# Patient Record
Sex: Male | Born: 1955 | Race: White | Hispanic: No | Marital: Married | Smoking: Former smoker
Health system: Southern US, Community
[De-identification: ages and names within clinical notes are randomized; demographics above are authoritative.]

## PROBLEM LIST (undated history)

## (undated) DIAGNOSIS — G4733 Obstructive sleep apnea (adult) (pediatric): Secondary | ICD-10-CM

## (undated) DIAGNOSIS — Z8719 Personal history of other diseases of the digestive system: Secondary | ICD-10-CM

## (undated) DIAGNOSIS — K219 Gastro-esophageal reflux disease without esophagitis: Secondary | ICD-10-CM

## (undated) DIAGNOSIS — H269 Unspecified cataract: Secondary | ICD-10-CM

## (undated) DIAGNOSIS — F32A Depression, unspecified: Secondary | ICD-10-CM

## (undated) DIAGNOSIS — M51369 Other intervertebral disc degeneration, lumbar region without mention of lumbar back pain or lower extremity pain: Secondary | ICD-10-CM

## (undated) DIAGNOSIS — G43909 Migraine, unspecified, not intractable, without status migrainosus: Secondary | ICD-10-CM

## (undated) DIAGNOSIS — E785 Hyperlipidemia, unspecified: Secondary | ICD-10-CM

## (undated) DIAGNOSIS — I719 Aortic aneurysm of unspecified site, without rupture: Secondary | ICD-10-CM

## (undated) DIAGNOSIS — M5136 Other intervertebral disc degeneration, lumbar region: Secondary | ICD-10-CM

## (undated) DIAGNOSIS — M1990 Unspecified osteoarthritis, unspecified site: Secondary | ICD-10-CM

## (undated) DIAGNOSIS — N329 Bladder disorder, unspecified: Secondary | ICD-10-CM

## (undated) DIAGNOSIS — G4730 Sleep apnea, unspecified: Secondary | ICD-10-CM

## (undated) DIAGNOSIS — M25569 Pain in unspecified knee: Secondary | ICD-10-CM

## (undated) DIAGNOSIS — I10 Essential (primary) hypertension: Secondary | ICD-10-CM

## (undated) DIAGNOSIS — M5126 Other intervertebral disc displacement, lumbar region: Secondary | ICD-10-CM

## (undated) DIAGNOSIS — D649 Anemia, unspecified: Secondary | ICD-10-CM

## (undated) DIAGNOSIS — F419 Anxiety disorder, unspecified: Secondary | ICD-10-CM

## (undated) DIAGNOSIS — R739 Hyperglycemia, unspecified: Secondary | ICD-10-CM

## (undated) HISTORY — DX: Depression, unspecified: F32.A

## (undated) HISTORY — DX: Sleep apnea, unspecified: G47.30

## (undated) HISTORY — DX: Unspecified osteoarthritis, unspecified site: M19.90

## (undated) HISTORY — DX: Hyperglycemia, unspecified: R73.9

## (undated) HISTORY — DX: Obstructive sleep apnea (adult) (pediatric): G47.33

## (undated) HISTORY — PX: ESOPHAGOGASTRODUODENOSCOPY: SHX1529

## (undated) HISTORY — DX: Gastro-esophageal reflux disease without esophagitis: K21.9

## (undated) HISTORY — DX: Aortic aneurysm of unspecified site, without rupture: I71.9

## (undated) HISTORY — DX: Unspecified cataract: H26.9

## (undated) HISTORY — DX: Anxiety disorder, unspecified: F41.9

## (undated) HISTORY — PX: SPINE SURGERY: SHX786

## (undated) HISTORY — DX: Anemia, unspecified: D64.9

## (undated) HISTORY — PX: ESOPHAGOGASTRODUODENOSCOPY (EGD) WITH ESOPHAGEAL DILATION: SHX5812

## (undated) HISTORY — PX: NASAL SEPTUM SURGERY: SHX37

## (undated) HISTORY — PX: 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): SHX6458

## (undated) HISTORY — PX: JOINT REPLACEMENT: SHX530

---

## 1993-08-23 HISTORY — PX: KNEE ARTHROSCOPY: SHX127

## 2001-09-16 LAB — PULMONARY FUNCTION TEST

## 2011-09-30 ENCOUNTER — Other Ambulatory Visit: Payer: Self-pay | Admitting: Orthopedic Surgery

## 2011-10-01 ENCOUNTER — Encounter (HOSPITAL_COMMUNITY): Payer: Self-pay

## 2011-10-01 ENCOUNTER — Encounter (HOSPITAL_COMMUNITY): Payer: Self-pay | Admitting: Pharmacy Technician

## 2011-10-01 ENCOUNTER — Encounter (HOSPITAL_COMMUNITY)
Admission: RE | Admit: 2011-10-01 | Discharge: 2011-10-01 | Disposition: A | Discharge status: Home / Self Care | Payer: BC Managed Care – PPO | Source: Ambulatory Visit | Attending: Orthopedic Surgery | Admitting: Orthopedic Surgery

## 2011-10-01 HISTORY — DX: Hyperlipidemia, unspecified: E78.5

## 2011-10-01 HISTORY — DX: Pain in unspecified knee: M25.569

## 2011-10-01 HISTORY — DX: Essential (primary) hypertension: I10

## 2011-10-01 LAB — URINALYSIS, ROUTINE W REFLEX MICROSCOPIC
Bilirubin Urine: NEGATIVE
Glucose, UA: NEGATIVE mg/dL
Hgb urine dipstick: NEGATIVE
Ketones, ur: NEGATIVE mg/dL
Specific Gravity, Urine: 1.02 (ref 1.005–1.030)
pH: 5 (ref 5.0–8.0)

## 2011-10-01 LAB — CBC
HCT: 39.9 % (ref 39.0–52.0)
Hemoglobin: 13.5 g/dL (ref 13.0–17.0)
MCHC: 33.8 g/dL (ref 30.0–36.0)
RDW: 13.2 % (ref 11.5–15.5)
WBC: 8.8 10*3/uL (ref 4.0–10.5)

## 2011-10-01 LAB — DIFFERENTIAL
Basophils Relative: 1 % (ref 0–1)
Eosinophils Absolute: 0.3 10*3/uL (ref 0.0–0.7)
Eosinophils Relative: 3 % (ref 0–5)
Lymphs Abs: 2.8 10*3/uL (ref 0.7–4.0)
Monocytes Relative: 8 % (ref 3–12)
Neutrophils Relative %: 57 % (ref 43–77)

## 2011-10-01 LAB — COMPREHENSIVE METABOLIC PANEL
ALT: 21 U/L (ref 0–53)
Albumin: 3.6 g/dL (ref 3.5–5.2)
Alkaline Phosphatase: 60 U/L (ref 39–117)
BUN: 19 mg/dL (ref 6–23)
Chloride: 107 mEq/L (ref 96–112)
Glucose, Bld: 93 mg/dL (ref 70–99)
Potassium: 4 mEq/L (ref 3.5–5.1)
Sodium: 142 mEq/L (ref 135–145)
Total Bilirubin: 0.2 mg/dL — ABNORMAL LOW (ref 0.3–1.2)
Total Protein: 6.6 g/dL (ref 6.0–8.3)

## 2011-10-01 LAB — SURGICAL PCR SCREEN: MRSA, PCR: NEGATIVE

## 2011-10-01 LAB — APTT: aPTT: 30 seconds (ref 24–37)

## 2011-10-01 MED ORDER — CHLORHEXIDINE GLUCONATE 4 % EX LIQD: 60.0000 mL | Freq: Once | CUTANEOUS | Status: DC

## 2011-10-01 NOTE — Pre-Procedure Instructions (Signed)
20 Jeremy [name]  10/01/2011   Your procedure is scheduled on:  Oct 11, 2011 (Monday)  Report to [facility] at 0645 AM.  Call this number if you have problems the morning of surgery: [phone]   Remember:   Do not eat food:After Midnight.  May have clear liquids: up to 4 Hours before arrival.  Clear liquids include soda, tea, black coffee, apple or grape juice, broth.  Take these medicines the morning of surgery with A SIP OF WATER: allegra   Do not wear jewelry, make-up or nail polish.  Do not wear lotions, powders, or perfumes. You may wear deodorant.  Do not shave 48 hours prior to surgery.  Do not bring valuables to the hospital.  Contacts, dentures or bridgework may not be worn into surgery.  Leave suitcase in the car. After surgery it may be brought to your room.  For patients admitted to the hospital, checkout time is 11:00 AM the day of discharge.   Patients discharged the day of surgery will not be allowed to drive home.  Name and phone number of your driver: NA  Special Instructions: Incentive Spirometry - Practice and bring it with you on the day of surgery. and CHG Shower Use Special Wash: 1/2 bottle night before surgery and 1/2 bottle morning of surgery.   Please read over the following fact sheets that you were given: Pain Booklet, Blood Transfusion Information, Total Joint Packet, MRSA Information and Surgical Site Infection Prevention

## 2011-10-02 LAB — URINE CULTURE
Colony Count: NO GROWTH
Culture  Setup Time: 201302082127
Culture: NO GROWTH

## 2011-10-04 NOTE — Progress Notes (Signed)
10/04/11    0740  Inside chart there are : 1)  1 view chest xray  Dated 530/2012   2) Gated Spect rest/stress test dated 01/21/2011     3) EKG 01/20/2011     4) Nuclear Cardiac Imaging  01/21/2011            Will need  2  V  CXR    DOS               DA

## 2011-10-06 ENCOUNTER — Other Ambulatory Visit: Payer: Self-pay | Admitting: Orthopedic Surgery

## 2011-10-06 NOTE — H&P (Signed)
  Jeremy [name] MRN:  [id number] DOB/SEX:  07/20/1956/male  CHIEF COMPLAINT:  Painful right Knee  HISTORY: Patient is a 56 y.o. male presented with a history of pain in the right knee. Onset of symptoms was gradual starting several years ago with gradually worsening course since that time. The patient noted no past surgery on the right knee. Prior procedures on the knee include meniscectomy. Patient has been treated conservatively with over-the-counter NSAIDs and activity modification. Patient currently rates pain in the knee at 8 out of 10 with activity. There is pain at night.  PAST MEDICAL HISTORY: There are no active problems to display for this patient.  Past Medical History  Diagnosis Date  . Hypertension   . Hyperlipemia   . Joint pain, knee   . Nocturia    Past Surgical History  Procedure Date  . Nose surgery   . Knee arthroscopy 1995  . Cardiac catheterization      MEDICATIONS:   (Not in a hospital admission)  ALLERGIES:  No Known Allergies  REVIEW OF SYSTEMS:  Pertinent items are noted in HPI.   FAMILY HISTORY:  No family history on file.  SOCIAL HISTORY:   History  Substance Use Topics  . Smoking status: Former Smoker  . Smokeless tobacco: Not on file  . Alcohol Use: No     EXAMINATION:  Vital signs in last 24 hours: @VSRANGES@  General appearance: alert, cooperative and no distress Lungs: clear to auscultation bilaterally Heart: regular rate and rhythm, S1, S2 normal, no murmur, click, rub or gallop Abdomen: soft, non-tender; bowel sounds normal; no masses,  no organomegaly Extremities: extremities normal, atraumatic, no cyanosis or edema and Homans sign is negative, no sign of DVT Pulses: 2+ and symmetric Skin: Skin color, texture, turgor normal. No rashes or lesions Neurologic: Alert and oriented X 3, normal strength and tone. Normal symmetric reflexes. Normal coordination and gait  Musculoskeletal:  ROM 0-115, Ligaments INTACT,  Imaging  Review Plain radiographs demonstrate severe degenerative joint disease of the right knee. The overall alignment is mild varus. The bone quality appears to be good for age and reported activity level.  Assessment/Plan: End stage arthritis, right knee   The patient history, physical examination and imaging studies are consistent with advanced degenerative joint disease of the right knee. The patient has failed conservative treatment.  The clearance notes were reviewed.  After discussion with the patient it was felt that Total Knee Replacement was indicated. The procedure,  risks, and benefits of total knee arthroplasty were presented and reviewed. The risks including but not limited to aseptic loosening, infection, blood clots, vascular injury, stiffness, patella tracking problems complications among others were discussed. The patient acknowledged the explanation, agreed to proceed with the plan.  Jeremy [name] 10/06/2011, 9:32 AM   

## 2011-10-10 MED ORDER — CEFAZOLIN SODIUM-DEXTROSE 2-3 GM-% IV SOLR
2.0000 g | INTRAVENOUS | Status: AC
  Administered 2011-10-11: 2 g via INTRAVENOUS
  Filled 2011-10-10: qty 50

## 2011-10-10 MED ORDER — ACETAMINOPHEN 10 MG/ML IV SOLN
1000.0000 mg | Freq: Four times a day (QID) | INTRAVENOUS | Status: DC
  Administered 2011-10-11: 1000 mg via INTRAVENOUS
  Filled 2011-10-10 (×4): qty 100

## 2011-10-11 ENCOUNTER — Encounter (HOSPITAL_COMMUNITY): Payer: Self-pay | Admitting: Anesthesiology

## 2011-10-11 ENCOUNTER — Inpatient Hospital Stay (HOSPITAL_COMMUNITY)
Admission: RE | Admit: 2011-10-11 | Discharge: 2011-10-13 | DRG: 209 | Disposition: A | Discharge status: Home / Self Care | Payer: BC Managed Care – PPO | Source: Ambulatory Visit | Attending: Orthopedic Surgery | Admitting: Orthopedic Surgery

## 2011-10-11 ENCOUNTER — Ambulatory Visit (HOSPITAL_COMMUNITY): Payer: BC Managed Care – PPO

## 2011-10-11 ENCOUNTER — Encounter (HOSPITAL_COMMUNITY)
Admission: RE | Disposition: A | Discharge status: Home / Self Care | Payer: Self-pay | Source: Ambulatory Visit | Attending: Orthopedic Surgery

## 2011-10-11 ENCOUNTER — Encounter (HOSPITAL_COMMUNITY): Payer: Self-pay

## 2011-10-11 ENCOUNTER — Ambulatory Visit (HOSPITAL_COMMUNITY): Payer: BC Managed Care – PPO | Admitting: Anesthesiology

## 2011-10-11 DIAGNOSIS — D62 Acute posthemorrhagic anemia: Secondary | ICD-10-CM | POA: Diagnosis not present

## 2011-10-11 DIAGNOSIS — Z87891 Personal history of nicotine dependence: Secondary | ICD-10-CM

## 2011-10-11 DIAGNOSIS — M1710 Unilateral primary osteoarthritis, unspecified knee: Principal | ICD-10-CM | POA: Diagnosis present

## 2011-10-11 DIAGNOSIS — M1711 Unilateral primary osteoarthritis, right knee: Secondary | ICD-10-CM

## 2011-10-11 DIAGNOSIS — I10 Essential (primary) hypertension: Secondary | ICD-10-CM | POA: Diagnosis present

## 2011-10-11 HISTORY — PX: TOTAL KNEE ARTHROPLASTY: SHX125

## 2011-10-11 LAB — ABO/RH: ABO/RH(D): O POS

## 2011-10-11 LAB — TYPE AND SCREEN: Antibody Screen: NEGATIVE

## 2011-10-11 SURGERY — ARTHROPLASTY, KNEE, TOTAL
Anesthesia: Regional | Site: Knee | Laterality: Right | Wound class: Clean

## 2011-10-11 MED ORDER — SENNOSIDES-DOCUSATE SODIUM 8.6-50 MG PO TABS: 1.0000 | ORAL_TABLET | Freq: Every evening | ORAL | Status: DC | PRN

## 2011-10-11 MED ORDER — AMLODIPINE BESYLATE 5 MG PO TABS
5.0000 mg | ORAL_TABLET | Freq: Every day | ORAL | Status: DC
  Administered 2011-10-11 – 2011-10-13 (×3): 5 mg via ORAL
  Filled 2011-10-11 (×3): qty 1

## 2011-10-11 MED ORDER — FENTANYL CITRATE 0.05 MG/ML IJ SOLN
INTRAMUSCULAR | Status: DC | PRN
  Administered 2011-10-11 (×2): 25 ug via INTRAVENOUS
  Administered 2011-10-11 (×2): 100 ug via INTRAVENOUS
  Administered 2011-10-11: 25 ug via INTRAVENOUS
  Administered 2011-10-11: 75 ug via INTRAVENOUS
  Administered 2011-10-11 (×2): 25 ug via INTRAVENOUS

## 2011-10-11 MED ORDER — METHOCARBAMOL 100 MG/ML IJ SOLN
500.0000 mg | Freq: Four times a day (QID) | INTRAVENOUS | Status: DC | PRN
  Filled 2011-10-11: qty 5

## 2011-10-11 MED ORDER — MENTHOL 3 MG MT LOZG: 1.0000 | LOZENGE | OROMUCOSAL | Status: DC | PRN

## 2011-10-11 MED ORDER — SODIUM CHLORIDE 0.9 % IR SOLN
Status: DC | PRN
  Administered 2011-10-11: 1000 mL

## 2011-10-11 MED ORDER — FLEET ENEMA 7-19 GM/118ML RE ENEM: 1.0000 | ENEMA | Freq: Once | RECTAL | Status: AC | PRN

## 2011-10-11 MED ORDER — METOCLOPRAMIDE HCL 10 MG PO TABS: 5.0000 mg | ORAL_TABLET | Freq: Three times a day (TID) | ORAL | Status: DC | PRN

## 2011-10-11 MED ORDER — PHENOL 1.4 % MT LIQD
1.0000 | OROMUCOSAL | Status: DC | PRN
  Filled 2011-10-11: qty 177

## 2011-10-11 MED ORDER — BISACODYL 5 MG PO TBEC: 5.0000 mg | DELAYED_RELEASE_TABLET | Freq: Every day | ORAL | Status: DC | PRN

## 2011-10-11 MED ORDER — HYDROMORPHONE HCL PF 1 MG/ML IJ SOLN
0.5000 mg | INTRAMUSCULAR | Status: DC | PRN
  Administered 2011-10-11 – 2011-10-13 (×10): 1 mg via INTRAVENOUS
  Filled 2011-10-11 (×11): qty 1

## 2011-10-11 MED ORDER — ALUM & MAG HYDROXIDE-SIMETH 200-200-20 MG/5ML PO SUSP: 30.0000 mL | ORAL | Status: DC | PRN

## 2011-10-11 MED ORDER — METOCLOPRAMIDE HCL 5 MG/ML IJ SOLN
5.0000 mg | Freq: Three times a day (TID) | INTRAMUSCULAR | Status: DC | PRN
  Filled 2011-10-11: qty 2

## 2011-10-11 MED ORDER — ONDANSETRON HCL 4 MG/2ML IJ SOLN
4.0000 mg | Freq: Four times a day (QID) | INTRAMUSCULAR | Status: DC | PRN
  Administered 2011-10-11 – 2011-10-12 (×2): 4 mg via INTRAVENOUS
  Filled 2011-10-11 (×2): qty 2

## 2011-10-11 MED ORDER — ZOLPIDEM TARTRATE 5 MG PO TABS: 5.0000 mg | ORAL_TABLET | Freq: Every evening | ORAL | Status: DC | PRN

## 2011-10-11 MED ORDER — ACETAMINOPHEN 10 MG/ML IV SOLN
INTRAVENOUS | Status: AC
  Filled 2011-10-11: qty 100

## 2011-10-11 MED ORDER — HYDROMORPHONE HCL PF 1 MG/ML IJ SOLN
INTRAMUSCULAR | Status: AC
  Administered 2011-10-11: 1 mg via INTRAVENOUS
  Filled 2011-10-11: qty 1

## 2011-10-11 MED ORDER — BUPIVACAINE ON-Q PAIN PUMP (FOR ORDER SET NO CHG)
INJECTION | Status: DC
  Filled 2011-10-11: qty 1

## 2011-10-11 MED ORDER — BUPIVACAINE-EPINEPHRINE 0.25% -1:200000 IJ SOLN
INTRAMUSCULAR | Status: DC | PRN
  Administered 2011-10-11: 30 mL

## 2011-10-11 MED ORDER — ONDANSETRON HCL 4 MG PO TABS: 4.0000 mg | ORAL_TABLET | Freq: Four times a day (QID) | ORAL | Status: DC | PRN

## 2011-10-11 MED ORDER — LACTATED RINGERS IV SOLN
INTRAVENOUS | Status: DC | PRN
  Administered 2011-10-11 (×2): via INTRAVENOUS

## 2011-10-11 MED ORDER — ACETAMINOPHEN 650 MG RE SUPP: 650.0000 mg | Freq: Four times a day (QID) | RECTAL | Status: DC | PRN

## 2011-10-11 MED ORDER — SODIUM CHLORIDE 0.9 % IV SOLN: INTRAVENOUS | Status: DC

## 2011-10-11 MED ORDER — BUPIVACAINE 0.25 % ON-Q PUMP SINGLE CATH 300ML
300.0000 mL | INJECTION | Status: AC
  Administered 2011-10-11: 300 mL
  Filled 2011-10-11: qty 300

## 2011-10-11 MED ORDER — AMLODIPINE BESY-BENAZEPRIL HCL 5-20 MG PO CAPS: 1.0000 | ORAL_CAPSULE | Freq: Every day | ORAL | Status: DC

## 2011-10-11 MED ORDER — ONDANSETRON HCL 4 MG/2ML IJ SOLN
INTRAMUSCULAR | Status: DC | PRN
  Administered 2011-10-11: 4 mg via INTRAVENOUS

## 2011-10-11 MED ORDER — CELECOXIB 200 MG PO CAPS
200.0000 mg | ORAL_CAPSULE | Freq: Two times a day (BID) | ORAL | Status: DC
  Administered 2011-10-11 – 2011-10-13 (×5): 200 mg via ORAL
  Filled 2011-10-11 (×6): qty 1

## 2011-10-11 MED ORDER — DROPERIDOL 2.5 MG/ML IJ SOLN: 0.6250 mg | INTRAMUSCULAR | Status: DC | PRN

## 2011-10-11 MED ORDER — ACETAMINOPHEN 325 MG PO TABS: 650.0000 mg | ORAL_TABLET | Freq: Four times a day (QID) | ORAL | Status: DC | PRN

## 2011-10-11 MED ORDER — LORATADINE 10 MG PO TABS
10.0000 mg | ORAL_TABLET | Freq: Every day | ORAL | Status: DC
  Administered 2011-10-11 – 2011-10-13 (×3): 10 mg via ORAL
  Filled 2011-10-11 (×3): qty 1

## 2011-10-11 MED ORDER — METHOCARBAMOL 500 MG PO TABS
500.0000 mg | ORAL_TABLET | Freq: Four times a day (QID) | ORAL | Status: DC | PRN
  Administered 2011-10-11 – 2011-10-13 (×6): 500 mg via ORAL
  Filled 2011-10-11 (×6): qty 1

## 2011-10-11 MED ORDER — OXYCODONE HCL 5 MG PO TABS
5.0000 mg | ORAL_TABLET | ORAL | Status: DC | PRN
  Administered 2011-10-12 – 2011-10-13 (×5): 10 mg via ORAL
  Filled 2011-10-11 (×5): qty 2

## 2011-10-11 MED ORDER — DOCUSATE SODIUM 100 MG PO CAPS
100.0000 mg | ORAL_CAPSULE | Freq: Two times a day (BID) | ORAL | Status: DC
  Administered 2011-10-11 – 2011-10-13 (×5): 100 mg via ORAL
  Filled 2011-10-11 (×6): qty 1

## 2011-10-11 MED ORDER — OXYCODONE HCL 10 MG PO TB12
10.0000 mg | ORAL_TABLET | Freq: Two times a day (BID) | ORAL | Status: DC
  Administered 2011-10-11 (×2): 10 mg via ORAL
  Filled 2011-10-11 (×2): qty 1

## 2011-10-11 MED ORDER — ENOXAPARIN SODIUM 40 MG/0.4ML ~~LOC~~ SOLN
40.0000 mg | SUBCUTANEOUS | Status: DC
  Administered 2011-10-12 (×2): 40 mg via SUBCUTANEOUS
  Filled 2011-10-11 (×3): qty 0.4

## 2011-10-11 MED ORDER — MIDAZOLAM HCL 5 MG/5ML IJ SOLN
INTRAMUSCULAR | Status: DC | PRN
  Administered 2011-10-11: 2 mg via INTRAVENOUS

## 2011-10-11 MED ORDER — HYDROCHLOROTHIAZIDE 12.5 MG PO CAPS
12.5000 mg | ORAL_CAPSULE | Freq: Every day | ORAL | Status: DC
Start: 2011-10-11 — End: 2011-10-13
  Administered 2011-10-11 – 2011-10-13 (×3): 12.5 mg via ORAL
  Filled 2011-10-11 (×3): qty 1

## 2011-10-11 MED ORDER — HYDROMORPHONE HCL PF 1 MG/ML IJ SOLN
0.2500 mg | INTRAMUSCULAR | Status: DC | PRN
  Administered 2011-10-11 (×4): 0.5 mg via INTRAVENOUS

## 2011-10-11 MED ORDER — BENAZEPRIL HCL 20 MG PO TABS
20.0000 mg | ORAL_TABLET | Freq: Every day | ORAL | Status: DC
  Administered 2011-10-11 – 2011-10-13 (×3): 20 mg via ORAL
  Filled 2011-10-11 (×3): qty 1

## 2011-10-11 MED ORDER — DIPHENHYDRAMINE HCL 12.5 MG/5ML PO ELIX
12.5000 mg | ORAL_SOLUTION | ORAL | Status: DC | PRN
  Filled 2011-10-11: qty 10

## 2011-10-11 MED ORDER — CEFAZOLIN SODIUM-DEXTROSE 2-3 GM-% IV SOLR
2.0000 g | Freq: Four times a day (QID) | INTRAVENOUS | Status: AC
  Administered 2011-10-11 – 2011-10-12 (×3): 2 g via INTRAVENOUS
  Filled 2011-10-11 (×3): qty 50

## 2011-10-11 MED ORDER — PROPOFOL 10 MG/ML IV EMUL
INTRAVENOUS | Status: DC | PRN
  Administered 2011-10-11: 200 mg via INTRAVENOUS

## 2011-10-11 SURGICAL SUPPLY — 59 items
BANDAGE ESMARK 6X9 LF (GAUZE/BANDAGES/DRESSINGS) ×1 IMPLANT
BLADE SAGITTAL 13X1.27X60 (BLADE) ×2 IMPLANT
BLADE SAW SGTL 83.5X18.5 (BLADE) ×2 IMPLANT
BNDG ESMARK 6X9 LF (GAUZE/BANDAGES/DRESSINGS) ×2
BOWL SMART MIX CTS (DISPOSABLE) ×2 IMPLANT
CATH KIT ON Q 10IN SLV (PAIN MANAGEMENT) IMPLANT
CEMENT BONE SIMPLEX SPEEDSET (Cement) ×2 IMPLANT
CLOSURE STERI STRIP 1/2 X4 (GAUZE/BANDAGES/DRESSINGS) ×2 IMPLANT
CLOTH BEACON ORANGE TIMEOUT ST (SAFETY) ×2 IMPLANT
COVER BACK TABLE 24X17X13 BIG (DRAPES) ×2 IMPLANT
COVER SURGICAL LIGHT HANDLE (MISCELLANEOUS) ×2 IMPLANT
CUFF TOURNIQUET SINGLE 34IN LL (TOURNIQUET CUFF) ×2 IMPLANT
DRAPE EXTREMITY T 121X128X90 (DRAPE) ×2 IMPLANT
DRAPE INCISE IOBAN 66X45 STRL (DRAPES) ×4 IMPLANT
DRAPE PROXIMA HALF (DRAPES) ×4 IMPLANT
DRAPE U-SHAPE 47X51 STRL (DRAPES) ×2 IMPLANT
DRSG ADAPTIC 3X8 NADH LF (GAUZE/BANDAGES/DRESSINGS) ×2 IMPLANT
DRSG PAD ABDOMINAL 8X10 ST (GAUZE/BANDAGES/DRESSINGS) ×2 IMPLANT
DURAPREP 26ML APPLICATOR (WOUND CARE) ×4 IMPLANT
ELECT REM PT RETURN 9FT ADLT (ELECTROSURGICAL) ×2
ELECTRODE REM PT RTRN 9FT ADLT (ELECTROSURGICAL) ×1 IMPLANT
EVACUATOR 1/8 PVC DRAIN (DRAIN) ×2 IMPLANT
GLOVE BIOGEL M 7.0 STRL (GLOVE) ×2 IMPLANT
GLOVE BIOGEL PI IND STRL 7.5 (GLOVE) ×1 IMPLANT
GLOVE BIOGEL PI IND STRL 8.5 (GLOVE) ×2 IMPLANT
GLOVE BIOGEL PI INDICATOR 7.5 (GLOVE) ×1
GLOVE BIOGEL PI INDICATOR 8.5 (GLOVE) ×2
GLOVE SURG ORTHO 8.0 STRL STRW (GLOVE) ×4 IMPLANT
GOWN PREVENTION PLUS XLARGE (GOWN DISPOSABLE) ×4 IMPLANT
GOWN STRL NON-REIN LRG LVL3 (GOWN DISPOSABLE) ×4 IMPLANT
HANDPIECE INTERPULSE COAX TIP (DISPOSABLE) ×1
HOOD PEEL AWAY FACE SHEILD DIS (HOOD) ×8 IMPLANT
KIT BASIN OR (CUSTOM PROCEDURE TRAY) ×2 IMPLANT
KIT ROOM TURNOVER OR (KITS) ×2 IMPLANT
MANIFOLD NEPTUNE II (INSTRUMENTS) ×2 IMPLANT
NEEDLE 22X1 1/2 (OR ONLY) (NEEDLE) ×2 IMPLANT
NS IRRIG 1000ML POUR BTL (IV SOLUTION) ×2 IMPLANT
ON Q SILVER SOAKER 5IN ×2 IMPLANT
PACK TOTAL JOINT (CUSTOM PROCEDURE TRAY) ×2 IMPLANT
PAD ARMBOARD 7.5X6 YLW CONV (MISCELLANEOUS) ×4 IMPLANT
PADDING CAST COTTON 6X4 STRL (CAST SUPPLIES) ×2 IMPLANT
PADDING WEBRIL 6 STERILE (GAUZE/BANDAGES/DRESSINGS) ×2 IMPLANT
POSITIONER HEAD PRONE TRACH (MISCELLANEOUS) ×2 IMPLANT
SET HNDPC FAN SPRY TIP SCT (DISPOSABLE) ×1 IMPLANT
SPONGE GAUZE 4X4 12PLY (GAUZE/BANDAGES/DRESSINGS) ×2 IMPLANT
SPONGE GAUZE 4X4 STERILE 39 (GAUZE/BANDAGES/DRESSINGS) ×2 IMPLANT
STAPLER VISISTAT 35W (STAPLE) ×2 IMPLANT
SUCTION FRAZIER TIP 10 FR DISP (SUCTIONS) ×2 IMPLANT
SUT BONE WAX W31G (SUTURE) ×2 IMPLANT
SUT VIC AB 0 CTB1 27 (SUTURE) ×4 IMPLANT
SUT VIC AB 1 CT1 27 (SUTURE) ×4
SUT VIC AB 1 CT1 27XBRD ANBCTR (SUTURE) ×4 IMPLANT
SUT VIC AB 2-0 CT1 27 (SUTURE) ×2
SUT VIC AB 2-0 CT1 TAPERPNT 27 (SUTURE) ×2 IMPLANT
SYR CONTROL 10ML LL (SYRINGE) ×2 IMPLANT
TOWEL OR 17X24 6PK STRL BLUE (TOWEL DISPOSABLE) ×2 IMPLANT
TOWEL OR 17X26 10 PK STRL BLUE (TOWEL DISPOSABLE) ×2 IMPLANT
TRAY FOLEY CATH 14FR (SET/KITS/TRAYS/PACK) IMPLANT
WATER STERILE IRR 1000ML POUR (IV SOLUTION) ×2 IMPLANT

## 2011-10-11 NOTE — H&P (View-Only) (Signed)
  Jeremy [name] MRN:  [id number] DOB/SEX:  [birth year]/male  CHIEF COMPLAINT:  Painful right Knee  HISTORY: Patient is a 56 y.o. male presented with a history of pain in the right knee. Onset of symptoms was gradual starting several years ago with gradually worsening course since that time. The patient noted no past surgery on the right knee. Prior procedures on the knee include meniscectomy. Patient has been treated conservatively with over-the-counter NSAIDs and activity modification. Patient currently rates pain in the knee at 8 out of 10 with activity. There is pain at night.  PAST MEDICAL HISTORY: There are no active problems to display for this patient.  Past Medical History  Diagnosis Date  . Hypertension   . Hyperlipemia   . Joint pain, knee   . Nocturia    Past Surgical History  Procedure Date  . Nose surgery   . Knee arthroscopy 1995  . Cardiac catheterization      MEDICATIONS:   (Not in a hospital admission)  ALLERGIES:  No Known Allergies  REVIEW OF SYSTEMS:  Pertinent items are noted in HPI.   FAMILY HISTORY:  No family history on file.  SOCIAL HISTORY:   History  Substance Use Topics  . Smoking status: Former Games developer  . Smokeless tobacco: Not on file  . Alcohol Use: No     EXAMINATION:  Vital signs in last 24 hours: @VSRANGES @  General appearance: alert, cooperative and no distress Lungs: clear to auscultation bilaterally Heart: regular rate and rhythm, S1, S2 normal, no murmur, click, rub or gallop Abdomen: soft, non-tender; bowel sounds normal; no masses,  no organomegaly Extremities: extremities normal, atraumatic, no cyanosis or edema and Homans sign is negative, no sign of DVT Pulses: 2+ and symmetric Skin: Skin color, texture, turgor normal. No rashes or lesions Neurologic: Alert and oriented X 3, normal strength and tone. Normal symmetric reflexes. Normal coordination and gait  Musculoskeletal:  ROM 0-115, Ligaments INTACT,  Imaging  Review Plain radiographs demonstrate severe degenerative joint disease of the right knee. The overall alignment is mild varus. The bone quality appears to be good for age and reported activity level.  Assessment/Plan: End stage arthritis, right knee   The patient history, physical examination and imaging studies are consistent with advanced degenerative joint disease of the right knee. The patient has failed conservative treatment.  The clearance notes were reviewed.  After discussion with the patient it was felt that Total Knee Replacement was indicated. The procedure,  risks, and benefits of total knee arthroplasty were presented and reviewed. The risks including but not limited to aseptic loosening, infection, blood clots, vascular injury, stiffness, patella tracking problems complications among others were discussed. The patient acknowledged the explanation, agreed to proceed with the plan.  Robt Okuda 10/06/2011, 9:32 AM

## 2011-10-11 NOTE — Progress Notes (Signed)
Orthopedic Tech Progress Note Patient Details:  Jeremy [name] 04/13/1956 [id number]  CPM Right Knee CPM Right Knee: On Right Knee Flexion (Degrees): 90  Right Knee Extension (Degrees): 0    [name] 10/11/2011, 12:17 PM

## 2011-10-11 NOTE — Anesthesia Postprocedure Evaluation (Signed)
Anesthesia Post Note  Patient: Jeremy [name]  Procedure(s) Performed: Procedure(s) (LRB): TOTAL KNEE ARTHROPLASTY (Right)  Anesthesia type: general  Patient location: PACU  Post pain: Pain level controlled  Post assessment: Patient's Cardiovascular Status Stable  Last Vitals:  Filed Vitals:   10/11/11 1145  BP: 155/96  Pulse: 77  Temp:   Resp: 7    Post vital signs: Reviewed and stable  Level of consciousness: sedated  Complications: No apparent anesthesia complications

## 2011-10-11 NOTE — Interval H&P Note (Signed)
History and Physical Interval Note:  10/11/2011 7:15 AM  Jeremy [name]  has presented today for surgery, with the diagnosis of osteoarthritis right knee  The various methods of treatment have been discussed with the patient and family. After consideration of risks, benefits and other options for treatment, the patient has consented to  Procedure(s) (LRB): TOTAL KNEE ARTHROPLASTY (Right) as a surgical intervention .  The patients' history has been reviewed, patient examined, no change in status, stable for surgery.  I have reviewed the patients' chart and labs.  Questions were answered to the patient's satisfaction.     Sydney Hasten,STEPHEN D

## 2011-10-11 NOTE — Anesthesia Procedure Notes (Addendum)
Anesthesia Regional Block:  Femoral nerve block  Pre-Anesthetic Checklist: ,, timeout performed, Correct Patient, Correct Site, Correct Laterality, Correct Procedure,, site marked, risks and benefits discussed, Surgical consent,  Pre-op evaluation,  At surgeon's request and post-op pain management  Laterality: Right  Prep: chloraprep       Needles:  Injection technique: Single-shot  Needle Type: Echogenic Stimulator Needle     Needle Length: 5cm 5 cm Needle Gauge: 22 and 22 G    Additional Needles:  Procedures: ultrasound guided and nerve stimulator Femoral nerve block  Nerve Stimulator or Paresthesia:  Response: quadraceps contraction, 0.45 mA,   Additional Responses:   Narrative:  Start time: 10/11/2011 8:11 AM End time: 10/11/2011 8:21 AM Injection made incrementally with aspirations every 5 mL.  Performed by: Personally  Anesthesiologist: Halford Decamp, MD  Additional Notes: Functioning IV was confirmed and monitors were applied.  A 50mm 22ga Arrow echogenic stimulator needle was used. Sterile prep and drape,hand hygiene and sterile gloves were used. Ultrasound guidance: relevent anatomy identified, needle position confirmed, local anesthetic spread visualized around nerve(s)., vascular puncture avoided.  Image printed for medical record. Negative aspiration and negative test dose prior to incremental administration of local anesthetic. The patient tolerated the procedure well.    Femoral nerve block Procedure Name: LMA Insertion Date/Time: 10/11/2011 8:55 AM Performed by: Margaree Mackintosh Pre-anesthesia Checklist: Patient identified, Timeout performed, Emergency Drugs available, Suction available and Patient being monitored Patient Re-evaluated:Patient Re-evaluated prior to inductionOxygen Delivery Method: Circle System Utilized Preoxygenation: Pre-oxygenation with 100% oxygen Intubation Type: IV induction LMA: LMA inserted LMA Size: 4.0 Number of attempts:  1 Placement Confirmation: positive ETCO2 and breath sounds checked- equal and bilateral Tube secured with: Tape Dental Injury: Teeth and Oropharynx as per pre-operative assessment

## 2011-10-11 NOTE — Transfer of Care (Signed)
Immediate Anesthesia Transfer of Care Note  Patient: Jeremy [name]  Procedure(s) Performed: Procedure(s) (LRB): TOTAL KNEE ARTHROPLASTY (Right)  Patient Location: PACU  Anesthesia Type: GA combined with regional for post-op pain  Level of Consciousness: awake  Airway & Oxygen Therapy: Patient Spontanous Breathing and Patient connected to nasal cannula oxygen  Post-op Assessment: Report given to PACU RN and Post -op Vital signs reviewed and stable  Post vital signs: Reviewed  Complications: No apparent anesthesia complications

## 2011-10-11 NOTE — Preoperative (Signed)
Beta Blockers   Reason not to administer Beta Blockers:Not Applicable. No home beta blockers 

## 2011-10-11 NOTE — Anesthesia Preprocedure Evaluation (Addendum)
Anesthesia Evaluation  Patient identified by MRN, date of birth, ID band Patient awake    Reviewed: Allergy & Precautions, H&P , NPO status , Patient's Chart, lab work & pertinent test results, reviewed documented beta blocker date and time   History of Anesthesia Complications Negative for: history of anesthetic complications  Airway Mallampati: I      Dental  (+) Teeth Intact and Dental Advisory Given   Pulmonary neg pulmonary ROS,  clear to auscultation  Pulmonary exam normal       Cardiovascular hypertension, Pt. on medications Regular Normal- Systolic murmurs    Neuro/Psych Negative Neurological ROS  Negative Psych ROS   GI/Hepatic negative GI ROS, Neg liver ROS,   Endo/Other  Negative Endocrine ROS  Renal/GU negative Renal ROS  Genitourinary negative   Musculoskeletal negative musculoskeletal ROS (+)   Abdominal Normal abdominal exam  (+)   Peds  Hematology negative hematology ROS (+)   Anesthesia Other Findings   Reproductive/Obstetrics negative OB ROS                          Anesthesia Physical Anesthesia Plan  ASA: II  Anesthesia Plan: General and Regional   Post-op Pain Management: MAC Combined w/ Regional for Post-op pain   Induction: Intravenous  Airway Management Planned: LMA  Additional Equipment:   Intra-op Plan:   Post-operative Plan: Extubation in OR  Informed Consent: I have reviewed the patients History and Physical, chart, labs and discussed the procedure including the risks, benefits and alternatives for the proposed anesthesia with the patient or authorized representative who has indicated his/her understanding and acceptance.   Dental advisory given  Plan Discussed with: CRNA and Anesthesiologist  Anesthesia Plan Comments:         Anesthesia Quick Evaluation

## 2011-10-12 LAB — BASIC METABOLIC PANEL
Calcium: 8.3 mg/dL — ABNORMAL LOW (ref 8.4–10.5)
Chloride: 101 mEq/L (ref 96–112)
Creatinine, Ser: 0.94 mg/dL (ref 0.50–1.35)
GFR calc Af Amer: >90 mL/min (ref >90)

## 2011-10-12 LAB — CBC
MCV: 86.6 fL (ref 78.0–100.0)
Platelets: 203 10*3/uL (ref 150–400)
RDW: 13 % (ref 11.5–15.5)
WBC: 9.1 10*3/uL (ref 4.0–10.5)

## 2011-10-12 MED ORDER — OXYCODONE HCL 20 MG PO TB12
20.0000 mg | ORAL_TABLET | Freq: Two times a day (BID) | ORAL | Status: DC
  Administered 2011-10-12 – 2011-10-13 (×3): 20 mg via ORAL
  Filled 2011-10-12 (×3): qty 1

## 2011-10-12 NOTE — Progress Notes (Signed)
Physical Therapy Treatment Note   10/12/11 1400  PT Visit Information  Last PT Received On 10/12/11  Precautions  Precautions Knee  Restrictions  RLE Weight Bearing WBAT  Bed Mobility  Sit to Supine 4: Min assist  Sit to Supine - Details (indicate cue type and reason) assist for R LE  Transfers  Sit to Stand 4: Min assist  Sit to Stand Details (indicate cue type and reason) v/c for hand placement  Stand to Sit 4: Min assist  Stand to Sit Details verbal cues for hand placement and R LE managment  Ambulation/Gait  Ambulation/Gait Assistance 4: Min assist  Ambulation/Gait Assistance Details (indicate cue type and reason) contact guard, v/cs to decreased speed and increase R LE weight-bearing to decrease UE weight-bearing  Ambulation Distance (Feet) 130 Feet  Assistive device Rolling walker  Gait Pattern Step-to pattern;Decreased stance time - right;Decreased step length - right  Gait velocity initially fast due to increased WBing through UE  Stairs No  Wheelchair Mobility  Wheelchair Mobility No  Posture/Postural Control  Posture/Postural Control No significant limitations  Balance  Balance Assessed No  Total Joint Exercises  Ankle Circles/Pumps AROM;Supine;10 reps  Quad Sets AROM;Right;10 reps;Supine  Heel Slides AROM;Right;10 reps;Seated  PT - End of Session  Equipment Utilized During Treatment Gait belt  Activity Tolerance Patient tolerated treatment well  Patient left in bed;in CPM;with call bell in reach;with family/visitor present  General  Behavior During Session [facility] for tasks performed  Cognition [facility] for tasks performed  PT - Assessment/Plan  Comments on Treatment Session Patient with significant ambulation tolerance improvement despite increased pain in R knee. Patient able to actively achieve 80 degrees of R knee active flexion in sitting.  PT Plan Discharge plan remains appropriate;Frequency remains appropriate  PT Frequency 7X/week  Follow Up Recommendations  Home health PT  Equipment Recommended None recommended by PT  Acute Rehab PT Goals  PT Goal Formulation With patient  PT Goal: Supine/Side to Sit - Progress Progressing toward goal  PT Goal: Sit to Supine/Side - Progress Progressing toward goal  PT Goal: Sit to Stand - Progress Progressing toward goal  PT Goal: Stand to Sit - Progress Progressing toward goal  PT Goal: Ambulate - Progress Progressing toward goal  PT Goal: Up/Down Stairs - Progress Progressing toward goal  PT Goal: Perform Home Exercise Program - Progress Progressing toward goal    Lewis Shock, PT, DPT Pager #: [phone] Office #: [phone]

## 2011-10-12 NOTE — Progress Notes (Signed)
[name]        MRN:  [id number]          DOB/AGE: [birth year] / 57 y.o.  Norlene Campbell, MD   Jacqualine Code, PA-C [address] [place], [place], [place]  [address]                             [phone]   PROGRESS NOTE  Subjective:  negative for Chest Pain  negative for Shortness of Breath  negative for Nausea/Vomiting   negative for Calf Pain  negative for Bowel Movement   Tolerating Diet: yes         Patient reports pain as 6 on 0-10 scale.    Objective: Vital signs in last 24 hours:   Patient Vitals for the past 24 hrs:  BP Temp Temp src Pulse Resp SpO2  10/12/11 0636 113/64 mmHg 100.4 F (38 C) - 91  19  96 %  10/12/11 0238 120/79 mmHg 98.8 F (37.1 C) - 89  20  95 %  10/11/11 2200 122/76 mmHg 97.6 F (36.4 C) Oral 90  18  95 %  10/11/11 1327 149/83 mmHg 98.8 F (37.1 C) Oral 79  16  96 %  10/11/11 1245 164/98 mmHg 97.1 F (36.2 C) - 73  9  96 %  10/11/11 1230 158/83 mmHg - - 73  11  95 %  10/11/11 1215 155/102 mmHg - - 74  11  94 %  10/11/11 1200 155/91 mmHg - - 73  8  98 %  10/11/11 1145 155/96 mmHg - - 77  7  98 %  10/11/11 1130 152/91 mmHg 98.9 F (37.2 C) - 84  12  97 %    @flow {1959:LAST@   Intake/Output from previous day:   02/18 0701 - 02/19 0700 In: 3295 [P.O.:920; I.V.:2100] Out: 2535 [Urine:2050; Drains:175]   Intake/Output this shift:   02/19 0701 - 02/19 1900 In: -  Out: 50 [Drains:50]   Intake/Output      02/18 0701 - 02/19 0700 02/19 0701 - 02/20 0700   P.O. 920    I.V. 2100    Other 275    Total Intake 3295    Urine 2050    Drains 175 50   Blood 310    Total Output 2535 50   Net +760 -50        Emesis Occurrence 1 x       LABORATORY DATA:  Basename 10/12/11 0520  WBC 9.1  HGB 10.1*  HCT 29.7*  PLT 203    Basename 10/12/11 0520  NA 135  K 3.1*  CL 101  CO2 29  BUN 12  CREATININE 0.94  GLUCOSE 134*  CALCIUM 8.3*   Lab Results  Component Value Date   INR 0.90 10/01/2011     Examination:  General appearance: alert, cooperative and no distress Extremities: Homans sign is negative, no sign of DVT  Wound Exam: clean, dry, intact   Drainage:  None: wound tissue dry  Motor Exam: EHL and FHL Intact  Sensory Exam: Deep Peroneal normal  Vascular Exam:    Assessment:    1 Day Post-Op  Procedure(s) (LRB): TOTAL KNEE ARTHROPLASTY (Right)  ADDITIONAL DIAGNOSIS:  Active Problems:  * No active hospital problems. *   Acute Blood Loss Anemia   Plan: Physical Therapy as ordered Weight Bearing as Tolerated (WBAT)  DVT Prophylaxis:  Lovenox  DISCHARGE PLAN: Home  DISCHARGE NEEDS: HHPT, CPM, Walker and 3-in-1 comode seat         Mae Denunzio 10/12/2011, 8:26 AM

## 2011-10-12 NOTE — Progress Notes (Signed)
Physical Therapy Evaluation Patient Details Name: Jeremy [name] MRN: [id number] DOB: [birth year] Today's Date: 10/12/2011  Problem List: There is no problem list on file for this patient.   Past Medical History:  Past Medical History  Diagnosis Date  . Hypertension   . Hyperlipemia   . Joint pain, knee   . Nocturia    Past Surgical History:  Past Surgical History  Procedure Date  . Nose surgery   . Knee arthroscopy 1995  . Cardiac catheterization     PT Assessment/Plan/Recommendation PT Assessment Clinical Impression Statement: 56 yo male s/p RTKA present with decreased functional mobility; will benefit from PT to maximize independence and safety with mobility, amb, steps, to enable safe dc home PT Recommendation/Assessment: Patient will need skilled PT in the acute care venue PT Problem List: Decreased strength;Decreased range of motion;Decreased activity tolerance;Decreased mobility;Decreased knowledge of use of DME;Decreased knowledge of precautions;Pain PT Therapy Diagnosis : Difficulty walking;Acute pain PT Plan PT Frequency: 7X/week PT Treatment/Interventions: DME instruction;Gait training;Stair training;Functional mobility training;Therapeutic activities;Therapeutic exercise;Patient/family education PT Recommendation Recommendations for Other Services: OT consult Follow Up Recommendations: Home health PT Equipment Recommended: None recommended by PT PT Goals  Acute Rehab PT Goals PT Goal Formulation: With patient Time For Goal Achievement: 7 days Pt will go Supine/Side to Sit: with modified independence PT Goal: Supine/Side to Sit - Progress: Goal set today Pt will go Sit to Supine/Side: with modified independence PT Goal: Sit to Supine/Side - Progress: Goal set today Pt will go Sit to Stand: with modified independence PT Goal: Sit to Stand - Progress: Goal set today Pt will go Stand to Sit: with modified independence PT Goal: Stand to Sit - Progress: Goal set  today Pt will Ambulate: >150 feet;with supervision;with rolling walker PT Goal: Ambulate - Progress: Goal set today Pt will Go Up / Down Stairs: 1-2 stairs;with min assist;with rolling walker PT Goal: Up/Down Stairs - Progress: Goal set today Pt will Perform Home Exercise Program: Independently PT Goal: Perform Home Exercise Program - Progress: Goal set today  PT Evaluation Precautions/Restrictions  Precautions Precautions: Knee Restrictions Weight Bearing Restrictions: Yes RLE Weight Bearing: Weight bearing as tolerated Prior Functioning  Home Living Lives With: Spouse Receives Help From: Family Type of Home: House Home Layout: One level Home Access: Stairs to enter Entrance Stairs-Rails: None Entrance Stairs-Number of Steps: 1 (1 step, then another step,then threshold) Bathroom Shower/Tub: Tub/shower unit;Curtain Bathroom Accessibility: Yes How Accessible: Accessible via walker Home Adaptive Equipment: Walker - rolling;Bedside commode/3-in-1 (shower chair ordered) Prior Function Level of Independence: Independent with homemaking with ambulation Able to Take Stairs?: Yes Driving: Yes Vocation: Full time employment Vocation Requirements: lots of standing Cognition Cognition Arousal/Alertness: Awake/alert Overall Cognitive Status: Appears within functional limits for tasks assessed Orientation Level: Oriented X4 Sensation/Coordination Sensation Light Touch: Appears Intact Coordination Gross Motor Movements are Fluid and Coordinated: Yes Fine Motor Movements are Fluid and Coordinated: Yes Extremity Assessment RUE Assessment RUE Assessment: Within Functional Limits LUE Assessment LUE Assessment: Within Functional Limits RLE Assessment RLE Assessment: Exceptions to [facility] RLE AROM (degrees) RLE Overall AROM Comments: Grossly decreased AROM, limited by pain postop; Able to actively flex to approx 50 deg by visual estimate RLE Strength RLE Overall Strength Comments:  Grossly decreased strength and knee control; Good quad activation LLE Assessment LLE Assessment: Within Functional Limits Mobility (including Balance) Bed Mobility Bed Mobility: Yes Supine to Sit: 3: Mod assist;With rails;HOB elevated (Comment degrees) Supine to Sit Details (indicate cue type and reason): Cues for technique, and physical  assist for RLE secondary to pain with flexion; pt also with back problems, so he semi-rolled Left to get up Sitting - Scoot to Edge of Bed: 5: Supervision Sitting - Scoot to Edge of Bed Details (indicate cue type and reason): cues for safety and to fully square off hips at EOB Transfers Transfers: Yes Sit to Stand: 4: Min assist;From bed Sit to Stand Details (indicate cue type and reason): cues for safety and safe hand placement; also cues to self monitor for activity tol (pt had been nauseated earlier Stand to Sit: 3: Mod assist;To chair/3-in-1;With armrests Stand to Sit Details: cues to pre-position RLE for comfort; cues for safety, hand placement, and control Ambulation/Gait Ambulation/Gait: Yes Ambulation/Gait Assistance: 4: Min assist Ambulation/Gait Assistance Details (indicate cue type and reason): cues to activate R quad in stance for stability; near constant cues to keep from breath-holding Ambulation Distance (Feet): 60 Feet Assistive device: Rolling walker Gait Pattern: Step-to pattern Gait velocity: slow Stairs: No Wheelchair Mobility Wheelchair Mobility: No  Posture/Postural Control Posture/Postural Control: No significant limitations Exercise  Total Joint Exercises Ankle Circles/Pumps: AROM;Both;20 reps;Supine Quad Sets: AROM;Right;15 reps;Supine Heel Slides: AAROM;Right;5 reps;Supine (limited by pain) End of Session PT - End of Session Equipment Utilized During Treatment: Gait belt Activity Tolerance: Patient limited by pain Patient left: in chair;with call bell in reach;with family/visitor present General Behavior During  Session: [facility] for tasks performed Cognition: [facility] for tasks performed  Van Clines [facility], [place] [phone]  10/12/2011, 11:08 AM

## 2011-10-12 NOTE — Progress Notes (Signed)
Occupational Therapy Evaluation Patient Details Name: Jeremy [name] MRN: [id number] DOB: 1956/08/19 Today's Date: 10/12/2011  Problem List: There is no problem list on file for this patient.   Past Medical History:  Past Medical History  Diagnosis Date  . Hypertension   . Hyperlipemia   . Joint pain, knee   . Nocturia    Past Surgical History:  Past Surgical History  Procedure Date  . Nose surgery   . Knee arthroscopy 1995  . Cardiac catheterization     OT Assessment/Plan/Recommendation OT Assessment Clinical Impression Statement: 56 yo s/p R TKA. WBAT. Educated pt on ADL retraining and available AE to increase indep with ADL as needed. Pt has all nec DME. No further OT needed at this time. Thanks for referral.+ OT Recommendation/Assessment: Patient does not need any further OT services OT Recommendation Follow Up Recommendations: No OT follow up Equipment Recommended: None recommended by PT;None recommended by OT with exception of AE OT Goals Acute Rehab OT Goals OT Goal Formulation:  (eval only) Time For Goal Achievement:  (eval only)  OT Evaluation Precautions/Restrictions  Precautions Precautions: Knee Restrictions Weight Bearing Restrictions: Yes RLE Weight Bearing: Weight bearing as tolerated Prior Functioning Home Living Lives With: Spouse Receives Help From: Family Type of Home: House Home Layout: One level Home Access: Stairs to enter Entrance Stairs-Rails: None Entrance Stairs-Number of Steps: 1 (1 step, then another step,then threshold) Bathroom Shower/Tub: Tub/shower unit;Curtain Bathroom Toilet: Handicapped height Bathroom Accessibility: Yes How Accessible: Accessible via walker Home Adaptive Equipment: Walker - rolling;Bedside commode/3-in-1 (shower chair ordered) Prior Function Level of Independence: Independent with homemaking with ambulation Able to Take Stairs?: Yes Driving: Yes Vocation: Full time employment Vocation Requirements: lots  of standing ADL ADL Eating/Feeding: Independent Where Assessed - Eating/Feeding: Chair Grooming: Performed;Wash/dry face;Wash/dry hands Where Assessed - Grooming: Standing at sink Upper Body Bathing: Simulated;Independent Where Assessed - Upper Body Bathing: Standing at sink Lower Body Bathing: Simulated;Minimal assistance (for feet) Where Assessed - Lower Body Bathing: Sit to stand from chair Upper Body Dressing: Performed;Set up Where Assessed - Upper Body Dressing: Sitting, bed Lower Body Dressing: Performed;Moderate assistance Where Assessed - Lower Body Dressing: Sit to stand from chair;Sit to stand from bed Toilet Transfer: Performed;Supervision/safety Toilet Transfer Method: Proofreader: Set designer - Clothing Manipulation: Independent Where Assessed - Glass blower/designer Manipulation: Standing Toileting - Hygiene: Independent Where Assessed - Toileting Hygiene: Standing Tub/Shower Transfer: Not assessed (pt has tub bench. Educated on transfer technique) Equipment Used: Long-handled shoe horn;Reacher;Long-handled sponge;Rolling walker;Sock aid Ambulation Related to ADLs: supervision ADL Comments: Completed all education regarding ADL retraining s/p TKA. Pt aware of available AE. Pt states wife will assist has needed with self care. Has all nec DME. Vision/Perception  Perception Perception: Within Functional Limits Cognition Cognition Arousal/Alertness: Awake/alert Overall Cognitive Status: Appears within functional limits for tasks assessed Orientation Level: Oriented X4 Sensation/Coordination Sensation Light Touch: Appears Intact Coordination Gross Motor Movements are Fluid and Coordinated: Yes Fine Motor Movements are Fluid and Coordinated: Yes Extremity Assessment RUE Assessment RUE Assessment: Within Functional Limits LUE Assessment LUE Assessment: Within Functional Limits Mobility  Bed Mobility Bed Mobility:  No Transfers Transfers: Yes Sit to Stand: 5: Supervision Sit to Stand Details (indicate cue type and reason): min cues for hand placement Stand to Sit: 5: Supervision End of Session OT - End of Session Equipment Utilized During Treatment: Gait belt Activity Tolerance: Patient tolerated treatment well Patient left: in chair;with call bell in reach;with family/visitor present General Behavior During Session: [facility]  for tasks performed Cognition: [facility] for tasks performed   [facility] 10/12/2011, 6:38 PM  [facility], OTR/L  [phone] 10/12/2011

## 2011-10-12 NOTE — Progress Notes (Signed)
Orthopedic Tech Progress Note Patient Details:  Jeremy [name] [birth year] [id number]  CPM Right Knee CPM Right Knee: Other (Comment) (put pt in cpm at 1425) Right Knee Flexion (Degrees): 90  Right Knee Extension (Degrees): 0    Cammer, Mickie Bail 10/12/2011, 2:23 PM Put pt in cpm at 1425

## 2011-10-13 LAB — BASIC METABOLIC PANEL
Chloride: 99 mEq/L (ref 96–112)
GFR calc Af Amer: >90 mL/min (ref >90)
Potassium: 3.3 mEq/L — ABNORMAL LOW (ref 3.5–5.1)

## 2011-10-13 LAB — CBC
HCT: 27.9 % — ABNORMAL LOW (ref 39.0–52.0)
Platelets: 181 10*3/uL (ref 150–400)
RDW: 12.9 % (ref 11.5–15.5)
WBC: 9.2 10*3/uL (ref 4.0–10.5)

## 2011-10-13 MED ORDER — METHOCARBAMOL 500 MG PO TABS: 500.0000 mg | ORAL_TABLET | Freq: Four times a day (QID) | ORAL | Status: AC | PRN

## 2011-10-13 MED ORDER — OXYCODONE HCL 20 MG PO TB12: 20.0000 mg | ORAL_TABLET | Freq: Two times a day (BID) | ORAL | Status: AC

## 2011-10-13 MED ORDER — ENOXAPARIN SODIUM 40 MG/0.4ML ~~LOC~~ SOLN: 40.0000 mg | SUBCUTANEOUS | Status: DC

## 2011-10-13 MED ORDER — CELECOXIB 200 MG PO CAPS: 200.0000 mg | ORAL_CAPSULE | Freq: Two times a day (BID) | ORAL | Status: AC

## 2011-10-13 MED ORDER — OXYCODONE HCL 5 MG PO TABS: 5.0000 mg | ORAL_TABLET | ORAL | Status: AC | PRN

## 2011-10-13 NOTE — Progress Notes (Signed)
  Georgena Spurling, MD   Altamese Cabal, PA-C [address] [place], [place], [place]  [address]                             [phone]   PROGRESS NOTE  Subjective:  negative for Chest Pain  negative for Shortness of Breath  negative for Nausea/Vomiting   negative for Calf Pain  negative for Bowel Movement   Tolerating Diet: yes         Patient reports pain as 6 on 0-10 scale.    Objective: Vital signs in last 24 hours:   Patient Vitals for the past 24 hrs:  BP Temp Pulse Resp SpO2 Height Weight  10/13/11 0619 132/76 mmHg 97.7 F (36.5 C) 93  20  93 % - -  10/12/11 2225 120/69 mmHg 98.7 F (37.1 C) 104  20  92 % - -  10/12/11 2056 - - - - - 5\' 6"  (1.676 m) 100.2 kg (220 lb 14.4 oz)  10/12/11 1325 107/72 mmHg 98.7 F (37.1 C) 91  20  98 % - -    @flow {1959:LAST@   Intake/Output from previous day:   02/19 0701 - 02/20 0700 In: 920 [P.O.:720; I.V.:200] Out: 1900 [Urine:1850; Drains:50]   Intake/Output this shift:   02/19 1901 - 02/20 0700 In: -  Out: 1250 [Urine:1250]   Intake/Output      02/19 0701 - 02/20 0700   P.O. 720   I.V. (mL/kg) 200 (2)   Total Intake(mL/kg) 920 (9.2)   Urine (mL/kg/hr) 1850 (0.8)   Drains 50   Total Output 1900   Net -980          LABORATORY DATA:  Basename 10/13/11 0525 10/12/11 0520  WBC 9.2 9.1  HGB 9.7* 10.1*  HCT 27.9* 29.7*  PLT 181 203    Basename 10/12/11 0520  NA 135  K 3.1*  CL 101  CO2 29  BUN 12  CREATININE 0.94  GLUCOSE 134*  CALCIUM 8.3*   Lab Results  Component Value Date   INR 0.90 10/01/2011    Examination:  General appearance: alert, cooperative and no distress Extremities: extremities normal, atraumatic, no cyanosis or edema and Homans sign is negative, no sign of DVT  Wound Exam: clean, dry, intact   Drainage:  None: wound tissue dry  Motor Exam: EHL and FHL Intact  Sensory Exam: Deep Peroneal normal  Vascular Exam:    Assessment:    2 Days Post-Op  Procedure(s) (LRB): TOTAL KNEE  ARTHROPLASTY (Right)  ADDITIONAL DIAGNOSIS:  Active Problems:  * No active hospital problems. *   Acute Blood Loss Anemia   Plan: Physical Therapy as ordered Weight Bearing as Tolerated (WBAT)  DVT Prophylaxis:  Lovenox  DISCHARGE PLAN: Home  DISCHARGE NEEDS: HHPT, CPM, Walker and 3-in-1 comode seat         Jamarius Saha 10/13/2011, 6:45 AM

## 2011-10-13 NOTE — Progress Notes (Signed)
Physical Therapy Treatment Note   10/13/11 1000  PT Visit Information  Last PT Received On 10/13/11  Precautions  Precautions Knee  Restrictions  RLE Weight Bearing WBAT  Bed Mobility  Supine to Sit 5: Supervision  Supine to Sit Details (indicate cue type and reason) cues for technique due to [facility] flat and not using bed rails to mimic home set up  Transfers  Sit to Stand 5: Supervision  Sit to Stand Details (indicate cue type and reason) verbal cues for hand placement and R LE management  Ambulation/Gait  Ambulation/Gait Assistance 4: Min assist (contact guard)  Ambulation/Gait Assistance Details (indicate cue type and reason) 1 episode of R knee buckling however pt recovered balance I'ly  Ambulation Distance (Feet) 130 Feet  Assistive device Rolling walker  Gait Pattern Step-to pattern;Decreased step length - right;Decreased stance time - right  Gait velocity decreased cadence  Stairs Yes  Stairs Assistance 4: Min assist (contact guard)  Stairs Assistance Details (indicate cue type and reason) practiced fwd/bkward on both 4 and 8 in step to mimic home entry  Stair Management Technique No rails;Forwards;Backwards;With walker  Number of Stairs 1   Height of Stairs (4 and 8 in)  Wheelchair Mobility  Wheelchair Mobility No  Posture/Postural Control  Posture/Postural Control No significant limitations  Total Joint Exercises  Ankle Circles/Pumps AROM;Both;10 reps  Quad Sets AROM;Right;10 reps  Heel Slides AAROM;Right;10 reps;Supine  Short Arc Quad (attempted however pt unable to tolerate)  PT - End of Session  Equipment Utilized During Treatment Gait belt  General  Behavior During Session [facility] for tasks performed  Cognition [facility] for tasks performed  PT - Assessment/Plan  Comments on Treatment Session Patient con't to have increased pain with R knee flexion and remains to have significant decrease in R LE strength however patient safe to return home with spouse and home health PT.  Patient and spouse returned demonstrated safe stair negotiation technique.  PT Plan Discharge plan remains appropriate;Frequency remains appropriate  Follow Up Recommendations Home health PT  Acute Rehab PT Goals  PT Goal: Supine/Side to Sit - Progress Progressing toward goal  PT Goal: Sit to Supine/Side - Progress Progressing toward goal  PT Goal: Sit to Stand - Progress Progressing toward goal  PT Goal: Stand to Sit - Progress Progressing toward goal  PT Goal: Ambulate - Progress Progressing toward goal  PT Goal: Up/Down Stairs - Progress Progressing toward goal  PT Goal: Perform Home Exercise Program - Progress Progressing toward goal    Pain: 5/10 R knee, increased to 10/10 R knee flexion  Lewis Shock, PT, DPT Pager #: [phone] Office #: [phone]

## 2011-10-13 NOTE — Progress Notes (Signed)
Physical Therapy Treatment Note   10/13/11 1400  PT Visit Information  Last PT Received On 10/13/11  Precautions  Precautions Knee  Restrictions  RLE Weight Bearing WBAT  Bed Mobility  Bed Mobility (pt received sitting up in chair)  Transfers  Sit to Stand 5: Supervision  Ambulation/Gait  Ambulation/Gait Assistance 4: Min assist (contact guard)  Ambulation/Gait Assistance Details (indicate cue type and reason) encouragement to increase R LE WBing  Ambulation Distance (Feet) 150 Feet  Assistive device Rolling walker  Gait Pattern Step-to pattern;Decreased step length - right;Decreased stance time - right  Gait velocity decreased cadende, 2 standing rest breaks  Wheelchair Mobility  Wheelchair Mobility No  Total Joint Exercises  Quad Sets AROM;Right;10 reps  Heel Slides AROM;Right;10 reps (pt achieved 70 degrees active knee flexion)  Short Arc Quad AROM;Right;10 reps  PT - End of Session  Equipment Utilized During Treatment Gait belt  Activity Tolerance Patient tolerated treatment well  Patient left in chair;with call bell in reach;with family/visitor present  General  Behavior During Session [facility] for tasks performed  Cognition [facility] for tasks performed  PT - Assessment/Plan  Comments on Treatment Session Patient safe for d/c home this date. Discussed with patient and spouse how to transfer in/out of truck.   PT Plan Discharge plan remains appropriate;Frequency remains appropriate  PT Frequency 7X/week  Acute Rehab PT Goals  PT Goal: Supine/Side to Sit - Progress Progressing toward goal  PT Goal: Sit to Supine/Side - Progress Progressing toward goal  PT Goal: Sit to Stand - Progress Progressing toward goal  PT Goal: Stand to Sit - Progress Progressing toward goal  PT Goal: Ambulate - Progress Progressing toward goal  PT Goal: Perform Home Exercise Program - Progress Met    Pain: 5/10 R knee  Ice provided to assist in edema management.   Lewis Shock, PT, DPT Pager #:  [phone] Office #: [phone]

## 2011-10-13 NOTE — Progress Notes (Signed)
Utilization review completed. Bonnell Placzek, RN, BSN. 10/13/11 

## 2011-10-13 NOTE — Op Note (Signed)
TOTAL KNEE REPLACEMENT OPERATIVE NOTE:  10/11/2011  6:17 AM  PATIENT:  Jeremy [name]  56 y.o. male  PRE-OPERATIVE DIAGNOSIS:  osteoarthritis right knee  POST-OPERATIVE DIAGNOSIS:  osteoarthritis right knee  PROCEDURE:  Procedure(s): TOTAL KNEE ARTHROPLASTY  SURGEON:  Surgeon(s): Raymon Mutton, MD  PHYSICIAN ASSISTANT: Altamese Cabal, [facility]  ANESTHESIA:   general  DRAINS: Hemovac and On-Q Marcaine Pain Pump  SPECIMEN: None  COUNTS:  Correct  TOURNIQUET:   Total Tourniquet Time Documented: Thigh (Right) - 75 minutes  DICTATION:  Indication for procedure:    The patient is a 56 y.o. male who has failed conservative treatment for osteoarthritis right knee.  Informed consent was obtained prior to anesthesia. The risks versus benefits of the operation were explain and in a way the patient can, and did, understand.   Description of procedure:     The patient was taken to the operating room and placed under anesthesia.  The patient was positioned in the usual fashion taking care that all body parts were adequately padded and/or protected.  I foley catheter was not placed.  A tourniquet was applied and the leg prepped and draped in the usual sterile fashion.  The extremity was exsanguinated with the esmarch and tourniquet inflated to 350 mmHg.  Pre-operative range of motion was normal.  The knee was in 5 degree of mild varus.  A midline incision approximately 6-7 inches long was made with a #10 blade.  A new blade was used to make a parapatellar arthrotomy going 2-3 cm into the quadriceps tendon, over the patella, and alongside the medial aspect of the patellar tendon.  A synovectomy was then performed with the #10 blade and forceps. I then elevated the deep MCL off the medial tibial metaphysis subperiosteally around to the semimembranosus attachment.    I everted the patella and used calipers to measure patellar thickness.  I used the reamer to ream down to appropriate thickness  to recreate the native thickness.  I then removed excess bone with the rongeur and sagittal saw.  I used the appropriately sized template and drilled the three lug holes.  I then put the trial in place and measured the thickness with the calipers to ensure recreation of the native thickness.  The trial was then removed and the patella subluxed and the knee brought into flexion.  A homan retractor was place to retract and protect the patella and lateral structures.  A Z-retractor was place medially to protect the medial structures.  The extra-medullary alignment system was used to make cut the tibial articular surface perpendicular to the anamotic axis of the tibia and in 3 degrees of posterior slope.  The cut surface and alignment jig was removed.  I then used the intramedullary alignment guide to make a 6 valgus cut on the distal femur.  I then marked out the epicondylar axis on the distal femur.  The posterior condylar axis measured 3 degrees.  I then used the anterior referencing sizer and measured the femur to be a size F.  The 4-In-1 cutting block was screwed into place in external rotation matching the posterior condylar angle, making our cuts perpendicular to the epicondylar axis.  Anterior, posterior and chamfer cuts were made with the sagittal saw.  The cutting block and cut pieces were removed.  A lamina spreader was placed in 90 degrees of flexion.  The ACL, PCL, menisci, and posterior condylar osteophytes were removed.  A 10 mm spacer blocked was found to offer good  flexion and extension gap balance after moderate in degree releasing.   The scoop retractor was then placed and the femoral finishing block was pinned in place.  The small sagittal saw was used as well as the lug drill to finish the femur.  The block and cut surfaces were removed and the medullary canal hole filled with autograft bone from the cut pieces.  The tibia was delivered forward in deep flexion and external rotation.  A size  6 tray was selected and pinned into place centered on the medial 1/3 of the tibial tubercle.  The reamer and keel was used to prepare the tibia through the tray.    I then trialed with the size F femur, size 6 tibia, a 10 mm insert and the 38 patella.  I had excellent flexion/extension gap balance, excellent patella tracking.  Flexion was full and beyond 120 degrees; extension was zero.  These components were chosen and the staff opened them to me on the back table while the knee was lavaged copiously and the cement mixed.  I cemented in the components and removed all excess cement.  The polyethylene tibial component was snapped into place and the knee placed in extension while cement was hardening.  The capsule was infilltrated with 20cc of .25% Marcaine with epinephrine.  A hemovac was place in the joint exiting superolaterally.  A pain pump was place superomedially superficial to the arthrotomy.  Once the cement was hard, the tourniquet was let down.  Hemostasis was obtained.  The arthrotomy was closed with figure-8 #1 vicryl sutures.  The deep soft tissues were closed with #0 vicryls and the subcuticular layer closed with a running #2-0 vicryl.  The skin was reapproximated and closed with skin staples.  The wound was dressed with xeroform, 4 x4's, 2 ABD sponges, a single layer of webril and a TED stocking.   The patient was then awakened, extubated, and taken to the recovery room in stable condition.  BLOOD LOSS:  300cc DRAINS: 1 hemovac, 1 pain catheter COMPLICATIONS:  None.  PLAN OF CARE: Admit to inpatient   PATIENT DISPOSITION:  PACU - hemodynamically stable.   Delay start of Pharmacological VTE agent (>24hrs) due to surgical blood loss or risk of bleeding:  not applicable  Please fax a copy of this op note to my office at [phone] (please only include page 1 and 2 of the Case Information op note)

## 2011-10-13 NOTE — Discharge Summary (Signed)
PATIENT ID:      Jeremy [name]  MRN:     [id number] DOB/AGE:    01/17/1956 / 56 y.o.     DISCHARGE SUMMARY  ADMISSION DATE:    10/11/2011 DISCHARGE DATE:   10/13/2011   ADMISSION DIAGNOSIS: osteoarthritis right knee  (osteoarthritis right knee)  DISCHARGE DIAGNOSIS:  osteoarthritis right knee    ADDITIONAL DIAGNOSIS: Active Problems:  * No active hospital problems. *   Past Medical History  Diagnosis Date  . Hypertension   . Hyperlipemia   . Joint pain, knee   . Nocturia     PROCEDURE: Procedure(s): TOTAL KNEE ARTHROPLASTY on 10/11/2011  CONSULTS:     HISTORY:  See H&P in chart  HOSPITAL COURSE:  Jeremy [name] is a 56 y.o. admitted on 10/11/2011 and found to have a diagnosis of osteoarthritis right knee.  After appropriate laboratory studies were obtained  they were taken to the operating room on 10/11/2011 and underwent Procedure(s): TOTAL KNEE ARTHROPLASTY.   They were given perioperative antibiotics:  Anti-infectives     Start     Dose/Rate Route Frequency Ordered Stop   10/11/11 1500   ceFAZolin (ANCEF) IVPB 2 g/50 mL premix        2 g 100 mL/hr over 30 Minutes Intravenous Every 6 hours 10/11/11 1326 October 20, 2011 0245   10/10/11 1045   ceFAZolin (ANCEF) IVPB 2 g/50 mL premix        2 g 100 mL/hr over 30 Minutes Intravenous 60 min pre-op 10/10/11 1043 10/11/11 0851        . Blood products given:none   The remainder of the hospital course was dedicated to ambulation and strengthening.   The patient was discharged on 2 Days Post-Op in  Good condition.   DIAGNOSTIC STUDIES: Recent vital signs: Patient Vitals for the past 24 hrs:  BP Temp Pulse Resp SpO2 Height Weight  10/13/11 0619 132/76 mmHg 97.7 F (36.5 C) 93  20  93 % - -  October 20, 2011 2225 120/69 mmHg 98.7 F (37.1 C) 104  20  92 % - -  10-20-2011 2056 - - - - - 5\' 6"  (1.676 m) 100.2 kg (220 lb 14.4 oz)  10-20-2011 1325 107/72 mmHg 98.7 F (37.1 C) 91  20  98 % - -       Recent laboratory  studies:  [facility] 10/13/11 0525 Oct 20, 2011 0520  WBC 9.2 9.1  HGB 9.7* 10.1*  HCT 27.9* 29.7*  PLT 181 203    Basename 10/13/11 0525 October 20, 2011 0520  NA 134* 135  K 3.3* 3.1*  CL 99 101  CO2 30 29  BUN 9 12  CREATININE 0.91 0.94  GLUCOSE 141* 134*  CALCIUM 8.3* 8.3*   Lab Results  Component Value Date   INR 0.90 10/01/2011     Recent Radiographic Studies :  Dg Chest 2 View  10/11/2011  *RADIOLOGY REPORT*  Clinical Data: Preop.  CHEST - 2 VIEW  Comparison: None.  Findings: Trachea is midline.  Heart size normal.  Lungs are low in volume but clear.  No pleural fluid.  IMPRESSION: No acute findings.  Original Report Authenticated By: Reyes Ivan, M.D.    DISCHARGE INSTRUCTIONS: Discharge Orders    Future Orders Please Complete By Expires   Diet - low sodium heart healthy      Call MD / Call 911      Comments:   If you experience chest pain or shortness of breath, CALL 911 and be transported to  the hospital emergency room.  If you develope a fever above 101 F, pus (white drainage) or increased drainage or redness at the wound, or calf pain, call your surgeon's office.   Constipation Prevention      Comments:   Drink plenty of fluids.  Prune juice may be helpful.  You may use a stool softener, such as Colace (over the counter) 100 mg twice a day.  Use MiraLax (over the counter) for constipation as needed.   Increase activity slowly as tolerated      Weight Bearing as taught in Physical Therapy      Comments:   Use a walker or crutches as instructed.   Driving restrictions      Comments:   No driving for 6 weeks   Lifting restrictions      Comments:   No lifting for 6 weeks   CPM      Comments:   Continuous passive motion machine (CPM):      Use the CPM from 0 to 90 for 6-8 hours per day.      You may increase by 10 per day.  You may break it up into 2 or 3 sessions per day.      Use CPM for 2 weeks or until you are told to stop.   TED hose      Comments:   Use  stockings (TED hose) for 3 weeks on both leg(s).  You may remove them at night for sleeping.   Change dressing      Comments:   Change dressing on thursday, then change the dressing daily with sterile 4 x 4 inch gauze dressing and apply TED hose.  You may clean the incision with alcohol prior to redressing.   Do not put a pillow under the knee. Place it under the heel.         DISCHARGE MEDICATIONS:   Medication List  As of 10/13/2011  6:53 AM   STOP taking these medications         FISH OIL PO         TAKE these medications         amLODipine-benazepril 5-20 MG per capsule   Commonly known as: LOTREL   Take 1 capsule by mouth daily.      b complex vitamins tablet   Take 1 tablet by mouth daily.      celecoxib 200 MG capsule   Commonly known as: CELEBREX   Take 1 capsule (200 mg total) by mouth 2 (two) times daily.      enoxaparin 40 MG/0.4ML Soln   Commonly known as: LOVENOX   Inject 0.4 mLs (40 mg total) into the skin daily.      fexofenadine 180 MG tablet   Commonly known as: ALLEGRA   Take 180 mg by mouth daily.      GLUCOSAMINE-CHONDROITIN PO   Take 2 tablets by mouth daily.      hydrochlorothiazide 12.5 MG capsule   Commonly known as: MICROZIDE   Take 12.5 mg by mouth daily.      methocarbamol 500 MG tablet   Commonly known as: ROBAXIN   Take 1-2 tablets (500-1,000 mg total) by mouth every 6 (six) hours as needed.      multivitamins ther. w/minerals Tabs   Take 1 tablet by mouth daily.      oxyCODONE 5 MG immediate release tablet   Commonly known as: Oxy IR/ROXICODONE   Take 1-2 tablets (5-10 mg total) by  mouth every 3 (three) hours as needed.      oxyCODONE 20 MG 12 hr tablet   Commonly known as: OXYCONTIN   Take 1 tablet (20 mg total) by mouth every 12 (twelve) hours.            FOLLOW UP VISIT:   Follow-up Information    Follow up with Raymon Mutton, MD. Call on 10/26/2011.   Contact information:   [address] [place]  [place] [address] [phone]          DISPOSITION:  Home  Final discharge disposition not confirmed  CONDITION:  Good   Jeremy [name] 10/13/2011, 6:53 AM

## 2011-10-13 NOTE — Discharge Instructions (Signed)
Diet: As you were doing prior to hospitalization   Activity:  Increase activity slowly as tolerated                  No lifting or driving for 6 weeks  Shower:  May shower without a dressing once there is no drainage from your wound.                 Do NOT wash over the wound.  If drainage remains, cover wound with saran                  Wrap and then shower.  Clean incision with betadine and change dressing                        After saran wrap removed.  Dressing:  You may change your dressing on Thursday                    Then change the dressing daily with sterile 4"x4"s gauze dressing                     And TED hose for knees.  Use paper tape to hold dressing in place                     For hips.  You may clean the incision with alcohol prior to redressing.  Weight Bearing:  Weight bearing as tolerated as taught in physical therapy.  Use a                                walker or Crutches as instructed.  To prevent constipation: you may use a stool softener such as -               Colace ( over the counter) 100 mg by mouth twice a day                Drink plenty of fluids ( prune juice may be helpful) and high fiber foods                Miralax ( over the counter) for constipation as needed.    Precautions:  If you experience chest pain or shortness of breath - call 911 immediately               For transfer to the hospital emergency department!!               If you develop a fever greater that 101 F, purulent drainage from wound,                             increased redness or drainage from wound, or calf pain -- Call the office at                                                 [phone].  Follow- Up Appointment:  Please call for an appointment to be seen on 10/26/11  [place] - [phone]                   

## 2011-10-14 ENCOUNTER — Other Ambulatory Visit (HOSPITAL_COMMUNITY): Payer: Self-pay

## 2011-10-19 ENCOUNTER — Encounter (HOSPITAL_COMMUNITY): Payer: Self-pay | Admitting: Orthopedic Surgery

## 2011-10-27 ENCOUNTER — Ambulatory Visit: Payer: BC Managed Care – PPO | Attending: Orthopedic Surgery | Admitting: Physical Therapy

## 2011-10-27 DIAGNOSIS — R262 Difficulty in walking, not elsewhere classified: Secondary | ICD-10-CM | POA: Insufficient documentation

## 2011-10-27 DIAGNOSIS — IMO0001 Reserved for inherently not codable concepts without codable children: Secondary | ICD-10-CM | POA: Insufficient documentation

## 2011-10-27 DIAGNOSIS — M25569 Pain in unspecified knee: Secondary | ICD-10-CM | POA: Insufficient documentation

## 2011-10-27 DIAGNOSIS — M25669 Stiffness of unspecified knee, not elsewhere classified: Secondary | ICD-10-CM | POA: Insufficient documentation

## 2011-10-29 ENCOUNTER — Ambulatory Visit: Payer: BC Managed Care – PPO | Admitting: Physical Therapy

## 2011-11-01 ENCOUNTER — Ambulatory Visit: Payer: BC Managed Care – PPO | Admitting: Physical Therapy

## 2011-11-03 ENCOUNTER — Ambulatory Visit: Payer: BC Managed Care – PPO | Admitting: Physical Therapy

## 2011-11-05 ENCOUNTER — Ambulatory Visit: Payer: BC Managed Care – PPO | Admitting: Physical Therapy

## 2011-11-08 ENCOUNTER — Ambulatory Visit: Payer: BC Managed Care – PPO | Admitting: Physical Therapy

## 2011-11-10 ENCOUNTER — Ambulatory Visit: Payer: BC Managed Care – PPO | Admitting: Physical Therapy

## 2011-11-12 ENCOUNTER — Ambulatory Visit: Payer: BC Managed Care – PPO | Admitting: Physical Therapy

## 2011-11-15 ENCOUNTER — Ambulatory Visit: Payer: BC Managed Care – PPO | Admitting: Physical Therapy

## 2011-11-18 ENCOUNTER — Ambulatory Visit: Payer: BC Managed Care – PPO | Admitting: Physical Therapy

## 2011-11-22 ENCOUNTER — Ambulatory Visit: Payer: BC Managed Care – PPO | Attending: Orthopedic Surgery | Admitting: Physical Therapy

## 2011-11-22 DIAGNOSIS — R262 Difficulty in walking, not elsewhere classified: Secondary | ICD-10-CM | POA: Insufficient documentation

## 2011-11-22 DIAGNOSIS — IMO0001 Reserved for inherently not codable concepts without codable children: Secondary | ICD-10-CM | POA: Insufficient documentation

## 2011-11-22 DIAGNOSIS — M25569 Pain in unspecified knee: Secondary | ICD-10-CM | POA: Insufficient documentation

## 2011-11-22 DIAGNOSIS — M25669 Stiffness of unspecified knee, not elsewhere classified: Secondary | ICD-10-CM | POA: Insufficient documentation

## 2011-11-24 ENCOUNTER — Ambulatory Visit: Payer: BC Managed Care – PPO | Admitting: Physical Therapy

## 2011-11-26 ENCOUNTER — Ambulatory Visit: Payer: BC Managed Care – PPO | Admitting: Physical Therapy

## 2011-11-29 ENCOUNTER — Ambulatory Visit: Payer: BC Managed Care – PPO | Admitting: Physical Therapy

## 2011-12-01 ENCOUNTER — Ambulatory Visit: Payer: BC Managed Care – PPO | Admitting: Physical Therapy

## 2011-12-03 ENCOUNTER — Ambulatory Visit: Payer: BC Managed Care – PPO | Admitting: Physical Therapy

## 2011-12-06 ENCOUNTER — Ambulatory Visit: Payer: BC Managed Care – PPO | Admitting: Physical Therapy

## 2011-12-08 ENCOUNTER — Ambulatory Visit: Payer: BC Managed Care – PPO | Admitting: Physical Therapy

## 2011-12-10 ENCOUNTER — Ambulatory Visit: Payer: BC Managed Care – PPO | Admitting: Physical Therapy

## 2011-12-13 ENCOUNTER — Ambulatory Visit: Payer: BC Managed Care – PPO | Admitting: Physical Therapy

## 2011-12-15 ENCOUNTER — Ambulatory Visit: Payer: BC Managed Care – PPO | Admitting: Physical Therapy

## 2011-12-17 ENCOUNTER — Ambulatory Visit: Payer: BC Managed Care – PPO | Admitting: Physical Therapy

## 2011-12-20 ENCOUNTER — Ambulatory Visit: Payer: BC Managed Care – PPO | Admitting: Physical Therapy

## 2011-12-23 ENCOUNTER — Ambulatory Visit: Payer: BC Managed Care – PPO | Attending: Orthopedic Surgery | Admitting: Physical Therapy

## 2011-12-23 DIAGNOSIS — IMO0001 Reserved for inherently not codable concepts without codable children: Secondary | ICD-10-CM | POA: Insufficient documentation

## 2011-12-23 DIAGNOSIS — R262 Difficulty in walking, not elsewhere classified: Secondary | ICD-10-CM | POA: Insufficient documentation

## 2011-12-23 DIAGNOSIS — M25669 Stiffness of unspecified knee, not elsewhere classified: Secondary | ICD-10-CM | POA: Insufficient documentation

## 2011-12-23 DIAGNOSIS — M25569 Pain in unspecified knee: Secondary | ICD-10-CM | POA: Insufficient documentation

## 2011-12-29 ENCOUNTER — Encounter: Payer: BC Managed Care – PPO | Admitting: Physical Therapy

## 2012-01-25 ENCOUNTER — Ambulatory Visit: Payer: BC Managed Care – PPO | Attending: Orthopedic Surgery | Admitting: Physical Therapy

## 2012-01-25 DIAGNOSIS — R262 Difficulty in walking, not elsewhere classified: Secondary | ICD-10-CM | POA: Insufficient documentation

## 2012-01-25 DIAGNOSIS — M25569 Pain in unspecified knee: Secondary | ICD-10-CM | POA: Insufficient documentation

## 2012-01-25 DIAGNOSIS — M25669 Stiffness of unspecified knee, not elsewhere classified: Secondary | ICD-10-CM | POA: Insufficient documentation

## 2012-01-25 DIAGNOSIS — IMO0001 Reserved for inherently not codable concepts without codable children: Secondary | ICD-10-CM | POA: Insufficient documentation

## 2012-01-26 ENCOUNTER — Ambulatory Visit: Payer: BC Managed Care – PPO

## 2012-02-01 ENCOUNTER — Ambulatory Visit: Payer: BC Managed Care – PPO

## 2012-02-04 ENCOUNTER — Ambulatory Visit: Payer: BC Managed Care – PPO | Admitting: Rehabilitation

## 2012-02-08 ENCOUNTER — Ambulatory Visit: Payer: BC Managed Care – PPO

## 2012-02-10 ENCOUNTER — Ambulatory Visit: Payer: BC Managed Care – PPO

## 2012-02-15 ENCOUNTER — Ambulatory Visit: Payer: BC Managed Care – PPO

## 2012-03-01 ENCOUNTER — Ambulatory Visit: Payer: BC Managed Care – PPO | Attending: Orthopedic Surgery | Admitting: Rehabilitation

## 2012-03-01 DIAGNOSIS — IMO0001 Reserved for inherently not codable concepts without codable children: Secondary | ICD-10-CM | POA: Insufficient documentation

## 2012-03-01 DIAGNOSIS — M25569 Pain in unspecified knee: Secondary | ICD-10-CM | POA: Insufficient documentation

## 2012-03-01 DIAGNOSIS — M25669 Stiffness of unspecified knee, not elsewhere classified: Secondary | ICD-10-CM | POA: Insufficient documentation

## 2012-03-01 DIAGNOSIS — R262 Difficulty in walking, not elsewhere classified: Secondary | ICD-10-CM | POA: Insufficient documentation

## 2012-03-08 ENCOUNTER — Ambulatory Visit: Payer: BC Managed Care – PPO | Admitting: Rehabilitation

## 2012-03-15 ENCOUNTER — Ambulatory Visit: Payer: BC Managed Care – PPO

## 2012-03-20 ENCOUNTER — Ambulatory Visit: Payer: BC Managed Care – PPO | Admitting: Physical Therapy

## 2012-03-27 ENCOUNTER — Ambulatory Visit: Payer: BC Managed Care – PPO | Attending: Orthopedic Surgery | Admitting: Physical Therapy

## 2012-03-27 DIAGNOSIS — M25669 Stiffness of unspecified knee, not elsewhere classified: Secondary | ICD-10-CM | POA: Insufficient documentation

## 2012-03-27 DIAGNOSIS — R262 Difficulty in walking, not elsewhere classified: Secondary | ICD-10-CM | POA: Insufficient documentation

## 2012-03-27 DIAGNOSIS — IMO0001 Reserved for inherently not codable concepts without codable children: Secondary | ICD-10-CM | POA: Insufficient documentation

## 2012-03-27 DIAGNOSIS — M25569 Pain in unspecified knee: Secondary | ICD-10-CM | POA: Insufficient documentation

## 2012-03-29 ENCOUNTER — Ambulatory Visit: Payer: BC Managed Care – PPO | Admitting: Physical Therapy

## 2012-04-03 ENCOUNTER — Ambulatory Visit: Payer: BC Managed Care – PPO | Admitting: Physical Therapy

## 2013-01-18 IMAGING — CR DG CHEST 2V
2 series · 2 of 2 positions shown · non-contrast
Comparison: None.

CLINICAL DATA: Preop.

CHEST - 2 VIEW

[w chest pa]
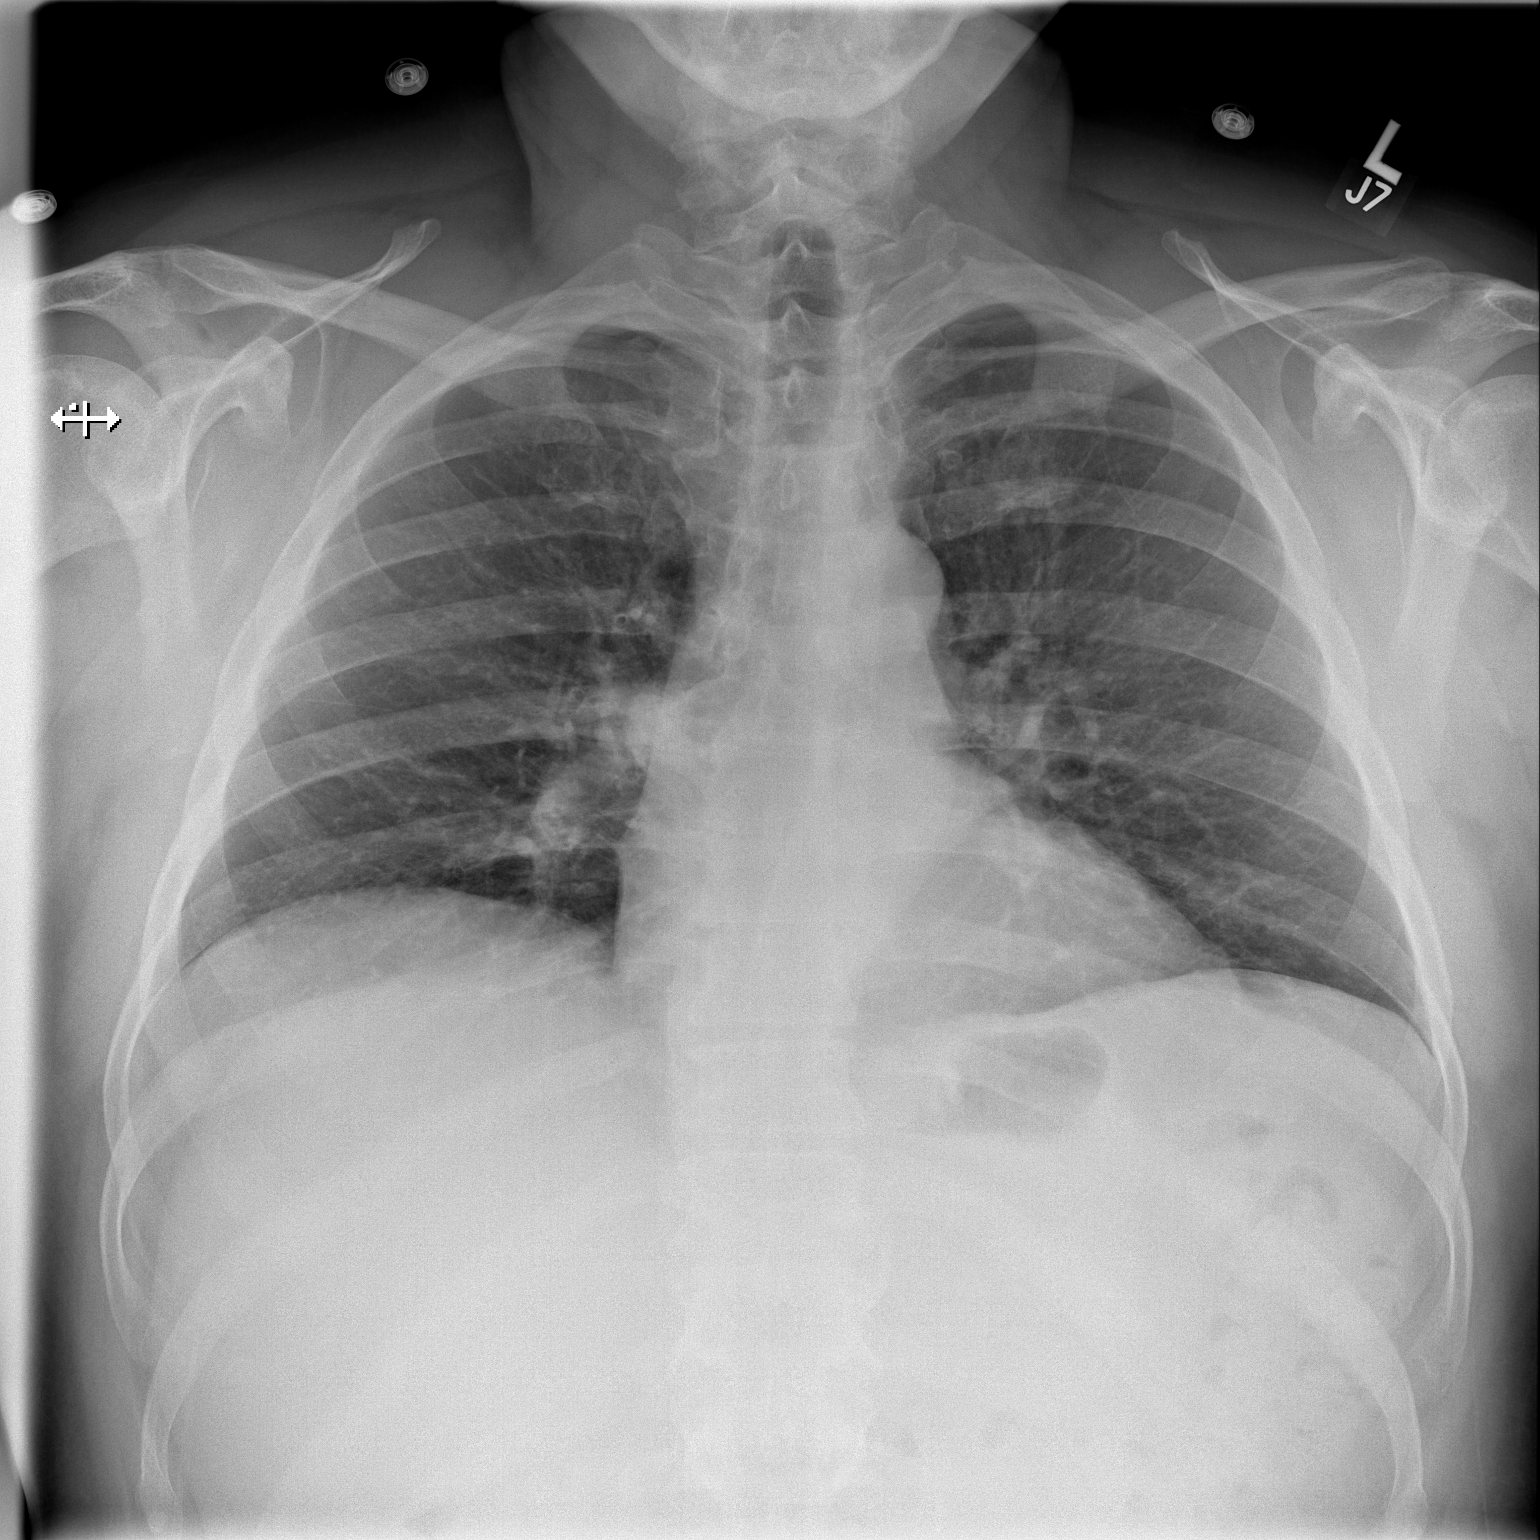

[w chest lat]
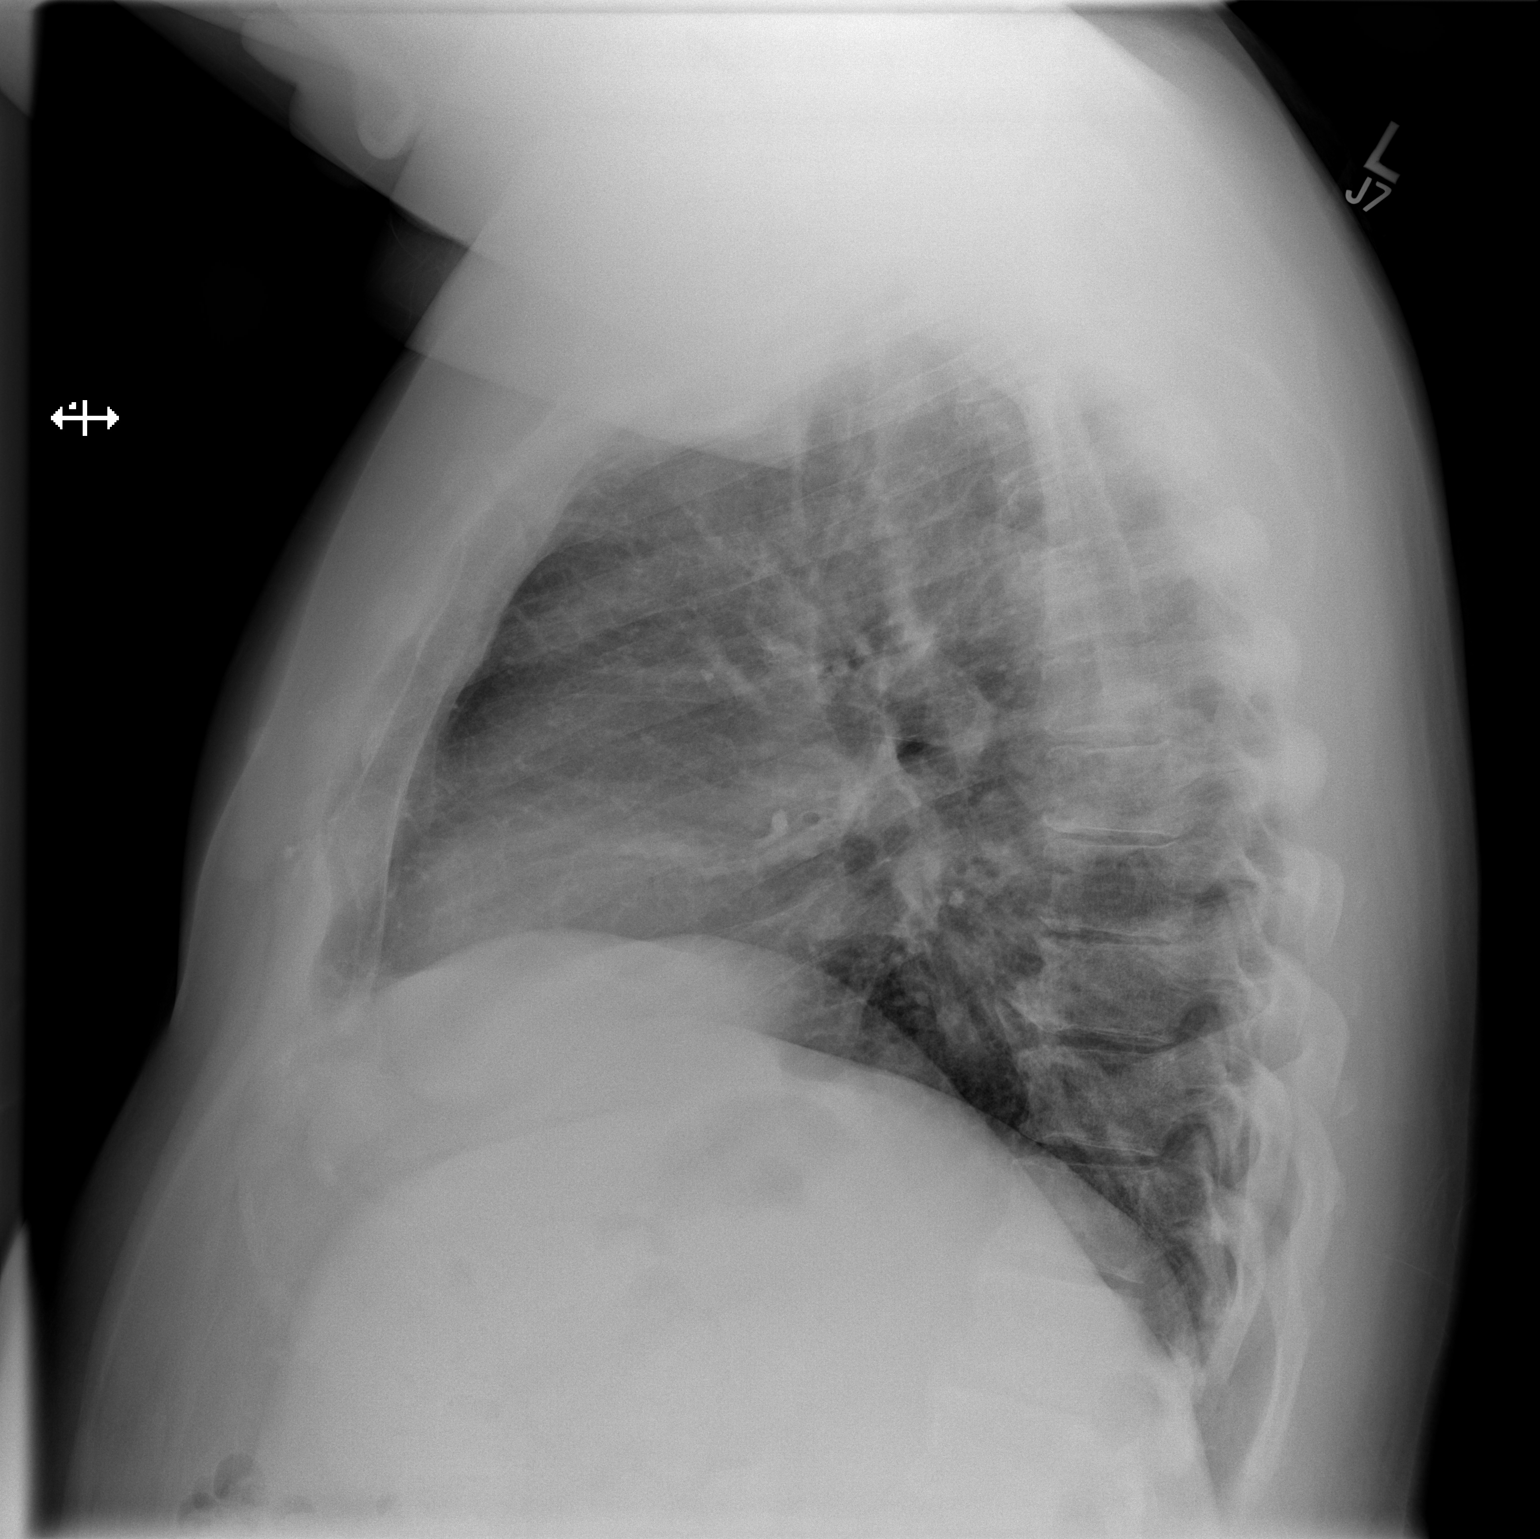

[2 of 2 positions shown; findings below may reference images not displayed]

FINDINGS: Trachea is midline.  Heart size normal.  Lungs are low in
volume but clear.  No pleural fluid.
IMPRESSION: No acute findings.

## 2014-10-10 ENCOUNTER — Encounter: Payer: Self-pay | Admitting: Family

## 2014-10-10 ENCOUNTER — Ambulatory Visit (INDEPENDENT_AMBULATORY_CARE_PROVIDER_SITE_OTHER): Payer: BLUE CROSS/BLUE SHIELD | Admitting: Family

## 2014-10-10 ENCOUNTER — Other Ambulatory Visit: Payer: Self-pay | Admitting: Family

## 2014-10-10 VITALS — BP 155/95 | HR 83 | Temp 97.8°F | Ht 67.0 in | Wt 254.0 lb

## 2014-10-10 DIAGNOSIS — I10 Essential (primary) hypertension: Secondary | ICD-10-CM

## 2014-10-10 DIAGNOSIS — J309 Allergic rhinitis, unspecified: Secondary | ICD-10-CM | POA: Insufficient documentation

## 2014-10-10 MED ORDER — FLUTICASONE PROPIONATE 50 MCG/ACT NA SUSP: 2.0000 | Freq: Every day | NASAL | Status: DC

## 2014-10-10 MED ORDER — AMLODIPINE BESYLATE 5 MG PO TABS: 5.0000 mg | ORAL_TABLET | Freq: Every day | ORAL | Status: DC

## 2014-10-10 NOTE — Patient Instructions (Addendum)
Purchase nasal saline spray and rinse sinuses twice daily. Start flonase 2 sprays each nostril once daily. Add amlodipine 5mg  once daily to your current medications.  Call if sinus symptoms worsen or if they are not improved in 1 week.  Schedule a 30 minute follow up appointment in 1 month.

## 2014-10-10 NOTE — Progress Notes (Deleted)
   Subjective:    [name], male    DOB: 12-10-1955, 59 y.o.   MRN: [id number]  HPI Jeremy [name] is here today for    Review of Systems     Objective:   Physical Exam        Assessment & Plan:

## 2014-10-10 NOTE — Assessment & Plan Note (Signed)
Advised pt:  Purchase nasal saline spray and rinse sinuses twice daily. Start flonase 2 sprays each nostril once daily. If symptoms not improved in 1 week, may need to consider abx.

## 2014-10-10 NOTE — Telephone Encounter (Signed)
Caller name:Tim Lupinacci Relationship to patient:self Can be [place]  Reason for call:Needs refill on amLODipine-benazepril (LOTREL) 5-20 MG per capsule

## 2014-10-10 NOTE — Progress Notes (Signed)
Subjective:    [name], male    DOB: 06/24/1956, 59 y.o.   MRN: [id number]  HPI  Jeremy [name] is a 59 yr old male who presents today to discuss sinus issues.  HTN-  Reports that he was diagnosed years ago.  He is maintained on lotrel and hctz.   BP Readings from Last 3 Encounters:  10/10/14 155/95  10/13/11 132/76   He has seen hematology in the past for iv iron due to iron def anemia (several years ago) reports that his last colonoscopy was in June of this year and he was told that it was normal.    See Dr Richardean Canal for bladder issues.  ?interstitial cystitis.  Reports that he has pain with urination and urinary frequency which is chronic.    Sinus- has frontal sinus pressure- started 2-3 days ago.  L ear pain.  He denies fever.  Last night he had copious nasal drainage- light green. He continues Human resources officer.   Review of Systems See HPI  Past Medical History  Diagnosis Date  . Hypertension   . Hyperlipemia   . Joint pain, knee   . Nocturia     History   Social History  . Marital Status: Married    Spouse Name: N/A  . Number of Children: N/A  . Years of Education: N/A   Occupational History  . Not on file.   Social History Main Topics  . Smoking status: Former Research scientist (life sciences)  . Smokeless tobacco: Not on file  . Alcohol Use: No  . Drug Use: No  . Sexual Activity: Not on file   Other Topics Concern  . Not on file   Social History Narrative    Past Surgical History  Procedure Laterality Date  . Nose surgery    . Knee arthroscopy  1995  . Cardiac catheterization    . Total knee arthroplasty  10/11/2011    Procedure: TOTAL KNEE ARTHROPLASTY;  Surgeon: Rudean Haskell, MD;  Location: [facility];  Service: Orthopedics;  Laterality: Right;    Family History  Problem Relation Age of Onset  . Anesthesia problems Neg Hx   . Hypotension Neg Hx   . Malignant hyperthermia Neg Hx   . Pseudochol deficiency Neg Hx     No Known Allergies  Current Outpatient  Prescriptions on File Prior to Visit  Medication Sig Dispense Refill  . amLODipine-benazepril (LOTREL) 5-20 MG per capsule Take 1 capsule by mouth daily.    Marland Kitchen b complex vitamins tablet Take 1 tablet by mouth daily.    . fexofenadine (ALLEGRA) 180 MG tablet Take 180 mg by mouth daily.    . hydrochlorothiazide (MICROZIDE) 12.5 MG capsule Take 12.5 mg by mouth daily.    . Multiple Vitamins-Minerals (MULTIVITAMINS THER. W/MINERALS) TABS Take 1 tablet by mouth daily.     No current facility-administered medications on file prior to visit.    BP 155/95 mmHg  Pulse 83  Temp(Src) 97.8 F (36.6 C) (Oral)  Ht 5\' 7"  (1.702 m)  Wt 254 lb (115.214 kg)  BMI 39.77 kg/m2  SpO2 94%       Objective:   Physical Exam  Constitutional: He is oriented to person, place, and time. He appears well-developed and well-nourished. No distress.  HENT:  Head: Normocephalic and atraumatic.  Right Ear: Tympanic membrane and ear canal normal.  Left Ear: Tympanic membrane and ear canal normal.  Nose: Right sinus exhibits no maxillary sinus tenderness and no frontal sinus tenderness. Left  sinus exhibits no maxillary sinus tenderness and no frontal sinus tenderness.  Cardiovascular: Normal rate and regular rhythm.   No murmur heard. Pulmonary/Chest: Effort normal and breath sounds normal. No respiratory distress. He has no wheezes. He has no rales.  Musculoskeletal: He exhibits no edema.  Lymphadenopathy:    He has no cervical adenopathy.  Neurological: He is alert and oriented to person, place, and time.  Skin: Skin is warm and dry.  Psychiatric: He has a normal mood and affect. His behavior is normal. Thought content normal.          Assessment & Plan:

## 2014-10-10 NOTE — Progress Notes (Signed)
Pre visit review using our clinic review tool, if applicable. No additional management support is needed unless otherwise documented below in the visit note. 

## 2014-10-10 NOTE — Assessment & Plan Note (Signed)
Uncontrolled.  Add amlodipine 5mg  (he just filled 60 days of lotrel 5/20- so will add 5 mg of amlodipine and ultimately plan to switch him to lotrel 10/20).

## 2014-10-11 ENCOUNTER — Telehealth: Payer: Self-pay | Admitting: *Deleted

## 2014-10-11 NOTE — Telephone Encounter (Signed)
Pt's daughter called stating pt was confused about his blood pressure medications. Is currently on Lotrel 5-20mg  and rx was sent for amlodipine 5mg .  Pharmacy told pt to verify that he should be taking both medication. Per 10/10/14 office note, pt had just filled 60 days worth of Lotrel so additional rx of amlodipine was sent for 5mg . Goal will be to change pt to Lotrel 10/20mg . Pt's daughter voices understanding.

## 2014-10-14 ENCOUNTER — Encounter: Payer: Self-pay | Admitting: Family

## 2014-10-14 ENCOUNTER — Ambulatory Visit (INDEPENDENT_AMBULATORY_CARE_PROVIDER_SITE_OTHER): Payer: BLUE CROSS/BLUE SHIELD | Admitting: Family

## 2014-10-14 VITALS — BP 130/80 | HR 74 | Temp 98.1°F | Resp 16

## 2014-10-14 DIAGNOSIS — J019 Acute sinusitis, unspecified: Secondary | ICD-10-CM

## 2014-10-14 MED ORDER — AMOXICILLIN-POT CLAVULANATE 875-125 MG PO TABS: 1.0000 | ORAL_TABLET | Freq: Two times a day (BID) | ORAL | Status: DC

## 2014-10-14 NOTE — Progress Notes (Signed)
Subjective:    [name], male    DOB: 02-25-1956, 59 y.o.   MRN: [id number]  HPI  Jeremy [name] is a 59 yr old male who presents with right sided facial pressure, sore throat, ear fullness. Symptoms have worsened over last 3 days. He has been using Nyquil, saline rinses.  No improvement with these measures. Denies CP/SOB.   Review of Systems See HPI  Past Medical History  Diagnosis Date  . Hypertension   . Hyperlipemia   . Joint pain, knee   . Nocturia     History   Social History  . Marital Status: Married    Spouse Name: N/A  . Number of Children: N/A  . Years of Education: N/A   Occupational History  . Not on file.   Social History Main Topics  . Smoking status: Former Research scientist (life sciences)  . Smokeless tobacco: Not on file  . Alcohol Use: No  . Drug Use: No  . Sexual Activity: Not on file   Other Topics Concern  . 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 Nose surgery    . Knee arthroscopy  1995  . Cardiac catheterization    . Total knee arthroplasty  10/11/2011    Procedure: TOTAL KNEE ARTHROPLASTY;  Surgeon: Rudean Haskell, MD;  Location: [facility];  Service: Orthopedics;  Laterality: Right;    Family History  Problem Relation Age of Onset  . Anesthesia problems Neg Hx   . Hypotension Neg Hx   . Malignant hyperthermia Neg Hx   . Pseudochol deficiency Neg Hx     No Known Allergies  Current Outpatient Prescriptions on File Prior to Visit  Medication Sig Dispense Refill  . amLODipine (NORVASC) 5 MG tablet Take 1 tablet (5 mg total) by mouth daily. 30 tablet 1  . amLODipine-benazepril (LOTREL) 5-20 MG per capsule TAKE ONE CAPSULE BY MOUTH EVERY DAY 30 capsule 0  . b complex vitamins tablet Take 1 tablet by mouth daily.    .  fexofenadine (ALLEGRA) 180 MG tablet Take 180 mg by mouth daily.    . fluticasone (FLONASE) 50 MCG/ACT nasal spray Place 2 sprays into both nostrils daily. 16 g 0  . hydrochlorothiazide (MICROZIDE) 12.5 MG capsule Take 12.5 mg by mouth daily.    . Multiple Vitamins-Minerals (MULTIVITAMINS THER. W/MINERALS) TABS Take 1 tablet by mouth daily.    Marland Kitchen omeprazole (PRILOSEC) 40 MG capsule Take 40 mg by mouth daily.     No current facility-administered medications on file prior to visit.    BP 130/80 mmHg  Pulse 74  Temp(Src) 98.1 F (36.7 C) (Oral)  Resp 16  SpO2 93%       Objective:   Physical Exam  Constitutional: He is oriented to person, place, and time. He appears well-developed and well-nourished. No distress.  HENT:  Head: Normocephalic and atraumatic.  Right Ear: Tympanic membrane and ear canal normal.  Left Ear: Tympanic membrane and ear canal normal.  Nose: Left sinus exhibits maxillary sinus tenderness and frontal sinus tenderness.  Neck: No JVD present. No tracheal deviation present. No thyromegaly present.  Cardiovascular: Normal rate and regular rhythm.   No murmur heard. Pulmonary/Chest: Effort normal and breath sounds normal. No respiratory  distress. He has no wheezes. He has no rales.  Musculoskeletal: He exhibits no edema.  Lymphadenopathy:    He has no cervical adenopathy.  Neurological: He is alert and oriented to person, place, and time.  Skin: Skin is warm and dry.  Psychiatric: He has a normal mood and affect. His behavior is normal. Thought content normal.          Assessment & Plan:

## 2014-10-14 NOTE — Progress Notes (Signed)
Pre visit review using our clinic review tool, if applicable. No additional management support is needed unless otherwise documented below in the visit note. 

## 2014-10-14 NOTE — Assessment & Plan Note (Signed)
No improvement despite supportive measures. Will rx with augmentin, follow up if symptoms worsen or if symptoms do not improve.

## 2014-10-14 NOTE — Patient Instructions (Signed)
Start augmentin.  Call if symptoms worsen or if not improved in 2-3 days.  Sinusitis Sinusitis is redness, soreness, and puffiness (inflammation) of the air pockets in the bones of your face (sinuses). The redness, soreness, and puffiness can cause air and mucus to get trapped in your sinuses. This can allow germs to grow and cause an infection.  HOME CARE   Drink enough fluids to keep your pee (urine) clear or pale yellow.  Use a humidifier in your home.  Run a hot shower to create steam in the bathroom. Sit in the bathroom with the door closed. Breathe in the steam 3-4 times a day.  Put a warm, moist washcloth on your face 3-4 times a day, or as told by your doctor.  Use salt water sprays (saline sprays) to wet the thick fluid in your nose. This can help the sinuses drain.  Only take medicine as told by your doctor. GET HELP RIGHT AWAY IF:   Your pain gets worse.  You have very bad headaches.  You are sick to your stomach (nauseous).  You throw up (vomit).  You are very sleepy (drowsy) all the time.  Your face is puffy (swollen).  Your vision changes.  You have a stiff neck.  You have trouble breathing. MAKE SURE YOU:   Understand these instructions.  Will watch your condition.  Will get help right away if you are not doing well or get worse. Document Released: 01/26/2008 Document Revised: 05/03/2012 Document Reviewed: 03/14/2012 [facility] Patient Information 2015 [place], [place]. This information is not intended to replace advice given to you by your health care provider. Make sure you discuss any questions you have with your health care provider.

## 2014-10-16 ENCOUNTER — Telehealth: Payer: Self-pay | Admitting: Family

## 2014-10-16 NOTE — Telephone Encounter (Signed)
Notified pts daughter

## 2014-10-16 NOTE — Telephone Encounter (Signed)
Attempted to reach pt at cell# listed and received message that # is out of service. Left message to return my call on home #.

## 2014-10-16 NOTE — Telephone Encounter (Signed)
Patient's Urologist Dr Venia Minks gave the patient myrbetriq 50 mg he is to take 1 per day.  One of the warnings with the med is elevated bp.  Should he take this

## 2014-10-16 NOTE — Telephone Encounter (Signed)
Yes. We will repeat his BP at his upcoming visit.

## 2014-10-31 ENCOUNTER — Telehealth: Payer: Self-pay | Admitting: *Deleted

## 2014-10-31 NOTE — Telephone Encounter (Signed)
Medical records received via mail from [facility]. Forwarded to [employer]. JG//[id number]

## 2014-11-05 ENCOUNTER — Encounter: Payer: Self-pay | Admitting: Family

## 2014-11-05 ENCOUNTER — Encounter: Payer: Self-pay | Admitting: *Deleted

## 2014-11-06 ENCOUNTER — Encounter: Payer: Self-pay | Admitting: Family

## 2014-11-08 ENCOUNTER — Telehealth: Payer: Self-pay | Admitting: *Deleted

## 2014-11-08 MED ORDER — AMLODIPINE BESY-BENAZEPRIL HCL 5-20 MG PO CAPS: 1.0000 | ORAL_CAPSULE | Freq: Every day | ORAL | Status: DC

## 2014-11-08 NOTE — Telephone Encounter (Signed)
Daughter called stating pt needs refill of Lotrel. Pt has appt on 11/11/14. 30 day supply sent.

## 2014-11-11 ENCOUNTER — Ambulatory Visit (INDEPENDENT_AMBULATORY_CARE_PROVIDER_SITE_OTHER): Payer: BLUE CROSS/BLUE SHIELD | Admitting: Family

## 2014-11-11 ENCOUNTER — Encounter: Payer: Self-pay | Admitting: Family

## 2014-11-11 VITALS — BP 137/88 | HR 66 | Temp 98.2°F | Resp 18 | Ht 67.0 in | Wt 247.6 lb

## 2014-11-11 DIAGNOSIS — I10 Essential (primary) hypertension: Secondary | ICD-10-CM

## 2014-11-11 DIAGNOSIS — Z0000 Encounter for general adult medical examination without abnormal findings: Secondary | ICD-10-CM | POA: Diagnosis not present

## 2014-11-11 DIAGNOSIS — R739 Hyperglycemia, unspecified: Secondary | ICD-10-CM

## 2014-11-11 DIAGNOSIS — M545 Low back pain: Secondary | ICD-10-CM

## 2014-11-11 DIAGNOSIS — Z23 Encounter for immunization: Secondary | ICD-10-CM | POA: Diagnosis not present

## 2014-11-11 DIAGNOSIS — D649 Anemia, unspecified: Secondary | ICD-10-CM

## 2014-11-11 NOTE — Patient Instructions (Addendum)
Please schedule a lab visit at the front desk. You will be contacted about your referral to physical therapy. Please complete stool kit and return by mail at your earliest convenience. Follow up in 3 months.  

## 2014-11-11 NOTE — Progress Notes (Signed)
Subjective:    [name], male    DOB: 02-Mar-1956, 59 y.o.   MRN: [id number]  HPI  [name] is a 59 yr old male who presents today for follow up. Reports improvement in facial pressure, denies fevers/nausea/vomitting, diarrhea.  HTN- Patient is currently maintained on the following medications for blood pressure: hctz, lotrel, amlodipine Patient reports good compliance with blood pressure medications. Patient denies chest pain, shortness of breath or swelling. 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 Reports that he gives blood every 56 days. He reports that he has had a colonoscopy in June 2015 normal per patient.  His last blood donation was about 1 year ago.   Sugar was also mildly elevated.    He had a normal PSA and lipid panel was stable at that time.  Patient presents today for complete physical.  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 Sees Dr. Venia Minks. Immunizations: Diet: Needs improvement.   Breakfast- egg sandwich coffee Lunch sandwich (ham/cheese/may) banana, granola bar 2 small bags of potato chips Dinner- meat, veg, mac and cheese Drinks water or lemonade.   No longer snacking at night Wt Readings from Last 3 Encounters:  11/11/14 247 lb 9.6 oz (112.311 kg)  10/10/14 254 lb (115.214 kg)  10/12/11 220 lb 14.4 oz (100.2 kg)  Exercise:  Rowing machine, elliptical and treadmill Colonoscopy- up to date PSA up to date normal.    :Review of Systems  Constitutional: Negative for unexpected weight change.  HENT: Negative for rhinorrhea.   Respiratory:       Occasional cough  Cardiovascular: Negative for chest pain.  Gastrointestinal: Negative for nausea, vomiting and diarrhea.  Genitourinary:  Negative for dysuria.       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 Reports pain is non-radiating.    Neurological: Negative for headaches.  Hematological: Negative for adenopathy.  Psychiatric/Behavioral:       Denies current depression/anxiety       Past Medical History  Diagnosis Date  . Hypertension   . Hyperlipemia   . Joint pain, knee   . Nocturia   . Lung nodule   . GERD (gastroesophageal reflux disease)   . Anxiety   . Depression   . GERD (gastroesophageal reflux disease)   . Gastric nodule   . OSA (obstructive sleep apnea)     History   Social History  . Marital Status: Married    Spouse Name: N/A  . Number of Children: N/A  . Years of Education: N/A   Occupational History  . Not on file.   Social History Main Topics  . Smoking status: Former Research scientist (life sciences)  . Smokeless tobacco: Not on file  . Alcohol Use: No  . Drug Use: No  . Sexual Activity: Not on file   Other Topics Concern  . 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 Nose surgery    . Knee arthroscopy  1995  . Cardiac catheterization    . 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 Anesthesia problems Neg Hx   . Hypotension Neg Hx   . Malignant hyperthermia Neg Hx   . Pseudochol deficiency Neg Hx   . Stroke Mother   . Hypertension Mother     No Known Allergies  Current Outpatient Prescriptions on File Prior to Visit  Medication Sig Dispense Refill  . amLODipine  (NORVASC) 5 MG tablet Take 1 tablet (5 mg total) by mouth daily. 30 tablet 1  . amLODipine-benazepril (LOTREL) 5-20 MG per capsule Take 1 capsule by mouth daily. 30 capsule 0  . b complex vitamins tablet Take 1 tablet by mouth daily.    . fexofenadine (ALLEGRA) 180 MG tablet Take 180 mg by mouth daily.    . fluticasone (FLONASE) 50 MCG/ACT nasal spray Place 2 sprays into both nostrils daily. 16 g 0  . hydrochlorothiazide (MICROZIDE) 12.5 MG capsule Take 12.5 mg by mouth daily.    . Multiple Vitamins-Minerals (MULTIVITAMINS THER. W/MINERALS) TABS Take 1 tablet by mouth daily.    Marland Kitchen omeprazole (PRILOSEC) 40 MG capsule Take 40 mg by mouth daily.     No current facility-administered medications on file prior to visit.    BP 137/88 mmHg  Pulse 66  Temp(Src) 98.2 F (36.8 C) (Oral)  Resp 18  Ht 5\' 7"  (1.702 m)  Wt 247 lb 9.6 oz (112.311 kg)  BMI 38.77 kg/m2  SpO2 95%    Objective:   Physical Exam Physical Exam  Constitutional: He is oriented to person, place, and time. He appears well-developed and well-nourished. No distress.  HENT:  Head: Normocephalic and atraumatic.  Right Ear: Tympanic membrane and ear canal normal.  Left Ear: Tympanic membrane and ear canal normal.  Mouth/Throat: Oropharynx is clear and moist.  Eyes: Pupils are equal, round, and reactive to light. No scleral icterus.  Neck: Normal range of motion. No thyromegaly present.  Cardiovascular: Normal rate and regular rhythm.   No murmur heard. Pulmonary/Chest: Effort normal and breath sounds normal. No respiratory distress. He has no wheezes. He has no rales. He exhibits no tenderness.  Abdominal: Soft. Bowel sounds are normal. He exhibits no distension and no mass. There is no tenderness. There is no rebound and no guarding.  Musculoskeletal: He exhibits no edema.  Lymphadenopathy:    He has no cervical adenopathy.  Neurological: He is alert and oriented to person, place, and time. He has normal reflexes. He  exhibits normal muscle tone. Coordination normal.  Skin: Skin is warm and dry.  Psychiatric: He has a normal mood and affect. His behavior is normal. Judgment and thought content normal.          Assessment & Plan:          Assessment & Plan:

## 2014-11-11 NOTE — Progress Notes (Signed)
Pre visit review using our clinic review tool, if applicable. No additional management support is needed unless otherwise documented below in the visit note. 

## 2014-11-12 ENCOUNTER — Other Ambulatory Visit (INDEPENDENT_AMBULATORY_CARE_PROVIDER_SITE_OTHER): Payer: BLUE CROSS/BLUE SHIELD

## 2014-11-12 DIAGNOSIS — D649 Anemia, unspecified: Secondary | ICD-10-CM

## 2014-11-12 LAB — CBC WITH DIFFERENTIAL/PLATELET
BASOS ABS: 0 10*3/uL (ref 0.0–0.1)
BASOS PCT: 0.6 % (ref 0.0–3.0)
Eosinophils Absolute: 0.2 10*3/uL (ref 0.0–0.7)
Eosinophils Relative: 2.7 % (ref 0.0–5.0)
HEMATOCRIT: 37.4 % — AB (ref 39.0–52.0)
HEMOGLOBIN: 12.4 g/dL — AB (ref 13.0–17.0)
LYMPHS ABS: 2 10*3/uL (ref 0.7–4.0)
LYMPHS PCT: 30.1 % (ref 12.0–46.0)
MCHC: 33 g/dL (ref 30.0–36.0)
MCV: 76.6 fl — ABNORMAL LOW (ref 78.0–100.0)
Monocytes Absolute: 0.6 10*3/uL (ref 0.1–1.0)
Monocytes Relative: 8.5 % (ref 3.0–12.0)
NEUTROS ABS: 3.9 10*3/uL (ref 1.4–7.7)
Neutrophils Relative %: 58.1 % (ref 43.0–77.0)
Platelets: 306 10*3/uL (ref 150.0–400.0)
RBC: 4.88 Mil/uL (ref 4.22–5.81)
RDW: 17.5 % — AB (ref 11.5–15.5)
WBC: 6.7 10*3/uL (ref 4.0–10.5)

## 2014-11-12 LAB — IRON: Iron: 51 ug/dL (ref 42–165)

## 2014-11-12 LAB — VITAMIN B12: Vitamin B-12: 751 pg/mL (ref 211–911)

## 2014-11-12 LAB — FOLATE

## 2014-11-15 ENCOUNTER — Telehealth: Payer: Self-pay | Admitting: Family

## 2014-11-15 NOTE — Telephone Encounter (Signed)
Please let pt know that iron, b12 and folate are normal.  I will see how his stool sample looks.  If blood in stool we will plan to send to GI for further eval. If negative for blood in the stool then we will plan to send to hematology for further evaluation of his anemia.

## 2014-11-16 ENCOUNTER — Telehealth: Payer: Self-pay | Admitting: Family

## 2014-11-16 DIAGNOSIS — R739 Hyperglycemia, unspecified: Secondary | ICD-10-CM

## 2014-11-16 DIAGNOSIS — D649 Anemia, unspecified: Secondary | ICD-10-CM | POA: Insufficient documentation

## 2014-11-16 DIAGNOSIS — Z0000 Encounter for general adult medical examination without abnormal findings: Secondary | ICD-10-CM | POA: Insufficient documentation

## 2014-11-16 HISTORY — DX: Hyperglycemia, unspecified: R73.9

## 2014-11-16 HISTORY — DX: Anemia, unspecified: D64.9

## 2014-11-16 NOTE — Assessment & Plan Note (Signed)
A1C obtained:  No results found for: HGBA1C

## 2014-11-16 NOTE — Telephone Encounter (Signed)
For some reason A1C was not drawn.  Is it possible to add on to his labs from last visit?  Otherwise, can we ask him to return to lab- non-urgently

## 2014-11-16 NOTE — Assessment & Plan Note (Signed)
B12, iron, folate normal. Await IFOB.  If + GI referral, if -neg, hematology referral.

## 2014-11-16 NOTE — Assessment & Plan Note (Signed)
BP stable on current meds. Continue same.  

## 2014-11-16 NOTE — Assessment & Plan Note (Signed)
Discussed diet, exercise, weight loss. Tdap today.  Defer PSA testing to urology.

## 2014-11-19 ENCOUNTER — Other Ambulatory Visit (INDEPENDENT_AMBULATORY_CARE_PROVIDER_SITE_OTHER): Payer: BLUE CROSS/BLUE SHIELD

## 2014-11-19 DIAGNOSIS — R739 Hyperglycemia, unspecified: Secondary | ICD-10-CM

## 2014-11-19 NOTE — Telephone Encounter (Signed)
Patient in office.  Notified him of results.  Patient went to lab to have A1C drawn.  eal

## 2014-11-19 NOTE — Telephone Encounter (Signed)
Pt to come back today for A1c.

## 2014-11-19 NOTE — Telephone Encounter (Signed)
Called the patient left message to call back 

## 2014-11-19 NOTE — Telephone Encounter (Signed)
Wife aware

## 2014-11-20 ENCOUNTER — Other Ambulatory Visit (INDEPENDENT_AMBULATORY_CARE_PROVIDER_SITE_OTHER): Payer: BLUE CROSS/BLUE SHIELD

## 2014-11-20 DIAGNOSIS — D649 Anemia, unspecified: Secondary | ICD-10-CM | POA: Diagnosis not present

## 2014-11-20 LAB — HEMOGLOBIN A1C: Hgb A1c MFr Bld: 6.1 % (ref 4.6–6.5)

## 2014-11-20 NOTE — Addendum Note (Signed)
Addended by: Kelle Darting A on: 11/20/2014 11:26 AM   Modules accepted: Orders

## 2014-11-21 LAB — FECAL OCCULT BLOOD, IMMUNOCHEMICAL: Fecal Occult Bld: NEGATIVE

## 2014-11-22 ENCOUNTER — Telehealth: Payer: Self-pay | Admitting: Family

## 2014-11-22 NOTE — Telephone Encounter (Signed)
Left message for pt to return my call.

## 2014-11-22 NOTE — Telephone Encounter (Signed)
Notified pt's daughter and gave copy of below recommendations to her to give to pt.

## 2014-11-22 NOTE — Telephone Encounter (Signed)
Please contact pt and let him know that his lab work shows pre-diabetes.  Recommend regular exercise, weight loss, and work on avoiding concentrated sweets, and limiting white carbs (rice/bread/pasta/potatoes). Instead substitute whole grain versions with reasonable portions.

## 2014-11-25 ENCOUNTER — Ambulatory Visit: Payer: BLUE CROSS/BLUE SHIELD | Admitting: Rehabilitation

## 2014-12-02 ENCOUNTER — Other Ambulatory Visit: Payer: BLUE CROSS/BLUE SHIELD

## 2014-12-03 ENCOUNTER — Other Ambulatory Visit: Payer: BLUE CROSS/BLUE SHIELD

## 2014-12-05 ENCOUNTER — Ambulatory Visit: Payer: BLUE CROSS/BLUE SHIELD | Attending: Family | Admitting: Physical Therapy

## 2014-12-05 DIAGNOSIS — M545 Low back pain, unspecified: Secondary | ICD-10-CM

## 2014-12-05 DIAGNOSIS — R531 Weakness: Secondary | ICD-10-CM | POA: Insufficient documentation

## 2014-12-05 NOTE — Patient Instructions (Signed)
On Elbows (Prone)   Rise up on elbows as high as possible, keeping hips on floor. Hold __3-5__ minutes. Repeat __2-4__ times per set. Do __1__ sets per session. Do __2-3__ sessions per day.  http://orth.exer.us/92   Copyright  VHI. All rights reserved.   Bridging   Slowly raise buttocks from floor, keeping stomach tight.  Hold 5 seconds. Repeat __10_ times per set. Do _1___ sets per session. Do __2-3__ sessions per day.  http://orth.exer.us/1096   Copyright  VHI. All rights reserved.   Jeremy [name], PT, DPT 12/05/2014 8:31 AM  [facility] [place],  [address]  [phone] (office) [phone] (fax)

## 2014-12-05 NOTE — Therapy (Signed)
[facility] [address]  [place], [place], [address] Phone: [phone]   Fax:  [phone]  Physical Therapy Evaluation  Patient Details  Name: Jeremy [name] MRN: [id number] Date of Birth: 06/10/1956 Referring Provider:  [name], NP  Encounter Date: 12/05/2014      PT End of Session - 12/05/14 0839    Visit Number 1   Number of Visits 12   Date for PT Re-Evaluation 01/16/15   PT Start Time 0801   PT Stop Time 0835   PT Time Calculation (min) 34 min   Activity Tolerance Patient tolerated treatment well   Behavior During Therapy [facility] for tasks assessed/performed      Past Medical History  Diagnosis Date  . Hypertension   . Hyperlipemia   . Joint pain, knee   . Nocturia   . Lung nodule   . GERD (gastroesophageal reflux disease)   . Anxiety   . Depression   . GERD (gastroesophageal reflux disease)   . Gastric nodule   . OSA (obstructive sleep apnea)     Past Surgical History  Procedure Laterality Date  . Nose surgery    . Knee arthroscopy  1995  . Cardiac catheterization    . Total knee arthroplasty  10/11/2011    Procedure: TOTAL KNEE ARTHROPLASTY;  Surgeon: Rudean Haskell, MD;  Location: [place];  Service: Orthopedics;  Laterality: Right;    There were no vitals filed for this visit.  Visit Diagnosis:  Midline low back pain without sciatica - Plan: PT plan of care cert/re-cert  Generalized weakness - Plan: PT plan of care cert/re-cert      Subjective Assessment - 12/05/14 0803    Subjective Pt is a 59 y/o male who presents to OPPT for low back pain.  Pt reports hx "slipped disc" in back > 5 years ago.  Pt reports catching sensation when bending over.     Limitations Walking   How long can you walk comfortably? 1 hour   Diagnostic tests MRI ("may be 10 years or more") revealed bulging disc   Patient Stated Goals decrease pain   Currently in Pain? No/denies   Pain Radiating  Towards L hip   Aggravating Factors  bending forward, lifting   Pain Relieving Factors ibuprofen, walking            [facility] PT Assessment - 12/05/14 0807    Assessment   Medical Diagnosis LBP   Onset Date --  5-10 years ago   Next MD Visit PRN   Prior Therapy none for back   Precautions   Precautions None   Restrictions   Weight Bearing Restrictions No   Balance Screen   Has the patient fallen in the past 6 months No   Has the patient had a decrease in activity level because of a fear of falling?  No   Is the patient reluctant to leave their home because of a fear of falling?  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     Leisure play golf   Cognition   Overall Cognitive Status Within Functional Limits for tasks assessed   Observation/Other Assessments   Focus on Therapeutic Outcomes (FOTO)  63 (37% limited; predicted 29% limited)   Posture/Postural Control   Posture/Postural Control Postural limitations   Postural Limitations Decreased lumbar lordosis   AROM   Overall AROM Comments lumbar WNL with reports of pulling   Strength   Strength Assessment Site Hip;Knee;Ankle   Right Hip Flexion 4-/5   Right Hip Extension 3+/5   Right Hip ABduction 3+/5   Right Hip ADduction 5/5   Left Hip Flexion 4-/5   Left Hip Extension 3+/5   Left Hip ABduction 3+/5   Left Hip ADduction 5/5   Right/Left Knee Right;Left   Right Knee Flexion 4/5   Right Knee Extension 5/5   Left Knee Flexion 4/5   Left Knee Extension 5/5   Right Ankle Dorsiflexion 5/5   Left Ankle Dorsiflexion 5/5   Flexibility   Soft Tissue Assessment /Muscle Length yes   Hamstrings tightness bil   Piriformis tightness bil   Special  Tests    Special Tests Lumbar  SLR negative bil                           PT Education - 12/05/14 0838    Education provided Yes   Education Details HEP; clinical findings, goals of care, POC   Person(s) Educated Patient   Methods Explanation;Handout   Comprehension Verbalized understanding;Need further instruction             PT Long Term Goals - 12/05/14 0841    PT LONG TERM GOAL #1   Title independent with HEP (01/16/15)   Time 6   Period Weeks   Status New   PT LONG TERM GOAL #2   Title verbalize understanding of posture/body mechanics to reduce risk of reinjury (01/16/15)   Time 6   Period Weeks   Status New   PT LONG TERM GOAL #3   Title report ability to perform work tasks without increase in pain (01/16/15)   Time 6   Period Weeks   Status New               Plan - 12/05/14 8841    Clinical Impression Statement Pt presents to OPPT with low back pain most likely due to heavy lifting and poor mechanics.  Pt demonstrates core and hip weakness and will benefit from PT to maximize function and decrease pain.   Pt will benefit from skilled therapeutic intervention in order to improve on the following deficits Improper body mechanics;Postural dysfunction;Pain;Decreased strength   Rehab Potential Good   PT Frequency 2x / week   PT Duration 6 weeks  anticipate d/c in 4 weeks   PT Treatment/Interventions ADLs/Self Care Home Management;Moist Heat;Electrical Stimulation;Cryotherapy;Traction;Ultrasound;Functional mobility training;Patient/family education;Therapeutic activities;Manual techniques;Therapeutic exercise   PT Next Visit Plan review HEP, core/hip stability HEP, modalities PRN   Consulted and Agree with Plan of Care Patient         Problem List Patient Active Problem List   Diagnosis Date Noted  . Anemia 11/16/2014  . Preventative health care 11/16/2014  . Hyperglycemia 11/16/2014  . Sinusitis, acute 10/14/2014  . HTN  (hypertension) 10/10/2014  . Allergic rhinitis 10/10/2014   Laureen Abrahams, PT, DPT 12/05/2014 8:43 AM  [facility] [address]  [place], [place], [address] Phone: [phone]   Fax:  [phone]

## 2014-12-09 ENCOUNTER — Ambulatory Visit: Payer: BLUE CROSS/BLUE SHIELD | Admitting: Rehabilitation

## 2014-12-09 DIAGNOSIS — M545 Low back pain, unspecified: Secondary | ICD-10-CM

## 2014-12-09 DIAGNOSIS — R531 Weakness: Secondary | ICD-10-CM

## 2014-12-09 NOTE — Therapy (Signed)
[facility] [address]  [place], [place], [address] Phone: [phone]   Fax:  [phone]  Physical Therapy Treatment  Patient Details  Name: Jeremy [name] MRN: [id number] Date of Birth: Feb 27, 1956 Referring Provider:  [name], NP  Encounter Date: 12/09/2014      PT End of Session - 12/09/14 1529    Visit Number 2   Number of Visits 12   Date for PT Re-Evaluation 01/16/15   PT Start Time 3976   PT Stop Time 1609   PT Time Calculation (min) 39 min      Past Medical History  Diagnosis Date  . Hypertension   . Hyperlipemia   . Joint pain, knee   . Nocturia   . Lung nodule   . GERD (gastroesophageal reflux disease)   . Anxiety   . Depression   . GERD (gastroesophageal reflux disease)   . Gastric nodule   . OSA (obstructive sleep apnea)     Past Surgical History  Procedure Laterality Date  . Nose surgery    . Knee arthroscopy  1995  . Cardiac catheterization    . Total knee arthroplasty  10/11/2011    Procedure: TOTAL KNEE ARTHROPLASTY;  Surgeon: Rudean Haskell, MD;  Location: [place];  Service: Orthopedics;  Laterality: Right;    There were no vitals filed for this visit.  Visit Diagnosis:  Midline low back pain without sciatica  Generalized weakness      Subjective Assessment - 12/09/14 1530    Subjective Reports exercises are going well, slight pain but states that he is feeling better. Was able to play golf the other day without any hip or back pain.    Currently in Pain? Yes   Pain Score --  4-5/10   Pain Location Back                         [facility] Adult PT Treatment/Exercise - 12/09/14 1532    Exercises   Exercises Lumbar;Knee/Hip   Lumbar Exercises: Stretches   Passive Hamstring Stretch 3 reps;20 seconds  bilateral   Single Knee to Chest Stretch 3 reps;20 seconds  bilateral   Lower Trunk Rotation --  bilateral 5" hold x10   Prone on Elbows  Stretch 1 rep;60 seconds   Press Ups 10 seconds   Quad Stretch 3 reps;30 seconds  bilateral, prone   Piriformis Stretch 3 reps;30 seconds  bilateral   Lumbar Exercises: Aerobic   Stationary Bike level 1x5   Lumbar Exercises: Standing   Functional Squats 10 reps;2 seconds   Lumbar Exercises: Supine   Clam 15 reps  green TB with TrA   Bridge 10 reps   Straight Leg Raise 10 reps  with TrA   Lumbar Exercises: Prone   Straight Leg Raise 10 reps  alternating   Other Prone Lumbar Exercises Press ups x10                     PT Long Term Goals - 12/09/14 1609    PT LONG TERM GOAL #1   Title independent with HEP (01/16/15)   Status On-going   PT LONG TERM GOAL #2   Title verbalize understanding of posture/body mechanics to reduce risk of reinjury (01/16/15)   Status On-going   PT LONG TERM GOAL #3   Title report ability to perform work tasks without increase in pain (01/16/15)   Status On-going  Plan - 12/09/14 1607    Clinical Impression Statement Good tolerance to exercises and compliance with HEP. Noted some knee discomfort with prone quad stretch and squats (history of knee surgery)   PT Next Visit Plan Continue core/hip stability   Consulted and Agree with Plan of Care Patient        Problem List Patient Active Problem List   Diagnosis Date Noted  . Anemia 11/16/2014  . Preventative health care 11/16/2014  . Hyperglycemia 11/16/2014  . Sinusitis, acute 10/14/2014  . HTN (hypertension) 10/10/2014  . Allergic rhinitis 10/10/2014    Barbette Hair, PTA 12/09/2014, 4:09 PM  [facility] [address]  [place], [place], [address] Phone: [phone]   Fax:  [phone]

## 2014-12-16 ENCOUNTER — Telehealth: Payer: Self-pay | Admitting: Family

## 2014-12-16 ENCOUNTER — Ambulatory Visit: Payer: BLUE CROSS/BLUE SHIELD | Admitting: Rehabilitation

## 2014-12-16 DIAGNOSIS — R531 Weakness: Secondary | ICD-10-CM

## 2014-12-16 DIAGNOSIS — M545 Low back pain, unspecified: Secondary | ICD-10-CM

## 2014-12-16 MED ORDER — AMLODIPINE BESYLATE 5 MG PO TABS: 5.0000 mg | ORAL_TABLET | Freq: Every day | ORAL | Status: DC

## 2014-12-16 NOTE — Telephone Encounter (Signed)
Last office noted says to continue current medications.  Please advise?

## 2014-12-16 NOTE — Telephone Encounter (Signed)
No, continue 5mg  of amlodipine please. Refill sent.

## 2014-12-16 NOTE — Therapy (Signed)
[facility] [address]  [place], [place], [address] Phone: [phone]   Fax:  [phone]  Physical Therapy Treatment  Patient Details  Name: Jeremy [name] MRN: [id number] Date of Birth: 10/11/1955 Referring Provider:  [name], NP  Encounter Date: 12/16/2014      PT End of Session - 12/16/14 1536    Visit Number 3   Number of Visits 12   Date for PT Re-Evaluation 01/16/15   PT Start Time 1534   PT Stop Time 1610   PT Time Calculation (min) 36 min   Activity Tolerance Patient tolerated treatment well   Behavior During Therapy [facility] for tasks assessed/performed      Past Medical History  Diagnosis Date  . Hypertension   . Hyperlipemia   . Joint pain, knee   . Nocturia   . Lung nodule   . GERD (gastroesophageal reflux disease)   . Anxiety   . Depression   . GERD (gastroesophageal reflux disease)   . Gastric nodule   . OSA (obstructive sleep apnea)     Past Surgical History  Procedure Laterality Date  . Nose surgery    . Knee arthroscopy  1995  . Cardiac catheterization    . Total knee arthroplasty  10/11/2011    Procedure: TOTAL KNEE ARTHROPLASTY;  Surgeon: Rudean Haskell, MD;  Location: [place];  Service: Orthopedics;  Laterality: Right;    There were no vitals filed for this visit.  Visit Diagnosis:  Midline low back pain without sciatica  Generalized weakness      Subjective Assessment - 12/16/14 1536    Subjective States he is still doing well, only slight discomfort noted. 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 So that has helped him to rest his back.    Limitations Lifting   How long can you walk comfortably? Reports back doesn't limit him from walking anymore   Currently in Pain? No/denies                         [facility] Adult PT Treatment/Exercise - 12/16/14 1536    Lumbar Exercises: Stretches   Passive Hamstring  Stretch 3 reps;20 seconds  bilateral   Single Knee to Chest Stretch 3 reps;20 seconds  bilateral   Lower Trunk Rotation --  5" hold x10   Quad Stretch 3 reps;20 seconds  bilateral   Piriformis Stretch 3 reps;20 seconds  bilateral   Lumbar Exercises: Aerobic   Stationary Bike level 2x5   Lumbar Exercises: Seated   Other Seated Lumbar Exercises Alt UE/LE x10 with TrA   Lumbar Exercises: Supine   Bridge 10 reps  black TB around knees   Straight Leg Raise --  x12 reps with TrA   Other Supine Lumbar Exercises TrA + march with black tb x12   Lumbar Exercises: Sidelying   Hip Abduction 10 reps  bilateral   Lumbar Exercises: Prone   Straight Leg Raise --  x12, alternating   Other Prone Lumbar Exercises Press ups x10                     PT Long Term Goals - 12/09/14 1609    PT LONG TERM GOAL #1   Title independent with HEP (01/16/15)   Status On-going   PT LONG TERM GOAL #2   Title verbalize understanding of posture/body mechanics to reduce risk  of reinjury (01/16/15)   Status On-going   PT LONG TERM GOAL #3   Title report ability to perform work tasks without increase in pain (01/16/15)   Status On-going               Plan - 12/16/14 1608    Clinical Impression Statement Excellent progress with core activities today. May increase HEP at next visit.    PT Next Visit Plan Continue core/hip stability   Consulted and Agree with Plan of Care Patient        Problem List Patient Active Problem List   Diagnosis Date Noted  . Anemia 11/16/2014  . Preventative health care 11/16/2014  . Hyperglycemia 11/16/2014  . Sinusitis, acute 10/14/2014  . HTN (hypertension) 10/10/2014  . Allergic rhinitis 10/10/2014    Barbette Hair, PTA 12/16/2014, 4:12 PM  [facility] [address]  [place], [place], [address] Phone: [phone]   Fax:  [phone]

## 2014-12-16 NOTE — Telephone Encounter (Signed)
He thought Jeremy [name] was going to change his dose of amlodipine  Please call and let him know

## 2014-12-17 NOTE — Telephone Encounter (Signed)
Left detailed message on home# and to call if any questions. 

## 2014-12-19 ENCOUNTER — Ambulatory Visit: Payer: BLUE CROSS/BLUE SHIELD | Admitting: Physical Therapy

## 2014-12-19 DIAGNOSIS — R531 Weakness: Secondary | ICD-10-CM

## 2014-12-19 DIAGNOSIS — M545 Low back pain, unspecified: Secondary | ICD-10-CM

## 2014-12-19 NOTE — Therapy (Signed)
[facility] [address]  [place], [place], [address] Phone: [phone]   Fax:  [phone]  Physical Therapy Treatment  Patient Details  Name: Jeremy [name] MRN: [id number] Date of Birth: 07/18/1956 Referring Provider:  [name], NP  Encounter Date: 12/19/2014      PT End of Session - 12/19/14 1609    Visit Number 4   Number of Visits 12   Date for PT Re-Evaluation 01/16/15   PT Start Time 2035   PT Stop Time 1609   PT Time Calculation (min) 39 min   Activity Tolerance Patient tolerated treatment well   Behavior During Therapy [facility] for tasks assessed/performed      Past Medical History  Diagnosis Date  . Hypertension   . Hyperlipemia   . Joint pain, knee   . Nocturia   . Lung nodule   . GERD (gastroesophageal reflux disease)   . Anxiety   . Depression   . GERD (gastroesophageal reflux disease)   . Gastric nodule   . OSA (obstructive sleep apnea)     Past Surgical History  Procedure Laterality Date  . Nose surgery    . Knee arthroscopy  1995  . Cardiac catheterization    . Total knee arthroplasty  10/11/2011    Procedure: TOTAL KNEE ARTHROPLASTY;  Surgeon: Rudean Haskell, MD;  Location: [facility];  Service: Orthopedics;  Laterality: Right;    There were no vitals filed for this visit.  Visit Diagnosis:  Midline low back pain without sciatica  Generalized weakness      Subjective Assessment - 12/19/14 1534    Subjective back is feeling a lot better; increasing activity at work.  Walking more at home. Had a little back pain yesterday afternoon.   How long can you walk comfortably? Reports back doesn't limit him from walking anymore   Diagnostic tests MRI ("may be 10 years or more") revealed bulging disc   Patient Stated Goals decrease pain   Currently in Pain? No/denies                         [facility] Adult PT Treatment/Exercise - 12/19/14 1535    Lumbar Exercises:  Stretches   Passive Hamstring Stretch 3 reps;30 seconds   Passive Hamstring Stretch Limitations bil with strap   Single Knee to Chest Stretch 3 reps;30 seconds   Single Knee to Chest Stretch Limitations bil   Lower Trunk Rotation 5 reps;10 seconds   Lumbar Exercises: Aerobic   Stationary Bike Level 3 x 8 min   Lumbar Exercises: Supine   Ab Set 10 reps;5 seconds   Clam 10 reps   Clam Limitations with mod cues for TrA contraction and hold   Bent Knee Raise 10 reps   Bent Knee Raise Limitations mod cues to maintain TrA contraction                     PT Long Term Goals - 12/09/14 1609    PT LONG TERM GOAL #1   Title independent with HEP (01/16/15)   Status On-going   PT LONG TERM GOAL #2   Title verbalize understanding of posture/body mechanics to reduce risk of reinjury (01/16/15)   Status On-going   PT LONG TERM GOAL #3   Title report ability to perform work tasks without increase in pain (01/16/15)   Status On-going  Plan - 12/19/14 1610    Clinical Impression Statement Continues to improve and report decreased pain.  Ready for increase in HEP for core and hip stability.   PT Next Visit Plan Continue core/hip stability; update HEP   Consulted and Agree with Plan of Care Patient        Problem List Patient Active Problem List   Diagnosis Date Noted  . Anemia 11/16/2014  . Preventative health care 11/16/2014  . Hyperglycemia 11/16/2014  . Sinusitis, acute 10/14/2014  . HTN (hypertension) 10/10/2014  . Allergic rhinitis 10/10/2014   Laureen Abrahams, PT, DPT 12/19/2014 4:11 PM  [facility] [address]  [place], [place], [address] Phone: [phone]   Fax:  [phone]

## 2014-12-23 ENCOUNTER — Encounter: Payer: Self-pay | Admitting: Family

## 2014-12-23 ENCOUNTER — Ambulatory Visit (INDEPENDENT_AMBULATORY_CARE_PROVIDER_SITE_OTHER): Payer: BLUE CROSS/BLUE SHIELD | Admitting: Family

## 2014-12-23 ENCOUNTER — Ambulatory Visit: Payer: BLUE CROSS/BLUE SHIELD | Attending: Family | Admitting: Rehabilitation

## 2014-12-23 VITALS — BP 154/90 | HR 71 | Temp 97.8°F | Resp 16 | Ht 67.0 in | Wt 240.4 lb

## 2014-12-23 VITALS — BP 150/100 | HR 79

## 2014-12-23 DIAGNOSIS — M545 Low back pain, unspecified: Secondary | ICD-10-CM

## 2014-12-23 DIAGNOSIS — R531 Weakness: Secondary | ICD-10-CM | POA: Insufficient documentation

## 2014-12-23 DIAGNOSIS — I10 Essential (primary) hypertension: Secondary | ICD-10-CM

## 2014-12-23 MED ORDER — AMLODIPINE BESY-BENAZEPRIL HCL 10-20 MG PO CAPS: 1.0000 | ORAL_CAPSULE | Freq: Every day | ORAL | Status: DC

## 2014-12-23 NOTE — Assessment & Plan Note (Addendum)
Uncontrolled. My manual recheck 150/94.  Recommended that pt take 5 mg of amlodipine when you get home tonight. Then stop amlodipine 5mg .  Stop lotrel 5-20 and start lotrel 10-20 in AM Continue hctz. Please follow up in 2 weeks.

## 2014-12-23 NOTE — Therapy (Signed)
[facility] [address] [facility] [address]  [place], [place], [address] Phone: [phone]   Fax:  [phone]  Physical Therapy Treatment  Patient Details  Name: Jeremy [name] MRN: [id number] Date of Birth: 28-Oct-1955 Referring Provider:  [name], NP  Encounter Date: 12/23/2014      PT End of Session - 12/23/14 1631    Visit Number 5   Number of Visits 12   Date for PT Re-Evaluation 01/16/15   PT Start Time 1620   PT Stop Time 6063   PT Time Calculation (min) 38 min      Past Medical History  Diagnosis Date  . Hypertension   . Hyperlipemia   . Joint pain, knee   . Nocturia   . Lung nodule   . GERD (gastroesophageal reflux disease)   . Anxiety   . Depression   . GERD (gastroesophageal reflux disease)   . Gastric nodule   . OSA (obstructive sleep apnea)     Past Surgical History  Procedure Laterality Date  . Nose surgery    . Knee arthroscopy  1995  . Cardiac catheterization    . Total knee arthroplasty  10/11/2011    Procedure: TOTAL KNEE ARTHROPLASTY;  Surgeon: Rudean Haskell, MD;  Location: [facility];  Service: Orthopedics;  Laterality: Right;    Filed Vitals:   12/23/14 1623  BP: 150/100  Pulse: 79    Visit Diagnosis:  Midline low back pain without sciatica  Generalized weakness      Subjective Assessment - 12/23/14 1629    Subjective Reports feeling sick to his stomach and having a headache all day. Just not feeling right. Would like his blood pressure checked. States that he went off of one of his blood pressure meds and a similar thing happened the last time. 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    Currently in Pain? No/denies                         [facility] Adult PT Treatment/Exercise - 12/23/14 1632    Lumbar Exercises: Stretches   Passive Hamstring Stretch 3 reps;30 seconds   Passive Hamstring Stretch Limitations bil with strap    Single Knee to Chest Stretch 3 reps;30 seconds   Single Knee to Chest Stretch Limitations bil   Lumbar Exercises: Seated   Other Seated Lumbar Exercises Alt UE/LE x10 with TrA   Other Seated Lumbar Exercises TrA contraction with red med ball overhead lifts x10, then diagonal lifts red med ball x10   Lumbar Exercises: Supine   Clam 15 reps   Bent Knee Raise 10 reps   Bridge 15 reps   Straight Leg Raise --  x12                     PT Long Term Goals - 12/09/14 1609    PT LONG TERM GOAL #1   Title independent with HEP (01/16/15)   Status On-going   PT LONG TERM GOAL #2   Title verbalize understanding of posture/body mechanics to reduce risk of reinjury (01/16/15)   Status On-going   PT LONG TERM GOAL #3   Title report ability to perform work tasks without increase in pain (01/16/15)   Status On-going               Plan - 12/23/14 1658    Clinical Impression Statement Less exercise/more  rest breaks due to high blood pressure. Did not increase HEP today due to lower intesity, will do at next visit.    PT Next Visit Plan Continue core/hip stability; update HEP   Consulted and Agree with Plan of Care Patient        Problem List Patient Active Problem List   Diagnosis Date Noted  . Anemia 11/16/2014  . Preventative health care 11/16/2014  . Hyperglycemia 11/16/2014  . Sinusitis, acute 10/14/2014  . HTN (hypertension) 10/10/2014  . Allergic rhinitis 10/10/2014    Barbette Hair, PTA 12/23/2014, 5:00 PM  [facility] [address]  [place], [place], [address] Phone: [phone]   Fax:  [phone]

## 2014-12-23 NOTE — Progress Notes (Signed)
Subjective:    [name], male    DOB: 1956-04-16, 59 y.o.   MRN: [id number]  HPI  Jeremy [name] is a 59 yr old male who presents today with report of headache.  Was taking lotrel 5-20 but ran out of this . Reports that he has headache today, dull, not like his previous migraines.   Review of Systems     Past Medical History  Diagnosis Date  . Hypertension   . Hyperlipemia   . Joint pain, knee   . Nocturia   . Lung nodule   . GERD (gastroesophageal reflux disease)   . Anxiety   . Depression   . GERD (gastroesophageal reflux disease)   . Gastric nodule   . OSA (obstructive sleep apnea)     History   Social History  . Marital Status: Married    Spouse Name: N/A  . Number of Children: N/A  . Years of Education: N/A   Occupational History  . Not on file.   Social History Main Topics  . Smoking status: Former Research scientist (life sciences)  . Smokeless tobacco: Not on file  . Alcohol Use: No  . Drug Use: No  . Sexual Activity: Not on file   Other Topics Concern  . 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 Nose surgery    . Knee arthroscopy  1995  . Cardiac catheterization    . Total knee arthroplasty  10/11/2011    Procedure: TOTAL KNEE ARTHROPLASTY;  Surgeon: Rudean Haskell, MD;  Location: [facility];  Service: Orthopedics;  Laterality: Right;    Family History  Problem Relation Age of Onset  . Anesthesia problems Neg Hx   . Hypotension Neg Hx   . Malignant hyperthermia Neg Hx   . Pseudochol deficiency Neg Hx   . Stroke Mother   . Hypertension Mother     No Known Allergies  Current Outpatient Prescriptions on File Prior to Visit  Medication Sig Dispense Refill  . amLODipine (NORVASC) 5 MG tablet Take 1 tablet (5 mg total) by mouth daily. 30  tablet 5  . b complex vitamins tablet Take 1 tablet by mouth daily.    . fexofenadine (ALLEGRA) 180 MG tablet Take 180 mg by mouth daily.    . fluticasone (FLONASE) 50 MCG/ACT nasal spray Place 2 sprays into both nostrils daily. 16 g 0  . hydrochlorothiazide (MICROZIDE) 12.5 MG capsule Take 12.5 mg by mouth daily.    . Multiple Vitamins-Minerals (MULTIVITAMINS THER. W/MINERALS) TABS Take 1 tablet by mouth daily.    Marland Kitchen omeprazole (PRILOSEC) 40 MG capsule Take 40 mg by mouth daily.    Marland Kitchen amLODipine-benazepril (LOTREL) 5-20 MG per capsule Take 1 capsule by mouth daily. (Patient not taking: Reported on 12/23/2014) 30 capsule 0   No current facility-administered medications on file prior to visit.    BP 130/82 mmHg  Pulse 71  Temp(Src) 97.8 F (36.6 C) (Oral)  Resp 16  Ht 5\' 7"  (1.702 m)  Wt 240 lb 6.4 oz (109.045 kg)  BMI 37.64 kg/m2  SpO2 95%    Objective:   Physical Exam  Constitutional: He is oriented to person, place, and time. He appears well-developed and well-nourished. No distress.  HENT:  Head: Normocephalic and atraumatic.  Right Ear: Tympanic membrane and ear canal normal.  Left Ear: Tympanic membrane and ear canal normal.  Mouth/Throat: No oropharyngeal exudate, posterior oropharyngeal edema or posterior oropharyngeal erythema.  Cardiovascular: Normal rate and regular rhythm.   No murmur heard. Pulmonary/Chest: Effort normal and breath sounds normal. No respiratory distress. He has no wheezes. He has no rales.  Musculoskeletal: He exhibits no edema.  Neurological: He is alert and oriented to person, place, and time.  Skin: Skin is warm and dry.  Psychiatric: He has a normal mood and affect. His behavior is normal. Thought content normal.          Assessment & Plan:

## 2014-12-23 NOTE — Progress Notes (Signed)
Pre visit review using our clinic review tool, if applicable. No additional management support is needed unless otherwise documented below in the visit note. 

## 2014-12-23 NOTE — Patient Instructions (Addendum)
Take 5 mg of amlodipine when you get home tonight. Then stop amlodipine 5mg .  Stop lotrel 5-20 and start lotrel 10-20 in AM Continue hctz. Please follow up in 2 weeks.

## 2014-12-24 ENCOUNTER — Telehealth: Payer: Self-pay | Admitting: *Deleted

## 2014-12-24 NOTE — Telephone Encounter (Signed)
Received call from pt stating pharmacy did not receive lotrel Rx last night. Called and gave verbal auth per 12/23/14 documentation; 10-20mg , 1 capsule daily, #30 x 5 refills to Diane at [facility].

## 2014-12-25 ENCOUNTER — Ambulatory Visit: Payer: BLUE CROSS/BLUE SHIELD | Admitting: Rehabilitation

## 2014-12-25 VITALS — BP 132/94

## 2014-12-25 DIAGNOSIS — M545 Low back pain, unspecified: Secondary | ICD-10-CM

## 2014-12-25 DIAGNOSIS — R531 Weakness: Secondary | ICD-10-CM

## 2014-12-25 NOTE — Therapy (Signed)
[facility] [address]  [place], [place], [address] Phone: [phone]   Fax:  [phone]  Physical Therapy Treatment  Patient Details  Name: Jeremy [name] MRN: [id number] Date of Birth: 03-18-1956 Referring Provider:  [name], NP  Encounter Date: 12/25/2014      PT End of Session - 12/25/14 1614    Visit Number 6   Number of Visits 12   Date for PT Re-Evaluation 01/16/15   PT Start Time 0623   PT Stop Time 1645   PT Time Calculation (min) 41 min      Past Medical History  Diagnosis Date  . Hypertension   . Hyperlipemia   . Joint pain, knee   . Nocturia   . Lung nodule   . GERD (gastroesophageal reflux disease)   . Anxiety   . Depression   . GERD (gastroesophageal reflux disease)   . Gastric nodule   . OSA (obstructive sleep apnea)     Past Surgical History  Procedure Laterality Date  . Nose surgery    . Knee arthroscopy  1995  . Cardiac catheterization    . Total knee arthroplasty  10/11/2011    Procedure: TOTAL KNEE ARTHROPLASTY;  Surgeon: Rudean Haskell, MD;  Location: [facility];  Service: Orthopedics;  Laterality: Right;    Filed Vitals:   12/25/14 1607  BP: 132/94    Visit Diagnosis:  Midline low back pain without sciatica  Generalized weakness      Subjective Assessment - 12/25/14 1607    Subjective States he felt better today, doctor changed his medication and he started that today. Reports no limitations with work related activities or daily activities.    Currently in Pain? No/denies                         [facility] Adult PT Treatment/Exercise - 12/25/14 1611    Lumbar Exercises: Stretches   Passive Hamstring Stretch 3 reps;30 seconds   Passive Hamstring Stretch Limitations bil with strap   Single Knee to Chest Stretch 3 reps;30 seconds   Single Knee to Chest Stretch Limitations bil   Lumbar Exercises: Aerobic   Stationary Bike Level 2x5   Lumbar  Exercises: Standing   Functional Squats 10 reps  x2   Lumbar Exercises: Seated   Other Seated Lumbar Exercises Alt UE/LE x15 with TrA   Other Seated Lumbar Exercises TrA contraction with red med ball overhead lifts x15, then diagonal lifts red med ball x15; Rotational Stab with blue TB 5"x10 each way   Lumbar Exercises: Supine   Bridge 15 reps   Straight Leg Raise 15 reps                     PT Long Term Goals - 12/25/14 1610    PT LONG TERM GOAL #1   Title independent with HEP (01/16/15)   Status On-going   PT LONG TERM GOAL #2   Title verbalize understanding of posture/body mechanics to reduce risk of reinjury (01/16/15)   Status On-going   PT LONG TERM GOAL #3   Title report ability to perform work tasks without increase in pain (01/16/15)   Status Achieved               Plan - 12/25/14 1646    Clinical Impression Statement Returned to exercise today without c/o pain. Improved blood pressure today, most likely due to change in  medication. Pt feels that he might be ready for d/c next week.    PT Next Visit Plan Continue core/hip stability; update HEP   Consulted and Agree with Plan of Care Patient        Problem List Patient Active Problem List   Diagnosis Date Noted  . Anemia 11/16/2014  . Preventative health care 11/16/2014  . Hyperglycemia 11/16/2014  . HTN (hypertension) 10/10/2014  . Allergic rhinitis 10/10/2014    Barbette Hair, PTA 12/25/2014, 4:47 PM  [facility] [address]  [place], [place], [address] Phone: [phone]   Fax:  [phone]

## 2014-12-30 ENCOUNTER — Ambulatory Visit: Payer: BLUE CROSS/BLUE SHIELD | Admitting: Rehabilitation

## 2014-12-30 VITALS — BP 140/92

## 2014-12-30 DIAGNOSIS — R531 Weakness: Secondary | ICD-10-CM

## 2014-12-30 DIAGNOSIS — M545 Low back pain, unspecified: Secondary | ICD-10-CM

## 2014-12-30 NOTE — Therapy (Signed)
[facility] [address]  [place], [place], [address] Phone: [phone]   Fax:  [phone]  Physical Therapy Treatment  Patient Details  Name: Jeremy [name] MRN: [id number] Date of Birth: 1955-11-16 Referring Provider:  [name], NP  Encounter Date: 12/30/2014      PT End of Session - 12/30/14 1656    Visit Number 7   Number of Visits 12   Date for PT Re-Evaluation 01/16/15   PT Start Time 6962   PT Stop Time 1655   PT Time Calculation (min) 39 min      Past Medical History  Diagnosis Date  . Hypertension   . Hyperlipemia   . Joint pain, knee   . Nocturia   . Lung nodule   . GERD (gastroesophageal reflux disease)   . Anxiety   . Depression   . GERD (gastroesophageal reflux disease)   . Gastric nodule   . OSA (obstructive sleep apnea)     Past Surgical History  Procedure Laterality Date  . Nose surgery    . Knee arthroscopy  1995  . Cardiac catheterization    . Total knee arthroplasty  10/11/2011    Procedure: TOTAL KNEE ARTHROPLASTY;  Surgeon: Rudean Haskell, MD;  Location: [facility];  Service: Orthopedics;  Laterality: Right;    Filed Vitals:   12/30/14 1621  BP: 140/92    Visit Diagnosis:  Midline low back pain without sciatica  Generalized weakness      Subjective Assessment - 12/30/14 1621    Subjective Noted a little pain while at work but states that went away. Reports a little back pain over the weekend but thinks that can be from hitting some golf balls.    Currently in Pain? No/denies                         [facility] Adult PT Treatment/Exercise - 12/30/14 1623    Lumbar Exercises: Aerobic   Stationary Bike Level 2x6   Lumbar Exercises: Standing   Functional Squats 10 reps  2 sets   Lumbar Exercises: Seated   Other Seated Lumbar Exercises TrA contraction with red med ball overhead lifts x15, then diagonal lifts red med ball x15; Rotational Stab with  blue TB 5"x10 each way   Lumbar Exercises: Supine   Clam 15 reps  Blue TB around knees, alternating   Dead Bug 10 reps  march+UE lift   Bridge 10 reps  then 10x with feet on peanut ball   Straight Leg Raise 15 reps  bil   Lumbar Exercises: Sidelying   Hip Abduction 15 reps  bil   Lumbar Exercises: Prone   Single Arm Raise 15 reps  bil                PT Education - 12/30/14 1655    Education provided Yes   Education Details HEP   Person(s) Educated Patient   Methods Explanation;Handout   Comprehension Verbalized understanding;Returned demonstration             PT Long Term Goals - 12/25/14 1610    PT LONG TERM GOAL #1   Title independent with HEP (01/16/15)   Status On-going   PT LONG TERM GOAL #2   Title verbalize understanding of posture/body mechanics to reduce risk of reinjury (01/16/15)   Status On-going   PT LONG TERM GOAL #3   Title report ability to perform work tasks  without increase in pain (01/16/15)   Status Achieved               Plan - 12/30/14 1656    Clinical Impression Statement Good tolerance to all exercises and able to increase HEP today. No pain with exercises, pt feels that he is almost ready to be done.    PT Next Visit Plan Continue core/hip stability; update HEP   Consulted and Agree with Plan of Care Patient        Problem List Patient Active Problem List   Diagnosis Date Noted  . Anemia 11/16/2014  . Preventative health care 11/16/2014  . Hyperglycemia 11/16/2014  . HTN (hypertension) 10/10/2014  . Allergic rhinitis 10/10/2014    Barbette Hair, PTA 12/30/2014, 5:03 PM  [facility] [address]  [place], [place], [address] Phone: [phone]   Fax:  [phone]

## 2015-01-02 ENCOUNTER — Ambulatory Visit: Payer: BLUE CROSS/BLUE SHIELD | Admitting: Physical Therapy

## 2015-01-02 DIAGNOSIS — R531 Weakness: Secondary | ICD-10-CM

## 2015-01-02 DIAGNOSIS — M545 Low back pain, unspecified: Secondary | ICD-10-CM

## 2015-01-02 NOTE — Therapy (Signed)
[facility] [address]  [place], [place], [address] Phone: [phone]   Fax:  [phone]  Physical Therapy Treatment  Patient Details  Name: RANKIN COOLMAN MRN: [id number] Date of Birth: 08/25/1955 Referring Provider:  [name], NP  Encounter Date: 01/02/2015      PT End of Session - 01/02/15 1644    Visit Number 8   Date for PT Re-Evaluation 01/16/15   PT Start Time 1610   PT Stop Time 1644   PT Time Calculation (min) 34 min   Activity Tolerance Patient tolerated treatment well   Behavior During Therapy [facility] for tasks assessed/performed      Past Medical History  Diagnosis Date  . Hypertension   . Hyperlipemia   . Joint pain, knee   . Nocturia   . Lung nodule   . GERD (gastroesophageal reflux disease)   . Anxiety   . Depression   . GERD (gastroesophageal reflux disease)   . Gastric nodule   . OSA (obstructive sleep apnea)     Past Surgical History  Procedure Laterality Date  . Nose surgery    . Knee arthroscopy  1995  . Cardiac catheterization    . Total knee arthroplasty  10/11/2011    Procedure: TOTAL KNEE ARTHROPLASTY;  Surgeon: Rudean Haskell, MD;  Location: [facility];  Service: Orthopedics;  Laterality: Right;    There were no vitals filed for this visit.  Visit Diagnosis:  Midline low back pain without sciatica  Generalized weakness      Subjective Assessment - 01/02/15 1611    Subjective No pain overall. Reports "just a little bit today at work" but reports went away   Patient Stated Goals decrease pain   Currently in Pain? No/denies            [facility] PT Assessment - 01/02/15 1646    Observation/Other Assessments   Focus on Therapeutic Outcomes (FOTO)  79 (21% limited)                     [facility] Adult PT Treatment/Exercise - 01/02/15 1611    Lumbar Exercises: Stretches   Passive Hamstring Stretch 2 reps;30 seconds   Passive Hamstring Stretch  Limitations bil with strap   Single Knee to Chest Stretch 2 reps;30 seconds   Single Knee to Chest Stretch Limitations bil   Lower Trunk Rotation Limitations 10 x 5 sec hold bil   Lumbar Exercises: Aerobic   Stationary Bike NuStep L 6 x 6 min   Lumbar Exercises: Supine   Clam 10 reps   Clam Limitations with ab set   Bent Knee Raise 10 reps   Bent Knee Raise Limitations with ab set   Bridge 10 reps   Knee/Hip Exercises: Standing   Wall Squat 10 reps                PT Education - 01/02/15 1644    Education provided Yes   Education Details posture/body International aid/development worker) Educated Patient   Methods Explanation;Demonstration;Handout   Comprehension Verbalized understanding;Returned demonstration             PT Long Term Goals - 01/02/15 1645    PT LONG TERM GOAL #1   Title independent with HEP (01/16/15)   Status Achieved   PT LONG TERM GOAL #2   Title verbalize understanding of posture/body mechanics to reduce risk of reinjury (01/16/15)   Status Achieved   PT LONG  TERM GOAL #3   Title report ability to perform work tasks without increase in pain (01/16/15)   Status Achieved               Plan - 01/02/15 1644    Clinical Impression Statement pt has met all goals and reports no more pain.  Pt did state that he will on rare occasions feel some pain in back but resolves quickly without incident.  Pt ready for d/c at this time.   PT Next Visit Plan d/c PT today   Consulted and Agree with Plan of Care Patient        Problem List Patient Active Problem List   Diagnosis Date Noted  . Anemia 11/16/2014  . Preventative health care 11/16/2014  . Hyperglycemia 11/16/2014  . HTN (hypertension) 10/10/2014  . Allergic rhinitis 10/10/2014   Laureen Abrahams, PT, DPT 01/02/2015 4:49 PM  [facility] [address]  [place], [place], [address] Phone: [phone]   Fax:   [phone]    PHYSICAL THERAPY DISCHARGE SUMMARY  Visits from Start of Care: 8  Current functional level related to goals / functional outcomes: See above all goals met   Remaining deficits: n/a   Education / Equipment: HEP, posture/body mechanics  Plan: Patient agrees to discharge.  Patient goals were met. Patient is being discharged due to meeting the stated rehab goals.  ?????    Laureen Abrahams, PT, DPT 01/02/2015 4:50 PM  [facility] [place], [place] [address]  [phone] (office) [phone] (fax)

## 2015-01-02 NOTE — Patient Instructions (Signed)

## 2015-01-06 ENCOUNTER — Ambulatory Visit (INDEPENDENT_AMBULATORY_CARE_PROVIDER_SITE_OTHER): Payer: BLUE CROSS/BLUE SHIELD | Admitting: Family

## 2015-01-06 ENCOUNTER — Encounter: Payer: Self-pay | Admitting: Family

## 2015-01-06 VITALS — BP 128/82 | HR 68 | Temp 97.8°F | Resp 16 | Ht 67.0 in | Wt 239.0 lb

## 2015-01-06 DIAGNOSIS — I10 Essential (primary) hypertension: Secondary | ICD-10-CM | POA: Diagnosis not present

## 2015-01-06 NOTE — Progress Notes (Signed)
Subjective:    [name], male    DOB: 1956/01/24, 59 y.o.   MRN: [id number]  HPI   Jeremy [name] is a 59 yr old male who presents today for follow up of his blood pressure.  Patient is currently maintained on the following medications for blood pressure: lotrel 10-20mg , microzide 12.5.   Patient reports good compliance with blood pressure medications. Patient denies chest pain, shortness of breath or swelling. Last 3 blood pressure readings in our office are as follows:   BP Readings from Last 3 Encounters:  01/06/15 128/82  12/30/14 140/92  12/25/14 132/94    Wt Readings from Last 3 Encounters:  01/06/15 239 lb (108.41 kg)  12/23/14 240 lb 6.4 oz (109.045 kg)  11/11/14 247 lb 9.6 oz (112.311 kg)    Review of Systems See HPI  Past Medical History  Diagnosis Date  . Hypertension   . Hyperlipemia   . Joint pain, knee   . Nocturia   . Lung nodule   . GERD (gastroesophageal reflux disease)   . Anxiety   . Depression   . GERD (gastroesophageal reflux disease)   . Gastric nodule   . OSA (obstructive sleep apnea)     History   Social History  . Marital Status: Married    Spouse Name: N/A  . Number of Children: N/A  . Years of Education: N/A   Occupational History  . Not on file.   Social History Main Topics  . Smoking status: Former Research scientist (life sciences)  . Smokeless tobacco: Not on file  . Alcohol Use: No  . Drug Use: No  . Sexual Activity: Not on file   Other Topics Concern  . 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 Nose surgery    . Knee arthroscopy  1995  . Cardiac catheterization    . 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 Anesthesia problems Neg Hx   . Hypotension Neg Hx   . Malignant hyperthermia Neg Hx   . Pseudochol deficiency Neg Hx   . Stroke Mother   . Hypertension Mother     No Known Allergies  Current Outpatient Prescriptions on File Prior to Visit  Medication Sig Dispense Refill  . amLODipine-benazepril (LOTREL) 10-20 MG per capsule Take 1 capsule by mouth daily. 30 capsule 5  . b complex vitamins tablet Take 1 tablet by mouth daily.    . fexofenadine (ALLEGRA) 180 MG tablet Take 180 mg by mouth daily.    . fluticasone (FLONASE) 50 MCG/ACT nasal spray Place 2 sprays into both nostrils daily. 16 g 0  . hydrochlorothiazide (MICROZIDE) 12.5 MG capsule Take 12.5 mg by mouth daily.    . Multiple Vitamins-Minerals (MULTIVITAMINS THER. W/MINERALS) TABS Take 1 tablet by mouth daily.    Marland Kitchen omeprazole (PRILOSEC) 40 MG capsule Take 40 mg by mouth daily.     No current facility-administered medications on file prior to visit.    BP 128/82 mmHg  Pulse 68  Temp(Src) 97.8 F (36.6 C) (Oral)  Resp  16  Ht 5\' 7"  (1.702 m)  Wt 239 lb (108.41 kg)  BMI 37.42 kg/m2  SpO2 98%       Objective:   Physical Exam  Constitutional: He is oriented to person, place, and time. He appears well-developed and well-nourished. No distress.  HENT:  Head: Normocephalic and atraumatic.  Cardiovascular: Normal rate and regular rhythm.   No murmur heard. Pulmonary/Chest: Effort normal and breath sounds normal. No respiratory distress. He has no wheezes. He has no rales.  Neurological: He is alert and oriented to person, place, and time.  Skin: Skin is warm and dry.  Psychiatric: He has a normal mood and affect. His behavior is normal. Thought content normal.          Assessment & Plan:

## 2015-01-06 NOTE — Progress Notes (Signed)
Pre visit review using our clinic review tool, if applicable. No additional management support is needed unless otherwise documented below in the visit note. 

## 2015-01-06 NOTE — Patient Instructions (Signed)
Please complete lab work tonight or later this week. Follow up in June as scheduled.

## 2015-01-06 NOTE — Assessment & Plan Note (Signed)
BP stable on current meds. Continue same, obtain bmet.  

## 2015-01-08 ENCOUNTER — Telehealth: Payer: Self-pay | Admitting: Family

## 2015-01-08 ENCOUNTER — Other Ambulatory Visit: Payer: BLUE CROSS/BLUE SHIELD

## 2015-01-08 DIAGNOSIS — E876 Hypokalemia: Secondary | ICD-10-CM

## 2015-01-08 LAB — BASIC METABOLIC PANEL
BUN: 16 mg/dL (ref 6–23)
CALCIUM: 9 mg/dL (ref 8.4–10.5)
CO2: 27 meq/L (ref 19–32)
Chloride: 106 mEq/L (ref 96–112)
Creatinine, Ser: 0.92 mg/dL (ref 0.40–1.50)
GFR: 89.16 mL/min (ref >60.00)
Glucose, Bld: 94 mg/dL (ref 70–99)
Potassium: 3.3 mEq/L — ABNORMAL LOW (ref 3.5–5.1)
SODIUM: 140 meq/L (ref 135–145)

## 2015-01-08 MED ORDER — POTASSIUM CHLORIDE CRYS ER 20 MEQ PO TBCR: 20.0000 meq | EXTENDED_RELEASE_TABLET | Freq: Every day | ORAL | Status: DC

## 2015-01-08 NOTE — Telephone Encounter (Signed)
Potassium is low. Add kdur once daily, repeat bmet in 1 week, dx hypokalemia.

## 2015-01-08 NOTE — Telephone Encounter (Signed)
Left detailed message on home # re: below instructions and to call an schedule lab appt in 1 week. Future order entered.

## 2015-01-08 NOTE — Telephone Encounter (Signed)
Caller name:Gloyd Shawley Relationship to patient:self Can be reached:[phone] Pharmacy: [facility] [address] Reason for call:Wants to know if A1C needs to checked prior to next visit in June. Please advise

## 2015-01-09 ENCOUNTER — Other Ambulatory Visit: Payer: BLUE CROSS/BLUE SHIELD

## 2015-01-09 NOTE — Telephone Encounter (Signed)
We can do at his visit.

## 2015-01-10 NOTE — Telephone Encounter (Signed)
Left detailed message on home # and to call if further questions.

## 2015-01-16 ENCOUNTER — Other Ambulatory Visit (INDEPENDENT_AMBULATORY_CARE_PROVIDER_SITE_OTHER): Payer: BLUE CROSS/BLUE SHIELD

## 2015-01-16 DIAGNOSIS — E876 Hypokalemia: Secondary | ICD-10-CM | POA: Diagnosis not present

## 2015-01-17 ENCOUNTER — Encounter: Payer: Self-pay | Admitting: Family

## 2015-01-17 LAB — BASIC METABOLIC PANEL
BUN: 17 mg/dL (ref 6–23)
CHLORIDE: 109 meq/L (ref 96–112)
CO2: 27 meq/L (ref 19–32)
CREATININE: 0.98 mg/dL (ref 0.40–1.50)
Calcium: 9 mg/dL (ref 8.4–10.5)
GFR: 82.89 mL/min (ref >60.00)
Glucose, Bld: 96 mg/dL (ref 70–99)
POTASSIUM: 4.2 meq/L (ref 3.5–5.1)
Sodium: 141 mEq/L (ref 135–145)

## 2015-02-10 ENCOUNTER — Ambulatory Visit (INDEPENDENT_AMBULATORY_CARE_PROVIDER_SITE_OTHER): Payer: BLUE CROSS/BLUE SHIELD | Admitting: Family

## 2015-02-10 ENCOUNTER — Encounter: Payer: Self-pay | Admitting: Family

## 2015-02-10 ENCOUNTER — Other Ambulatory Visit: Payer: BLUE CROSS/BLUE SHIELD

## 2015-02-10 ENCOUNTER — Telehealth: Payer: Self-pay | Admitting: Family

## 2015-02-10 VITALS — BP 120/70 | HR 64 | Temp 97.8°F | Resp 16 | Ht 67.0 in | Wt 230.2 lb

## 2015-02-10 DIAGNOSIS — E119 Type 2 diabetes mellitus without complications: Secondary | ICD-10-CM

## 2015-02-10 DIAGNOSIS — F419 Anxiety disorder, unspecified: Secondary | ICD-10-CM

## 2015-02-10 DIAGNOSIS — F32A Depression, unspecified: Secondary | ICD-10-CM | POA: Insufficient documentation

## 2015-02-10 DIAGNOSIS — I10 Essential (primary) hypertension: Secondary | ICD-10-CM | POA: Diagnosis not present

## 2015-02-10 DIAGNOSIS — G4733 Obstructive sleep apnea (adult) (pediatric): Secondary | ICD-10-CM | POA: Diagnosis not present

## 2015-02-10 DIAGNOSIS — R739 Hyperglycemia, unspecified: Secondary | ICD-10-CM

## 2015-02-10 DIAGNOSIS — F418 Other specified anxiety disorders: Secondary | ICD-10-CM

## 2015-02-10 DIAGNOSIS — K219 Gastro-esophageal reflux disease without esophagitis: Secondary | ICD-10-CM

## 2015-02-10 DIAGNOSIS — F329 Major depressive disorder, single episode, unspecified: Secondary | ICD-10-CM

## 2015-02-10 MED ORDER — HYDROCHLOROTHIAZIDE 12.5 MG PO CAPS: 12.5000 mg | ORAL_CAPSULE | Freq: Every day | ORAL | Status: DC

## 2015-02-10 NOTE — Assessment & Plan Note (Signed)
Pt reports stable off of meds.

## 2015-02-10 NOTE — Telephone Encounter (Signed)
Patient wants to have his a1c checked prior to his 515 appointment today.  He gets off work at 2. If you will put in the order Anderson Malta will call him and make the appointment

## 2015-02-10 NOTE — Progress Notes (Signed)
Subjective:    [name], male    DOB: [birth year], 59 y.o.   MRN: [id number]  HPI  Jeremy [name] is a 59 yr old male who presents today for follow up.   1) HTN-  Patient is currently maintained on the following medications for blood pressure: hctz + lotrel Patient reports good compliance with blood pressure medications. Patient denies chest pain, shortness of breath or swelling. Last 3 blood pressure readings in our office are as follows: BP Readings from Last 3 Encounters:  02/10/15 120/70  01/06/15 128/82  12/30/14 140/92   2) Hyperglycemia-  Denies polyuria or polydipsia.  Lab Results  Component Value Date   HGBA1C 6.1 11/19/2014   3) GERD- reports reflux well controlled on omeprazole.   4) anxiety/depression- reports stable no meds.  5) OSA- Reports that he had a sleep study done with [facility].  Reports last sleep study was 2-3 years ago. He used CPAP for about 6 months ago.  He declines CPAP.   Review of Systems    see HPI  Past Medical History  Diagnosis Date  . Hypertension   . Hyperlipemia   . Joint pain, knee   . Nocturia   . Lung nodule   . GERD (gastroesophageal reflux disease)   . Anxiety   . Depression   . GERD (gastroesophageal reflux disease)   . Gastric nodule   . OSA (obstructive sleep apnea)     History   Social History  . Marital Status: Married    Spouse Name: N/A  . Number of Children: N/A  . Years of Education: N/A   Occupational History  . Not on file.   Social History Main Topics  . Smoking status: Former Research scientist (life sciences)  . Smokeless tobacco: Not on file  . Alcohol Use: No  . Drug Use: No  . Sexual Activity: Not on file   Other Topics Concern  . 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  Nose surgery    . Knee arthroscopy  1995  . Cardiac catheterization    . 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 Anesthesia problems Neg Hx   . Hypotension Neg Hx   . Malignant hyperthermia Neg Hx   . Pseudochol deficiency Neg Hx   . Stroke Mother   . Hypertension Mother     No Known Allergies  Current Outpatient Prescriptions on File Prior to Visit  Medication Sig Dispense Refill  . amLODipine-benazepril (LOTREL) 10-20 MG per capsule Take 1 capsule by mouth daily. 30 capsule 5  . b complex vitamins tablet Take 1 tablet by mouth daily.    . fexofenadine (ALLEGRA) 180 MG tablet Take 180 mg by mouth daily.    . fluticasone (FLONASE) 50 MCG/ACT nasal spray Place 2 sprays into both nostrils daily. 16 g 0  . hydrochlorothiazide (MICROZIDE) 12.5 MG capsule Take 12.5 mg by mouth daily.    . Multiple Vitamins-Minerals (MULTIVITAMINS THER. W/MINERALS) TABS Take 1 tablet by mouth daily.    Marland Kitchen omeprazole (PRILOSEC) 40 MG  capsule Take 40 mg by mouth daily.    . potassium chloride SA (K-DUR,KLOR-CON) 20 MEQ tablet Take 1 tablet (20 mEq total) by mouth daily. 30 tablet 3   No current facility-administered medications on file prior to visit.    BP 120/70 mmHg  Pulse 64  Temp(Src) 97.8 F (36.6 C) (Oral)  Resp 16  Ht 5\' 7"  (1.702 m)  Wt 230 lb 3.2 oz (104.418 kg)  BMI 36.05 kg/m2  SpO2 95%    Objective:   Physical Exam  Constitutional: He is oriented to person, place, and time. He appears well-developed and well-nourished. No distress.  HENT:  Head: Normocephalic and atraumatic.  Cardiovascular: Normal rate and regular rhythm.   No murmur heard. Pulmonary/Chest: Effort normal and breath sounds normal. No respiratory distress. He has no wheezes. He has no rales.  Musculoskeletal: He exhibits no edema.    Neurological: He is alert and oriented to person, place, and time.  Skin: Skin is warm and dry.  Psychiatric: He has a normal mood and affect. His behavior is normal. Thought content normal.          Assessment & Plan:

## 2015-02-10 NOTE — Telephone Encounter (Signed)
FYI--last a1c was 11/19/14.  Please advise below request?

## 2015-02-10 NOTE — Assessment & Plan Note (Signed)
Stable on PPI. Continue same.  

## 2015-02-10 NOTE — Telephone Encounter (Signed)
Placed order

## 2015-02-10 NOTE — Assessment & Plan Note (Signed)
BP stable on current meds. Continue same.  

## 2015-02-10 NOTE — Progress Notes (Signed)
Pre visit review using our clinic review tool, if applicable. No additional management support is needed unless otherwise documented below in the visit note. 

## 2015-02-10 NOTE — Patient Instructions (Signed)
Please follow up in 4 months

## 2015-02-10 NOTE — Assessment & Plan Note (Signed)
Follow up A1C pending.

## 2015-02-10 NOTE — Assessment & Plan Note (Addendum)
Discussed risks of untreated OSA. He declines treatment with CPAP at this time and wishes to focus on weight loss.

## 2015-02-11 ENCOUNTER — Encounter: Payer: Self-pay | Admitting: Family

## 2015-02-11 LAB — HEMOGLOBIN A1C: Hgb A1c MFr Bld: 5.9 % (ref 4.6–6.5)

## 2015-02-17 ENCOUNTER — Ambulatory Visit: Payer: BLUE CROSS/BLUE SHIELD | Admitting: Family

## 2015-05-13 ENCOUNTER — Telehealth: Payer: Self-pay | Admitting: Family

## 2015-05-13 MED ORDER — POTASSIUM CHLORIDE CRYS ER 20 MEQ PO TBCR: 20.0000 meq | EXTENDED_RELEASE_TABLET | Freq: Every day | ORAL | Status: DC

## 2015-05-13 NOTE — Telephone Encounter (Signed)
Caller name:Aeric Relationship to patient:self Can be reached:[phone] Pharmacy:[facility] [address]  Reason for call:potassium

## 2015-05-13 NOTE — Telephone Encounter (Signed)
Potassium refill sent to pharmacy. Left message on pt's voicemail re: completion.

## 2015-05-26 ENCOUNTER — Ambulatory Visit (INDEPENDENT_AMBULATORY_CARE_PROVIDER_SITE_OTHER): Payer: BLUE CROSS/BLUE SHIELD | Admitting: Family

## 2015-05-26 ENCOUNTER — Encounter: Payer: Self-pay | Admitting: Family

## 2015-05-26 VITALS — BP 128/74 | HR 66 | Temp 98.3°F | Resp 18 | Ht 67.0 in | Wt 223.2 lb

## 2015-05-26 DIAGNOSIS — J029 Acute pharyngitis, unspecified: Secondary | ICD-10-CM

## 2015-05-26 LAB — POCT RAPID STREP A (OFFICE): RAPID STREP A SCREEN: NEGATIVE

## 2015-05-26 NOTE — Progress Notes (Signed)
Pre visit review using our clinic review tool, if applicable. No additional management support is needed unless otherwise documented below in the visit note. 

## 2015-05-26 NOTE — Progress Notes (Signed)
Subjective:    [name], male    DOB: Aug 29, 1955, 59 y.o.   MRN: [id number]  HPI  Jeremy [name] is a 59 yr old male who presents today with chief complaint of hoarseness since yesterday. Reports + sore throat.  Has already had his flu shot 2 weeks ago.  Reports + facial pressure ear pain and sore throat. He is not taking any OTC meds.      Review of Systems See HPI  Past Medical History  Diagnosis Date  . Hypertension   . Hyperlipemia   . Joint pain, knee   . Nocturia   . Lung nodule   . GERD (gastroesophageal reflux disease)   . Anxiety   . Depression   . GERD (gastroesophageal reflux disease)   . Gastric nodule   . OSA (obstructive sleep apnea)     Social History   Social History  . Marital Status: Married    Spouse Name: N/A  . Number of Children: N/A  . Years of Education: N/A   Occupational History  . Not on file.   Social History Main Topics  . Smoking status: Former Research scientist (life sciences)  . Smokeless tobacco: Not on file  . Alcohol Use: No  . Drug Use: No  . Sexual Activity: Not on file   Other Topics Concern  . 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 Nose surgery    . Knee arthroscopy  1995  . Cardiac catheterization    . 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 Anesthesia problems Neg Hx   . Hypotension Neg Hx   . Malignant hyperthermia Neg Hx   . Pseudochol deficiency Neg Hx   . Stroke Mother   . Hypertension Mother     No Known Allergies  Current Outpatient Prescriptions on File Prior to Visit  Medication Sig Dispense Refill  .  amLODipine-benazepril (LOTREL) 10-20 MG per capsule Take 1 capsule by mouth daily. 30 capsule 5  . b complex vitamins tablet Take 1 tablet by mouth daily.    . fexofenadine (ALLEGRA) 180 MG tablet Take 180 mg by mouth daily.    . fluticasone (FLONASE) 50 MCG/ACT nasal spray Place 2 sprays into both nostrils daily. 16 g 0  . hydrochlorothiazide (MICROZIDE) 12.5 MG capsule Take 1 capsule (12.5 mg total) by mouth daily. 30 capsule 5  . Multiple Vitamins-Minerals (MULTIVITAMINS THER. W/MINERALS) TABS Take 1 tablet by mouth daily.    Marland Kitchen omeprazole (PRILOSEC) 40 MG capsule Take 40 mg by mouth daily.    . potassium chloride SA (K-DUR,KLOR-CON) 20 MEQ tablet Take 1 tablet (20 mEq total) by mouth daily. 30 tablet 5   No current facility-administered medications on file prior to visit.    BP 128/74 mmHg  Pulse 66  Temp(Src) 98.3 F (36.8 C) (Oral)  Resp 18  Ht 5\' 7"  (1.702 m)  Wt 223 lb 3.2 oz (101.243 kg)  BMI 34.95 kg/m2  SpO2 96%        Objective:   Physical Exam  Constitutional:  He is oriented to person, place, and time. He appears well-developed and well-nourished. No distress.  HENT:  Head: Normocephalic and atraumatic.  Right Ear: Tympanic membrane and ear canal normal.  Left Ear: Tympanic membrane and ear canal normal.  Mouth/Throat: Posterior oropharyngeal erythema present. No oropharyngeal exudate or posterior oropharyngeal edema.  Cardiovascular: Normal rate and regular rhythm.   No murmur heard. Pulmonary/Chest: Effort normal and breath sounds normal. No respiratory distress. He has no wheezes. He has no rales.  Musculoskeletal: He exhibits no edema.  Neurological: He is alert and oriented to person, place, and time.  Skin: Skin is warm and dry.  Psychiatric: He has a normal mood and affect. His behavior is normal. Thought content normal.          Assessment & Plan:  Symptoms most consistent with viral URI. Rapid strep is negative. We discussed supportive measures,  tylenol prn, follow up if symptoms worsen or do not improve. Pt verbalizes understanding.

## 2015-05-26 NOTE — Patient Instructions (Signed)
Upper Respiratory Infection, Adult An upper respiratory infection (URI) is also sometimes known as the common cold. The upper respiratory tract includes the nose, sinuses, throat, trachea, and bronchi. Bronchi are the airways leading to the lungs. Most people improve within 1 week, but symptoms can last up to 2 weeks. A residual cough may last even longer.  CAUSES Many different viruses can infect the tissues lining the upper respiratory tract. The tissues become irritated and inflamed and often become very moist. Mucus production is also common. A cold is contagious. You can easily spread the virus to others by oral contact. This includes kissing, sharing a glass, coughing, or sneezing. Touching your mouth or nose and then touching a surface, which is then touched by another person, can also spread the virus. SYMPTOMS  Symptoms typically develop 1 to 3 days after you come in contact with a cold virus. Symptoms vary from person to person. They may include:  Runny nose.  Sneezing.  Nasal congestion.  Sinus irritation.  Sore throat.  Loss of voice (laryngitis).  Cough.  Fatigue.  Muscle aches.  Loss of appetite.  Headache.  Low-grade fever. DIAGNOSIS  You might diagnose your own cold based on familiar symptoms, since most people get a cold 2 to 3 times a year. Your caregiver can confirm this based on your exam. Most importantly, your caregiver can check that your symptoms are not due to another disease such as strep throat, sinusitis, pneumonia, asthma, or epiglottitis. Blood tests, throat tests, and X-rays are not necessary to diagnose a common cold, but they may sometimes be helpful in excluding other more serious diseases. Your caregiver will decide if any further tests are required. RISKS AND COMPLICATIONS  You may be at risk for a more severe case of the common cold if you smoke cigarettes, have chronic heart disease (such as heart failure) or lung disease (such as asthma), or if  you have a weakened immune system. The very young and very old are also at risk for more serious infections. Bacterial sinusitis, middle ear infections, and bacterial pneumonia can complicate the common cold. The common cold can worsen asthma and chronic obstructive pulmonary disease (COPD). Sometimes, these complications can require emergency medical care and may be life-threatening. PREVENTION  The best way to protect against getting a cold is to practice good hygiene. Avoid oral or hand contact with people with cold symptoms. Wash your hands often if contact occurs. There is no clear evidence that vitamin C, vitamin E, echinacea, or exercise reduces the chance of developing a cold. However, it is always recommended to get plenty of rest and practice good nutrition. TREATMENT  Treatment is directed at relieving symptoms. There is no cure. Antibiotics are not effective, because the infection is caused by a virus, not by bacteria. Treatment may include:  Increased fluid intake. Sports drinks offer valuable electrolytes, sugars, and fluids.  Breathing heated mist or steam (vaporizer or shower).  Eating chicken soup or other clear broths, and maintaining good nutrition.  Getting plenty of rest.  Using gargles or lozenges for comfort.  Controlling fevers with ibuprofen or acetaminophen as directed by your caregiver.  Increasing usage of your inhaler if you have asthma. Zinc gel and zinc lozenges, taken in the first 24 hours of the common cold, can shorten the duration and lessen the severity of symptoms. Pain medicines may help with fever, muscle aches, and throat pain. A variety of non-prescription medicines are available to treat congestion and runny nose. Your caregiver   can make recommendations and may suggest nasal or lung inhalers for other symptoms.  HOME CARE INSTRUCTIONS   Only take over-the-counter or prescription medicines for pain, discomfort, or fever as directed by your  caregiver.  Use a warm mist humidifier or inhale steam from a shower to increase air moisture. This may keep secretions moist and make it easier to breathe.  Drink enough water and fluids to keep your urine clear or pale yellow.  Rest as needed.  Return to work when your temperature has returned to normal or as your caregiver advises. You may need to stay home longer to avoid infecting others. You can also use a face mask and careful hand washing to prevent spread of the virus. SEEK MEDICAL CARE IF:   After the first few days, you feel you are getting worse rather than better.  You need your caregiver's advice about medicines to control symptoms.  You develop chills, worsening shortness of breath, or brown or red sputum. These may be signs of pneumonia.  You develop yellow or brown nasal discharge or pain in the face, especially when you bend forward. These may be signs of sinusitis.  You develop a fever, swollen neck glands, pain with swallowing, or white areas in the back of your throat. These may be signs of strep throat. SEEK IMMEDIATE MEDICAL CARE IF:   You have a fever.  You develop severe or persistent headache, ear pain, sinus pain, or chest pain.  You develop wheezing, a prolonged cough, cough up blood, or have a change in your usual mucus (if you have chronic lung disease).  You develop sore muscles or a stiff neck. Document Released: 02/02/2001 Document Revised: 11/01/2011 Document Reviewed: 11/14/2013 Exit[employer] Patient Information 2015 [employer]. This information is not intended to replace advice given to you by your health care provider. Make sure you discuss any questions you have with your health care provider.  

## 2015-06-13 ENCOUNTER — Encounter: Payer: Self-pay | Admitting: Family

## 2015-06-13 ENCOUNTER — Ambulatory Visit (INDEPENDENT_AMBULATORY_CARE_PROVIDER_SITE_OTHER): Payer: BLUE CROSS/BLUE SHIELD | Admitting: Family

## 2015-06-13 VITALS — BP 114/72 | HR 64 | Temp 98.3°F | Ht 67.0 in | Wt 223.5 lb

## 2015-06-13 DIAGNOSIS — D649 Anemia, unspecified: Secondary | ICD-10-CM

## 2015-06-13 DIAGNOSIS — R739 Hyperglycemia, unspecified: Secondary | ICD-10-CM

## 2015-06-13 DIAGNOSIS — I10 Essential (primary) hypertension: Secondary | ICD-10-CM | POA: Diagnosis not present

## 2015-06-13 DIAGNOSIS — K219 Gastro-esophageal reflux disease without esophagitis: Secondary | ICD-10-CM | POA: Diagnosis not present

## 2015-06-13 LAB — BASIC METABOLIC PANEL
BUN: 16 mg/dL (ref 6–23)
CO2: 31 mEq/L (ref 19–32)
CREATININE: 0.89 mg/dL (ref 0.40–1.50)
Calcium: 9.2 mg/dL (ref 8.4–10.5)
Chloride: 104 mEq/L (ref 96–112)
GFR: 92.51 mL/min (ref >60.00)
GLUCOSE: 82 mg/dL (ref 70–99)
Potassium: 3.8 mEq/L (ref 3.5–5.1)
Sodium: 140 mEq/L (ref 135–145)

## 2015-06-13 LAB — CBC WITH DIFFERENTIAL/PLATELET
BASOS ABS: 0 10*3/uL (ref 0.0–0.1)
Basophils Relative: 0.6 % (ref 0.0–3.0)
Eosinophils Absolute: 0.2 10*3/uL (ref 0.0–0.7)
Eosinophils Relative: 2 % (ref 0.0–5.0)
HCT: 39.4 % (ref 39.0–52.0)
Hemoglobin: 13 g/dL (ref 13.0–17.0)
LYMPHS ABS: 1.6 10*3/uL (ref 0.7–4.0)
Lymphocytes Relative: 17.8 % (ref 12.0–46.0)
MCHC: 33.1 g/dL (ref 30.0–36.0)
MCV: 85.2 fl (ref 78.0–100.0)
MONO ABS: 0.6 10*3/uL (ref 0.1–1.0)
MONOS PCT: 6.5 % (ref 3.0–12.0)
NEUTROS PCT: 73.1 % (ref 43.0–77.0)
Neutro Abs: 6.6 10*3/uL (ref 1.4–7.7)
Platelets: 246 10*3/uL (ref 150.0–400.0)
RBC: 4.62 Mil/uL (ref 4.22–5.81)
RDW: 15.7 % — ABNORMAL HIGH (ref 11.5–15.5)
WBC: 9 10*3/uL (ref 4.0–10.5)

## 2015-06-13 LAB — HEMOGLOBIN A1C: HEMOGLOBIN A1C: 5.7 % (ref 4.6–6.5)

## 2015-06-13 MED ORDER — HYDROCHLOROTHIAZIDE 12.5 MG PO CAPS: 12.5000 mg | ORAL_CAPSULE | Freq: Every day | ORAL | Status: DC

## 2015-06-13 MED ORDER — AMLODIPINE BESY-BENAZEPRIL HCL 10-20 MG PO CAPS: 1.0000 | ORAL_CAPSULE | Freq: Every day | ORAL | Status: DC

## 2015-06-13 MED ORDER — POTASSIUM CHLORIDE ER 10 MEQ PO TBCR: 20.0000 meq | EXTENDED_RELEASE_TABLET | Freq: Every day | ORAL | Status: DC

## 2015-06-13 NOTE — Assessment & Plan Note (Signed)
Stable on PPI 

## 2015-06-13 NOTE — Progress Notes (Signed)
Pre visit review using our clinic review tool, if applicable. No additional management support is needed unless otherwise documented below in the visit note. 

## 2015-06-13 NOTE — Assessment & Plan Note (Signed)
Obtain follow up A1C.   

## 2015-06-13 NOTE — Assessment & Plan Note (Signed)
BP stable on current meds. Continue same, obtain bmet.  He finds kdur 20 hard to swallow. Will change to Kdur 10 two tabs once daily to see if this is easier for you.

## 2015-06-13 NOTE — Patient Instructions (Signed)
Please complete lab work prior to leaving.   

## 2015-06-13 NOTE — Progress Notes (Signed)
Subjective:    [name], male    DOB: 07/15/1956, 59 y.o.   MRN: [id number]  HPI  Jeremy [name] is a 59 yr old male who pesents today for follow up.  1) Hyperglycemia-  Lab Results  Component Value Date   HGBA1C 5.9 02/10/2015   2) HTN- currently maintained on hctz + lotrel.  BP Readings from Last 3 Encounters:  06/13/15 114/72  05/26/15 128/74  02/10/15 120/70    3) GERD- maintained on omeprazole 40mg .  Reports that his reflux is well controlled.    Review of Systems  Respiratory: Negative for shortness of breath.   Cardiovascular: Negative for chest pain and leg swelling.   See HPI  Past Medical History  Diagnosis Date  . Hypertension   . Hyperlipemia   . Joint pain, knee   . Nocturia   . Lung nodule   . GERD (gastroesophageal reflux disease)   . Anxiety   . Depression   . GERD (gastroesophageal reflux disease)   . Gastric nodule   . OSA (obstructive sleep apnea)     Social History   Social History  . Marital Status: Married    Spouse Name: N/A  . Number of Children: N/A  . Years of Education: N/A   Occupational History  . Not on file.   Social History Main Topics  . Smoking status: Former Research scientist (life sciences)  . Smokeless tobacco: Not on file  . Alcohol Use: No  . Drug Use: No  . Sexual Activity: Not on file   Other Topics Concern  . 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 Nose surgery    . Knee arthroscopy  1995  . Cardiac catheterization    . Total knee arthroplasty  10/11/2011    Procedure: TOTAL KNEE ARTHROPLASTY;  Surgeon: Rudean Haskell, MD;  Location: [place];  Service: Orthopedics;  Laterality: Right;    Family History  Problem Relation Age of Onset  . Anesthesia problems Neg Hx   . Hypotension Neg Hx     . Malignant hyperthermia Neg Hx   . Pseudochol deficiency Neg Hx   . Stroke Mother   . Hypertension Mother     No Known Allergies  Current Outpatient Prescriptions on File Prior to Visit  Medication Sig Dispense Refill  . amLODipine-benazepril (LOTREL) 10-20 MG per capsule Take 1 capsule by mouth daily. 30 capsule 5  . b complex vitamins tablet Take 1 tablet by mouth daily.    . fexofenadine (ALLEGRA) 180 MG tablet Take 180 mg by mouth daily.    . fluticasone (FLONASE) 50 MCG/ACT nasal spray Place 2 sprays into both nostrils daily. 16 g 0  . hydrochlorothiazide (MICROZIDE) 12.5 MG capsule Take 1 capsule (12.5 mg total) by mouth daily. 30 capsule 5  . Multiple Vitamins-Minerals (MULTIVITAMINS THER. W/MINERALS) TABS Take 1 tablet by mouth daily.    Marland Kitchen omeprazole (PRILOSEC) 40 MG capsule Take 40 mg by mouth daily.    . potassium chloride SA (K-DUR,KLOR-CON) 20 MEQ tablet Take 1 tablet (20 mEq total) by mouth daily. 30 tablet 5   No current facility-administered medications on file prior to visit.    BP 114/72 mmHg  Pulse 64  Temp(Src) 98.3 F (  36.8 C) (Oral)  Ht 5\' 7"  (1.702 m)  Wt 223 lb 8 oz (101.379 kg)  BMI 35.00 kg/m2  SpO2 95%        Objective:   Physical Exam  Constitutional: He is oriented to person, place, and time. He appears well-developed and well-nourished. No distress.  HENT:  Head: Normocephalic and atraumatic.  Cardiovascular: Normal rate and regular rhythm.   No murmur heard. Pulmonary/Chest: Effort normal and breath sounds normal. No respiratory distress. He has no wheezes. He has no rales.  Musculoskeletal: He exhibits no edema.  Lymphadenopathy:    He has no cervical adenopathy.  Neurological: He is alert and oriented to person, place, and time.  Skin: Skin is warm and dry.  Psychiatric: He has a normal mood and affect. His behavior is normal. Thought content normal.          Assessment & Plan:  Anemia- obtain follow up cbc.

## 2015-06-16 ENCOUNTER — Encounter: Payer: Self-pay | Admitting: Family

## 2015-06-30 ENCOUNTER — Observation Stay (HOSPITAL_BASED_OUTPATIENT_CLINIC_OR_DEPARTMENT_OTHER)
Admission: EM | Admit: 2015-06-30 | Discharge: 2015-07-01 | Disposition: A | Discharge status: Home / Self Care | Payer: BLUE CROSS/BLUE SHIELD | Attending: Internal Medicine | Admitting: Internal Medicine

## 2015-06-30 ENCOUNTER — Encounter: Payer: Self-pay | Admitting: Family

## 2015-06-30 ENCOUNTER — Emergency Department (HOSPITAL_BASED_OUTPATIENT_CLINIC_OR_DEPARTMENT_OTHER): Payer: BLUE CROSS/BLUE SHIELD

## 2015-06-30 ENCOUNTER — Ambulatory Visit (INDEPENDENT_AMBULATORY_CARE_PROVIDER_SITE_OTHER): Payer: BLUE CROSS/BLUE SHIELD | Admitting: Family

## 2015-06-30 ENCOUNTER — Encounter (HOSPITAL_BASED_OUTPATIENT_CLINIC_OR_DEPARTMENT_OTHER): Payer: Self-pay | Admitting: *Deleted

## 2015-06-30 VITALS — BP 130/80 | HR 62 | Temp 97.4°F | Resp 18 | Ht 67.0 in | Wt 226.8 lb

## 2015-06-30 DIAGNOSIS — Z8249 Family history of ischemic heart disease and other diseases of the circulatory system: Secondary | ICD-10-CM | POA: Diagnosis not present

## 2015-06-30 DIAGNOSIS — R0789 Other chest pain: Secondary | ICD-10-CM

## 2015-06-30 DIAGNOSIS — I110 Hypertensive heart disease with heart failure: Secondary | ICD-10-CM | POA: Diagnosis not present

## 2015-06-30 DIAGNOSIS — F419 Anxiety disorder, unspecified: Secondary | ICD-10-CM | POA: Diagnosis not present

## 2015-06-30 DIAGNOSIS — F418 Other specified anxiety disorders: Secondary | ICD-10-CM | POA: Diagnosis not present

## 2015-06-30 DIAGNOSIS — G4733 Obstructive sleep apnea (adult) (pediatric): Secondary | ICD-10-CM | POA: Diagnosis not present

## 2015-06-30 DIAGNOSIS — I5032 Chronic diastolic (congestive) heart failure: Secondary | ICD-10-CM | POA: Insufficient documentation

## 2015-06-30 DIAGNOSIS — R7303 Prediabetes: Secondary | ICD-10-CM | POA: Diagnosis not present

## 2015-06-30 DIAGNOSIS — K219 Gastro-esophageal reflux disease without esophagitis: Secondary | ICD-10-CM | POA: Diagnosis not present

## 2015-06-30 DIAGNOSIS — R079 Chest pain, unspecified: Secondary | ICD-10-CM

## 2015-06-30 DIAGNOSIS — I10 Essential (primary) hypertension: Secondary | ICD-10-CM | POA: Diagnosis not present

## 2015-06-30 DIAGNOSIS — Z87891 Personal history of nicotine dependence: Secondary | ICD-10-CM | POA: Diagnosis not present

## 2015-06-30 DIAGNOSIS — F32A Depression, unspecified: Secondary | ICD-10-CM | POA: Diagnosis present

## 2015-06-30 DIAGNOSIS — Z79899 Other long term (current) drug therapy: Secondary | ICD-10-CM | POA: Insufficient documentation

## 2015-06-30 DIAGNOSIS — F329 Major depressive disorder, single episode, unspecified: Secondary | ICD-10-CM | POA: Diagnosis not present

## 2015-06-30 DIAGNOSIS — Z7951 Long term (current) use of inhaled steroids: Secondary | ICD-10-CM | POA: Insufficient documentation

## 2015-06-30 DIAGNOSIS — E785 Hyperlipidemia, unspecified: Secondary | ICD-10-CM | POA: Diagnosis not present

## 2015-06-30 HISTORY — DX: Other intervertebral disc displacement, lumbar region: M51.26

## 2015-06-30 HISTORY — DX: Other intervertebral disc degeneration, lumbar region without mention of lumbar back pain or lower extremity pain: M51.369

## 2015-06-30 HISTORY — DX: Other intervertebral disc degeneration, lumbar region: M51.36

## 2015-06-30 HISTORY — DX: Bladder disorder, unspecified: N32.9

## 2015-06-30 HISTORY — DX: Personal history of other diseases of the digestive system: Z87.19

## 2015-06-30 HISTORY — DX: Migraine, unspecified, not intractable, without status migrainosus: G43.909

## 2015-06-30 LAB — BASIC METABOLIC PANEL
ANION GAP: 7 (ref 5–15)
BUN: 15 mg/dL (ref 6–20)
CALCIUM: 9 mg/dL (ref 8.9–10.3)
CO2: 29 mmol/L (ref 22–32)
CREATININE: 0.82 mg/dL (ref 0.61–1.24)
Chloride: 104 mmol/L (ref 101–111)
Glucose, Bld: 101 mg/dL — ABNORMAL HIGH (ref 65–99)
Potassium: 4.2 mmol/L (ref 3.5–5.1)
SODIUM: 140 mmol/L (ref 135–145)

## 2015-06-30 LAB — CBC
HCT: 38.2 % — ABNORMAL LOW (ref 39.0–52.0)
HEMOGLOBIN: 12.9 g/dL — AB (ref 13.0–17.0)
MCH: 28.4 pg (ref 26.0–34.0)
MCHC: 33.8 g/dL (ref 30.0–36.0)
MCV: 84.1 fL (ref 78.0–100.0)
PLATELETS: 274 10*3/uL (ref 150–400)
RBC: 4.54 MIL/uL (ref 4.22–5.81)
RDW: 14.1 % (ref 11.5–15.5)
WBC: 7.4 10*3/uL (ref 4.0–10.5)

## 2015-06-30 LAB — LIPID PANEL
CHOL/HDL RATIO: 4.7 ratio
Cholesterol: 200 mg/dL (ref 0–200)
HDL: 43 mg/dL (ref >40)
LDL CALC: 139 mg/dL — AB (ref 0–99)
Triglycerides: 91 mg/dL (ref <150)
VLDL: 18 mg/dL (ref 0–40)

## 2015-06-30 LAB — D-DIMER, QUANTITATIVE (NOT AT ARMC): D DIMER QUANT: 0.29 ug{FEU}/mL (ref 0.00–0.48)

## 2015-06-30 LAB — TROPONIN I
Troponin I: <0.03 ng/mL (ref <0.031)
Troponin I: <0.03 ng/mL (ref <0.031)

## 2015-06-30 LAB — TSH: TSH: 2.605 u[IU]/mL (ref 0.350–4.500)

## 2015-06-30 MED ORDER — AMLODIPINE BESY-BENAZEPRIL HCL 10-20 MG PO CAPS: 1.0000 | ORAL_CAPSULE | Freq: Every day | ORAL | Status: DC

## 2015-06-30 MED ORDER — ACETAMINOPHEN 325 MG PO TABS: 650.0000 mg | ORAL_TABLET | ORAL | Status: DC | PRN

## 2015-06-30 MED ORDER — POLYETHYLENE GLYCOL 3350 17 G PO PACK: 17.0000 g | PACK | Freq: Every day | ORAL | Status: DC | PRN

## 2015-06-30 MED ORDER — B COMPLEX-C PO TABS
1.0000 | ORAL_TABLET | Freq: Every day | ORAL | Status: DC
  Administered 2015-07-01: 1 via ORAL
  Filled 2015-06-30: qty 1

## 2015-06-30 MED ORDER — B COMPLEX PO TABS: 1.0000 | ORAL_TABLET | Freq: Every day | ORAL | Status: DC

## 2015-06-30 MED ORDER — ACETAMINOPHEN 650 MG RE SUPP: 650.0000 mg | Freq: Four times a day (QID) | RECTAL | Status: DC | PRN

## 2015-06-30 MED ORDER — POTASSIUM CHLORIDE ER 10 MEQ PO TBCR
20.0000 meq | EXTENDED_RELEASE_TABLET | Freq: Every day | ORAL | Status: DC
  Administered 2015-07-01: 20 meq via ORAL
  Filled 2015-06-30 (×2): qty 2

## 2015-06-30 MED ORDER — SODIUM CHLORIDE 0.9 % IV SOLN: 250.0000 mL | INTRAVENOUS | Status: DC | PRN

## 2015-06-30 MED ORDER — ASPIRIN EC 325 MG PO TBEC
325.0000 mg | DELAYED_RELEASE_TABLET | Freq: Every day | ORAL | Status: DC
  Administered 2015-07-01: 325 mg via ORAL
  Filled 2015-06-30: qty 1

## 2015-06-30 MED ORDER — SODIUM CHLORIDE 0.9 % IJ SOLN
3.0000 mL | Freq: Two times a day (BID) | INTRAMUSCULAR | Status: DC
  Administered 2015-06-30: 3 mL via INTRAVENOUS

## 2015-06-30 MED ORDER — GI COCKTAIL ~~LOC~~: 30.0000 mL | Freq: Four times a day (QID) | ORAL | Status: DC | PRN

## 2015-06-30 MED ORDER — NITROGLYCERIN 0.4 MG SL SUBL
0.4000 mg | SUBLINGUAL_TABLET | SUBLINGUAL | Status: AC | PRN
  Administered 2015-06-30 (×3): 0.4 mg via SUBLINGUAL
  Filled 2015-06-30: qty 1

## 2015-06-30 MED ORDER — SODIUM CHLORIDE 0.9 % IJ SOLN
3.0000 mL | Freq: Two times a day (BID) | INTRAMUSCULAR | Status: DC
  Administered 2015-06-30 – 2015-07-01 (×2): 3 mL via INTRAVENOUS

## 2015-06-30 MED ORDER — SODIUM CHLORIDE 0.9 % IJ SOLN: 3.0000 mL | INTRAMUSCULAR | Status: DC | PRN

## 2015-06-30 MED ORDER — LORATADINE 10 MG PO TABS
10.0000 mg | ORAL_TABLET | Freq: Every day | ORAL | Status: DC
  Administered 2015-07-01: 10 mg via ORAL
  Filled 2015-06-30: qty 1

## 2015-06-30 MED ORDER — HYDROCHLOROTHIAZIDE 12.5 MG PO CAPS
12.5000 mg | ORAL_CAPSULE | Freq: Every day | ORAL | Status: DC
  Administered 2015-07-01: 12.5 mg via ORAL
  Filled 2015-06-30: qty 1

## 2015-06-30 MED ORDER — FLUTICASONE PROPIONATE 50 MCG/ACT NA SUSP: 2.0000 | Freq: Every day | NASAL | Status: DC

## 2015-06-30 MED ORDER — ACETAMINOPHEN 325 MG PO TABS: 650.0000 mg | ORAL_TABLET | Freq: Four times a day (QID) | ORAL | Status: DC | PRN

## 2015-06-30 MED ORDER — MORPHINE SULFATE (PF) 2 MG/ML IV SOLN: 2.0000 mg | INTRAVENOUS | Status: DC | PRN

## 2015-06-30 MED ORDER — ADULT MULTIVITAMIN W/MINERALS CH
1.0000 | ORAL_TABLET | Freq: Every day | ORAL | Status: DC
Start: 2015-06-30 — End: 2015-07-01
  Administered 2015-07-01: 1 via ORAL
  Filled 2015-06-30 (×2): qty 1

## 2015-06-30 MED ORDER — ASPIRIN 81 MG PO CHEW
324.0000 mg | CHEWABLE_TABLET | Freq: Once | ORAL | Status: AC
  Administered 2015-06-30: 324 mg via ORAL
  Filled 2015-06-30: qty 4

## 2015-06-30 MED ORDER — AMLODIPINE BESYLATE 10 MG PO TABS
10.0000 mg | ORAL_TABLET | Freq: Every day | ORAL | Status: DC
  Administered 2015-07-01: 10 mg via ORAL
  Filled 2015-06-30: qty 1

## 2015-06-30 MED ORDER — PANTOPRAZOLE SODIUM 40 MG PO TBEC
40.0000 mg | DELAYED_RELEASE_TABLET | Freq: Every day | ORAL | Status: DC
  Administered 2015-07-01: 40 mg via ORAL
  Filled 2015-06-30: qty 1

## 2015-06-30 MED ORDER — HEPARIN SODIUM (PORCINE) 5000 UNIT/ML IJ SOLN
5000.0000 [IU] | Freq: Three times a day (TID) | INTRAMUSCULAR | Status: DC
  Administered 2015-06-30 – 2015-07-01 (×3): 5000 [IU] via SUBCUTANEOUS
  Filled 2015-06-30 (×3): qty 1

## 2015-06-30 MED ORDER — BENAZEPRIL HCL 20 MG PO TABS
20.0000 mg | ORAL_TABLET | Freq: Every day | ORAL | Status: DC
  Administered 2015-07-01: 20 mg via ORAL
  Filled 2015-06-30: qty 1

## 2015-06-30 MED ORDER — ONDANSETRON HCL 4 MG/2ML IJ SOLN: 4.0000 mg | Freq: Four times a day (QID) | INTRAMUSCULAR | Status: DC | PRN

## 2015-06-30 NOTE — ED Notes (Signed)
Dr Tamera Punt given repeat ekg to review and updated on pt's VS and chest pain

## 2015-06-30 NOTE — Progress Notes (Signed)
Subjective:    [name], male    DOB: 07-10-1956, 59 y.o.   MRN: [id number]  HPI  Mr. Rakestraw is a 59 yr old male with hx of HTN who presents today with c/o intermittent dizziness x 2 weeks. Also had episode of chest pressure this AM.  Reports that he has had 2 stress tests "a while back" they were both reportedly normal.  Reports that he was picking up 40-50 pound buckets this AM when he developed chest pressure.  Reports that he still has a little bit of substernal chest pressure currently.    Also notes numbness in bilateral arms/hands x 2 weeks.  Yesterday, noted stiffness and pain when turning his neck.  Notes that he has numbness/ and tingling in bilateral hands/forearms.     Review of Systems See HPI  Past Medical History  Diagnosis Date  . Hypertension   . Hyperlipemia   . Joint pain, knee   . Nocturia   . Lung nodule   . GERD (gastroesophageal reflux disease)   . Anxiety   . Depression   . GERD (gastroesophageal reflux disease)   . Gastric nodule   . OSA (obstructive sleep apnea)     Social History   Social History  . Marital Status: Married    Spouse Name: N/A  . Number of Children: N/A  . Years of Education: N/A   Occupational History  . Not on file.   Social History Main Topics  . Smoking status: Former Research scientist (life sciences)  . Smokeless tobacco: Not on file  . Alcohol Use: No  . Drug Use: No  . Sexual Activity: Not on file   Other Topics Concern  . 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 Nose surgery    . Knee arthroscopy  1995  . Cardiac catheterization    . Total knee arthroplasty  10/11/2011    Procedure: TOTAL KNEE ARTHROPLASTY;  Surgeon: Rudean Haskell, MD;  Location: [place];  Service: Orthopedics;  Laterality:  Right;    Family History  Problem Relation Age of Onset  . Anesthesia problems Neg Hx   . Hypotension Neg Hx   . Malignant hyperthermia Neg Hx   . Pseudochol deficiency Neg Hx   . Stroke Mother   . Hypertension Mother     No Known Allergies  Current Outpatient Prescriptions on File Prior to Visit  Medication Sig Dispense Refill  . b complex vitamins tablet Take 1 tablet by mouth daily.    . fexofenadine (ALLEGRA) 180 MG tablet Take 180 mg by mouth daily.    . fluticasone (FLONASE) 50 MCG/ACT nasal spray Place 2 sprays into both nostrils daily. 16 g 0  . hydrochlorothiazide (MICROZIDE) 12.5 MG capsule Take 1 capsule (12.5 mg total) by mouth daily. 30 capsule 5  . Multiple Vitamins-Minerals (MULTIVITAMINS THER. W/MINERALS) TABS Take 1 tablet by mouth daily.    Marland Kitchen omeprazole (PRILOSEC) 40 MG capsule Take 40 mg by mouth daily.    . potassium chloride (K-DUR) 10 MEQ tablet Take 2 tablets (20 mEq total) by mouth daily. 60 tablet 5   No current facility-administered medications on file prior to visit.    BP 130/80 mmHg  Pulse 62  Temp(Src)  97.4 F (36.3 C) (Oral)  Resp 18  Ht 5\' 7"  (1.702 m)  Wt 226 lb 12.8 oz (102.876 kg)  BMI 35.51 kg/m2       Objective:   Physical Exam  Constitutional: He is oriented to person, place, and time. He appears well-developed and well-nourished. No distress.  HENT:  Head: Normocephalic and atraumatic.  Cardiovascular: Normal rate and regular rhythm.   No murmur heard. Pulmonary/Chest: Effort normal and breath sounds normal. No respiratory distress. He has no wheezes. He has no rales.  Musculoskeletal: He exhibits no edema.       Cervical back: He exhibits tenderness.  Bilateral UE strength/hand grips is 5/5 Mild anterior chest wall tenderness to palpation  Neurological: He is alert and oriented to person, place, and time.  Skin: Skin is warm and dry.  Psychiatric: He has a normal mood and affect. His behavior is normal. Thought content  normal.          Assessment & Plan:  Atypical CP-  Pt has several risk factors for CAD (Age, obesity, HTN,  hyperglycemia, htn)  EKG tracing is personally reviewed.  EKG notes NSR.  No acute changes.  Due to ongoing chest pressure, I have advised pt that I think it would be best to have him evaluated in the ED to rule out MI.  He is agreeable.  Report given to Dr. Tamera Punt, ED physician on duty downstairs.   We discussed that we will work up his neck pain/hand numbness next visit in 1 week after we have cleared him from a cardiac standpoint.

## 2015-06-30 NOTE — ED Provider Notes (Signed)
CSN: [id number]     Arrival date & time 06/30/15  1002 History   First MD Initiated Contact with Patient 06/30/15 1012     Chief Complaint  Patient presents with  . Chest Pain     (Consider location/radiation/quality/duration/timing/severity/associated sxs/prior Treatment) HPI Comments: Patient presents with chest pain. He has a history of hypertension and hyperlipidemia. He states he's prediabetic and controls this with diet. He presents after being seen by his PCP this morning with intermittent chest pain. He describes a tightness/heaviness across his chest is been going on intermittently for the last 2 weeks. At times it's exertional but not every time. He does have some associated shortness of breath at times. Today he had some lightheadedness and nausea associated with the pain. There's been no diaphoresis. He has no cough or congestion. He describes a radiating pain across his chest and yesterday it radiated to his neck. His pain today started about 6:00 as he was lifting some buckets at work. He states that it was worse earlier but these had a constant dull aching since 6:00. He denies any leg pain or swelling. He has a former history of smoking but quit 30 years ago. He has a family history of heart disease and one uncle who died of a heart attack at age 53.  Patient is a 59 y.o. male presenting with chest pain.  Chest Pain Associated symptoms: fatigue, nausea and shortness of breath   Associated symptoms: no abdominal pain, no back pain, no cough, no diaphoresis, no dizziness, no fever, no headache, no numbness, not vomiting and no weakness     Past Medical History  Diagnosis Date  . Hypertension   . Hyperlipemia   . Joint pain, knee   . Nocturia   . Lung nodule   . GERD (gastroesophageal reflux disease)   . Anxiety   . Depression   . GERD (gastroesophageal reflux disease)   . Gastric nodule   . OSA (obstructive sleep apnea)    Past Surgical History  Procedure Laterality  Date  . Nose surgery    . Knee arthroscopy  1995  . Cardiac catheterization    . Total knee arthroplasty  10/11/2011    Procedure: TOTAL KNEE ARTHROPLASTY;  Surgeon: Rudean Haskell, MD;  Location: [place];  Service: Orthopedics;  Laterality: Right;   Family History  Problem Relation Age of Onset  . Anesthesia problems Neg Hx   . Hypotension Neg Hx   . Malignant hyperthermia Neg Hx   . Pseudochol deficiency Neg Hx   . Stroke Mother   . Hypertension Mother    Social History  Substance Use Topics  . Smoking status: Former Research scientist (life sciences)  . Smokeless tobacco: None  . Alcohol Use: No    Review of Systems  Constitutional: Positive for fatigue. Negative for fever, chills and diaphoresis.  HENT: Negative for congestion, rhinorrhea and sneezing.   Eyes: Negative.   Respiratory: Positive for shortness of breath. Negative for cough and chest tightness.   Cardiovascular: Positive for chest pain. Negative for leg swelling.  Gastrointestinal: Positive for nausea. Negative for vomiting, abdominal pain, diarrhea and blood in stool.  Genitourinary: Negative for frequency, hematuria, flank pain and difficulty urinating.  Musculoskeletal: Negative for back pain and arthralgias.  Skin: Negative for rash.  Neurological: Negative for dizziness, speech difficulty, weakness, numbness and headaches.      Allergies  Review of patient's allergies indicates no known allergies.  Home Medications   Prior to Admission medications   Medication  Sig Start Date End Date Taking? Authorizing Provider  amLODipine-benazepril (LOTREL) 10-20 MG capsule Take 1 capsule by mouth daily. 06/30/15  Yes [name], NP  b complex vitamins tablet Take 1 tablet by mouth daily.   Yes Historical Provider, MD  fexofenadine (ALLEGRA) 180 MG tablet Take 180 mg by mouth daily.   Yes Historical Provider, MD  fluticasone (FLONASE) 50 MCG/ACT nasal spray Place 2 sprays into both nostrils daily. 10/10/14  Yes [name], NP   hydrochlorothiazide (MICROZIDE) 12.5 MG capsule Take 1 capsule (12.5 mg total) by mouth daily. 06/13/15  Yes [name], NP  Multiple Vitamins-Minerals (MULTIVITAMINS THER. W/MINERALS) TABS Take 1 tablet by mouth daily.   Yes Historical Provider, MD  omeprazole (PRILOSEC) 40 MG capsule Take 40 mg by mouth daily.   Yes Historical Provider, MD  potassium chloride (K-DUR) 10 MEQ tablet Take 2 tablets (20 mEq total) by mouth daily. 06/13/15  Yes [name], NP   BP 114/78 mmHg  Pulse 69  Temp(Src) 97.9 F (36.6 C) (Oral)  Resp 18  Ht 5\' 7"  (1.702 m)  Wt 226 lb (102.513 kg)  BMI 35.39 kg/m2  SpO2 100% Physical Exam  Constitutional: He is oriented to person, place, and time. He appears well-developed and well-nourished.  HENT:  Head: Normocephalic and atraumatic.  Eyes: Pupils are equal, round, and reactive to light.  Neck: Normal range of motion. Neck supple.  Cardiovascular: Normal rate, regular rhythm and normal heart sounds.   Pulmonary/Chest: Effort normal and breath sounds normal. No respiratory distress. He has no wheezes. He has no rales. He exhibits no tenderness.  Abdominal: Soft. Bowel sounds are normal. There is no tenderness. There is no rebound and no guarding.  Musculoskeletal: Normal range of motion. He exhibits no edema.  Lymphadenopathy:    He has no cervical adenopathy.  Neurological: He is alert and oriented to person, place, and time.  Skin: Skin is warm and dry. No rash noted.  Psychiatric: He has a normal mood and affect.    ED Course  Procedures (including critical care time) Labs Review Labs Reviewed  BASIC METABOLIC PANEL - Abnormal; Notable for the following:    Glucose, Bld 101 (*)    All other components within normal limits  CBC - Abnormal; Notable for the following:    Hemoglobin 12.9 (*)    HCT 38.2 (*)    All other components within normal limits  TROPONIN I  D-DIMER, QUANTITATIVE (NOT AT [facility])    Imaging Review Dg Chest 2  View  06/30/2015  CLINICAL DATA:  Chest pain for 2 weeks EXAM: CHEST  2 VIEW COMPARISON:  None. FINDINGS: There is no appreciable edema or consolidation. Heart size and pulmonary vascularity are normal. No adenopathy. There is degenerative change in the thoracic spine. IMPRESSION: No edema or consolidation. Electronically Signed   By: Lowella Grip III M.D.   On: 06/30/2015 11:07   I have personally reviewed and evaluated these images and lab results as part of my medical decision-making.   EKG Interpretation   Date/Time:  Monday June 30 2015 11:10:45 EST Ventricular Rate:  65 PR Interval:  158 QRS Duration: 92 QT Interval:  398 QTC Calculation: 413 R Axis:   42 Text Interpretation:  Normal sinus rhythm Normal ECG No old tracing to  compare Confirmed by Rece Zechman  MD, Khari Mally ([id number]) on 06/30/2015 10:23:58 AM      MDM   Final diagnoses:  Chest pain, unspecified chest pain type    Patient presents  with chest pain intermittently for the last 2 weeks although it was worse this morning. He has a heart score of 4. He currently has improved pain but still has some slight dull aching. He was given aspirin and nitroglycerin in the ED. His chest x-ray is clear without evidence of pneumonia or pneumothorax. His EKG does not show acute ischemia. His troponin is negative. Spoke with Dr. Dyann Kief who is accepted patient for transfer to [facility] for further cardiac evaluation. His symptoms do not sound consistent with dissection or PE.    He did have 2 nitroglycerin with some improvement of symptoms. After the second nitroglycerin he has a marked worsening of his chest pain. A repeat EKG did not show any changes. He was given a third nitroglycerin with total relief in symptoms. His oxygen saturation at times has gone down to 92 or 93%. He is on nasal cannula 2 L. He doesn't report any shortness of breath. His d-dimer is negative. He doesn't have any other symptoms that would be more suggestive of  pulmonary embolus.  Malvin Johns, MD 06/30/15 1247

## 2015-06-30 NOTE — Progress Notes (Signed)
Pre visit review using our clinic review tool, if applicable. No additional management support is needed unless otherwise documented below in the visit note. 

## 2015-06-30 NOTE — ED Notes (Signed)
Repeat EKG after Nitro

## 2015-06-30 NOTE — Plan of Care (Signed)
Problem: Consults Goal: Tobacco Cessation referral if indicated Outcome: Not Applicable Date Met:  57/49/35 Patient does not smoke. Goal: Diabetes Guidelines if Diabetic/Glucose > 140 If diabetic or lab glucose is > 140 mg/dl - Initiate Diabetes/Hyperglycemia Guidelines & Document Interventions  Outcome: Not Applicable Date Met:  52/17/47 Patient is not diabetic.

## 2015-06-30 NOTE — Progress Notes (Signed)
Called by Dr. Tamera Punt at [facility] to admit Jeremy [name] for ACS r/o. Patient with hx of HTN, HLD, pre-diabetes and GERD; who presented with complaints of CP and associated SOB. Symptoms on/off for over 1 week now, but worse today. Patient Heart Score 3-4. Accepted to telemetry, observation.   Jeremy [name] [phone]

## 2015-06-30 NOTE — ED Notes (Signed)
MD at bedside. 

## 2015-06-30 NOTE — ED Notes (Addendum)
Chest pain x 2 weeks- states pain is intermittent and he feels it in different areas of his chest at different times- reports dizziness with walking- Sent here from Dr Rowe Pavy office for further eval

## 2015-06-30 NOTE — Patient Instructions (Signed)
Please proceed to the ER on the first floor.  

## 2015-06-30 NOTE — Plan of Care (Signed)
Problem: Phase I Progression Outcomes Goal: Aspirin unless contraindicated Outcome: Completed/Met Date Met:  06/30/15 324 aspirin given at [facility].

## 2015-06-30 NOTE — Progress Notes (Signed)
Pt refused CPAP, does not wear CPAP at home.

## 2015-06-30 NOTE — H&P (Signed)
[facility] History and Physical  Jeremy [name] ZOX:[id number] DOB: 1956-04-17 DOA: 06/30/2015  Referring physician: Dyann Kief PCP: Nance Pear., NP   Chief Complaint: chest pain  HPI: Jeremy [name] is a very pleasant 59 y.o. male past medical history that includes hypertension, hyperlipidemia, GERD, anxiety, depression presents to room 2 W. 80 from PCPs office chief complaint chest pain. Given patient's risk factors is being admitted to rule out.  Patient reports left anterior chest pain/pressure intermittently for the last several months. However the last 2 days he reports worsening pain located substernally radiating left and right as well as left shoulder. Associated symptoms include dizziness shortness of breath nausea without vomiting bilateral upper extremity tingling as well as neck pain. He reports picking up 4050 pound buckets earlier today. He denies lower extremity edema orthopnea cough fever chills. He denies dysuria hematuria frequency or urgency.  Workup in the emergency department includes complete blood count is unremarkable basic metabolic panel unremarkable EKG with sinus rhythm chest x-ray no acute cardiopulmonary abnormality. Initial troponin is negative.  In the emergency department he's afebrile hemodynamically stable and not hypoxic  Review of Systems:  10 point review of systems complete and all systems are negative except as indicated in the history of present illness Past Medical History  Diagnosis Date  . Hypertension   . Hyperlipemia   . Joint pain, knee   . Nocturia   . Lung nodule   . GERD (gastroesophageal reflux disease)   . Anxiety   . Depression   . GERD (gastroesophageal reflux disease)   . Gastric nodule   . OSA (obstructive sleep apnea)    Past Surgical History  Procedure Laterality Date  . Nose surgery    . Knee arthroscopy  1995  . Cardiac catheterization    . Total knee arthroplasty  10/11/2011    Procedure: TOTAL KNEE  ARTHROPLASTY;  Surgeon: Rudean Haskell, MD;  Location: [facility];  Service: Orthopedics;  Laterality: Right;   Social History:  reports that he has quit smoking. He does not have any smokeless tobacco history on file. He reports that he does not drink alcohol or use illicit drugs. He is independent in ADLs currently employed full-time No Known Allergies  Family History  Problem Relation Age of Onset  . Anesthesia problems Neg Hx   . Hypotension Neg Hx   . Malignant hyperthermia Neg Hx   . Pseudochol deficiency Neg Hx   . Stroke Mother   . Hypertension Mother      Prior to Admission medications   Medication Sig Start Date End Date Taking? Authorizing Provider  amLODipine-benazepril (LOTREL) 10-20 MG capsule Take 1 capsule by mouth daily. 06/30/15  Yes [name], NP  b complex vitamins tablet Take 1 tablet by mouth daily.   Yes Historical Provider, MD  fexofenadine (ALLEGRA) 180 MG tablet Take 180 mg by mouth daily.   Yes Historical Provider, MD  fluticasone (FLONASE) 50 MCG/ACT nasal spray Place 2 sprays into both nostrils daily. Patient taking differently: Place 2 sprays into both nostrils daily as needed for allergies.  10/10/14  Yes [name], NP  hydrochlorothiazide (MICROZIDE) 12.5 MG capsule Take 1 capsule (12.5 mg total) by mouth daily. 06/13/15  Yes [name], NP  Multiple Vitamin (MULTIVITAMIN WITH MINERALS) TABS tablet Take 1 tablet by mouth daily.   Yes Historical Provider, MD  omeprazole (PRILOSEC) 40 MG capsule Take 40 mg by mouth daily.   Yes Historical Provider, MD  potassium chloride (K-DUR) 10 MEQ  tablet Take 2 tablets (20 mEq total) by mouth daily. 06/13/15  Yes [name], NP   Physical Exam: Filed Vitals:   06/30/15 1200 06/30/15 1230 06/30/15 1300 06/30/15 1407  BP: 114/78 123/73 120/75 147/72  Pulse: 69 58 66 61  Temp:    97.7 F (36.5 C)  TempSrc:    Oral  Resp:      Height:    5\' 7"  (1.702 m)  Weight:    104.4 kg (230 lb 2.6  oz)  SpO2: 100% 100% 97% 99%    Wt Readings from Last 3 Encounters:  06/30/15 104.4 kg (230 lb 2.6 oz)  06/30/15 102.876 kg (226 lb 12.8 oz)  06/13/15 101.379 kg (223 lb 8 oz)    General:  Appears calm and comfortable Eyes: PERRL, normal lids, irises & conjunctiva ENT: grossly normal hearing, lips & tongue Neck: no LAD, masses or thyromegaly Cardiovascular: RRR, no m/r/g. No LE edema. Mild end [facility] left anterior chest to palpation Telemetry: SR, no arrhythmias  Respiratory: CTA bilaterally, no w/r/r. Normal respiratory effort. Abdomen: soft, ntnd Skin: no rash or induration seen on limited exam Musculoskeletal: grossly normal tone BUE/BLE Psychiatric: grossly normal mood and affect, speech fluent and appropriate Neurologic: grossly non-focal. bi lateral grip 5 out of 5           Labs on Admission:  Basic Metabolic Panel:  Recent Labs Lab 06/30/15 1020  NA 140  K 4.2  CL 104  CO2 29  GLUCOSE 101*  BUN 15  CREATININE 0.82  CALCIUM 9.0   Liver Function Tests: No results for input(s): AST, ALT, ALKPHOS, BILITOT, PROT, ALBUMIN in the last 168 hours. No results for input(s): LIPASE, AMYLASE in the last 168 hours. No results for input(s): AMMONIA in the last 168 hours. CBC:  Recent Labs Lab 06/30/15 1020  WBC 7.4  HGB 12.9*  HCT 38.2*  MCV 84.1  PLT 274   Cardiac Enzymes:  Recent Labs Lab 06/30/15 1020  TROPONINI <0.03    BNP (last 3 results) No results for input(s): BNP in the last 8760 hours.  ProBNP (last 3 results) No results for input(s): PROBNP in the last 8760 hours.  CBG: No results for input(s): GLUCAP in the last 168 hours.  Radiological Exams on Admission: Dg Chest 2 View  06/30/2015  CLINICAL DATA:  Chest pain for 2 weeks EXAM: CHEST  2 VIEW COMPARISON:  None. FINDINGS: There is no appreciable edema or consolidation. Heart size and pulmonary vascularity are normal. No adenopathy. There is degenerative change in the thoracic spine.  IMPRESSION: No edema or consolidation. Electronically Signed   By: Lowella Grip III M.D.   On: 06/30/2015 11:07    EKG: Independently reviewed as noted above  Assessment/Plan Principal Problem:   Chest pain: Atypical. He does have some risk factors. Heart score 3. Will admit to telemetry to rule out. Will obtain serial EKGs cycle his troponin and provide supportive therapy in the form of oxygen as needed anti-medic and analgesia. When he rules out follow-up outpatient with cardiology may benefit from an outpatient stress test as well. Of note he states having had a stress test in the remote past at [facility].  Active Problems:   HTN (hypertension): Controlled. Will continue his home medications.    OSA (obstructive sleep apnea): Stable at baseline. No sleep apnea home.    GERD (gastroesophageal reflux disease): Stable at baseline. Continue PPI    Anxiety and depression: stable  Code Status: full DVT Prophylaxis: Family Communication: daughter at baseline Disposition Plan: home hopefully 24 hours  Time spent: 32 minutes  [facility]

## 2015-06-30 NOTE — ED Notes (Signed)
Pt's O2 sats 92-93% on RA- Oxygen applied at 2L [place]

## 2015-07-01 ENCOUNTER — Observation Stay (HOSPITAL_BASED_OUTPATIENT_CLINIC_OR_DEPARTMENT_OTHER): Payer: BLUE CROSS/BLUE SHIELD

## 2015-07-01 DIAGNOSIS — I5032 Chronic diastolic (congestive) heart failure: Secondary | ICD-10-CM

## 2015-07-01 DIAGNOSIS — R7303 Prediabetes: Secondary | ICD-10-CM | POA: Diagnosis not present

## 2015-07-01 DIAGNOSIS — R0789 Other chest pain: Secondary | ICD-10-CM | POA: Diagnosis not present

## 2015-07-01 DIAGNOSIS — R079 Chest pain, unspecified: Secondary | ICD-10-CM | POA: Diagnosis not present

## 2015-07-01 DIAGNOSIS — K219 Gastro-esophageal reflux disease without esophagitis: Secondary | ICD-10-CM

## 2015-07-01 DIAGNOSIS — G4733 Obstructive sleep apnea (adult) (pediatric): Secondary | ICD-10-CM | POA: Diagnosis not present

## 2015-07-01 DIAGNOSIS — I10 Essential (primary) hypertension: Secondary | ICD-10-CM | POA: Diagnosis not present

## 2015-07-01 DIAGNOSIS — E785 Hyperlipidemia, unspecified: Secondary | ICD-10-CM | POA: Diagnosis not present

## 2015-07-01 LAB — CBC
HEMATOCRIT: 38.4 % — AB (ref 39.0–52.0)
HEMOGLOBIN: 12.8 g/dL — AB (ref 13.0–17.0)
MCH: 28.1 pg (ref 26.0–34.0)
MCHC: 33.3 g/dL (ref 30.0–36.0)
MCV: 84.4 fL (ref 78.0–100.0)
Platelets: 264 10*3/uL (ref 150–400)
RBC: 4.55 MIL/uL (ref 4.22–5.81)
RDW: 14.2 % (ref 11.5–15.5)
WBC: 6.6 10*3/uL (ref 4.0–10.5)

## 2015-07-01 LAB — HEMOGLOBIN A1C
HEMOGLOBIN A1C: 6.1 % — AB (ref 4.8–5.6)
Mean Plasma Glucose: 128 mg/dL

## 2015-07-01 LAB — TROPONIN I

## 2015-07-01 MED ORDER — OMEPRAZOLE 40 MG PO CPDR: 40.0000 mg | DELAYED_RELEASE_CAPSULE | Freq: Two times a day (BID) | ORAL | Status: DC

## 2015-07-01 MED ORDER — DICLOFENAC SODIUM 75 MG PO TBEC: 75.0000 mg | DELAYED_RELEASE_TABLET | Freq: Two times a day (BID) | ORAL | Status: DC

## 2015-07-01 NOTE — Progress Notes (Signed)
  Echocardiogram 2D Echocardiogram has been performed.  Darlina Sicilian M 07/01/2015, 11:44 AM

## 2015-07-01 NOTE — Plan of Care (Signed)
Problem: Phase II Progression Outcomes Goal: Anginal pain relieved Outcome: Completed/Met Date Met:  07/01/15 Patient has not had any chest pain since admission.

## 2015-07-01 NOTE — Discharge Summary (Signed)
Physician Discharge Summary  Jeremy [name] ZOX:[id number] DOB: June 26, 1956 DOA: 06/30/2015  PCP: Nance Pear., NP  Admit date: 06/30/2015 Discharge date: 07/01/2015  Time spent: 30 minutes  Recommendations for Outpatient Follow-up:  1. Repeat basic metabolic panel to follow electrolytes and renal function 2. Reassess blood pressure and make adjustments to his antihypertensive regimen as needed   Discharge Diagnoses:  Principal Problem:   Chest pain Active Problems:   HTN (hypertension)   OSA (obstructive sleep apnea)   GERD (gastroesophageal reflux disease)   Anxiety and depression  chronic diastolic heart failure  Discharge Condition: Stable and improved. Patient has been discharged home and will follow with PCP in 1 week. Outpatient stress test to be arranged by cardiology service.  Diet recommendation: Heart healthy diet  Filed Weights   06/30/15 1015 06/30/15 1407 07/01/15 0448  Weight: 102.513 kg (226 lb) 104.4 kg (230 lb 2.6 oz) 99.791 kg (220 lb)    History of present illness:  Jeremy [name] is a very pleasant 59 y.o. male past medical history that includes hypertension, hyperlipidemia, GERD, anxiety, depression presents to room 2 W. 21 from PCPs office chief complaint chest pain. Given patient's risk factors is being admitted to rule out.  Patient reports left anterior chest pain/pressure intermittently for the last several months. However the last 2 days he reports worsening pain located substernally radiating left and right as well as left shoulder. Associated symptoms include dizziness shortness of breath nausea without vomiting bilateral upper extremity tingling as well as neck pain. He reports picking up 4050 pound buckets earlier today. He denies lower extremity edema orthopnea cough fever chills. He denies dysuria hematuria frequency or urgency.  Workup in the emergency department includes complete blood count is unremarkable basic metabolic panel  unremarkable EKG with sinus rhythm chest x-ray no acute cardiopulmonary abnormality. Initial troponin is negative.  Hospital Course:  1-chest pain: Atypical with typical features. Patient with a heart score of 4 -Admitted to telemetry (which revealed no acute ischemic abnormalities) -Troponins negative 4 -No acute ischemic changes on his EKG -2-D echo without acute wall motion abnormalities and preserved ejection fraction. -Case discussed with cardiology who recommended to treat GERD and musculoskeletal discomfort for and they will set up appointment for outpatient stress test. -Patient was discharge on twice a day omeprazole and also diclofenac -Advised to take medications as prescribed and to follow a heart healthy diet.  2-obstructive sleep apnea: A stable currently no using CPAP at home  3-GERD: Will continue PPI, but his medication has been adjusted to twice a day for better control.  4-anxiety/depression: Mood Stable. Currently without any suicidal ideation or hallucinations.   5-essential hypertension: Stable. -Will continue current antihypertensive regimen -Patient advise to follow heart healthy diet   6-chronic diastolic heart failure: With preserved ejection fraction -Continue blood pressure control -Patient currently using HCTZ -no signs of fluid overload on exam and with compensated condition -Advised to follow heart healthy diet.   Procedures: essential hypertension:  See below for x-ray reports  2-D echo - Left ventricle: The cavity size was normal. Wall thickness was normal. Systolic function was normal. The estimated ejection fraction was in the range of 55% to 60%. There is akinesis of the apicalanteroseptal myocardium. Doppler parameters are consistent with abnormal left ventricular relaxation (grade 1 diastolic dysfunction). - Aortic valve: There was mild regurgitation. - Aorta: Aortic root dimension: 42 mm (ED). - Ascending aorta: The ascending  aorta was mildly dilated. - Mitral valve: Calcified annulus. Mildly thickened leaflets .  Consultations:  None  Discharge Exam: Filed Vitals:   07/01/15 1428  BP: 115/73  Pulse: 67  Temp: 98.5 F (36.9 C)  Resp: 20    General: Afebrile, no further acute chest pain reported (but describes some pressure feeling), no shortness of breath, no palpitations, no nausea/vomiting. Cardiovascular: S1 and S2, no rubs, no gallops, no murmurs; no JVD Respiratory: Good air movement bilaterally, no wheezing, no crackles; no use of accessory muscles abdomen: Soft, positive bowel sounds, no guarding; no distention. Mild epigastric discomfort with deep palpation Neurologic exam: Alert, awake and oriented 3; no focal deficit.  Discharge Instructions   Discharge Instructions    Diet - low sodium heart healthy    Complete by:  As directed      Discharge instructions    Complete by:  As directed   Keep himself well-hydrated Arrange follow-up with PCP in one week Follow a heart healthy diet Take medications as prescribed Cardiology office will contact you with appointment details for outpatient stress test          Current Discharge Medication List    START taking these medications   Details  diclofenac (VOLTAREN) 75 MG EC tablet Take 1 tablet (75 mg total) by mouth 2 (two) times daily. To be taken schedule for 5 days and then PRN for pain. Qty: 20 tablet, Refills: 0      CONTINUE these medications which have CHANGED   Details  omeprazole (PRILOSEC) 40 MG capsule Take 1 capsule (40 mg total) by mouth 2 (two) times daily. Qty: 60 capsule, Refills: 2      CONTINUE these medications which have NOT CHANGED   Details  amLODipine-benazepril (LOTREL) 10-20 MG capsule Take 1 capsule by mouth daily. Qty: 90 capsule, Refills: 1    b complex vitamins tablet Take 1 tablet by mouth daily.    fexofenadine (ALLEGRA) 180 MG tablet Take 180 mg by mouth daily.    fluticasone (FLONASE) 50 MCG/ACT  nasal spray Place 2 sprays into both nostrils daily. Qty: 16 g, Refills: 0    hydrochlorothiazide (MICROZIDE) 12.5 MG capsule Take 1 capsule (12.5 mg total) by mouth daily. Qty: 30 capsule, Refills: 5    Multiple Vitamin (MULTIVITAMIN WITH MINERALS) TABS tablet Take 1 tablet by mouth daily.    potassium chloride (K-DUR) 10 MEQ tablet Take 2 tablets (20 mEq total) by mouth daily. Qty: 60 tablet, Refills: 5       No Known Allergies Follow-up Information    Follow up with O'SULLIVAN,MELISSA S., NP In 1 week.   Specialty:  Internal Medicine   Contact information:   [address] [place] [id number] [phone]       The results of significant diagnostics from this hospitalization (including imaging, microbiology, ancillary and laboratory) are listed below for reference.    Significant Diagnostic Studies: Dg Chest 2 View  06/30/2015  CLINICAL DATA:  Chest pain for 2 weeks EXAM: CHEST  2 VIEW COMPARISON:  None. FINDINGS: There is no appreciable edema or consolidation. Heart size and pulmonary vascularity are normal. No adenopathy. There is degenerative change in the thoracic spine. IMPRESSION: No edema or consolidation. Electronically Signed   By: Lowella Grip III M.D.   On: 06/30/2015 11:07   Labs: Basic Metabolic Panel:  Recent Labs Lab 06/30/15 1020  NA 140  K 4.2  CL 104  CO2 29  GLUCOSE 101*  BUN 15  CREATININE 0.82  CALCIUM 9.0   CBC:  Recent Labs Lab  06/30/15 1020 07/01/15 0153  WBC 7.4 6.6  HGB 12.9* 12.8*  HCT 38.2* 38.4*  MCV 84.1 84.4  PLT 274 264   Cardiac Enzymes:  Recent Labs Lab 06/30/15 1020 06/30/15 1654 06/30/15 2024 07/01/15 0153  TROPONINI <0.03 <0.03 <0.03 <0.03    Signed:  Barton Dubois  [facility] 07/01/2015, 3:27 PM

## 2015-07-01 NOTE — Progress Notes (Signed)
Pt/family given discharge instructions, medication lists, follow up appointments, and when to call the doctor.  Pt/family verbalizes understanding. Patient given discharge letter for work. Payton Emerald, RN

## 2015-07-02 ENCOUNTER — Telehealth: Payer: Self-pay

## 2015-07-02 NOTE — Telephone Encounter (Signed)
PCP: Nance Pear., NP  Admit date: 06/30/2015 Discharge date: 07/01/2015  Recommendations for Outpatient Follow-up:  1. Repeat basic metabolic panel to follow electrolytes and renal function 2. Reassess blood pressure and make adjustments to his antihypertensive regimen as needed 3. Outpatient stress test to be arranged by cardiology service.   Reason for admission:  Chest pain  Hospital follow up appt scheduled:  07/07/15 @ 5 pm with [name], NP.    Transition Care Management Follow-up Telephone Call  How have you been since you were released from the hospital? Pt states he's doing good. Still has slight pressure in the center of his chest, but denies shortness of breath, radiating pain, nausea/voming, diaphoresis.  Has new patient appt with Cards on 07/25/15.     Do you understand why you were in the hospital? yes   Do you understand the discharge instructions? yes  Items Reviewed:  Medications reviewed: yes, has not picked up diclofenac from pharmacy yet.  Allergies reviewed: yes  Dietary changes reviewed: yes, low sodium, heart healthy diet  Referrals reviewed: yes, Cardiology.  Has an appt scheduled.     Functional Questionnaire:   Activities of Daily Living (ADLs):   He states they are independent in the following: ambulation, bathing and hygiene, feeding, continence, grooming, toileting and dressing States they require assistance with the following: none   Any transportation issues/concerns?: no   Any patient concerns? no   Confirmed importance and date/time of follow-up visits scheduled: yes   Confirmed with patient if condition begins to worsen call PCP or go to the ER: yes

## 2015-07-07 ENCOUNTER — Ambulatory Visit (INDEPENDENT_AMBULATORY_CARE_PROVIDER_SITE_OTHER): Payer: BLUE CROSS/BLUE SHIELD | Admitting: Family

## 2015-07-07 ENCOUNTER — Encounter: Payer: Self-pay | Admitting: Family

## 2015-07-07 VITALS — BP 136/80 | HR 65 | Temp 97.6°F | Resp 16 | Ht 67.0 in | Wt 229.8 lb

## 2015-07-07 DIAGNOSIS — R0789 Other chest pain: Secondary | ICD-10-CM

## 2015-07-07 NOTE — Progress Notes (Signed)
Subjective:    [name], male    DOB: [birth year], 59 y.o.   MRN: [id number]  HPI  Jeremy [name] is a 59 yr old male who presents today for hospital follow up. Pt was seen in our office last with with CP. He was sent to the ED and admitted for overnight observation. Records are reviewed. Serial cardiac enzymes were normal. PPI was adjusted to BID.  2D echo showed normal LVEF and grade 1 diastolic dysfunction. He was scheduled with cardiology for outpatient consultation.  Today the patient reports that he continues to have intermittent CP. CP is non-tender.  Feels "like I need to belch." Has some SOB with exertion.    Did have improvement in his CP with addition of diclofenac.     Review of Systems Past Medical History  Diagnosis Date  . Hypertension   . Hyperlipemia   . Joint pain, knee   . Nocturia   . Lung nodule   . GERD (gastroesophageal reflux disease)   . Anxiety   . Depression   . GERD (gastroesophageal reflux disease)   . Gastric nodule   . OSA (obstructive sleep apnea)     "dr took me off the mask; told me to lose weight" (06/30/2015)  . History of hiatal hernia   . Migraine     06/30/2015 "maybe 2-3 times/year; not as bad as I used to have them"  . Bulging lumbar disc   . Bladder disease     "an unusual bladder disease; don't know what it's called" (06/30/2015)    Social History   Social History  . Marital Status: Married    Spouse Name: N/A  . Number of Children: N/A  . Years of Education: N/A   Occupational History  . Not on file.   Social History Main Topics  . Smoking status: Former Research scientist (life sciences)  . Smokeless tobacco: Never Used     Comment: "stopped smoking in ~ 1990"  . Alcohol Use: No  . Drug Use: No  . Sexual Activity: Yes   Other Topics Concern  . 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 Nasal septum surgery  ~ 1972  . Knee arthroscopy Right 1995  . Total knee arthroplasty  10/11/2011    Procedure: TOTAL KNEE ARTHROPLASTY;  Surgeon: Rudean Haskell, MD;  Location: [facility];  Service: Orthopedics;  Laterality: Right;  . Joint replacement    . Transurethral resection of bladder tumor with gyrus (turbt-gyrus)  ~2014  . Esophagogastroduodenoscopy    . Esophagogastroduodenoscopy (egd) with esophageal dilation  ~ 2014    Family History  Problem Relation Age of Onset  . Anesthesia problems Neg Hx   . Hypotension Neg Hx   . Malignant hyperthermia Neg Hx   . Pseudochol deficiency Neg Hx   . Stroke Mother   . Hypertension Mother     No Known Allergies  Current Outpatient Prescriptions on File Prior to Visit  Medication Sig Dispense Refill  . amLODipine-benazepril (LOTREL) 10-20 MG capsule Take 1 capsule by mouth daily. 90 capsule 1  . b complex vitamins tablet Take 1 tablet by mouth daily.    . diclofenac (VOLTAREN) 75 MG EC tablet Take 1 tablet (75 mg  total) by mouth 2 (two) times daily. To be taken schedule for 5 days and then PRN for pain. 20 tablet 0  . fexofenadine (ALLEGRA) 180 MG tablet Take 180 mg by mouth daily.    . fluticasone (FLONASE) 50 MCG/ACT nasal spray Place 2 sprays into both nostrils daily. (Patient taking differently: Place 2 sprays into both nostrils daily as needed for allergies. ) 16 g 0  . hydrochlorothiazide (MICROZIDE) 12.5 MG capsule Take 1 capsule (12.5 mg total) by mouth daily. 30 capsule 5  . Multiple Vitamin (MULTIVITAMIN WITH MINERALS) TABS tablet Take 1 tablet by mouth daily.    Marland Kitchen omeprazole (PRILOSEC) 40 MG capsule Take 1 capsule (40 mg total) by mouth 2 (two) times daily. 60 capsule 2  . potassium chloride (K-DUR) 10 MEQ tablet Take 2 tablets (20 mEq total) by mouth daily. 60 tablet 5   No current facility-administered medications on file prior to visit.     BP 136/80 mmHg  Pulse 65  Temp(Src) 97.6 F (36.4 C) (Oral)  Resp 16  Ht 5\' 7"  (1.702 m)  Wt 229 lb 12.8 oz (104.237 kg)  BMI 35.98 kg/m2  SpO2 98%       Objective:   Physical Exam  Constitutional: He is oriented to person, place, and time. He appears well-developed and well-nourished. No distress.  HENT:  Head: Normocephalic and atraumatic.  Cardiovascular: Normal rate and regular rhythm.   No murmur heard. Pulmonary/Chest: Effort normal and breath sounds normal. No respiratory distress. He has no wheezes. He has no rales.  Musculoskeletal: He exhibits no edema.  No chest wall tenderness to palpation  Neurological: He is alert and oriented to person, place, and time.  Skin: Skin is warm and dry.  Psychiatric: He has a normal mood and affect. His behavior is normal. Thought content normal.          Assessment & Plan:  Atypical CP-likely non-cardiac in nature. Advised pt to keep upcoming appointment with cardiology. Continue diclofenac prn and bid PPI for now.

## 2015-07-07 NOTE — Progress Notes (Signed)
Pre visit review using our clinic review tool, if applicable. No additional management support is needed unless otherwise documented below in the visit note. 

## 2015-07-07 NOTE — Patient Instructions (Addendum)
Please keep your upcoming appointment with Dr. Percival Spanish. You may use diclofenac as needed for chest pain. Follow up in April for your annual physical.

## 2015-07-08 ENCOUNTER — Ambulatory Visit: Payer: BLUE CROSS/BLUE SHIELD | Admitting: Family

## 2015-07-22 ENCOUNTER — Ambulatory Visit: Payer: BLUE CROSS/BLUE SHIELD | Admitting: Internal Medicine

## 2015-07-25 ENCOUNTER — Ambulatory Visit (INDEPENDENT_AMBULATORY_CARE_PROVIDER_SITE_OTHER): Payer: BLUE CROSS/BLUE SHIELD | Admitting: Cardiology

## 2015-07-25 ENCOUNTER — Encounter: Payer: Self-pay | Admitting: Cardiology

## 2015-07-25 VITALS — BP 122/80 | HR 72 | Ht 67.0 in | Wt 230.4 lb

## 2015-07-25 DIAGNOSIS — R072 Precordial pain: Secondary | ICD-10-CM | POA: Diagnosis not present

## 2015-07-25 NOTE — Patient Instructions (Signed)
Your physician recommends that you schedule a follow-up appointment in: As Needed  Your physician has requested that you have an exercise tolerance test. For further information please visit [email or web address]. Please also follow instruction sheet, as given.    

## 2015-07-25 NOTE — Progress Notes (Signed)
Cardiology Office Note   Date:  07/25/2015   ID:  Jeremy [name], DOB 07/27/1956, MRN [id number]  PCP:  Nance Pear., NP  Cardiologist:   Minus Breeding, MD   Chief Complaint  Patient presents with  . Chest Pain      History of Present Illness: Jeremy [name] is a 59 y.o. male who presents for evaluation of chest pain. He was hospitalized in early November for this. I reviewed these records. He described chest discomfort that had been going on for about 2-3 weeks prior to this. He described some discomfort it would happen if he was lifting something. It doesn't happen every time he lifts something. It might last for a few minutes. It is a vague 3 out of 10 discomfort that can be on the left side slightly in the left arm. Cardiac enzymes were negative in the hospital. EKG was unremarkable. He did have an echocardiogram which demonstrated well-preserved ejection fraction but no evidence of wall motion abnormality. There was some mild aortic insufficiency. He comes today for follow-up of this and said this pain is a stable pattern that is still occasionally  Happening. However, he can walk on a treadmill which she does routinely without bringing this on. He's not had prior cardiac history but he reports stress tests including nuclear test twice over the past many years.  Past Medical History  Diagnosis Date  . Hypertension   . Hyperlipemia   . Joint pain, knee   . GERD (gastroesophageal reflux disease)   . OSA (obstructive sleep apnea)     "dr took me off the mask; told me to lose weight" (06/30/2015)  . History of hiatal hernia   . Migraine     06/30/2015 "maybe 2-3 times/year; not as bad as I used to have them"  . Bulging lumbar disc   . Bladder disease     "an unusual bladder disease; don't know what it's called" (06/30/2015)  . Anemia 11/16/2014  . Hyperglycemia 11/16/2014    Past Surgical History  Procedure Laterality Date  . Nasal septum surgery  ~ 1972  . Knee  arthroscopy Right 1995  . Total knee arthroplasty  10/11/2011    Procedure: TOTAL KNEE ARTHROPLASTY;  Surgeon: Rudean Haskell, MD;  Location: [facility];  Service: Orthopedics;  Laterality: Right;  . Transurethral resection of bladder tumor with gyrus (turbt-gyrus)  ~2014  . Esophagogastroduodenoscopy    . Esophagogastroduodenoscopy (egd) with esophageal dilation  ~ 2014     Current Outpatient Prescriptions  Medication Sig Dispense Refill  . amLODipine-benazepril (LOTREL) 10-20 MG capsule Take 1 capsule by mouth daily. 90 capsule 1  . b complex vitamins tablet Take 1 tablet by mouth daily.    . diclofenac (VOLTAREN) 75 MG EC tablet Take 1 tablet (75 mg total) by mouth 2 (two) times daily. To be taken schedule for 5 days and then PRN for pain. 20 tablet 0  . fexofenadine (ALLEGRA) 180 MG tablet Take 180 mg by mouth daily.    . hydrochlorothiazide (MICROZIDE) 12.5 MG capsule Take 1 capsule (12.5 mg total) by mouth daily. 30 capsule 5  . Multiple Vitamin (MULTIVITAMIN WITH MINERALS) TABS tablet Take 1 tablet by mouth daily.    Marland Kitchen omeprazole (PRILOSEC) 40 MG capsule Take 1 capsule (40 mg total) by mouth 2 (two) times daily. 60 capsule 2  . potassium chloride (K-DUR) 10 MEQ tablet Take 2 tablets (20 mEq total) by mouth daily. 60 tablet 5   No current  facility-administered medications for this visit.    Allergies:   Review of patient's allergies indicates no known allergies.    Social History:  The patient  reports that he has quit smoking. His smoking use included Cigarettes. He has never used smokeless tobacco. He reports that he does not drink alcohol or use illicit drugs.   Family History:  The patient's family history includes Hypertension in his mother; Stroke (age of onset: 34) in his mother. There is no history of Anesthesia problems, Hypotension, Malignant hyperthermia, or Pseudochol deficiency.    ROS:  Please see the history of present illness.   Otherwise, review of systems are  positive for none.   All other systems are reviewed and negative.    PHYSICAL EXAM: VS:  BP 122/80 mmHg  Pulse 72  Ht 5\' 7"  (1.702 m)  Wt 230 lb 6 oz (104.497 kg)  BMI 36.07 kg/m2 , BMI Body mass index is 36.07 kg/(m^2). GENERAL:  Well appearing HEENT:  Pupils equal round and reactive, fundi not visualized, oral mucosa unremarkable NECK:  No jugular venous distention, waveform within normal limits, carotid upstroke brisk and symmetric, no bruits, no thyromegaly LYMPHATICS:  No cervical, inguinal adenopathy LUNGS:  Clear to auscultation bilaterally BACK:  No CVA tenderness CHEST:  Unremarkable HEART:  PMI not displaced or sustained,S1 and S2 within normal limits, no S3, no S4, no clicks, no rubs, no murmurs ABD:  Flat, positive bowel sounds normal in frequency in pitch, no bruits, no rebound, no guarding, no midline pulsatile mass, no hepatomegaly, no splenomegaly EXT:  2 plus pulses throughout, no edema, no cyanosis no clubbing SKIN:  No rashes no nodules NEURO:  Cranial nerves II through XII grossly intact, motor grossly intact throughout PSYCH:  Cognitively intact, oriented to person place and time    EKG:  EKG is not ordered today. The ekg ordered 11/7/16demonstrates sinus rhythm, rate 65, axis within normal limits, intervals within normal limits, no acute ST-T wave changes.   Recent Labs: 06/30/2015: BUN 15; Creatinine, Ser 0.82; Potassium 4.2; Sodium 140; TSH 2.605 07/01/2015: Hemoglobin 12.8*; Platelets 264    Lipid Panel    Component Value Date/Time   CHOL 200 06/30/2015 1604   TRIG 91 06/30/2015 1604   HDL 43 06/30/2015 1604   CHOLHDL 4.7 06/30/2015 1604   VLDL 18 06/30/2015 1604   LDLCALC 139* 06/30/2015 1604      Wt Readings from Last 3 Encounters:  07/25/15 230 lb 6 oz (104.497 kg)  07/07/15 229 lb 12.8 oz (104.237 kg)  07/01/15 220 lb (99.791 kg)      Other studies Reviewed: Additional studies/ records that were reviewed today include: ED  records. Review of the above records demonstrates:  Please see elsewhere in the note.     ASSESSMENT AND PLAN:  CHEST PAIN:  His pain is somewhat atypical.  I will bring the patient back for a POET (Plain Old Exercise Test). This will allow me to screen for obstructive coronary disease, risk stratify and very importantly provide a prescription for exercise.  DYSLIPIDEMIA:  In the absence of known coronary disease this lipid level dietary modification and we did discuss this.  OVERWEIGHT:  The patient understands the need to lose weight with diet and exercise. We have discussed specific strategies for this.  HTN: The blood pressure is at target. No change in medications is indicated. We will continue with therapeutic lifestyle changes (TLC).  Current medicines are reviewed at length with the patient today.  The patient does  not have concerns regarding medicines.  The following changes have been made:  no change  Labs/ tests ordered today include:   Orders Placed This Encounter  Procedures  . Exercise Tolerance Test     Disposition:   FU with me as needed.      Signed, Minus Breeding, MD  07/25/2015 4:36 PM    [facility]

## 2015-08-19 ENCOUNTER — Telehealth (HOSPITAL_COMMUNITY): Payer: Self-pay

## 2015-08-19 NOTE — Telephone Encounter (Signed)
Encounter complete. 

## 2015-08-21 ENCOUNTER — Ambulatory Visit (HOSPITAL_COMMUNITY)
Admission: RE | Admit: 2015-08-21 | Discharge: 2015-08-21 | Disposition: A | Discharge status: Home / Self Care | Payer: BLUE CROSS/BLUE SHIELD | Source: Ambulatory Visit | Attending: Cardiovascular Disease | Admitting: Cardiovascular Disease

## 2015-08-21 DIAGNOSIS — R072 Precordial pain: Secondary | ICD-10-CM | POA: Insufficient documentation

## 2015-08-21 LAB — EXERCISE TOLERANCE TEST
CHL CUP RESTING HR STRESS: 70 {beats}/min
CHL CUP STRESS STAGE 1 GRADE: 0 %
CHL CUP STRESS STAGE 1 HR: 72 {beats}/min
CHL CUP STRESS STAGE 1 SBP: 152 mmHg
CHL CUP STRESS STAGE 2 GRADE: 0 %
CHL CUP STRESS STAGE 2 SPEED: 0.8 mph
CHL CUP STRESS STAGE 4 GRADE: 10 %
CHL CUP STRESS STAGE 4 HR: 102 {beats}/min
CHL CUP STRESS STAGE 4 SPEED: 1.7 mph
CHL CUP STRESS STAGE 5 DBP: 79 mmHg
CHL CUP STRESS STAGE 5 GRADE: 12 %
CHL CUP STRESS STAGE 5 HR: 120 {beats}/min
CHL CUP STRESS STAGE 5 SBP: 193 mmHg
CHL CUP STRESS STAGE 5 SPEED: 2.5 mph
CHL CUP STRESS STAGE 6 GRADE: 14 %
CHL CUP STRESS STAGE 6 HR: 137 {beats}/min
CHL CUP STRESS STAGE 7 GRADE: 16 %
CHL CUP STRESS STAGE 7 SPEED: 4.2 mph
CHL CUP STRESS STAGE 8 DBP: 97 mmHg
CHL CUP STRESS STAGE 8 GRADE: 0 %
CHL CUP STRESS STAGE 8 HR: 131 {beats}/min
CHL CUP STRESS STAGE 9 DBP: 85 mmHg
CSEPED: 10 min
CSEPHR: 94 %
CSEPPHR: 150 {beats}/min
CSEPPMHR: 93 %
Estimated workload: 13.1 METS
Exercise duration (sec): 50 s
MPHR: 160 {beats}/min
RPE: 16
Stage 1 DBP: 94 mmHg
Stage 1 Speed: 0 mph
Stage 2 HR: 71 {beats}/min
Stage 3 Grade: 0.1 %
Stage 3 HR: 71 {beats}/min
Stage 3 Speed: 1 mph
Stage 4 DBP: 81 mmHg
Stage 4 SBP: 165 mmHg
Stage 6 DBP: 83 mmHg
Stage 6 SBP: 198 mmHg
Stage 6 Speed: 3.4 mph
Stage 7 HR: 150 {beats}/min
Stage 8 SBP: 180 mmHg
Stage 8 Speed: 0 mph
Stage 9 Grade: 0 %
Stage 9 HR: 90 {beats}/min
Stage 9 SBP: 154 mmHg
Stage 9 Speed: 0 mph

## 2015-08-29 ENCOUNTER — Telehealth: Payer: Self-pay | Admitting: Cardiology

## 2015-08-29 NOTE — Telephone Encounter (Signed)
F/u  Pt returning RN phone call concerning gxt results/ Please call back and discuss.

## 2015-08-29 NOTE — Telephone Encounter (Signed)
Reviewed results of GXT with pt who states understanding and was grateful for the return call and good news.

## 2015-09-05 ENCOUNTER — Encounter (INDEPENDENT_AMBULATORY_CARE_PROVIDER_SITE_OTHER): Payer: BLUE CROSS/BLUE SHIELD | Admitting: Ophthalmology

## 2015-09-05 DIAGNOSIS — H35033 Hypertensive retinopathy, bilateral: Secondary | ICD-10-CM | POA: Diagnosis not present

## 2015-09-05 DIAGNOSIS — H35722 Serous detachment of retinal pigment epithelium, left eye: Secondary | ICD-10-CM

## 2015-09-05 DIAGNOSIS — H35731 Hemorrhagic detachment of retinal pigment epithelium, right eye: Secondary | ICD-10-CM

## 2015-09-05 DIAGNOSIS — H43813 Vitreous degeneration, bilateral: Secondary | ICD-10-CM | POA: Diagnosis not present

## 2015-09-05 DIAGNOSIS — I10 Essential (primary) hypertension: Secondary | ICD-10-CM

## 2015-09-26 ENCOUNTER — Encounter (INDEPENDENT_AMBULATORY_CARE_PROVIDER_SITE_OTHER): Payer: BLUE CROSS/BLUE SHIELD | Admitting: Ophthalmology

## 2015-09-26 DIAGNOSIS — I10 Essential (primary) hypertension: Secondary | ICD-10-CM

## 2015-09-26 DIAGNOSIS — H35033 Hypertensive retinopathy, bilateral: Secondary | ICD-10-CM

## 2015-09-26 DIAGNOSIS — H35731 Hemorrhagic detachment of retinal pigment epithelium, right eye: Secondary | ICD-10-CM | POA: Diagnosis not present

## 2015-09-26 DIAGNOSIS — H35722 Serous detachment of retinal pigment epithelium, left eye: Secondary | ICD-10-CM | POA: Diagnosis not present

## 2015-10-13 ENCOUNTER — Encounter: Payer: Self-pay | Admitting: Family

## 2015-10-13 ENCOUNTER — Ambulatory Visit (INDEPENDENT_AMBULATORY_CARE_PROVIDER_SITE_OTHER): Payer: BLUE CROSS/BLUE SHIELD | Admitting: Family

## 2015-10-13 VITALS — BP 126/69 | HR 59 | Temp 98.4°F | Resp 18 | Ht 67.0 in | Wt 239.6 lb

## 2015-10-13 DIAGNOSIS — R739 Hyperglycemia, unspecified: Secondary | ICD-10-CM | POA: Diagnosis not present

## 2015-10-13 DIAGNOSIS — I10 Essential (primary) hypertension: Secondary | ICD-10-CM | POA: Diagnosis not present

## 2015-10-13 DIAGNOSIS — D649 Anemia, unspecified: Secondary | ICD-10-CM | POA: Diagnosis not present

## 2015-10-13 DIAGNOSIS — R51 Headache: Secondary | ICD-10-CM

## 2015-10-13 DIAGNOSIS — R519 Headache, unspecified: Secondary | ICD-10-CM

## 2015-10-13 NOTE — Assessment & Plan Note (Signed)
Obtain follow up A1c.  

## 2015-10-13 NOTE — Patient Instructions (Addendum)
Please complete lab work prior to leaving.   

## 2015-10-13 NOTE — Progress Notes (Signed)
Subjective:    [name], male    DOB: 06-Jun-1956, 60 y.o.   MRN: [id number]  HPI  Jeremy [name] is a 60 yr old male who presents today for follo wup.  1) HTN- maintained on lotrel, hctz.  BP Readings from Last 3 Encounters:  10/13/15 126/69  07/25/15 122/80  07/07/15 136/80   2) Anemia- reports that his last colo was <5 yrs ago and normal per patient. Patient reports that he had blood in his urine on the 10th. Dr. 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 Reports that this has been going on for "years." Usually on the right side, and sometimes occurs on the side of his head.  Occurs rarely.    Wt Readings from Last 3 Encounters:  10/13/15 239 lb 9.6 oz (108.682 kg)  07/25/15 230 lb 6 oz (104.497 kg)  07/07/15 229 lb 12.8 oz (104.237 kg)     Review of Systems    see HPI  Past Medical History  Diagnosis Date  . Hypertension   . Hyperlipemia   . Joint pain, knee   . GERD (gastroesophageal reflux disease)   . OSA (obstructive sleep apnea)     "dr took me off the mask; told me to lose weight" (06/30/2015)  . History of hiatal hernia   . Migraine     06/30/2015 "maybe 2-3 times/year; not as bad as I used to have them"  . Bulging lumbar disc   . Bladder disease     "an unusual bladder disease; don't know what it's called" (06/30/2015)  . Anemia 11/16/2014  . Hyperglycemia 11/16/2014    Social History   Social History  . Marital Status: Married    Spouse Name: N/A  . Number of Children: 3  . Years of Education: N/A   Occupational History  . Not on file.   Social History Main  Topics  . Smoking status: Former Smoker    Types: Cigarettes  . Smokeless tobacco: Never Used     Comment: "stopped smoking in ~ 1990"  . Alcohol Use: No  . Drug Use: No  . Sexual Activity: Yes   Other Topics Concern  . 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 Nasal septum surgery  ~ 1972  . Knee arthroscopy Right 1995  . Total knee arthroplasty  10/11/2011    Procedure: TOTAL KNEE ARTHROPLASTY;  Surgeon: Rudean Haskell, MD;  Location: [facility];  Service: Orthopedics;  Laterality: Right;  . Transurethral resection of bladder tumor with gyrus (turbt-gyrus)  ~2014  . Esophagogastroduodenoscopy    . Esophagogastroduodenoscopy (egd) with esophageal dilation  ~ 2014    Family History  Problem Relation Age of Onset  . Anesthesia problems Neg Hx   . Hypotension Neg Hx   . Malignant hyperthermia Neg Hx   . Pseudochol deficiency  Neg Hx   . Stroke Mother 7    Died of CVA  . Hypertension Mother     No Known Allergies  Current Outpatient Prescriptions on File Prior to Visit  Medication Sig Dispense Refill  . amLODipine-benazepril (LOTREL) 10-20 MG capsule Take 1 capsule by mouth daily. 90 capsule 1  . b complex vitamins tablet Take 1 tablet by mouth daily.    . diclofenac (VOLTAREN) 75 MG EC tablet Take 1 tablet (75 mg total) by mouth 2 (two) times daily. To be taken schedule for 5 days and then PRN for pain. 20 tablet 0  . fexofenadine (ALLEGRA) 180 MG tablet Take 180 mg by mouth daily.    . hydrochlorothiazide (MICROZIDE) 12.5 MG capsule Take 1 capsule (12.5 mg total) by mouth daily. 30 capsule 5  . Multiple Vitamin (MULTIVITAMIN WITH MINERALS) TABS tablet Take 1 tablet by mouth daily.    Marland Kitchen omeprazole (PRILOSEC) 40 MG capsule Take 1 capsule (40 mg total) by mouth 2 (two) times daily. 60 capsule 2  .  potassium chloride (K-DUR) 10 MEQ tablet Take 2 tablets (20 mEq total) by mouth daily. 60 tablet 5   No current facility-administered medications on file prior to visit.    BP 126/69 mmHg  Pulse 59  Temp(Src) 98.4 F (36.9 C) (Oral)  Resp 18  Ht 5\' 7"  (1.702 m)  Wt 239 lb 9.6 oz (108.682 kg)  BMI 37.52 kg/m2  SpO2 95%    Objective:   Physical Exam  Constitutional: He is oriented to person, place, and time. He appears well-developed and well-nourished. No distress.  HENT:  Head: Normocephalic and atraumatic.  Cardiovascular: Normal rate and regular rhythm.   No murmur heard. Pulmonary/Chest: Effort normal and breath sounds normal. No respiratory distress. He has no wheezes. He has no rales.  Musculoskeletal: He exhibits no edema.  Neurological: He is alert and oriented to person, place, and time.  Skin: Skin is warm and dry.  Psychiatric: He has a normal mood and affect. His behavior is normal. Thought content normal.          Assessment & Plan:  Headache- rare symptoms.  Monitor for now. Advised pt to call if worsening frequency/severity.

## 2015-10-13 NOTE — Assessment & Plan Note (Signed)
Stable on current meds. Continue same. Obtain bmet. 

## 2015-10-13 NOTE — Progress Notes (Signed)
Pre visit review using our clinic review tool, if applicable. No additional management support is needed unless otherwise documented below in the visit note. 

## 2015-10-13 NOTE — Assessment & Plan Note (Signed)
Has had negative IFOB.  I suspect he has some chronic microscopic loss through his urine due to his Chronic follicular cystitis.  I did advise him to let Dr. Venia Minks know about his episode of gross hematuria. Obtain follow up cbc.

## 2015-10-14 ENCOUNTER — Encounter: Payer: Self-pay | Admitting: Family

## 2015-10-14 LAB — CBC WITH DIFFERENTIAL/PLATELET
BASOS ABS: 0 10*3/uL (ref 0.0–0.1)
Basophils Relative: 0.5 % (ref 0.0–3.0)
EOS ABS: 0.4 10*3/uL (ref 0.0–0.7)
Eosinophils Relative: 4.6 % (ref 0.0–5.0)
HCT: 37.1 % — ABNORMAL LOW (ref 39.0–52.0)
Hemoglobin: 12.5 g/dL — ABNORMAL LOW (ref 13.0–17.0)
LYMPHS ABS: 2.4 10*3/uL (ref 0.7–4.0)
Lymphocytes Relative: 31.8 % (ref 12.0–46.0)
MCHC: 33.8 g/dL (ref 30.0–36.0)
MCV: 84.9 fl (ref 78.0–100.0)
MONO ABS: 0.5 10*3/uL (ref 0.1–1.0)
Monocytes Relative: 7.1 % (ref 3.0–12.0)
NEUTROS PCT: 56 % (ref 43.0–77.0)
Neutro Abs: 4.3 10*3/uL (ref 1.4–7.7)
Platelets: 262 10*3/uL (ref 150.0–400.0)
RBC: 4.37 Mil/uL (ref 4.22–5.81)
RDW: 14.2 % (ref 11.5–15.5)
WBC: 7.6 10*3/uL (ref 4.0–10.5)

## 2015-10-14 LAB — BASIC METABOLIC PANEL
BUN: 15 mg/dL (ref 6–23)
CHLORIDE: 105 meq/L (ref 96–112)
CO2: 29 mEq/L (ref 19–32)
CREATININE: 0.9 mg/dL (ref 0.40–1.50)
Calcium: 8.8 mg/dL (ref 8.4–10.5)
GFR: 91.22 mL/min (ref >60.00)
Glucose, Bld: 86 mg/dL (ref 70–99)
POTASSIUM: 3.6 meq/L (ref 3.5–5.1)
Sodium: 139 mEq/L (ref 135–145)

## 2015-10-14 LAB — HEMOGLOBIN A1C: HEMOGLOBIN A1C: 5.9 % (ref 4.6–6.5)

## 2015-11-07 ENCOUNTER — Ambulatory Visit (INDEPENDENT_AMBULATORY_CARE_PROVIDER_SITE_OTHER): Payer: BLUE CROSS/BLUE SHIELD | Admitting: Ophthalmology

## 2015-11-07 DIAGNOSIS — H35033 Hypertensive retinopathy, bilateral: Secondary | ICD-10-CM | POA: Diagnosis not present

## 2015-11-07 DIAGNOSIS — H35731 Hemorrhagic detachment of retinal pigment epithelium, right eye: Secondary | ICD-10-CM | POA: Diagnosis not present

## 2015-11-07 DIAGNOSIS — I10 Essential (primary) hypertension: Secondary | ICD-10-CM

## 2015-11-07 DIAGNOSIS — H35722 Serous detachment of retinal pigment epithelium, left eye: Secondary | ICD-10-CM | POA: Diagnosis not present

## 2015-11-07 DIAGNOSIS — H43813 Vitreous degeneration, bilateral: Secondary | ICD-10-CM

## 2015-11-27 DIAGNOSIS — Z471 Aftercare following joint replacement surgery: Secondary | ICD-10-CM | POA: Diagnosis not present

## 2015-11-27 DIAGNOSIS — Z96651 Presence of right artificial knee joint: Secondary | ICD-10-CM | POA: Diagnosis not present

## 2015-12-19 ENCOUNTER — Telehealth: Payer: Self-pay | Admitting: *Deleted

## 2015-12-19 MED ORDER — AMLODIPINE BESY-BENAZEPRIL HCL 10-20 MG PO CAPS: 1.0000 | ORAL_CAPSULE | Freq: Every day | ORAL | Status: DC

## 2015-12-19 MED ORDER — POTASSIUM CHLORIDE ER 10 MEQ PO TBCR: 20.0000 meq | EXTENDED_RELEASE_TABLET | Freq: Every day | ORAL | Status: DC

## 2015-12-19 NOTE — Telephone Encounter (Signed)
Received call from pt's daughter requesting refills on pt's lotrel and potassium. Refills sent. Pt's daughter made aware.

## 2016-01-05 ENCOUNTER — Ambulatory Visit: Payer: BLUE CROSS/BLUE SHIELD | Admitting: Family

## 2016-01-08 ENCOUNTER — Telehealth: Payer: Self-pay | Admitting: Family

## 2016-01-08 MED ORDER — HYDROCHLOROTHIAZIDE 12.5 MG PO CAPS: 12.5000 mg | ORAL_CAPSULE | Freq: Every day | ORAL | Status: DC

## 2016-01-08 NOTE — Telephone Encounter (Signed)
Spoke with Pt's wife and she voices understanding that his medication has been sent to the pharmacy.

## 2016-01-08 NOTE — Telephone Encounter (Signed)
Caller name: Danise Mina Relation to pt: daughter  Call back number: [phone]  Pharmacy: [id number] - [place], [address] [place] [phone] (Phone) [phone] (Fax)         Reason for call:  Daughter requesting a refill hydrochlorothiazide (MICROZIDE) 12.5 MG capsule

## 2016-01-12 ENCOUNTER — Encounter: Payer: Self-pay | Admitting: Family

## 2016-01-12 ENCOUNTER — Ambulatory Visit (INDEPENDENT_AMBULATORY_CARE_PROVIDER_SITE_OTHER): Payer: BLUE CROSS/BLUE SHIELD | Admitting: Family

## 2016-01-12 VITALS — BP 135/81 | HR 65 | Temp 98.2°F | Resp 18 | Ht 67.0 in | Wt 243.2 lb

## 2016-01-12 DIAGNOSIS — E039 Hypothyroidism, unspecified: Secondary | ICD-10-CM | POA: Diagnosis not present

## 2016-01-12 DIAGNOSIS — Z114 Encounter for screening for human immunodeficiency virus [HIV]: Secondary | ICD-10-CM

## 2016-01-12 DIAGNOSIS — Z1159 Encounter for screening for other viral diseases: Secondary | ICD-10-CM | POA: Diagnosis not present

## 2016-01-12 DIAGNOSIS — K219 Gastro-esophageal reflux disease without esophagitis: Secondary | ICD-10-CM

## 2016-01-12 DIAGNOSIS — Z0000 Encounter for general adult medical examination without abnormal findings: Secondary | ICD-10-CM | POA: Diagnosis not present

## 2016-01-12 DIAGNOSIS — D649 Anemia, unspecified: Secondary | ICD-10-CM | POA: Diagnosis not present

## 2016-01-12 DIAGNOSIS — R739 Hyperglycemia, unspecified: Secondary | ICD-10-CM

## 2016-01-12 MED ORDER — OMEPRAZOLE 40 MG PO CPDR: 40.0000 mg | DELAYED_RELEASE_CAPSULE | Freq: Every day | ORAL | Status: DC

## 2016-01-12 NOTE — Assessment & Plan Note (Signed)
Stable on once daily PPI. Continue once daily.

## 2016-01-12 NOTE — Assessment & Plan Note (Signed)
Obtain A1C. We discussed weight gain and importance of weight loss.

## 2016-01-12 NOTE — Patient Instructions (Signed)
Please complete lab work prior to leaving.   

## 2016-01-12 NOTE — Progress Notes (Signed)
Subjective:    [name], male    DOB: 12-31-1955, 60 y.o.   MRN: [id number]  HPI  Jeremy [name] is a 60 yr old male who presents today for follow up.  1) HTN- current bp meds include hctz 12.5, and lotrel 10-20.  BP Readings from Last 3 Encounters:  01/12/16 135/81  10/13/15 126/69  07/25/15 122/80   2) Hyperglycemia-  Lab Results  Component Value Date   HGBA1C 5.9 10/13/2015   3) gerd- maintained on prilosec. Reports taking prilosec 1x daily not bid. Denies gerd symptoms.   4) Anemia- has seen hematology, Dr. Tye Savoy in the remote past. Was given IV iron infusion. Notes feeling "sluggish" Taking a MVI daily.   Wt Readings from Last 3 Encounters:  01/12/16 243 lb 3.2 oz (110.315 kg)  10/13/15 239 lb 9.6 oz (108.682 kg)  07/25/15 230 lb 6 oz (104.497 kg)    Review of Systems    see HPI  Past Medical History  Diagnosis Date  . Hypertension   . Hyperlipemia   . Joint pain, knee   . GERD (gastroesophageal reflux disease)   . OSA (obstructive sleep apnea)     "dr took me off the mask; told me to lose weight" (06/30/2015)  . History of hiatal hernia   . Migraine     06/30/2015 "maybe 2-3 times/year; not as bad as I used to have them"  . Bulging lumbar disc   . Bladder disease     "an unusual bladder disease; don't know what it's called" (06/30/2015)  . Anemia 11/16/2014  . Hyperglycemia 11/16/2014     Social History   Social History  . Marital Status: Married    Spouse Name: N/A  . Number of Children: 3  . Years of Education: N/A   Occupational History  . Not on file.   Social History Main Topics  . Smoking status: Former Smoker    Types: Cigarettes  . Smokeless tobacco: Never Used     Comment: "stopped smoking in ~ 1990"  . Alcohol Use: No  . Drug Use: No  . Sexual Activity: Yes   Other Topics Concern  . 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 Nasal septum surgery  ~ 1972  . Knee arthroscopy Right 1995  . Total knee arthroplasty  10/11/2011    Procedure: TOTAL KNEE ARTHROPLASTY;  Surgeon: Rudean Haskell, MD;  Location: [facility];  Service: Orthopedics;  Laterality: Right;  . Transurethral resection of bladder tumor with gyrus (turbt-gyrus)  ~2014  . Esophagogastroduodenoscopy    . Esophagogastroduodenoscopy (egd) with esophageal dilation  ~ 2014    Family History  Problem Relation Age of Onset  . Anesthesia problems Neg Hx   . Hypotension Neg Hx   . Malignant hyperthermia Neg Hx   . Pseudochol deficiency Neg Hx   . Stroke Mother 52    Died of CVA  . Hypertension Mother     No Known Allergies  Current Outpatient Prescriptions on File Prior to Visit  Medication Sig Dispense Refill  . amLODipine-benazepril (LOTREL) 10-20 MG capsule Take 1 capsule by mouth daily. 90 capsule 1  . b complex vitamins tablet Take 1 tablet by mouth daily.    . fexofenadine (ALLEGRA) 180  MG tablet Take 180 mg by mouth daily.    . hydrochlorothiazide (MICROZIDE) 12.5 MG capsule Take 1 capsule (12.5 mg total) by mouth daily. 30 capsule 5  . Multiple Vitamin (MULTIVITAMIN WITH MINERALS) TABS tablet Take 1 tablet by mouth daily.    Marland Kitchen omeprazole (PRILOSEC) 40 MG capsule Take 1 capsule (40 mg total) by mouth 2 (two) times daily. 60 capsule 2  . potassium chloride (K-DUR) 10 MEQ tablet Take 2 tablets (20 mEq total) by mouth daily. 60 tablet 5   No current facility-administered medications on file prior to visit.    BP 135/81 mmHg  Pulse 65  Temp(Src) 98.2 F (36.8 C) (Oral)  Resp 18  Ht 5\' 7"  (1.702 m)  Wt 243 lb 3.2 oz (110.315 kg)  BMI 38.08 kg/m2  SpO2 99%    Objective:   Physical Exam  Constitutional: He is oriented to person, place, and time. He appears well-developed and well-nourished. No distress.  HENT:  Head: Normocephalic and  atraumatic.  Cardiovascular: Normal rate and regular rhythm.   No murmur heard. Pulmonary/Chest: Effort normal and breath sounds normal. No respiratory distress. He has no wheezes. He has no rales.  Musculoskeletal: He exhibits no edema.  Slight swelling of right knee and RLE noted  Neurological: He is alert and oriented to person, place, and time.  Skin: Skin is warm and dry.  Psychiatric: He has a normal mood and affect. His behavior is normal. Thought content normal.          Assessment & Plan:

## 2016-01-12 NOTE — Assessment & Plan Note (Signed)
Obtain cbc, anemia studies.

## 2016-01-12 NOTE — Progress Notes (Signed)
Pre visit review using our clinic review tool, if applicable. No additional management support is needed unless otherwise documented below in the visit note. 

## 2016-01-13 ENCOUNTER — Telehealth: Payer: Self-pay | Admitting: Family

## 2016-01-13 LAB — CBC WITH DIFFERENTIAL/PLATELET
BASOS PCT: 0.5 % (ref 0.0–3.0)
Basophils Absolute: 0 10*3/uL (ref 0.0–0.1)
EOS PCT: 2.8 % (ref 0.0–5.0)
Eosinophils Absolute: 0.2 10*3/uL (ref 0.0–0.7)
HCT: 40.6 % (ref 39.0–52.0)
Hemoglobin: 13.7 g/dL (ref 13.0–17.0)
LYMPHS ABS: 2.2 10*3/uL (ref 0.7–4.0)
Lymphocytes Relative: 26.3 % (ref 12.0–46.0)
MCHC: 33.8 g/dL (ref 30.0–36.0)
MCV: 86.8 fl (ref 78.0–100.0)
MONO ABS: 0.7 10*3/uL (ref 0.1–1.0)
MONOS PCT: 8 % (ref 3.0–12.0)
NEUTROS PCT: 62.4 % (ref 43.0–77.0)
Neutro Abs: 5.2 10*3/uL (ref 1.4–7.7)
Platelets: 255 10*3/uL (ref 150.0–400.0)
RBC: 4.68 Mil/uL (ref 4.22–5.81)
RDW: 14.4 % (ref 11.5–15.5)
WBC: 8.3 10*3/uL (ref 4.0–10.5)

## 2016-01-13 LAB — FOLATE

## 2016-01-13 LAB — BASIC METABOLIC PANEL
BUN: 16 mg/dL (ref 6–23)
CHLORIDE: 105 meq/L (ref 96–112)
CO2: 30 mEq/L (ref 19–32)
Calcium: 9 mg/dL (ref 8.4–10.5)
Creatinine, Ser: 0.9 mg/dL (ref 0.40–1.50)
GFR: 91.14 mL/min (ref >60.00)
Glucose, Bld: 85 mg/dL (ref 70–99)
POTASSIUM: 4 meq/L (ref 3.5–5.1)
Sodium: 140 mEq/L (ref 135–145)

## 2016-01-13 LAB — VITAMIN B12: Vitamin B-12: 714 pg/mL (ref 211–911)

## 2016-01-13 LAB — TSH: TSH: 4.21 u[IU]/mL (ref 0.35–4.50)

## 2016-01-13 LAB — HEPATITIS C ANTIBODY: HCV AB: NEGATIVE

## 2016-01-13 LAB — FERRITIN: Ferritin: 12.2 ng/mL — ABNORMAL LOW (ref 22.0–322.0)

## 2016-01-13 LAB — HEMOGLOBIN A1C: HEMOGLOBIN A1C: 5.8 % (ref 4.6–6.5)

## 2016-01-13 LAB — HIV ANTIBODY (ROUTINE TESTING W REFLEX): HIV: NONREACTIVE

## 2016-01-13 LAB — IRON: IRON: 37 ug/dL — AB (ref 42–165)

## 2016-01-13 NOTE — Telephone Encounter (Signed)
Iron is low. Advise pt add iron 325mg  twice daily, repeat ferritin and cbc in 2 months.  Sugar looks good. hiv and hep c screen is negative.

## 2016-01-14 NOTE — Telephone Encounter (Signed)
Notified pt and he voices understanding. He has a follow up with PCP on 04/19/16 and wishes to wait until then to repeat labs.

## 2016-01-23 ENCOUNTER — Other Ambulatory Visit: Payer: Self-pay | Admitting: *Deleted

## 2016-01-23 MED ORDER — FERROUS SULFATE 325 (65 FE) MG PO TABS: 325.0000 mg | ORAL_TABLET | Freq: Two times a day (BID) | ORAL | Status: DC

## 2016-03-02 ENCOUNTER — Other Ambulatory Visit: Payer: Self-pay | Admitting: Family

## 2016-03-02 MED ORDER — HYDROCHLOROTHIAZIDE 12.5 MG PO CAPS: 12.5000 mg | ORAL_CAPSULE | Freq: Every day | ORAL | Status: DC

## 2016-03-02 MED ORDER — AMLODIPINE BESY-BENAZEPRIL HCL 10-20 MG PO CAPS: 1.0000 | ORAL_CAPSULE | Freq: Every day | ORAL | Status: DC

## 2016-03-02 MED ORDER — POTASSIUM CHLORIDE ER 10 MEQ PO TBCR: 20.0000 meq | EXTENDED_RELEASE_TABLET | Freq: Every day | ORAL | Status: DC

## 2016-03-02 MED ORDER — OMEPRAZOLE 40 MG PO CPDR: 40.0000 mg | DELAYED_RELEASE_CAPSULE | Freq: Every day | ORAL | Status: DC

## 2016-03-02 NOTE — Telephone Encounter (Signed)
Rx's sent to [facility] by e-script.//AB/CMA

## 2016-03-02 NOTE — Telephone Encounter (Signed)
Pharmacy: [facility] - 3 month/90 day supply  Reason for call: Insurance is requiring change to [facility] for maintenance medications. Please send for 3 month/90 day supply on Almodipine, Hydrochlorothiazide, Omeprazole, and Potassium Chloride.

## 2016-03-12 ENCOUNTER — Ambulatory Visit (INDEPENDENT_AMBULATORY_CARE_PROVIDER_SITE_OTHER): Payer: BLUE CROSS/BLUE SHIELD | Admitting: Ophthalmology

## 2016-03-12 DIAGNOSIS — I10 Essential (primary) hypertension: Secondary | ICD-10-CM | POA: Diagnosis not present

## 2016-03-12 DIAGNOSIS — H35731 Hemorrhagic detachment of retinal pigment epithelium, right eye: Secondary | ICD-10-CM | POA: Diagnosis not present

## 2016-03-12 DIAGNOSIS — H35033 Hypertensive retinopathy, bilateral: Secondary | ICD-10-CM

## 2016-03-12 DIAGNOSIS — H35722 Serous detachment of retinal pigment epithelium, left eye: Secondary | ICD-10-CM | POA: Diagnosis not present

## 2016-03-12 DIAGNOSIS — H43813 Vitreous degeneration, bilateral: Secondary | ICD-10-CM | POA: Diagnosis not present

## 2016-04-05 ENCOUNTER — Ambulatory Visit: Payer: BLUE CROSS/BLUE SHIELD | Admitting: Family

## 2016-04-05 ENCOUNTER — Ambulatory Visit (INDEPENDENT_AMBULATORY_CARE_PROVIDER_SITE_OTHER): Payer: BLUE CROSS/BLUE SHIELD | Admitting: Family

## 2016-04-05 ENCOUNTER — Telehealth: Payer: Self-pay | Admitting: *Deleted

## 2016-04-05 ENCOUNTER — Encounter: Payer: Self-pay | Admitting: Family

## 2016-04-05 DIAGNOSIS — I5032 Chronic diastolic (congestive) heart failure: Secondary | ICD-10-CM | POA: Diagnosis not present

## 2016-04-05 NOTE — Progress Notes (Signed)
Pre visit review using our clinic review tool, if applicable. No additional management support is needed unless otherwise documented below in the visit note. 

## 2016-04-05 NOTE — Patient Instructions (Addendum)
You are cleared to use your respirator.  Please follow up as scheduled.

## 2016-04-05 NOTE — Progress Notes (Signed)
Subjective:    [name], male    DOB: August 05, 1956, 60 y.o.   MRN: [id number]  HPI  Jeremy [name] is a 60 yr old male who presents today for a form fill from his work.  He is required to use a respirator for his job and needs medical clearance.   He denies CP. Reports some sob with exertion, however this is not new.    Review of Systems Past Medical History:  Diagnosis Date  . Anemia 11/16/2014  . Bladder disease    "an unusual bladder disease; don't know what it's called" (06/30/2015)  . Bulging lumbar disc   . GERD (gastroesophageal reflux disease)   . History of hiatal hernia   . Hyperglycemia 11/16/2014  . Hyperlipemia   . Hypertension   . Joint pain, knee   . Migraine    06/30/2015 "maybe 2-3 times/year; not as bad as I used to have them"  . OSA (obstructive sleep apnea)    "dr took me off the mask; told me to lose weight" (06/30/2015)     Social History   Social History  . Marital status: Married    Spouse name: N/A  . Number of children: 3  . Years of education: N/A   Occupational History  . Not on file.   Social History Main Topics  . Smoking status: Former Smoker    Types: Cigarettes  . Smokeless tobacco: Never Used     Comment: "stopped smoking in ~ 1990"  . Alcohol use No  . Drug use: No  . Sexual activity: Yes   Other Topics Concern  . 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 ESOPHAGOGASTRODUODENOSCOPY    . ESOPHAGOGASTRODUODENOSCOPY (EGD) WITH ESOPHAGEAL DILATION  ~ 2014  . KNEE ARTHROSCOPY Right 1995  . NASAL SEPTUM SURGERY  ~ 1972  . TOTAL KNEE ARTHROPLASTY  10/11/2011   Procedure: TOTAL KNEE ARTHROPLASTY;  Surgeon: Rudean Haskell, MD;  Location: [facility];  Service: Orthopedics;  Laterality: Right;  . 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  (TURBT-GYRUS)  ~2014    Family History  Problem Relation Age of Onset  . Anesthesia problems Neg Hx   . Hypotension Neg Hx   . Malignant hyperthermia Neg Hx   . Pseudochol deficiency Neg Hx   . Stroke Mother 41    Died of CVA  . Hypertension Mother     No Known Allergies  Current Outpatient Prescriptions on File Prior to Visit  Medication Sig Dispense Refill  . amLODipine-benazepril (LOTREL) 10-20 MG capsule Take 1 capsule by mouth daily. 90 capsule 1  . b complex vitamins tablet Take 1 tablet by mouth daily.    . ferrous sulfate 325 (65 FE) MG tablet Take 1 tablet (325 mg total) by mouth 2 (two) times daily with a meal. 60 tablet 0  . fexofenadine (ALLEGRA) 180 MG tablet Take 180 mg by mouth daily.    . hydrochlorothiazide (MICROZIDE) 12.5 MG capsule Take 1 capsule (12.5 mg total) by mouth daily. 90 capsule 1  . Multiple Vitamin (MULTIVITAMIN WITH MINERALS) TABS tablet Take 1 tablet by mouth daily.    Marland Kitchen omeprazole (PRILOSEC) 40 MG capsule Take 1 capsule (40 mg total) by mouth daily. 90 capsule 1  .  potassium chloride (K-DUR) 10 MEQ tablet Take 2 tablets (20 mEq total) by mouth daily. 180 tablet 1   No current facility-administered medications on file prior to visit.     BP 138/90   Pulse 73   Temp 98.1 F (36.7 C) (Oral)   Resp 18   Ht 5\' 7"  (1.702 m)   Wt 248 lb 12.8 oz (112.9 kg)   SpO2 97% Comment: room air  BMI 38.97 kg/m       Objective:   Physical Exam  Constitutional: He is oriented to person, place, and time. He appears well-developed and well-nourished. No distress.  HENT:  Head: Normocephalic and atraumatic.  Cardiovascular: Normal rate and regular rhythm.   No murmur heard. Pulmonary/Chest: Effort normal and breath sounds normal. No respiratory distress. He has no wheezes. He has no rales.  Musculoskeletal:  Some swelling of the right lower extremity.   Neurological: He is alert and oriented to person, place, and time.  Skin: Skin is warm and dry.    Psychiatric: He has a normal mood and affect. His behavior is normal. Thought content normal.          Assessment & Plan:

## 2016-04-05 NOTE — Assessment & Plan Note (Signed)
Clinically stable.  Had normal ETT performed by cardiology. No medical contraindication to using respirator mask for his job.

## 2016-04-05 NOTE — Telephone Encounter (Signed)
Pt brought form to appt today for Medical evaluation regarding Respirator use/restrictions. Form completed and faxed to [facility] at [phone].

## 2016-04-19 ENCOUNTER — Ambulatory Visit: Payer: BLUE CROSS/BLUE SHIELD | Admitting: Family

## 2016-05-03 ENCOUNTER — Encounter: Payer: Self-pay | Admitting: Family

## 2016-05-03 ENCOUNTER — Ambulatory Visit (INDEPENDENT_AMBULATORY_CARE_PROVIDER_SITE_OTHER): Payer: BLUE CROSS/BLUE SHIELD | Admitting: Family

## 2016-05-03 VITALS — BP 139/77 | HR 71 | Temp 97.9°F | Resp 18 | Ht 67.0 in | Wt 251.0 lb

## 2016-05-03 DIAGNOSIS — E785 Hyperlipidemia, unspecified: Secondary | ICD-10-CM | POA: Diagnosis not present

## 2016-05-03 DIAGNOSIS — R739 Hyperglycemia, unspecified: Secondary | ICD-10-CM | POA: Diagnosis not present

## 2016-05-03 DIAGNOSIS — Z23 Encounter for immunization: Secondary | ICD-10-CM

## 2016-05-03 DIAGNOSIS — F418 Other specified anxiety disorders: Secondary | ICD-10-CM

## 2016-05-03 DIAGNOSIS — I10 Essential (primary) hypertension: Secondary | ICD-10-CM

## 2016-05-03 DIAGNOSIS — D509 Iron deficiency anemia, unspecified: Secondary | ICD-10-CM | POA: Diagnosis not present

## 2016-05-03 DIAGNOSIS — R635 Abnormal weight gain: Secondary | ICD-10-CM

## 2016-05-03 DIAGNOSIS — F329 Major depressive disorder, single episode, unspecified: Secondary | ICD-10-CM

## 2016-05-03 DIAGNOSIS — F32A Depression, unspecified: Secondary | ICD-10-CM

## 2016-05-03 DIAGNOSIS — F419 Anxiety disorder, unspecified: Secondary | ICD-10-CM

## 2016-05-03 NOTE — Assessment & Plan Note (Signed)
Clinically stable, obtain a1c.  

## 2016-05-03 NOTE — Assessment & Plan Note (Signed)
He is having a lot of work stress. I did have him fill out a PHQ-9 and he scored a 5 (minimal symptoms). I really think he will feel better when he is able to get away from his current job.

## 2016-05-03 NOTE — Patient Instructions (Addendum)
Please complete lab work prior to leaving. Try to add in some regular exercise and watch your diet.

## 2016-05-03 NOTE — Assessment & Plan Note (Signed)
Clinically stable. Obtain cbc.

## 2016-05-03 NOTE — Progress Notes (Signed)
Pre visit review using our clinic review tool, if applicable. No additional management support is needed unless otherwise documented below in the visit note. 

## 2016-05-03 NOTE — Assessment & Plan Note (Signed)
Stable on current mediations, continue same.

## 2016-05-03 NOTE — Assessment & Plan Note (Signed)
Stable with diet.  Monitor.

## 2016-05-03 NOTE — Progress Notes (Signed)
Subjective:    [name], male    DOB: [birth year], 60 y.o.   MRN: [id number]  HPI  Jeremy [name] is a 60 yr old male who presents today for follow up.  1) Anemia- has seen hematology in the remote past. He is maintained on oral iron supplements.   Lab Results  Component Value Date   WBC 8.3 01/12/2016   HGB 13.7 01/12/2016   HCT 40.6 01/12/2016   MCV 86.8 01/12/2016   PLT 255.0 01/12/2016   2) HTN- maintained on hctz and lotrel.   BP Readings from Last 3 Encounters:  05/03/16 139/77  04/05/16 138/90  01/12/16 135/81   3) Hyperlipidemia- Diet controlled.  Lab Results  Component Value Date   CHOL 200 06/30/2015   HDL 43 06/30/2015   LDLCALC 139 (H) 06/30/2015   TRIG 91 06/30/2015   CHOLHDL 4.7 06/30/2015   4) Hyperglycemia-  Lab Results  Component Value Date   HGBA1C 5.8 01/12/2016   Weight gain- not exercising.   Wt Readings from Last 3 Encounters:  05/03/16 251 lb (113.9 kg)  04/05/16 248 lb 12.8 oz (112.9 kg)  01/12/16 243 lb 3.2 oz (110.3 kg)   Reports that he feels overworked. He reports that his mood is bad.  Hates his work, people are "mean".  Working 6 days a week.  Does not work Sunday.  Mood is pretty good on Sunday when he is away from work.   Review of Systems     Past Medical History:  Diagnosis Date  . Anemia 11/16/2014  . Bladder disease    "an unusual bladder disease; don't know what it's called" (06/30/2015)  . Bulging lumbar disc   . GERD (gastroesophageal reflux disease)   . History of hiatal hernia   . Hyperglycemia 11/16/2014  . Hyperlipemia   . Hypertension   . Joint pain, knee   . Migraine    06/30/2015 "maybe 2-3 times/year; not as bad as I used to have them"  . OSA (obstructive sleep apnea)    "dr took me off the mask; told me to lose weight" (06/30/2015)     Social History   Social History  . Marital status: Married    Spouse name: N/A  . Number of children: 3  . Years of education: N/A   Occupational  History  . Not on file.   Social History Main Topics  . Smoking status: Former Smoker    Types: Cigarettes  . Smokeless tobacco: Never Used     Comment: "stopped smoking in ~ 1990"  . Alcohol use No  . Drug use: No  . Sexual activity: Yes   Other Topics Concern  . 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 ESOPHAGOGASTRODUODENOSCOPY    . ESOPHAGOGASTRODUODENOSCOPY (EGD) WITH ESOPHAGEAL DILATION  ~ 2014  . KNEE ARTHROSCOPY Right 1995  . NASAL SEPTUM SURGERY  ~ 1972  . TOTAL KNEE ARTHROPLASTY  10/11/2011   Procedure: TOTAL KNEE ARTHROPLASTY;  Surgeon: Rudean Haskell, MD;  Location: [facility];  Service: Orthopedics;  Laterality: Right;  . 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 Anesthesia problems Neg Hx   . Hypotension  Neg Hx   . Malignant hyperthermia Neg Hx   . Pseudochol deficiency Neg Hx   . Stroke Mother 33    Died of CVA  . Hypertension Mother     No Known Allergies  Current Outpatient Prescriptions on File Prior to Visit  Medication Sig Dispense Refill  . amLODipine-benazepril (LOTREL) 10-20 MG capsule Take 1 capsule by mouth daily. 90 capsule 1  . b complex vitamins tablet Take 1 tablet by mouth daily.    . ferrous sulfate 325 (65 FE) MG tablet Take 1 tablet (325 mg total) by mouth 2 (two) times daily with a meal. 60 tablet 0  . fexofenadine (ALLEGRA) 180 MG tablet Take 180 mg by mouth daily.    . hydrochlorothiazide (MICROZIDE) 12.5 MG capsule Take 1 capsule (12.5 mg total) by mouth daily. 90 capsule 1  . Multiple Vitamin (MULTIVITAMIN WITH MINERALS) TABS tablet Take 1 tablet by mouth daily.    Marland Kitchen omeprazole (PRILOSEC) 40 MG capsule Take 1 capsule (40 mg total) by mouth daily. 90 capsule 1  . potassium  chloride (K-DUR) 10 MEQ tablet Take 2 tablets (20 mEq total) by mouth daily. 180 tablet 1   No current facility-administered medications on file prior to visit.     BP 139/77 (BP Location: Right Arm, Cuff Size: Large)   Pulse 71   Temp 97.9 F (36.6 C) (Oral)   Resp 18   Ht 5\' 7"  (1.702 m)   Wt 251 lb (113.9 kg)   SpO2 97% Comment: room air  BMI 39.31 kg/m    Objective:   Physical Exam  Constitutional: He is oriented to person, place, and time. He appears well-developed and well-nourished. No distress.  HENT:  Head: Normocephalic and atraumatic.  Cardiovascular: Normal rate and regular rhythm.   No murmur heard. Pulmonary/Chest: Effort normal and breath sounds normal. No respiratory distress. He has no wheezes. He has no rales.  Musculoskeletal: He exhibits no edema.  Neurological: He is alert and oriented to person, place, and time.  Skin: Skin is warm and dry.  Psychiatric: He has a normal mood and affect. His behavior is normal. Thought content normal.          Assessment & Plan:  Flu shot today.   Weight gain- obtain TSH.

## 2016-05-04 LAB — HEMOGLOBIN A1C: Hgb A1c MFr Bld: 5.6 % (ref 4.6–6.5)

## 2016-05-04 LAB — BASIC METABOLIC PANEL
BUN: 12 mg/dL (ref 6–23)
CALCIUM: 8.7 mg/dL (ref 8.4–10.5)
CHLORIDE: 104 meq/L (ref 96–112)
CO2: 31 meq/L (ref 19–32)
CREATININE: 0.81 mg/dL (ref 0.40–1.50)
GFR: 102.82 mL/min (ref >60.00)
Glucose, Bld: 90 mg/dL (ref 70–99)
Potassium: 3.6 mEq/L (ref 3.5–5.1)
Sodium: 140 mEq/L (ref 135–145)

## 2016-05-04 LAB — CBC WITH DIFFERENTIAL/PLATELET
BASOS ABS: 0 10*3/uL (ref 0.0–0.1)
Basophils Relative: 0.5 % (ref 0.0–3.0)
EOS PCT: 1.9 % (ref 0.0–5.0)
Eosinophils Absolute: 0.2 10*3/uL (ref 0.0–0.7)
HEMATOCRIT: 40 % (ref 39.0–52.0)
Hemoglobin: 14 g/dL (ref 13.0–17.0)
LYMPHS ABS: 2.7 10*3/uL (ref 0.7–4.0)
LYMPHS PCT: 31.9 % (ref 12.0–46.0)
MCHC: 35 g/dL (ref 30.0–36.0)
MCV: 88.5 fl (ref 78.0–100.0)
MONOS PCT: 9.3 % (ref 3.0–12.0)
Monocytes Absolute: 0.8 10*3/uL (ref 0.1–1.0)
NEUTROS ABS: 4.7 10*3/uL (ref 1.4–7.7)
NEUTROS PCT: 56.4 % (ref 43.0–77.0)
PLATELETS: 242 10*3/uL (ref 150.0–400.0)
RBC: 4.52 Mil/uL (ref 4.22–5.81)
RDW: 13.8 % (ref 11.5–15.5)
WBC: 8.4 10*3/uL (ref 4.0–10.5)

## 2016-05-04 LAB — TSH: TSH: 3.61 u[IU]/mL (ref 0.35–4.50)

## 2016-05-05 ENCOUNTER — Encounter: Payer: Self-pay | Admitting: Family

## 2016-05-13 DIAGNOSIS — K3189 Other diseases of stomach and duodenum: Secondary | ICD-10-CM | POA: Diagnosis not present

## 2016-05-13 DIAGNOSIS — K921 Melena: Secondary | ICD-10-CM | POA: Diagnosis not present

## 2016-05-13 DIAGNOSIS — R1084 Generalized abdominal pain: Secondary | ICD-10-CM | POA: Diagnosis not present

## 2016-05-13 DIAGNOSIS — Z86010 Personal history of colonic polyps: Secondary | ICD-10-CM | POA: Diagnosis not present

## 2016-05-13 DIAGNOSIS — K219 Gastro-esophageal reflux disease without esophagitis: Secondary | ICD-10-CM | POA: Diagnosis not present

## 2016-05-13 DIAGNOSIS — K621 Rectal polyp: Secondary | ICD-10-CM | POA: Insufficient documentation

## 2016-06-15 ENCOUNTER — Telehealth: Payer: Self-pay | Admitting: Family

## 2016-06-15 NOTE — Telephone Encounter (Signed)
Pt called to refill omeprazole with [facility]. He was told he can only get 90 doses per 150 days and was told to contact his PCP office. RX is written for 1 day, 3 month supply, disp #90 with 1 refill from July 2017. Please f/u with [facility] and notify pt.  Ph# [phone]

## 2016-06-16 NOTE — Telephone Encounter (Signed)
Spoke with Ubaldo Glassing at [facility] and received approval for Omeprazole 40mg  once daily, 90 for 90. Effective dates 05/26/16 through 12/13/16, Case # [id number].  Left detailed message on pt's cell# to call [facility] for remaining refill and let us know if he has any further problems.

## 2016-08-03 DIAGNOSIS — I700 Atherosclerosis of aorta: Secondary | ICD-10-CM | POA: Diagnosis not present

## 2016-08-03 DIAGNOSIS — N3030 Trigonitis without hematuria: Secondary | ICD-10-CM | POA: Diagnosis not present

## 2016-08-03 DIAGNOSIS — R42 Dizziness and giddiness: Secondary | ICD-10-CM | POA: Diagnosis not present

## 2016-08-03 DIAGNOSIS — F17211 Nicotine dependence, cigarettes, in remission: Secondary | ICD-10-CM | POA: Diagnosis not present

## 2016-08-03 DIAGNOSIS — R0602 Shortness of breath: Secondary | ICD-10-CM | POA: Diagnosis not present

## 2016-08-03 DIAGNOSIS — Z87891 Personal history of nicotine dependence: Secondary | ICD-10-CM | POA: Diagnosis not present

## 2016-08-03 DIAGNOSIS — R079 Chest pain, unspecified: Secondary | ICD-10-CM | POA: Diagnosis not present

## 2016-08-03 DIAGNOSIS — I10 Essential (primary) hypertension: Secondary | ICD-10-CM | POA: Diagnosis not present

## 2016-08-03 DIAGNOSIS — K219 Gastro-esophageal reflux disease without esophagitis: Secondary | ICD-10-CM | POA: Diagnosis not present

## 2016-08-03 DIAGNOSIS — R071 Chest pain on breathing: Secondary | ICD-10-CM | POA: Diagnosis not present

## 2016-08-03 DIAGNOSIS — E7800 Pure hypercholesterolemia, unspecified: Secondary | ICD-10-CM | POA: Diagnosis not present

## 2016-08-03 DIAGNOSIS — N359 Urethral stricture, unspecified: Secondary | ICD-10-CM | POA: Diagnosis not present

## 2016-08-03 DIAGNOSIS — R200 Anesthesia of skin: Secondary | ICD-10-CM | POA: Diagnosis not present

## 2016-08-03 DIAGNOSIS — R0789 Other chest pain: Secondary | ICD-10-CM | POA: Diagnosis not present

## 2016-08-03 DIAGNOSIS — I119 Hypertensive heart disease without heart failure: Secondary | ICD-10-CM | POA: Diagnosis not present

## 2016-08-03 DIAGNOSIS — Z79899 Other long term (current) drug therapy: Secondary | ICD-10-CM | POA: Diagnosis not present

## 2016-08-04 DIAGNOSIS — R110 Nausea: Secondary | ICD-10-CM | POA: Diagnosis not present

## 2016-08-04 DIAGNOSIS — R42 Dizziness and giddiness: Secondary | ICD-10-CM | POA: Diagnosis not present

## 2016-08-04 DIAGNOSIS — I10 Essential (primary) hypertension: Secondary | ICD-10-CM | POA: Diagnosis not present

## 2016-08-04 DIAGNOSIS — E7800 Pure hypercholesterolemia, unspecified: Secondary | ICD-10-CM | POA: Diagnosis not present

## 2016-08-04 DIAGNOSIS — Z6841 Body Mass Index (BMI) 40.0 and over, adult: Secondary | ICD-10-CM | POA: Diagnosis not present

## 2016-08-04 DIAGNOSIS — R079 Chest pain, unspecified: Secondary | ICD-10-CM | POA: Diagnosis not present

## 2016-08-04 DIAGNOSIS — K219 Gastro-esophageal reflux disease without esophagitis: Secondary | ICD-10-CM | POA: Diagnosis not present

## 2016-08-04 DIAGNOSIS — Z87891 Personal history of nicotine dependence: Secondary | ICD-10-CM | POA: Diagnosis not present

## 2016-08-05 DIAGNOSIS — Z6841 Body Mass Index (BMI) 40.0 and over, adult: Secondary | ICD-10-CM | POA: Diagnosis not present

## 2016-08-05 DIAGNOSIS — R42 Dizziness and giddiness: Secondary | ICD-10-CM | POA: Diagnosis not present

## 2016-08-05 DIAGNOSIS — K219 Gastro-esophageal reflux disease without esophagitis: Secondary | ICD-10-CM | POA: Diagnosis not present

## 2016-08-05 DIAGNOSIS — Z87891 Personal history of nicotine dependence: Secondary | ICD-10-CM | POA: Diagnosis not present

## 2016-08-05 DIAGNOSIS — R110 Nausea: Secondary | ICD-10-CM | POA: Diagnosis not present

## 2016-08-05 DIAGNOSIS — E7800 Pure hypercholesterolemia, unspecified: Secondary | ICD-10-CM | POA: Diagnosis not present

## 2016-08-05 DIAGNOSIS — R079 Chest pain, unspecified: Secondary | ICD-10-CM | POA: Diagnosis not present

## 2016-08-05 DIAGNOSIS — I10 Essential (primary) hypertension: Secondary | ICD-10-CM | POA: Diagnosis not present

## 2016-08-06 ENCOUNTER — Telehealth: Payer: Self-pay | Admitting: Family

## 2016-08-06 NOTE — Telephone Encounter (Signed)
Please contact patient to arrange a hospital follow up.

## 2016-08-07 NOTE — Telephone Encounter (Signed)
Patient scheduled hospital follow up with PCP for 08/09/2016

## 2016-08-09 ENCOUNTER — Encounter: Payer: Self-pay | Admitting: Family

## 2016-08-09 ENCOUNTER — Ambulatory Visit (INDEPENDENT_AMBULATORY_CARE_PROVIDER_SITE_OTHER): Payer: BLUE CROSS/BLUE SHIELD | Admitting: Family

## 2016-08-09 VITALS — BP 151/82 | HR 73 | Temp 97.9°F | Resp 18 | Ht 67.0 in | Wt 253.6 lb

## 2016-08-09 DIAGNOSIS — M940 Chondrocostal junction syndrome [Tietze]: Secondary | ICD-10-CM | POA: Diagnosis not present

## 2016-08-09 NOTE — Progress Notes (Signed)
Pre visit review using our clinic review tool, if applicable. No additional management support is needed unless otherwise documented below in the visit note. 

## 2016-08-09 NOTE — Progress Notes (Signed)
Subjective:    [name], male    DOB: April 02, 1956, 60 y.o.   MRN: [id number]  HPI  Mr.  [name] is a 60 yr old male who presents today for hospital follow up.  Hospital discharge summary is reviewed.  Pt was admitted 08/03/16-08/05/16 with atypical chest pain to [facility]. He apparently had an unremarkable EKG and negative cardiac enzymes. It was recommended that he have a nuclear stress test which was performed during his hospitalization and did not shwo any reversible ischemia.  He was ultimately discharged home.    Review of Systems See HPI  Past Medical History:  Diagnosis Date  . Anemia 11/16/2014  . Bladder disease    "an unusual bladder disease; don't know what it's called" (06/30/2015)  . Bulging lumbar disc   . GERD (gastroesophageal reflux disease)   . History of hiatal hernia   . Hyperglycemia 11/16/2014  . Hyperlipemia   . Hypertension   . Joint pain, knee   . Migraine    06/30/2015 "maybe 2-3 times/year; not as bad as I used to have them"  . OSA (obstructive sleep apnea)    "dr took me off the mask; told me to lose weight" (06/30/2015)     Social History   Social History  . Marital status: Married    Spouse name: N/A  . Number of children: 3  . Years of education: N/A   Occupational History  . Not on file.   Social History Main Topics  . Smoking status: Former Smoker    Types: Cigarettes  . Smokeless tobacco: Never Used     Comment: "stopped smoking in ~ 1990"  . Alcohol use No  . Drug use: No  . Sexual activity: Yes   Other Topics Concern  . 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 ESOPHAGOGASTRODUODENOSCOPY    . ESOPHAGOGASTRODUODENOSCOPY (EGD) WITH ESOPHAGEAL DILATION  ~ 2014  . KNEE ARTHROSCOPY Right 1995  . NASAL SEPTUM SURGERY  ~ 1972  . TOTAL KNEE  ARTHROPLASTY  10/11/2011   Procedure: TOTAL KNEE ARTHROPLASTY;  Surgeon: Rudean Haskell, MD;  Location: [facility];  Service: Orthopedics;  Laterality: Right;  . 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 Anesthesia problems Neg Hx   . Hypotension Neg Hx   . Malignant hyperthermia Neg Hx   . Pseudochol deficiency Neg Hx   . Stroke Mother 6    Died of CVA  . Hypertension Mother     No Known Allergies  Current Outpatient Prescriptions on File Prior to Visit  Medication Sig Dispense Refill  . amLODipine-benazepril (LOTREL) 10-20 MG capsule Take 1 capsule by mouth daily. 90 capsule 1  . b complex vitamins tablet Take 1 tablet by mouth daily.    . ferrous sulfate 325 (65 FE) MG tablet Take 1 tablet (325 mg total) by mouth 2 (two) times daily with a meal. 60 tablet 0  . fexofenadine (ALLEGRA) 180 MG tablet Take 180 mg by mouth daily.    . hydrochlorothiazide (MICROZIDE) 12.5 MG capsule Take 1 capsule (12.5 mg total) by mouth daily. 90 capsule 1  . Multiple Vitamin (MULTIVITAMIN WITH MINERALS) TABS tablet Take  1 tablet by mouth daily.    Marland Kitchen omeprazole (PRILOSEC) 40 MG capsule Take 1 capsule (40 mg total) by mouth daily. 90 capsule 1  . potassium chloride (K-DUR) 10 MEQ tablet Take 2 tablets (20 mEq total) by mouth daily. 180 tablet 1   No current facility-administered medications on file prior to visit.     BP (!) 151/82 (BP Location: Right Arm, Cuff Size: Large)   Pulse 73   Temp 97.9 F (36.6 C) (Oral)   Resp 18   Ht 5\' 7"  (1.702 m)   Wt 253 lb 9.6 oz (115 kg)   SpO2 98% Comment: room air  BMI 39.72 kg/m       Objective:   Physical Exam  Constitutional: He is oriented to person, place, and time. He appears well-developed and well-nourished. No distress.  HENT:  Head: Normocephalic and atraumatic.  Cardiovascular: Normal rate and regular rhythm.   No murmur heard. Pulmonary/Chest: Effort normal  and breath sounds normal. No respiratory distress. He has no wheezes. He has no rales.  Musculoskeletal: He exhibits no edema.  Reproducible chest pain left anterior chest wall  Neurological: He is alert and oriented to person, place, and time.  Skin: Skin is warm and dry.  Psychiatric: He has a normal mood and affect. His behavior is normal. Thought content normal.          Assessment & Plan:  Costochondritis- exam most consistent with musculoskeletal chest pain. Will rx with short course of ibuprofen or aleve.

## 2016-08-09 NOTE — Patient Instructions (Signed)
You may use aleve or ibuprofen as needed for a few days for your chest wall tenderness. Please call if your pain worsens or if it does not improve.

## 2016-08-11 ENCOUNTER — Other Ambulatory Visit: Payer: Self-pay | Admitting: Family

## 2016-08-12 DIAGNOSIS — H3561 Retinal hemorrhage, right eye: Secondary | ICD-10-CM | POA: Diagnosis not present

## 2016-08-12 DIAGNOSIS — H5203 Hypermetropia, bilateral: Secondary | ICD-10-CM | POA: Diagnosis not present

## 2016-08-12 DIAGNOSIS — H53022 Refractive amblyopia, left eye: Secondary | ICD-10-CM | POA: Diagnosis not present

## 2016-08-12 DIAGNOSIS — H524 Presbyopia: Secondary | ICD-10-CM | POA: Diagnosis not present

## 2016-08-30 ENCOUNTER — Encounter: Payer: Self-pay | Admitting: Family

## 2016-08-30 ENCOUNTER — Ambulatory Visit (INDEPENDENT_AMBULATORY_CARE_PROVIDER_SITE_OTHER): Payer: BLUE CROSS/BLUE SHIELD | Admitting: Family

## 2016-08-30 ENCOUNTER — Ambulatory Visit: Payer: BLUE CROSS/BLUE SHIELD | Admitting: Family

## 2016-08-30 VITALS — BP 135/79 | HR 83 | Temp 97.7°F | Resp 20 | Ht 67.0 in | Wt 256.0 lb

## 2016-08-30 DIAGNOSIS — I10 Essential (primary) hypertension: Secondary | ICD-10-CM | POA: Diagnosis not present

## 2016-08-30 DIAGNOSIS — H6691 Otitis media, unspecified, right ear: Secondary | ICD-10-CM

## 2016-08-30 DIAGNOSIS — R0789 Other chest pain: Secondary | ICD-10-CM | POA: Diagnosis not present

## 2016-08-30 DIAGNOSIS — J029 Acute pharyngitis, unspecified: Secondary | ICD-10-CM | POA: Diagnosis not present

## 2016-08-30 LAB — POCT RAPID STREP A (OFFICE): Rapid Strep A Screen: NEGATIVE

## 2016-08-30 MED ORDER — AMOXICILLIN 500 MG PO CAPS: 500.0000 mg | ORAL_CAPSULE | Freq: Three times a day (TID) | ORAL | 0 refills | Status: DC

## 2016-08-30 NOTE — Progress Notes (Signed)
Subjective:    [name], male    DOB: [birth year], 61 y.o.   MRN: [id number]  HPI  Jeremy [name] is a 61 yr old male who presents today for follow up.   HTN- continues lotrel and hctz.   BP Readings from Last 3 Encounters:  08/30/16 135/79  08/09/16 (!) 151/82  05/03/16 139/77   Anemia- continues iron. Denies constipation.  Lab Results  Component Value Date   WBC 8.4 05/03/2016   HGB 14.0 05/03/2016   HCT 40.0 05/03/2016   MCV 88.5 05/03/2016   PLT 242.0 05/03/2016   Sore throat- began yesterday.  Has some sinus and nasal "burning." denies fever.    Atypical CP- had neg cardiac work up including neg stress test.  Notes occasional intermittent chest pain "still."  Notes a great deal of work stress.   Review of Systems See HPI  Past Medical History:  Diagnosis Date  . Anemia 11/16/2014  . Bladder disease    "an unusual bladder disease; don't know what it's called" (06/30/2015)  . Bulging lumbar disc   . GERD (gastroesophageal reflux disease)   . History of hiatal hernia   . Hyperglycemia 11/16/2014  . Hyperlipemia   . Hypertension   . Joint pain, knee   . Migraine    06/30/2015 "maybe 2-3 times/year; not as bad as I used to have them"  . OSA (obstructive sleep apnea)    "dr took me off the mask; told me to lose weight" (06/30/2015)     Social History   Social History  . Marital status: Married    Spouse name: N/A  . Number of children: 3  . Years of education: N/A   Occupational History  . Not on file.   Social History Main Topics  . Smoking status: Former Smoker    Types: Cigarettes  . Smokeless tobacco: Never Used     Comment: "stopped smoking in ~ 1990"  . Alcohol use No  . Drug use: No  . Sexual activity: Yes   Other Topics Concern  . 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 ESOPHAGOGASTRODUODENOSCOPY    . ESOPHAGOGASTRODUODENOSCOPY (EGD) WITH ESOPHAGEAL DILATION  ~ 2014  . KNEE ARTHROSCOPY Right 1995  . NASAL SEPTUM SURGERY  ~ 1972  . TOTAL KNEE ARTHROPLASTY  10/11/2011   Procedure: TOTAL KNEE ARTHROPLASTY;  Surgeon: Rudean Haskell, MD;  Location: [place];  Service: Orthopedics;  Laterality: Right;  . 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 Anesthesia problems Neg Hx   . Hypotension Neg Hx   . Malignant hyperthermia Neg Hx   . Pseudochol deficiency Neg Hx   . Stroke Mother 13    Died of CVA  . Hypertension Mother     No Known Allergies  Current Outpatient Prescriptions on File Prior to Visit  Medication Sig Dispense Refill  . amLODipine-benazepril (LOTREL) 10-20 MG capsule TAKE 1 CAPSULE DAILY 90 capsule 0  . b complex vitamins tablet Take 1 tablet by mouth daily.    . ferrous sulfate 325 (65 FE) MG tablet Take 1 tablet (325 mg total) by mouth 2 (two) times daily with  a meal. 60 tablet 0  . fexofenadine (ALLEGRA) 180 MG tablet Take 180 mg by mouth daily.    . hydrochlorothiazide (MICROZIDE) 12.5 MG capsule TAKE 1 CAPSULE DAILY 90 capsule 0  . Multiple Vitamin (MULTIVITAMIN WITH MINERALS) TABS tablet Take 1 tablet by mouth daily.    Marland Kitchen omeprazole (PRILOSEC) 40 MG capsule Take 1 capsule (40 mg total) by mouth daily. 90 capsule 1  . potassium chloride (K-DUR,KLOR-CON) 10 MEQ tablet TAKE 2 TABLETS DAILY 180 tablet 0   No current facility-administered medications on file prior to visit.     BP 135/79 (BP Location: Right Arm, Cuff Size: Large)   Pulse 83   Temp 97.7 F (36.5 C) (Oral)   Resp 20   Ht 5\' 7"  (1.702 m)   Wt 256 lb (116.1 kg)   SpO2 96% Comment: room air  BMI 40.10 kg/m       Objective:   Physical Exam  Constitutional: He is oriented to person, place, and time. He appears well-developed and well-nourished. No  distress.  HENT:  Head: Normocephalic and atraumatic.  Right Ear: Ear canal normal. Tympanic membrane is erythematous.  Left Ear: Tympanic membrane and ear canal normal.  Mouth/Throat: Posterior oropharyngeal erythema present. No oropharyngeal exudate.  Faint blistering noted on uvula  Cardiovascular: Normal rate and regular rhythm.   No murmur heard. Pulmonary/Chest: Effort normal and breath sounds normal. No respiratory distress. He has no wheezes. He has no rales.  Musculoskeletal: He exhibits no edema.  Neurological: He is alert and oriented to person, place, and time.  Skin: Skin is warm and dry.  Psychiatric: He has a normal mood and affect. His behavior is normal. Thought content normal.          Assessment & Plan:  R Otitis media- early OM noted on right. Rapid strep is negative. Will rx with amoxicillin.   Wt Readings from Last 3 Encounters:  08/30/16 256 lb (116.1 kg)  08/09/16 253 lb 9.6 oz (115 kg)  05/03/16 251 lb (113.9 kg)   Atypical chest pain- pt is concerned that he may need a cardiac cath since his CP continues intermittently. I clinically doubt cardiac etiology, I really think that stress is contributing to his symptoms.  Advised patient that I would refer him to cardiology for consultation and further discussion.

## 2016-08-30 NOTE — Assessment & Plan Note (Signed)
BP stable on current meds. Continue same.  

## 2016-08-30 NOTE — Patient Instructions (Signed)
You will be contacted about your referral to cardiology. Begin amoxicillin for your right ear infection.

## 2016-08-30 NOTE — Progress Notes (Signed)
Pre visit review using our clinic review tool, if applicable. No additional management support is needed unless otherwise documented below in the visit note. 

## 2016-09-12 ENCOUNTER — Other Ambulatory Visit: Payer: Self-pay | Admitting: Family

## 2016-09-13 ENCOUNTER — Ambulatory Visit (INDEPENDENT_AMBULATORY_CARE_PROVIDER_SITE_OTHER): Payer: BLUE CROSS/BLUE SHIELD | Admitting: Ophthalmology

## 2016-10-07 ENCOUNTER — Ambulatory Visit (INDEPENDENT_AMBULATORY_CARE_PROVIDER_SITE_OTHER): Payer: BLUE CROSS/BLUE SHIELD | Admitting: Ophthalmology

## 2016-10-07 DIAGNOSIS — H3562 Retinal hemorrhage, left eye: Secondary | ICD-10-CM | POA: Diagnosis not present

## 2016-10-07 DIAGNOSIS — H35033 Hypertensive retinopathy, bilateral: Secondary | ICD-10-CM

## 2016-10-07 DIAGNOSIS — I10 Essential (primary) hypertension: Secondary | ICD-10-CM

## 2016-10-07 DIAGNOSIS — H2513 Age-related nuclear cataract, bilateral: Secondary | ICD-10-CM | POA: Diagnosis not present

## 2016-10-07 DIAGNOSIS — H43813 Vitreous degeneration, bilateral: Secondary | ICD-10-CM | POA: Diagnosis not present

## 2016-11-10 ENCOUNTER — Other Ambulatory Visit: Payer: Self-pay | Admitting: Family

## 2016-11-29 ENCOUNTER — Ambulatory Visit (INDEPENDENT_AMBULATORY_CARE_PROVIDER_SITE_OTHER): Payer: BLUE CROSS/BLUE SHIELD | Admitting: Family

## 2016-11-29 ENCOUNTER — Encounter: Payer: Self-pay | Admitting: Family

## 2016-11-29 VITALS — BP 153/96 | HR 76 | Temp 98.1°F | Resp 18 | Ht 67.0 in | Wt 252.0 lb

## 2016-11-29 DIAGNOSIS — I10 Essential (primary) hypertension: Secondary | ICD-10-CM

## 2016-11-29 DIAGNOSIS — E611 Iron deficiency: Secondary | ICD-10-CM

## 2016-11-29 DIAGNOSIS — R739 Hyperglycemia, unspecified: Secondary | ICD-10-CM | POA: Diagnosis not present

## 2016-11-29 DIAGNOSIS — K219 Gastro-esophageal reflux disease without esophagitis: Secondary | ICD-10-CM

## 2016-11-29 MED ORDER — FERROUS SULFATE 325 (65 FE) MG PO TABS: 325.0000 mg | ORAL_TABLET | Freq: Every day | ORAL | 0 refills | Status: DC

## 2016-11-29 MED ORDER — OMEPRAZOLE 40 MG PO CPDR: 40.0000 mg | DELAYED_RELEASE_CAPSULE | Freq: Every day | ORAL | 1 refills | Status: DC

## 2016-11-29 MED ORDER — AMLODIPINE BESY-BENAZEPRIL HCL 10-40 MG PO CAPS: 1.0000 | ORAL_CAPSULE | Freq: Every day | ORAL | 0 refills | Status: DC

## 2016-11-29 NOTE — Progress Notes (Signed)
Subjective:    [name], male    DOB: 06/24/1956, 61 y.o.   MRN: [id number]  HPI  Mr. Jeremy [name] is a 61 yr old male who presents today for follow up.  1) HTN-  Currently maintained on hctz.   BP Readings from Last 3 Encounters:  11/29/16 (!) 153/96  08/30/16 135/79  08/09/16 (!) 151/82   2) Hyperglycemia- reports fair compliance with diet.  Lab Results  Component Value Date   HGBA1C 5.6 05/03/2016   3) gerd- reports that overall well controlled on omeprazole.   4) Iron deficiency- taking iron once daily. Denies GI side effects.   Lab Results  Component Value Date   WBC 8.4 05/03/2016   HGB 14.0 05/03/2016   HCT 40.0 05/03/2016   MCV 88.5 05/03/2016   PLT 242.0 05/03/2016      Review of Systems See HPI  Past Medical History:  Diagnosis Date  . Anemia 11/16/2014  . Bladder disease    "an unusual bladder disease; don't know what it's called" (06/30/2015)  . Bulging lumbar disc   . GERD (gastroesophageal reflux disease)   . History of hiatal hernia   . Hyperglycemia 11/16/2014  . Hyperlipemia   . Hypertension   . Joint pain, knee   . Migraine    06/30/2015 "maybe 2-3 times/year; not as bad as I used to have them"  . OSA (obstructive sleep apnea)    "dr took me off the mask; told me to lose weight" (06/30/2015)     Social History   Social History  . Marital status: Married    Spouse name: N/A  . Number of children: 3  . Years of education: N/A   Occupational History  . Not on file.   Social History Main Topics  . Smoking status: Former Smoker    Types: Cigarettes  . Smokeless tobacco: Never Used     Comment: "stopped smoking in ~ 1990"  . Alcohol use No  . Drug use: No  . Sexual activity: Yes   Other Topics Concern  . 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 ESOPHAGOGASTRODUODENOSCOPY    . ESOPHAGOGASTRODUODENOSCOPY (EGD) WITH ESOPHAGEAL DILATION  ~ 2014  . KNEE ARTHROSCOPY Right 1995  . NASAL SEPTUM SURGERY  ~ 1972  . TOTAL KNEE ARTHROPLASTY  10/11/2011   Procedure: TOTAL KNEE ARTHROPLASTY;  Surgeon: Rudean Haskell, MD;  Location: [place] [facility];  Service: Orthopedics;  Laterality: Right;  . 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 Anesthesia problems Neg Hx   . Hypotension Neg Hx   . Malignant hyperthermia Neg Hx   . Pseudochol deficiency Neg Hx   . Stroke Mother 39    Died of CVA  . Hypertension Mother     No Known Allergies  Current Outpatient Prescriptions on File Prior to Visit  Medication Sig Dispense Refill  . b complex vitamins tablet Take 1 tablet by mouth daily.    . fexofenadine (ALLEGRA) 180 MG tablet Take 180 mg by mouth daily.    . hydrochlorothiazide (MICROZIDE) 12.5 MG capsule TAKE 1 CAPSULE DAILY 90 capsule 0  . Multiple Vitamin (MULTIVITAMIN WITH MINERALS) TABS tablet  Take 1 tablet by mouth daily.    . potassium chloride (K-DUR,KLOR-CON) 10 MEQ tablet TAKE 2 TABLETS DAILY 180 tablet 0   No current facility-administered medications on file prior to visit.     BP (!) 153/96 (BP Location: Right Arm, Cuff Size: Large)   Pulse 76   Temp 98.1 F (36.7 C) (Oral)   Resp 18   Ht 5\' 7"  (1.702 m)   Wt 252 lb (114.3 kg)   SpO2 98% Comment: room air  BMI 39.47 kg/m       Objective:   Physical Exam  Constitutional: He is oriented to person, place, and time. He appears well-developed and well-nourished. No distress.  HENT:  Head: Normocephalic and atraumatic.  Cardiovascular: Normal rate and regular rhythm.   No murmur heard. Pulmonary/Chest: Effort normal and breath sounds normal. No respiratory distress. He has no wheezes. He has no rales.  Musculoskeletal: He exhibits no edema.  Neurological: He is alert and oriented to  person, place, and time.  Skin: Skin is warm and dry.  Psychiatric: He has a normal mood and affect. His behavior is normal. Thought content normal.          Assessment & Plan:

## 2016-11-29 NOTE — Assessment & Plan Note (Signed)
Obtain follow up a1c. Discussed healthy diet, exercise, weight loss.

## 2016-11-29 NOTE — Assessment & Plan Note (Signed)
Check follow up iron and cbc.

## 2016-11-29 NOTE — Progress Notes (Signed)
Pre visit review using our clinic review tool, if applicable. No additional management support is needed unless otherwise documented below in the visit note. 

## 2016-11-29 NOTE — Patient Instructions (Addendum)
Please increase lotrel to 10-40.  Complete lab work prior to leaving.

## 2016-11-29 NOTE — Assessment & Plan Note (Signed)
Stable on PPI. Continue same.  

## 2016-11-29 NOTE — Assessment & Plan Note (Signed)
Uncontrolled. Will increase lotrel from 10-20 to 10-40mg . Follow up in 2 weeks for nurse visit BP check and follow up bmet.

## 2016-11-30 LAB — CBC WITH DIFFERENTIAL/PLATELET
Basophils Absolute: 0.1 10*3/uL (ref 0.0–0.1)
Basophils Relative: 0.9 % (ref 0.0–3.0)
EOS PCT: 3.5 % (ref 0.0–5.0)
Eosinophils Absolute: 0.3 10*3/uL (ref 0.0–0.7)
HEMATOCRIT: 41.6 % (ref 39.0–52.0)
Hemoglobin: 14.3 g/dL (ref 13.0–17.0)
LYMPHS ABS: 2.8 10*3/uL (ref 0.7–4.0)
Lymphocytes Relative: 33.5 % (ref 12.0–46.0)
MCHC: 34.3 g/dL (ref 30.0–36.0)
MCV: 88.8 fl (ref 78.0–100.0)
MONOS PCT: 7.8 % (ref 3.0–12.0)
Monocytes Absolute: 0.6 10*3/uL (ref 0.1–1.0)
NEUTROS ABS: 4.5 10*3/uL (ref 1.4–7.7)
NEUTROS PCT: 54.3 % (ref 43.0–77.0)
PLATELETS: 291 10*3/uL (ref 150.0–400.0)
RBC: 4.68 Mil/uL (ref 4.22–5.81)
RDW: 13.5 % (ref 11.5–15.5)
WBC: 8.2 10*3/uL (ref 4.0–10.5)

## 2016-11-30 LAB — IRON: Iron: 35 ug/dL — ABNORMAL LOW (ref 42–165)

## 2016-11-30 LAB — BASIC METABOLIC PANEL
BUN: 12 mg/dL (ref 6–23)
CHLORIDE: 103 meq/L (ref 96–112)
CO2: 28 meq/L (ref 19–32)
CREATININE: 0.91 mg/dL (ref 0.40–1.50)
Calcium: 9.2 mg/dL (ref 8.4–10.5)
GFR: 89.73 mL/min (ref >60.00)
Glucose, Bld: 118 mg/dL — ABNORMAL HIGH (ref 70–99)
Potassium: 3.6 mEq/L (ref 3.5–5.1)
Sodium: 140 mEq/L (ref 135–145)

## 2016-11-30 LAB — HEMOGLOBIN A1C: Hgb A1c MFr Bld: 5.8 % (ref 4.6–6.5)

## 2016-12-01 ENCOUNTER — Other Ambulatory Visit: Payer: Self-pay | Admitting: *Deleted

## 2016-12-01 MED ORDER — FERROUS SULFATE 325 (65 FE) MG PO TABS: 325.0000 mg | ORAL_TABLET | Freq: Two times a day (BID) | ORAL | 0 refills | Status: DC

## 2016-12-13 ENCOUNTER — Ambulatory Visit (INDEPENDENT_AMBULATORY_CARE_PROVIDER_SITE_OTHER): Payer: BLUE CROSS/BLUE SHIELD | Admitting: Family

## 2016-12-13 ENCOUNTER — Encounter: Payer: Self-pay | Admitting: Family

## 2016-12-13 VITALS — BP 128/80 | HR 80 | Temp 98.0°F | Resp 20 | Ht 67.0 in | Wt 258.6 lb

## 2016-12-13 DIAGNOSIS — I10 Essential (primary) hypertension: Secondary | ICD-10-CM | POA: Diagnosis not present

## 2016-12-13 NOTE — Progress Notes (Signed)
Subjective:    [name], male    DOB: 06-29-1956, 61 y.o.   MRN: [id number]  HPI  Jeremy [name] is a 61 yr old male who presents today for follow up of his HTN. Last visit we increased lotrel from 10-20 to 10-40.    Review of Systems See HPI  Past Medical History:  Diagnosis Date  . Anemia 11/16/2014  . Bladder disease    "an unusual bladder disease; don't know what it's called" (06/30/2015)  . Bulging lumbar disc   . GERD (gastroesophageal reflux disease)   . History of hiatal hernia   . Hyperglycemia 11/16/2014  . Hyperlipemia   . Hypertension   . Joint pain, knee   . Migraine    06/30/2015 "maybe 2-3 times/year; not as bad as I used to have them"  . OSA (obstructive sleep apnea)    "dr took me off the mask; told me to lose weight" (06/30/2015)     Social History   Social History  . Marital status: Married    Spouse name: N/A  . Number of children: 3  . Years of education: N/A   Occupational History  . Not on file.   Social History Main Topics  . Smoking status: Former Smoker    Types: Cigarettes  . Smokeless tobacco: Never Used     Comment: "stopped smoking in ~ 1990"  . Alcohol use No  . Drug use: No  . Sexual activity: Yes   Other Topics Concern  . 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 ESOPHAGOGASTRODUODENOSCOPY    . ESOPHAGOGASTRODUODENOSCOPY (EGD) WITH ESOPHAGEAL DILATION  ~ 2014  . KNEE ARTHROSCOPY Right 1995  . NASAL SEPTUM SURGERY  ~ 1972  . TOTAL KNEE ARTHROPLASTY  10/11/2011   Procedure: TOTAL KNEE ARTHROPLASTY;  Surgeon: Rudean Haskell, MD;  Location: [place];  Service: Orthopedics;  Laterality: Right;  . 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 Anesthesia problems  Neg Hx   . Hypotension Neg Hx   . Malignant hyperthermia Neg Hx   . Pseudochol deficiency Neg Hx   . Stroke Mother 21    Died of CVA  . Hypertension Mother     No Known Allergies  Current Outpatient Prescriptions on File Prior to Visit  Medication Sig Dispense Refill  . amLODipine-benazepril (LOTREL) 10-40 MG capsule Take 1 capsule by mouth daily. 30 capsule 0  . b complex vitamins tablet Take 1 tablet by mouth daily.    . ferrous sulfate 325 (65 FE) MG tablet Take 1 tablet (325 mg total) by mouth 2 (two) times daily with a meal. 60 tablet 0  . fexofenadine (ALLEGRA) 180 MG tablet Take 180 mg by mouth daily.    . hydrochlorothiazide (MICROZIDE) 12.5 MG capsule TAKE 1 CAPSULE DAILY 90 capsule 0  . Multiple Vitamin (MULTIVITAMIN WITH MINERALS) TABS tablet Take 1 tablet by mouth daily.    Marland Kitchen omeprazole (PRILOSEC) 40 MG capsule Take 1 capsule (40 mg total) by mouth daily. 90 capsule 1  . potassium chloride (K-DUR,KLOR-CON) 10 MEQ tablet TAKE 2 TABLETS DAILY 180 tablet 0   No current facility-administered medications on file prior to visit.  BP 128/80   Pulse 80   Temp 98 F (36.7 C) (Oral)   Resp 20   Ht 5\' 7"  (1.702 m)   Wt 258 lb 9.6 oz (117.3 kg)   SpO2 96% Comment: room air  BMI 40.50 kg/m       Objective:   Physical Exam  Constitutional: He is oriented to person, place, and time. He appears well-developed and well-nourished. No distress.  HENT:  Head: Normocephalic and atraumatic.  Cardiovascular: Normal rate and regular rhythm.   No murmur heard. Pulmonary/Chest: Effort normal and breath sounds normal. No respiratory distress. He has no wheezes. He has no rales.  Musculoskeletal: He exhibits no edema.  Neurological: He is alert and oriented to person, place, and time.  Skin: Skin is warm and dry.  Psychiatric: He has a normal mood and affect. His behavior is normal. Thought content normal.          Assessment & Plan:  HTN- improved. Continue current doses.  Obtain follow up bmet.

## 2016-12-13 NOTE — Patient Instructions (Signed)
Continue current medications. Complete lab work prior to leaving.

## 2016-12-13 NOTE — Progress Notes (Signed)
Pre visit review using our clinic review tool, if applicable. No additional management support is needed unless otherwise documented below in the visit note. 

## 2016-12-14 ENCOUNTER — Encounter: Payer: Self-pay | Admitting: Family

## 2016-12-14 LAB — BASIC METABOLIC PANEL
BUN: 14 mg/dL (ref 6–23)
CO2: 31 mEq/L (ref 19–32)
CREATININE: 0.89 mg/dL (ref 0.40–1.50)
Calcium: 9.1 mg/dL (ref 8.4–10.5)
Chloride: 104 mEq/L (ref 96–112)
GFR: 92.04 mL/min (ref >60.00)
Glucose, Bld: 88 mg/dL (ref 70–99)
Potassium: 4.2 mEq/L (ref 3.5–5.1)
Sodium: 141 mEq/L (ref 135–145)

## 2016-12-17 ENCOUNTER — Telehealth: Payer: Self-pay | Admitting: Family

## 2016-12-17 MED ORDER — AMLODIPINE BESY-BENAZEPRIL HCL 10-40 MG PO CAPS: 1.0000 | ORAL_CAPSULE | Freq: Every day | ORAL | 1 refills | Status: DC

## 2016-12-17 NOTE — Telephone Encounter (Signed)
Caller name: Larson Limones Relationship to patient: self Can be reached: [phone] Pharmacy: [facility]  Reason for call: Pt has 2 weeks of new dose amlodipine 10-40mg . Please send 90 day supply to [facility]

## 2016-12-17 NOTE — Telephone Encounter (Signed)
Rx sent, notified pt's wife.

## 2017-02-08 ENCOUNTER — Other Ambulatory Visit: Payer: Self-pay | Admitting: Family

## 2017-03-18 ENCOUNTER — Ambulatory Visit: Payer: BLUE CROSS/BLUE SHIELD | Admitting: Family

## 2017-03-18 ENCOUNTER — Ambulatory Visit (INDEPENDENT_AMBULATORY_CARE_PROVIDER_SITE_OTHER): Payer: BLUE CROSS/BLUE SHIELD | Admitting: Family

## 2017-03-18 VITALS — BP 132/88 | HR 74 | Temp 98.2°F | Ht 67.0 in | Wt 259.2 lb

## 2017-03-18 DIAGNOSIS — I10 Essential (primary) hypertension: Secondary | ICD-10-CM | POA: Diagnosis not present

## 2017-03-18 DIAGNOSIS — R739 Hyperglycemia, unspecified: Secondary | ICD-10-CM

## 2017-03-18 DIAGNOSIS — E669 Obesity, unspecified: Secondary | ICD-10-CM | POA: Diagnosis not present

## 2017-03-18 DIAGNOSIS — Z6841 Body Mass Index (BMI) 40.0 and over, adult: Secondary | ICD-10-CM

## 2017-03-18 DIAGNOSIS — IMO0001 Reserved for inherently not codable concepts without codable children: Secondary | ICD-10-CM

## 2017-03-18 NOTE — Patient Instructions (Signed)
Please work on continue exercise and weight loss efforts. Continue current medications.

## 2017-03-18 NOTE — Progress Notes (Signed)
Subjective:    [name], male    DOB: December 07, 1955, 61 y.o.   MRN: [id number]  HPI  Mr. Stanhope is A 61 year old male who presents today for routine follow-up.  HTN- He continues lotrel and hctz 12.5. He admits to some weight gain and reports that he recently started going to the gym and is focusing on weight loss.   BP Readings from Last 3 Encounters:  03/18/17 (!) 144/92  12/13/16 128/80  11/29/16 (!) 153/96     Wt Readings from Last 3 Encounters:  03/18/17 259 lb 3.2 oz (117.6 kg)  12/13/16 258 lb 9.6 oz (117.3 kg)  11/29/16 252 lb (114.3 kg)   Hyperglycemia-last visit A1c was noted to be stable at 5.8. Lab Results  Component Value Date   HGBA1C 5.8 11/29/2016   HGBA1C 5.6 05/03/2016   HGBA1C 5.8 01/12/2016   Lab Results  Component Value Date   LDLCALC 139 (H) 06/30/2015   CREATININE 0.89 12/13/2016    Review of Systems    see HPI  Past Medical History:  Diagnosis Date  . Anemia 11/16/2014  . Bladder disease    "an unusual bladder disease; don't know what it's called" (06/30/2015)  . Bulging lumbar disc   . GERD (gastroesophageal reflux disease)   . History of hiatal hernia   . Hyperglycemia 11/16/2014  . Hyperlipemia   . Hypertension   . Joint pain, knee   . Migraine    06/30/2015 "maybe 2-3 times/year; not as bad as I used to have them"  . OSA (obstructive sleep apnea)    "dr took me off the mask; told me to lose weight" (06/30/2015)     Social History   Social History  . Marital status: Married    Spouse name: N/A  . Number of children: 3  . Years of education: N/A   Occupational History  . Not on file.   Social History Main Topics  . Smoking status: Former Smoker    Types: Cigarettes  . Smokeless tobacco: Never Used     Comment: "stopped smoking in ~ 1990"  . Alcohol use No  . Drug use: No  . Sexual activity: Yes   Other Topics Concern  . 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 ESOPHAGOGASTRODUODENOSCOPY    . ESOPHAGOGASTRODUODENOSCOPY (EGD) WITH ESOPHAGEAL DILATION  ~ 2014  . KNEE ARTHROSCOPY Right 1995  . NASAL SEPTUM SURGERY  ~ 1972  . TOTAL KNEE ARTHROPLASTY  10/11/2011   Procedure: TOTAL KNEE ARTHROPLASTY;  Surgeon: Rudean Haskell, MD;  Location: [facility];  Service: Orthopedics;  Laterality: Right;  . 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 Anesthesia problems Neg Hx   . Hypotension Neg Hx   . Malignant hyperthermia Neg Hx   . Pseudochol deficiency Neg Hx   . Stroke Mother 51       Died of CVA  . Hypertension Mother     No Known Allergies  Current Outpatient Prescriptions on File Prior to Visit  Medication Sig Dispense Refill  . amLODipine-benazepril (LOTREL) 10-40 MG capsule Take 1 capsule by mouth daily. 90 capsule 1  . b complex vitamins tablet Take  1 tablet by mouth daily.    . ferrous sulfate 325 (65 FE) MG tablet Take 1 tablet (325 mg total) by mouth 2 (two) times daily with a meal. 60 tablet 0  . fexofenadine (ALLEGRA) 180 MG tablet Take 180 mg by mouth daily.    . hydrochlorothiazide (MICROZIDE) 12.5 MG capsule TAKE 1 CAPSULE DAILY 90 capsule 1  . Multiple Vitamin (MULTIVITAMIN WITH MINERALS) TABS tablet Take 1 tablet by mouth daily.    Marland Kitchen omeprazole (PRILOSEC) 40 MG capsule Take 1 capsule (40 mg total) by mouth daily. 90 capsule 1  . potassium chloride (K-DUR,KLOR-CON) 10 MEQ tablet TAKE 2 TABLETS DAILY 180 tablet 1   No current facility-administered medications on file prior to visit.     BP 132/88   Pulse 74   Temp 98.2 F (36.8 C) (Oral)   Ht 5\' 7"  (1.702 m)   Wt 259 lb 3.2 oz (117.6 kg)   SpO2 95%   BMI 40.60 kg/m    Objective:   Physical Exam  Constitutional: He is oriented to person, place, and  time. He appears well-developed and well-nourished. No distress.  HENT:  Head: Normocephalic and atraumatic.  Cardiovascular: Normal rate and regular rhythm.   No murmur heard. Pulmonary/Chest: Effort normal and breath sounds normal. No respiratory distress. He has no wheezes. He has no rales.  Musculoskeletal: He exhibits no edema.  Neurological: He is alert and oriented to person, place, and time.  Skin: Skin is warm and dry.  Psychiatric: He has a normal mood and affect. His behavior is normal. Thought content normal.          Assessment & Plan:  Hypertension-BP is acceptable continue current meds.  Obesity-discussed importance of healthy diet exercise and weight loss.  Hyperglycemia-last A1c is stable. Recheck next visit in 3 months.

## 2017-03-31 ENCOUNTER — Other Ambulatory Visit: Payer: Self-pay | Admitting: Family

## 2017-03-31 MED ORDER — OMEPRAZOLE 40 MG PO CPDR: 40.0000 mg | DELAYED_RELEASE_CAPSULE | Freq: Every day | ORAL | 0 refills | Status: DC

## 2017-03-31 NOTE — Telephone Encounter (Signed)
Refill request for omeprazole   Pharmacy : [facility]

## 2017-03-31 NOTE — Telephone Encounter (Signed)
Rx sent 

## 2017-05-16 ENCOUNTER — Ambulatory Visit (INDEPENDENT_AMBULATORY_CARE_PROVIDER_SITE_OTHER): Payer: BLUE CROSS/BLUE SHIELD | Admitting: Family

## 2017-05-16 ENCOUNTER — Encounter: Payer: Self-pay | Admitting: Family

## 2017-05-16 VITALS — BP 152/89 | HR 77 | Temp 98.1°F | Resp 18 | Ht 67.0 in | Wt 263.4 lb

## 2017-05-16 DIAGNOSIS — Z23 Encounter for immunization: Secondary | ICD-10-CM

## 2017-05-16 DIAGNOSIS — I10 Essential (primary) hypertension: Secondary | ICD-10-CM | POA: Diagnosis not present

## 2017-05-16 MED ORDER — HYDROCHLOROTHIAZIDE 25 MG PO TABS: 25.0000 mg | ORAL_TABLET | Freq: Every day | ORAL | 3 refills | Status: DC

## 2017-05-16 NOTE — Progress Notes (Signed)
Subjective:    [name], male    DOB: Jan 10, 1956, 61 y.o.   MRN: [id number]  HPI  Jeremy [name] is a 61 yr old male who presents today to discuss needing a note for work.   He is also here for follow-up of his hypertension. Blood pressure medication includes Lotrel and hydrochlorothiazide 12.5 mg once daily.  BP Readings from Last 3 Encounters:  05/16/17 (!) 152/89  03/18/17 132/88  12/13/16 128/80      Review of Systems See HPI  Past Medical History:  Diagnosis Date  . Anemia 11/16/2014  . Bladder disease    "an unusual bladder disease; don't know what it's called" (06/30/2015)  . Bulging lumbar disc   . GERD (gastroesophageal reflux disease)   . History of hiatal hernia   . Hyperglycemia 11/16/2014  . Hyperlipemia   . Hypertension   . Joint pain, knee   . Migraine    06/30/2015 "maybe 2-3 times/year; not as bad as I used to have them"  . OSA (obstructive sleep apnea)    "dr took me off the mask; told me to lose weight" (06/30/2015)     Social History   Social History  . Marital status: Married    Spouse name: N/A  . Number of children: 3  . Years of education: N/A   Occupational History  . Not on file.   Social History Main Topics  . Smoking status: Former Smoker    Types: Cigarettes  . Smokeless tobacco: Never Used     Comment: "stopped smoking in ~ 1990"  . Alcohol use No  . Drug use: No  . Sexual activity: Yes   Other Topics Concern  . 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 ESOPHAGOGASTRODUODENOSCOPY    . ESOPHAGOGASTRODUODENOSCOPY (EGD) WITH ESOPHAGEAL DILATION  ~ 2014  . KNEE ARTHROSCOPY Right 1995  . NASAL SEPTUM SURGERY  ~ 1972  . TOTAL KNEE ARTHROPLASTY  10/11/2011   Procedure: TOTAL KNEE ARTHROPLASTY;  Surgeon: Rudean Haskell, MD;  Location: [facility];  Service: Orthopedics;  Laterality: Right;  . 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 Anesthesia problems Neg Hx   . Hypotension Neg Hx   . Malignant hyperthermia Neg Hx   . Pseudochol deficiency Neg Hx   . Stroke Mother 70       Died of CVA  . Hypertension Mother     No Known Allergies  Current Outpatient Prescriptions on File Prior to Visit  Medication Sig Dispense Refill  . amLODipine-benazepril (LOTREL) 10-40 MG capsule Take 1 capsule by mouth daily. 90 capsule 1  . b complex vitamins tablet Take 1 tablet by mouth daily.    . ferrous sulfate 325 (65 FE) MG tablet Take 1 tablet (325 mg total) by mouth 2 (two) times daily with a meal. 60 tablet 0  . fexofenadine (ALLEGRA) 180 MG tablet Take 180 mg by mouth daily.    . hydrochlorothiazide (MICROZIDE) 12.5 MG capsule TAKE 1 CAPSULE DAILY 90 capsule 1  . Multiple Vitamin (MULTIVITAMIN WITH MINERALS) TABS tablet Take 1 tablet by mouth daily.    Marland Kitchen omeprazole (PRILOSEC) 40 MG capsule Take  1 capsule (40 mg total) by mouth daily. 90 capsule 0  . potassium chloride (K-DUR,KLOR-CON) 10 MEQ tablet TAKE 2 TABLETS DAILY 180 tablet 1   No current facility-administered medications on file prior to visit.     BP (!) 152/89 (BP Location: Right Arm, Cuff Size: Large)   Pulse 77   Temp 98.1 F (36.7 C) (Oral)   Resp 18   Ht 5\' 7"  (1.702 m)   Wt 263 lb 6.4 oz (119.5 kg)   SpO2 98%   BMI 41.25 kg/m       Objective:   Physical Exam  Constitutional: He is oriented to person, place, and time. He appears well-developed and well-nourished. No distress.  HENT:  Head: Normocephalic and atraumatic.  Cardiovascular: Normal rate and regular rhythm.   No murmur heard. Pulmonary/Chest: Effort normal and breath sounds normal. No respiratory distress. He has no wheezes. He has no rales.  Musculoskeletal: He exhibits no edema.  Neurological: He is alert and  oriented to person, place, and time.  Skin: Skin is warm and dry.  Psychiatric: He has a normal mood and affect. His behavior is normal. Thought content normal.          Assessment & Plan:  Hypertension-uncontrolled. Will increase HCTZ from 12.5-25 mg once daily. I did provide a note for his employer to allow him to wear a mask while working.

## 2017-05-16 NOTE — Patient Instructions (Signed)
Please increase hctz to 25mg  once daily.

## 2017-05-19 ENCOUNTER — Encounter: Payer: Self-pay | Admitting: Family

## 2017-05-19 ENCOUNTER — Telehealth: Payer: Self-pay | Admitting: Family

## 2017-05-19 NOTE — Telephone Encounter (Signed)
Error:[id number] ° °

## 2017-05-19 NOTE — Telephone Encounter (Signed)
MO-Plz see pt req for Shinrix and Pneumonia vaccine at visit/thx dmf

## 2017-05-19 NOTE — Telephone Encounter (Signed)
Relation to pt: self Call back number:[phone]   Reason for call:  Patient would like shingle and pneumonia vaccination completed at hes next office visit 06/17/17, please advise

## 2017-05-20 NOTE — Telephone Encounter (Signed)
Noted  

## 2017-05-31 ENCOUNTER — Ambulatory Visit (INDEPENDENT_AMBULATORY_CARE_PROVIDER_SITE_OTHER): Payer: BLUE CROSS/BLUE SHIELD | Admitting: Nurse Practitioner

## 2017-05-31 ENCOUNTER — Encounter: Payer: Self-pay | Admitting: Nurse Practitioner

## 2017-05-31 ENCOUNTER — Ambulatory Visit: Payer: BLUE CROSS/BLUE SHIELD | Admitting: Internal Medicine

## 2017-05-31 ENCOUNTER — Other Ambulatory Visit: Payer: Self-pay | Admitting: Family

## 2017-05-31 VITALS — BP 139/80 | HR 75 | Temp 98.2°F | Resp 18 | Ht 67.0 in | Wt 259.2 lb

## 2017-05-31 DIAGNOSIS — F329 Major depressive disorder, single episode, unspecified: Secondary | ICD-10-CM

## 2017-05-31 DIAGNOSIS — F419 Anxiety disorder, unspecified: Secondary | ICD-10-CM

## 2017-05-31 DIAGNOSIS — F32A Depression, unspecified: Secondary | ICD-10-CM

## 2017-05-31 MED ORDER — ESCITALOPRAM OXALATE 10 MG PO TABS: ORAL_TABLET | ORAL | 0 refills | Status: DC

## 2017-05-31 NOTE — Telephone Encounter (Signed)
Rx approved and sent to the pharmacy by [facility].//AB/CMA 

## 2017-05-31 NOTE — Progress Notes (Signed)
Subjective:    [name], male    DOB: 1956/01/14, 61 y.o.   MRN: [id number]  HPI  Jeremy [name] is a 61 year old male who presents today with c/o anxiety. The anxiety has been ongoing since April. The anxiety is related to his job. He encounters a large amount of stress at work and feels overwhelmed. hes been having angry outbursts both at home and work due to his stress level. He's discussed these issues with his PCP in the past and did see a counselor that worked on Database administrator with him. He felt better for sometime, but noticed over the past several weeks hes been more angry and just feels exhausted. hes not felt like doing his normal activities, such as going to the gym, this week due to the stress. He does report sleeping well, normal appetite, and feels somewhat better once he gets home from his job. hes not had any thoughts of hurting himself or others. He's explored other job options but can not afford to leave the job at this time. He is asking for some time off work so that he can decrease his anxiety level. GAD7 score 7, PHQ9 score 5   Review of Systems  See HPI  Past Medical History:  Diagnosis Date  . Anemia 11/16/2014  . Bladder disease    "an unusual bladder disease; don't know what it's called" (06/30/2015)  . Bulging lumbar disc   . GERD (gastroesophageal reflux disease)   . History of hiatal hernia   . Hyperglycemia 11/16/2014  . Hyperlipemia   . Hypertension   . Joint pain, knee   . Migraine    06/30/2015 "maybe 2-3 times/year; not as bad as I used to have them"  . OSA (obstructive sleep apnea)    "dr took me off the mask; told me to lose weight" (06/30/2015)     Social History   Social History  . Marital status: Married    Spouse name: N/A  . Number of children: 3  . Years of education: N/A   Occupational History  . Not on file.   Social History Main Topics  . Smoking status: Former Smoker    Types: Cigarettes  . Smokeless tobacco: Never  Used     Comment: "stopped smoking in ~ 1990"  . Alcohol use No  . Drug use: No  . Sexual activity: Yes   Other Topics Concern  . 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 ESOPHAGOGASTRODUODENOSCOPY    . ESOPHAGOGASTRODUODENOSCOPY (EGD) WITH ESOPHAGEAL DILATION  ~ 2014  . KNEE ARTHROSCOPY Right 1995  . NASAL SEPTUM SURGERY  ~ 1972  . TOTAL KNEE ARTHROPLASTY  10/11/2011   Procedure: TOTAL KNEE ARTHROPLASTY;  Surgeon: Rudean Haskell, MD;  Location: [facility];  Service: Orthopedics;  Laterality: Right;  . 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 Stroke Mother 46       Died of CVA  . Hypertension Mother   . Anesthesia problems Neg Hx   . Hypotension Neg Hx   . Malignant hyperthermia Neg Hx   . Pseudochol deficiency Neg Hx     No Known Allergies  Current Outpatient Prescriptions on File Prior to Visit  Medication Sig Dispense Refill  . amLODipine-benazepril (LOTREL) 10-40 MG capsule Take 1 capsule by mouth daily. 90 capsule 1  . b complex vitamins tablet Take 1 tablet by mouth daily.    . ferrous sulfate 325 (65 FE) MG tablet Take 1 tablet (325 mg total) by mouth 2 (two) times daily with a meal. 60 tablet 0  . fexofenadine (ALLEGRA) 180 MG tablet Take 180 mg by mouth daily.    . hydrochlorothiazide (HYDRODIURIL) 25 MG tablet Take 1 tablet (25 mg total) by mouth daily. 30 tablet 3  . Multiple Vitamin (MULTIVITAMIN WITH MINERALS) TABS tablet Take 1 tablet by mouth daily.    Marland Kitchen omeprazole (PRILOSEC) 40 MG capsule Take 1 capsule (40 mg total) by mouth daily. 90 capsule 0  . potassium chloride (K-DUR,KLOR-CON) 10 MEQ tablet TAKE 2 TABLETS DAILY 180 tablet 1   No current facility-administered medications on file prior to visit.      BP 139/80 (BP Location: Right Arm, Patient Position: Sitting)   Pulse 75   Temp 98.2 F (36.8 C) (Oral)   Resp 18   Ht 5\' 7"  (1.702 m)   Wt 259 lb 3.2 oz (117.6 kg)   SpO2 97%   BMI 40.60 kg/m       Objective:   Physical Exam  Constitutional: He is oriented to person, place, and time. He appears well-developed and well-nourished.  Cardiovascular: Normal rate, regular rhythm and normal heart sounds.   Pulmonary/Chest: Effort normal and breath sounds normal.  Neurological: He is alert and oriented to person, place, and time.  Skin: Skin is warm and dry.  Psychiatric: Judgment normal. His mood appears anxious. He is agitated. He is not aggressive and not withdrawn. Cognition and memory are normal. He does not express impulsivity or inappropriate judgment. He expresses no homicidal and no suicidal ideation.       Assessment & Plan:

## 2017-05-31 NOTE — Assessment & Plan Note (Addendum)
Anxiety and depression: He will begin lexapro 1/2 tab x 1 week then titrate up to 1 whole tab on second week. We discussed common side effects such as nausea, drowsiness and weight gain.  Also discussed rare but serious side effect of suicide ideation. He is instructed to discontinue medication and go directly to ED if this occurs.  Pt verbalizes understanding. He would like to go back to the counselor he saw in the past, and he will arrange this himself. Pamphlet given for [facility] as well. He would like to initiate FMLA process, I instructed him to send paperwork to [name], his PCP. I discussed this plan with [name], NP and the patient will follow up with her in 2 weeks to evaluate progress.  25 minutes spent face-to-face with the patient today, of which I spent over 50% counseling on anxiety and coordinating psychiatric care.

## 2017-05-31 NOTE — Patient Instructions (Addendum)
Start lexapro 1/2 tablet once daily for 1 week and then increase to a full tablet once daily on week two as tolerated. This medication may cause nausea, drowsiness and weight gain. Rarely, this medication may cause suicidal thoughts. If you have any thoughts of suicide, STOP this medication and go immediately to the nearest Emergency Department.  Please follow up with a counselor and your PCP as we discussed.  Thanks for letting me take care of you today :)

## 2017-06-02 ENCOUNTER — Telehealth: Payer: Self-pay | Admitting: *Deleted

## 2017-06-02 NOTE — Telephone Encounter (Signed)
Received FMLA/LOA paperwork from [place], completed as much as possible; forwarded to provider/SLS 10/11

## 2017-06-13 ENCOUNTER — Encounter: Payer: Self-pay | Admitting: Family

## 2017-06-13 ENCOUNTER — Ambulatory Visit (INDEPENDENT_AMBULATORY_CARE_PROVIDER_SITE_OTHER): Payer: BLUE CROSS/BLUE SHIELD | Admitting: Family

## 2017-06-13 VITALS — BP 149/92 | HR 83 | Temp 97.8°F | Resp 16 | Ht 67.0 in | Wt 266.8 lb

## 2017-06-13 DIAGNOSIS — I10 Essential (primary) hypertension: Secondary | ICD-10-CM | POA: Diagnosis not present

## 2017-06-13 DIAGNOSIS — F419 Anxiety disorder, unspecified: Secondary | ICD-10-CM | POA: Diagnosis not present

## 2017-06-13 DIAGNOSIS — F329 Major depressive disorder, single episode, unspecified: Secondary | ICD-10-CM | POA: Diagnosis not present

## 2017-06-13 DIAGNOSIS — F32A Depression, unspecified: Secondary | ICD-10-CM

## 2017-06-13 MED ORDER — ESCITALOPRAM OXALATE 10 MG PO TABS: 10.0000 mg | ORAL_TABLET | Freq: Every day | ORAL | 3 refills | Status: DC

## 2017-06-13 MED ORDER — AMLODIPINE BESY-BENAZEPRIL HCL 10-40 MG PO CAPS: 1.0000 | ORAL_CAPSULE | Freq: Every day | ORAL | Status: DC

## 2017-06-13 MED ORDER — METOPROLOL SUCCINATE ER 50 MG PO TB24: 50.0000 mg | ORAL_TABLET | Freq: Every day | ORAL | 3 refills | Status: DC

## 2017-06-13 MED ORDER — OLMESARTAN-AMLODIPINE-HCTZ 40-10-12.5 MG PO TABS
ORAL_TABLET | ORAL | 2 refills | Status: DC
Start: 2017-06-13 — End: 2017-06-13

## 2017-06-13 NOTE — Patient Instructions (Addendum)
Continue lexapro and your work with your therapist. Try to work on healthy diet, regular exercise and weight loss.  Add Metoprolol once daily for blood pressure.

## 2017-06-13 NOTE — Assessment & Plan Note (Signed)
Uncontrolled. Add Toprol xl.

## 2017-06-13 NOTE — Assessment & Plan Note (Signed)
Improving. He is requesting to stay out of work for a few more months.  "I'm not ready to go back."  Unfortunately, I think it is a very difficulty work environment and this is not going to change.  I explained to him that our goal is to optimize his medications and counseling to help give him the coping mechanisms to deal with the stressful environment.  We did discuss his weight gain and I have asked him to work on healthy diet, exercise and weight loss. He thinks that his weight gain is due to binging recently at a family reunion.  Will continue current dose of lexapro and counseling. Plan to see him back on 11/9 with goal of returning to work on 11/12.

## 2017-06-13 NOTE — Progress Notes (Signed)
Subjective:    [name], male    DOB: [birth year], 61 y.o.   MRN: [id number]  HPI  Jeremy [name] is a 61 yr old male who presents today for follow up.  1) HTN- maintained on lotrel and hctz.  BP Readings from Last 3 Encounters:  06/13/17 (!) 149/83  05/31/17 139/80  05/16/17 (!) 152/89   2) Anxiety/depression-  Patient is working with a Transport planner.  Last visit he was started on lexapro 10mg . He has gained 7 pounds since his last visit.  He is working with Laqueta Due in [place] for counseling.  Patient reports that he does not feel withdrawn/tearful. Denies thoughts of hurting self or others.    Wt Readings from Last 3 Encounters:  06/13/17 266 lb 12.8 oz (121 kg)  05/31/17 259 lb 3.2 oz (117.6 kg)  05/16/17 263 lb 6.4 oz (119.5 kg)     Review of Systems See HPI  Past Medical History:  Diagnosis Date  . Anemia 11/16/2014  . Bladder disease    "an unusual bladder disease; don't know what it's called" (06/30/2015)  . Bulging lumbar disc   . GERD (gastroesophageal reflux disease)   . History of hiatal hernia   . Hyperglycemia 11/16/2014  . Hyperlipemia   . Hypertension   . Joint pain, knee   . Migraine    06/30/2015 "maybe 2-3 times/year; not as bad as I used to have them"  . OSA (obstructive sleep apnea)    "dr took me off the mask; told me to lose weight" (06/30/2015)     Social History   Social History  . Marital status: Married    Spouse name: N/A  . Number of children: 3  . Years of education: N/A   Occupational History  . Not on file.   Social History Main Topics  . Smoking status: Former Smoker    Types: Cigarettes  . Smokeless tobacco: Never Used     Comment: "stopped smoking in ~ 1990"  . Alcohol use No  . Drug use: No  . Sexual activity: Yes   Other Topics Concern  . 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 ESOPHAGOGASTRODUODENOSCOPY    . ESOPHAGOGASTRODUODENOSCOPY (EGD) WITH ESOPHAGEAL DILATION  ~ 2014  . KNEE ARTHROSCOPY Right 1995  . NASAL SEPTUM SURGERY  ~ 1972  . TOTAL KNEE ARTHROPLASTY  10/11/2011   Procedure: TOTAL KNEE ARTHROPLASTY;  Surgeon: Rudean Haskell, MD;  Location: [facility];  Service: Orthopedics;  Laterality: Right;  . 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 Stroke Mother 2       Died of CVA  . Hypertension Mother   . Anesthesia problems Neg Hx   . Hypotension Neg Hx   . Malignant hyperthermia Neg Hx   . Pseudochol deficiency Neg Hx     No Known Allergies  Current Outpatient Prescriptions on File Prior to Visit  Medication Sig Dispense Refill  . b complex vitamins tablet Take 1 tablet by mouth daily.    . ferrous sulfate 325 (65 FE) MG tablet Take 1 tablet (325 mg total) by mouth 2 (two) times daily with a meal. 60 tablet  0  . fexofenadine (ALLEGRA) 180 MG tablet Take 180 mg by mouth daily.    . hydrochlorothiazide (HYDRODIURIL) 25 MG tablet Take 1 tablet (25 mg total) by mouth daily. 30 tablet 3  . Multiple Vitamin (MULTIVITAMIN WITH MINERALS) TABS tablet Take 1 tablet by mouth daily.    Marland Kitchen omeprazole (PRILOSEC) 40 MG capsule Take 1 capsule (40 mg total) by mouth daily. 90 capsule 0  . potassium chloride (K-DUR,KLOR-CON) 10 MEQ tablet TAKE 2 TABLETS DAILY 180 tablet 1   No current facility-administered medications on file prior to visit.     BP (!) 149/83 (BP Location: Right Arm, Cuff Size: Large)   Pulse 83   Temp 97.8 F (36.6 C) (Oral)   Resp 16   Ht 5\' 7"  (1.702 m)   Wt 266 lb 12.8 oz (121 kg)   SpO2 98%   BMI 41.79 kg/m       Objective:   Physical Exam  Constitutional: He is oriented to person, place, and time. He appears well-developed and well-nourished. No distress.  HENT:  Head: Normocephalic  and atraumatic.  Cardiovascular: Normal rate and regular rhythm.   No murmur heard. Pulmonary/Chest: Effort normal and breath sounds normal. No respiratory distress. He has no wheezes. He has no rales.  Musculoskeletal: He exhibits no edema.  Neurological: He is alert and oriented to person, place, and time.  Skin: Skin is warm and dry.  Psychiatric: He has a normal mood and affect. His behavior is normal. Thought content normal.          Assessment & Plan:

## 2017-06-17 ENCOUNTER — Telehealth: Payer: Self-pay | Admitting: *Deleted

## 2017-06-17 ENCOUNTER — Ambulatory Visit: Payer: BLUE CROSS/BLUE SHIELD | Admitting: Family

## 2017-06-17 ENCOUNTER — Telehealth: Payer: Self-pay | Admitting: Family

## 2017-06-17 NOTE — Telephone Encounter (Signed)
Received FMLA/STD paperwork from [employer]; completed as much as possible; forwarded to provider/SLS 10/26

## 2017-06-17 NOTE — Telephone Encounter (Signed)
[facility] sent him OLMESART-AMINO-HC 40/10/12.5  pt does not know what this is or what it is for. Please call pt and advise.

## 2017-06-20 NOTE — Telephone Encounter (Signed)
Jeremy [name]-- looks like this Rx shows in medication history from 06/13/17 but was cancelled / D/c'd. Pt still received med from mail order. I did find the fax confirmation where we cancelled both the escitalopram and the tribenzor. I confirmed with CSR, Tonya at [facility] that both Rxs were cancelled but the Olmesartan was still shipped. She states pt may contact them and ask for a reimbursement form since he was shipped / charged for the medication and it was supposed to have been cancelled. Notified pt and he voices understanding. States he still has enough Lotrel on hand and doesn't need it sent at this time.

## 2017-06-21 NOTE — Telephone Encounter (Signed)
Noted  

## 2017-07-01 ENCOUNTER — Encounter: Payer: Self-pay | Admitting: Family

## 2017-07-01 ENCOUNTER — Ambulatory Visit: Payer: BLUE CROSS/BLUE SHIELD | Admitting: Family

## 2017-07-01 VITALS — BP 144/90 | HR 79 | Temp 98.3°F | Resp 18 | Ht 67.0 in | Wt 263.2 lb

## 2017-07-01 DIAGNOSIS — F418 Other specified anxiety disorders: Secondary | ICD-10-CM | POA: Diagnosis not present

## 2017-07-01 MED ORDER — HYDROCHLOROTHIAZIDE 25 MG PO TABS: 25.0000 mg | ORAL_TABLET | Freq: Every day | ORAL | 1 refills | Status: DC

## 2017-07-01 MED ORDER — FLUOXETINE HCL 20 MG PO TABS: 20.0000 mg | ORAL_TABLET | Freq: Every day | ORAL | 3 refills | Status: DC

## 2017-07-01 MED ORDER — ALPRAZOLAM 0.5 MG PO TABS: 0.5000 mg | ORAL_TABLET | Freq: Every evening | ORAL | 0 refills | Status: DC | PRN

## 2017-07-01 NOTE — Patient Instructions (Signed)
Stop lexapro, begin prozac. You may use xanax as needed for panic attacks.   Please schedule an appointment with psychiatry. Psychiatric Services:  [facility], [place] [address] [place], [facility] [place], [place], [facility], [address], [place], [place]

## 2017-07-01 NOTE — Progress Notes (Signed)
Subjective:    [name], male    DOB: 11-23-1955, 61 y.o.   MRN: [id number]  HPI  Jeremy [name] is a 61 yr old male who presents today for follow up of his anxiety and depression.  Last visit he reported some improvement in his symptoms but did not feel ready to return to work. Reports that he had a panic attack last week on his way home from church.  This happened several other times last week as well. Does not feel that his is able to handle returning to work at this time.  Wt Readings from Last 3 Encounters:  07/01/17 263 lb 3.2 oz (119.4 kg)  06/13/17 266 lb 12.8 oz (121 kg)  05/31/17 259 lb 3.2 oz (117.6 kg)   HTN- Last visit we added metoprolol for his blood pressure. Reports that he is tolerating metoprolol without difficulty.   BP Readings from Last 3 Encounters:  07/01/17 (!) 155/81  06/13/17 (!) 149/92  05/31/17 139/80   Feels sleepy on lexapro.   He does have OSA declined cpap.     Review of Systems See HPI  Past Medical History:  Diagnosis Date  . Anemia 11/16/2014  . Bladder disease    "an unusual bladder disease; don't know what it's called" (06/30/2015)  . Bulging lumbar disc   . GERD (gastroesophageal reflux disease)   . History of hiatal hernia   . Hyperglycemia 11/16/2014  . Hyperlipemia   . Hypertension   . Joint pain, knee   . Migraine    06/30/2015 "maybe 2-3 times/year; not as bad as I used to have them"  . OSA (obstructive sleep apnea)    "dr took me off the mask; told me to lose weight" (06/30/2015)     Social History   Socioeconomic History  . Marital status: Married    Spouse name: Not on file  . Number of children: 3  . Years of education: Not on file  . Highest education level: Not on file  Social Needs  . Financial resource strain: Not on file  . Food insecurity - worry: Not on file  . Food insecurity - inability: Not on file  . Transportation needs - medical: Not on file  . Transportation needs - non-medical: Not on  file  Occupational History  . Not on file  Tobacco Use  . Smoking status: Former Smoker    Types: Cigarettes  . Smokeless tobacco: Never Used  . Tobacco comment: "stopped smoking in ~ 1990"  Substance and Sexual Activity  . Alcohol use: No    Alcohol/week: 0.0 oz  . Drug use: No  . Sexual activity: Yes  Other Topics Concern  . 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 ESOPHAGOGASTRODUODENOSCOPY    . ESOPHAGOGASTRODUODENOSCOPY (EGD) WITH ESOPHAGEAL DILATION  ~ 2014  . KNEE ARTHROSCOPY Right 1995  . NASAL SEPTUM SURGERY  ~ 1972  . 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 Stroke Mother 83       Died of CVA  . Hypertension Mother   . Anesthesia problems Neg Hx   . Hypotension Neg Hx   . Malignant hyperthermia Neg Hx   .  Pseudochol deficiency Neg Hx     No Known Allergies  Current Outpatient Medications on File Prior to Visit  Medication Sig Dispense Refill  . amLODipine-benazepril (LOTREL) 10-40 MG capsule Take 1 capsule by mouth daily.    Marland Kitchen b complex vitamins tablet Take 1 tablet by mouth daily.    . ferrous sulfate 325 (65 FE) MG tablet Take 1 tablet (325 mg total) by mouth 2 (two) times daily with a meal. 60 tablet 0  . fexofenadine (ALLEGRA) 180 MG tablet Take 180 mg by mouth daily.    . metoprolol succinate (TOPROL-XL) 50 MG 24 hr tablet Take 1 tablet (50 mg total) by mouth daily. Take with or immediately following a meal. 30 tablet 3  . Multiple Vitamin (MULTIVITAMIN WITH MINERALS) TABS tablet Take 1 tablet by mouth daily.    Marland Kitchen omeprazole (PRILOSEC) 40 MG capsule Take 1 capsule (40 mg total) by mouth daily. 90 capsule 0  . potassium chloride (K-DUR,KLOR-CON) 10 MEQ tablet TAKE 2 TABLETS DAILY 180 tablet 1    No current facility-administered medications on file prior to visit.     BP (!) 155/81 (BP Location: Right Arm, Cuff Size: Large)   Pulse 79   Temp 98.3 F (36.8 C) (Oral)   Resp 18   Ht 5\' 7"  (1.702 m)   Wt 263 lb 3.2 oz (119.4 kg)   SpO2 97%   BMI 41.22 kg/m       Objective:   Physical Exam  Constitutional: He is oriented to person, place, and time. He appears well-developed and well-nourished. No distress.  Neurological: He is alert and oriented to person, place, and time.  Psychiatric: His behavior is normal. Judgment and thought content normal.  Mildly anxious appearing          Assessment & Plan:  Anxiety/depression-will discontinue Lexapro 10 mg and transition to Prozac 20 mg.  Since Prozac is less sedating am hopeful he will be able to tolerate higher doses.  I will also add Xanax on an as-needed basis.  We discussed using this sparingly as needed for panic attacks.  A controlled substance contract is signed today.  He understands not to take the Xanax prior to driving or operating machinery.  I have advised him to stay out of work for the next 2 weeks.  He is working with a Social worker.  I have also advised him to make an appointment with psychiatry. I did discuss with the patient that untreated sleep apnea may be contributing to his daytime sleepiness. This is especially true in light of his recent weight  gain.  HTN-  BP Readings from Last 3 Encounters:  07/01/17 (!) 144/90  06/13/17 (!) 149/92  05/31/17 139/80   Fair BP today. Recheck next visit. If still above goal plan to increase beta blocker.    A total of 25  minutes were spent face-to-face with the patient during this encounter and over half of that time was spent on counseling and coordination of care. The patient was counseled on anxiety/depression.

## 2017-07-08 NOTE — Telephone Encounter (Signed)
Faxed 06/22/17.Marland KitchenSLS

## 2017-07-12 ENCOUNTER — Telehealth: Payer: Self-pay | Admitting: *Deleted

## 2017-07-12 NOTE — Telephone Encounter (Signed)
Received request for medical records for FMLA/STD from [facility], dated 07/04/17 and after, forwarded last visit information from 07/01/17 with note that patient is due back in office on 07/18/17 to [facility] via fax/SLS 11/20

## 2017-07-17 NOTE — Progress Notes (Signed)
Subjective:    [name], male    DOB: 12/28/1955, 61 y.o.   MRN: [id number]  HPI   Mr. Keyworth is a 61 yr old male who presents today for follow up of his depression/anxiety. Last visit we d/c'd his lexapro due to sedation and placed him on prozac.  Reports that he is a little less sleepy on the prozac.  Feels like it is working a little better.  We also added xanax on a prn basis.  He continues to work with a Social worker.  Has an appointment with Dr. Clovis Pu to re-establish (psychiatry). Reports that he had a panic attack the other day on the way to his counseling appointment. Does not feel ready to return to work.    HTN-  Last visit BP was above goal.  He is maintained on hctz 25mg , toprol xl 50mg , lotrel 10-40.  BP Readings from Last 3 Encounters:  07/18/17 (!) 144/94  07/01/17 (!) 144/90  06/13/17 (!) 149/92    Review of Systems See HPI  Past Medical History:  Diagnosis Date  . Anemia 11/16/2014  . Bladder disease    "an unusual bladder disease; don't know what it's called" (06/30/2015)  . Bulging lumbar disc   . GERD (gastroesophageal reflux disease)   . History of hiatal hernia   . Hyperglycemia 11/16/2014  . Hyperlipemia   . Hypertension   . Joint pain, knee   . Migraine    06/30/2015 "maybe 2-3 times/year; not as bad as I used to have them"  . OSA (obstructive sleep apnea)    "dr took me off the mask; told me to lose weight" (06/30/2015)     Social History   Socioeconomic History  . Marital status: Married    Spouse name: Not on file  . Number of children: 3  . Years of education: Not on file  . Highest education level: Not on file  Social Needs  . Financial resource strain: Not on file  . Food insecurity - worry: Not on file  . Food insecurity - inability: Not on file  . Transportation needs - medical: Not on file  . Transportation needs - non-medical: Not on file  Occupational History  . Not on file  Tobacco Use  . Smoking status: Former  Smoker    Types: Cigarettes  . Smokeless tobacco: Never Used  . Tobacco comment: "stopped smoking in ~ 1990"  Substance and Sexual Activity  . Alcohol use: No    Alcohol/week: 0.0 oz  . Drug use: No  . Sexual activity: Yes  Other Topics Concern  . 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 ESOPHAGOGASTRODUODENOSCOPY    . ESOPHAGOGASTRODUODENOSCOPY (EGD) WITH ESOPHAGEAL DILATION  ~ 2014  . KNEE ARTHROSCOPY Right 1995  . NASAL SEPTUM SURGERY  ~ 1972  . TOTAL KNEE ARTHROPLASTY  10/11/2011   Procedure: TOTAL KNEE ARTHROPLASTY;  Surgeon: Rudean Haskell, MD;  Location: [facility];  Service: Orthopedics;  Laterality: Right;  . 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 Stroke Mother 61       Died of CVA  . Hypertension Mother   . Anesthesia problems Neg Hx   .  Hypotension Neg Hx   . Malignant hyperthermia Neg Hx   . Pseudochol deficiency Neg Hx     No Known Allergies  Current Outpatient Medications on File Prior to Visit  Medication Sig Dispense Refill  . ALPRAZolam (XANAX) 0.5 MG tablet Take 1 tablet (0.5 mg total) at bedtime as needed by mouth for anxiety. 20 tablet 0  . amLODipine-benazepril (LOTREL) 10-40 MG capsule Take 1 capsule by mouth daily.    Marland Kitchen b complex vitamins tablet Take 1 tablet by mouth daily.    . ferrous sulfate 325 (65 FE) MG tablet Take 1 tablet (325 mg total) by mouth 2 (two) times daily with a meal. 60 tablet 0  . fexofenadine (ALLEGRA) 180 MG tablet Take 180 mg by mouth daily.    Marland Kitchen FLUoxetine (PROZAC) 20 MG tablet Take 1 tablet (20 mg total) daily by mouth. 30 tablet 3  . hydrochlorothiazide (HYDRODIURIL) 25 MG tablet Take 1 tablet (25 mg total) daily by mouth. 90 tablet 1  . metoprolol succinate  (TOPROL-XL) 50 MG 24 hr tablet Take 1 tablet (50 mg total) by mouth daily. Take with or immediately following a meal. 30 tablet 3  . Multiple Vitamin (MULTIVITAMIN WITH MINERALS) TABS tablet Take 1 tablet by mouth daily.    Marland Kitchen omeprazole (PRILOSEC) 40 MG capsule Take 1 capsule (40 mg total) by mouth daily. 90 capsule 0  . potassium chloride (K-DUR,KLOR-CON) 10 MEQ tablet TAKE 2 TABLETS DAILY 180 tablet 1   No current facility-administered medications on file prior to visit.     BP (!) 147/89 (BP Location: Left Arm, Patient Position: Sitting, Cuff Size: Large)   Pulse 72   Temp 98.1 F (36.7 C)   Resp 16   Ht 5\' 7"  (1.702 m)   Wt 264 lb 9.6 oz (120 kg)   SpO2 93%   BMI 41.44 kg/m       Objective:   Physical Exam  Constitutional: He is oriented to person, place, and time. He appears well-developed and well-nourished. No distress.  HENT:  Head: Normocephalic and atraumatic.  Cardiovascular: Normal rate and regular rhythm.  No murmur heard. Pulmonary/Chest: Effort normal and breath sounds normal. No respiratory distress. He has no wheezes. He has no rales.  Musculoskeletal: He exhibits no edema.  Neurological: He is alert and oriented to person, place, and time.  Skin: Skin is warm and dry.  Psychiatric: He has a normal mood and affect. His behavior is normal. Thought content normal.          Assessment & Plan:  Anxiety/depression- slowly improving.  Advised pt that I will defer his treatment going forward to his psychiatrist. I gave him a note for work for 2 weeks additional out of work but advised that future time out of work should be authorized by his psychiatrist.  HTN- uncontrolled. Will increase toprol xl from 50mg  ot 100mg .

## 2017-07-18 ENCOUNTER — Ambulatory Visit: Payer: BLUE CROSS/BLUE SHIELD | Admitting: Family

## 2017-07-18 ENCOUNTER — Encounter: Payer: Self-pay | Admitting: Family

## 2017-07-18 VITALS — BP 144/94 | HR 72 | Temp 98.1°F | Resp 16 | Ht 67.0 in | Wt 264.6 lb

## 2017-07-18 DIAGNOSIS — I10 Essential (primary) hypertension: Secondary | ICD-10-CM

## 2017-07-18 DIAGNOSIS — F418 Other specified anxiety disorders: Secondary | ICD-10-CM

## 2017-07-18 MED ORDER — METOPROLOL SUCCINATE ER 100 MG PO TB24: 100.0000 mg | ORAL_TABLET | Freq: Every day | ORAL | 2 refills | Status: DC

## 2017-07-18 NOTE — Patient Instructions (Signed)
Please keep your upcoming appointment with Dr. Clovis Pu.  Increase Toprol xl from 50mg  to 100mg  once daily.

## 2017-07-19 DIAGNOSIS — F329 Major depressive disorder, single episode, unspecified: Secondary | ICD-10-CM | POA: Diagnosis not present

## 2017-07-19 DIAGNOSIS — F410 Panic disorder [episodic paroxysmal anxiety] without agoraphobia: Secondary | ICD-10-CM | POA: Diagnosis not present

## 2017-07-25 ENCOUNTER — Telehealth: Payer: Self-pay | Admitting: *Deleted

## 2017-08-05 NOTE — Telephone Encounter (Signed)
Forms completed and signed by PCP and faxed to [phone].

## 2017-08-07 ENCOUNTER — Other Ambulatory Visit: Payer: Self-pay | Admitting: Family

## 2017-08-18 DIAGNOSIS — H35033 Hypertensive retinopathy, bilateral: Secondary | ICD-10-CM | POA: Diagnosis not present

## 2017-08-18 DIAGNOSIS — H3561 Retinal hemorrhage, right eye: Secondary | ICD-10-CM | POA: Diagnosis not present

## 2017-08-22 ENCOUNTER — Encounter: Payer: Self-pay | Admitting: Family

## 2017-08-22 ENCOUNTER — Ambulatory Visit: Payer: BLUE CROSS/BLUE SHIELD | Admitting: Family

## 2017-08-22 VITALS — BP 118/70 | HR 78 | Temp 98.4°F | Resp 16 | Ht 67.0 in | Wt 264.0 lb

## 2017-08-22 DIAGNOSIS — I10 Essential (primary) hypertension: Secondary | ICD-10-CM | POA: Diagnosis not present

## 2017-08-22 DIAGNOSIS — R739 Hyperglycemia, unspecified: Secondary | ICD-10-CM

## 2017-08-22 DIAGNOSIS — F418 Other specified anxiety disorders: Secondary | ICD-10-CM

## 2017-08-22 DIAGNOSIS — D509 Iron deficiency anemia, unspecified: Secondary | ICD-10-CM

## 2017-08-22 LAB — CBC WITH DIFFERENTIAL/PLATELET
BASOS ABS: 0.1 10*3/uL (ref 0.0–0.1)
Basophils Relative: 0.7 % (ref 0.0–3.0)
EOS ABS: 0.2 10*3/uL (ref 0.0–0.7)
Eosinophils Relative: 2.7 % (ref 0.0–5.0)
HEMATOCRIT: 43.7 % (ref 39.0–52.0)
HEMOGLOBIN: 14.7 g/dL (ref 13.0–17.0)
LYMPHS PCT: 24.6 % (ref 12.0–46.0)
Lymphs Abs: 1.9 10*3/uL (ref 0.7–4.0)
MCHC: 33.6 g/dL (ref 30.0–36.0)
MCV: 91.8 fl (ref 78.0–100.0)
Monocytes Absolute: 0.8 10*3/uL (ref 0.1–1.0)
Monocytes Relative: 10.5 % (ref 3.0–12.0)
Neutro Abs: 4.7 10*3/uL (ref 1.4–7.7)
Neutrophils Relative %: 61.5 % (ref 43.0–77.0)
PLATELETS: 269 10*3/uL (ref 150.0–400.0)
RBC: 4.76 Mil/uL (ref 4.22–5.81)
RDW: 13.9 % (ref 11.5–15.5)
WBC: 7.6 10*3/uL (ref 4.0–10.5)

## 2017-08-22 LAB — BASIC METABOLIC PANEL
BUN: 20 mg/dL (ref 6–23)
CALCIUM: 8.9 mg/dL (ref 8.4–10.5)
CHLORIDE: 103 meq/L (ref 96–112)
CO2: 30 meq/L (ref 19–32)
Creatinine, Ser: 0.94 mg/dL (ref 0.40–1.50)
GFR: 86.22 mL/min (ref >60.00)
Glucose, Bld: 103 mg/dL — ABNORMAL HIGH (ref 70–99)
Potassium: 3.7 mEq/L (ref 3.5–5.1)
SODIUM: 140 meq/L (ref 135–145)

## 2017-08-22 LAB — IRON: Iron: 77 ug/dL (ref 42–165)

## 2017-08-22 LAB — HEMOGLOBIN A1C: HEMOGLOBIN A1C: 5.8 % (ref 4.6–6.5)

## 2017-08-22 NOTE — Progress Notes (Signed)
Subjective:    [name], male    DOB: 12/10/1955, 61 y.o.   MRN: [id number]  HPI   Mr. Jeremy [name] is a 61 yr old male who presents today for follow up of his depression/anxiety.  Reports that he is feeling better but not ready to go back to work. Continues to see his counselor which he finds helpful.    HTN- last bisit we increased is toprol xl from 50mg  to 100mg .  BP Readings from Last 3 Encounters:  08/22/17 118/70  07/18/17 (!) 144/94  07/01/17 (!) 144/90     Review of Systems See HPI  Past Medical History:  Diagnosis Date  . Anemia 11/16/2014  . Bladder disease    "an unusual bladder disease; don't know what it's called" (06/30/2015)  . Bulging lumbar disc   . GERD (gastroesophageal reflux disease)   . History of hiatal hernia   . Hyperglycemia 11/16/2014  . Hyperlipemia   . Hypertension   . Joint pain, knee   . Migraine    06/30/2015 "maybe 2-3 times/year; not as bad as I used to have them"  . OSA (obstructive sleep apnea)    "dr took me off the mask; told me to lose weight" (06/30/2015)     Social History   Socioeconomic History  . Marital status: Married    Spouse name: Not on file  . Number of children: 3  . Years of education: Not on file  . Highest education level: Not on file  Social Needs  . Financial resource strain: Not on file  . Food insecurity - worry: Not on file  . Food insecurity - inability: Not on file  . Transportation needs - medical: Not on file  . Transportation needs - non-medical: Not on file  Occupational History  . Not on file  Tobacco Use  . Smoking status: Former Smoker    Types: Cigarettes  . Smokeless tobacco: Never Used  . Tobacco comment: "stopped smoking in ~ 1990"  Substance and Sexual Activity  . Alcohol use: No    Alcohol/week: 0.0 oz  . Drug use: No  . Sexual activity: Yes  Other Topics Concern  . 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 ESOPHAGOGASTRODUODENOSCOPY    . ESOPHAGOGASTRODUODENOSCOPY (EGD) WITH ESOPHAGEAL DILATION  ~ 2014  . KNEE ARTHROSCOPY Right 1995  . NASAL SEPTUM SURGERY  ~ 1972  . TOTAL KNEE ARTHROPLASTY  10/11/2011   Procedure: TOTAL KNEE ARTHROPLASTY;  Surgeon: Rudean Haskell, MD;  Location: [place];  Service: Orthopedics;  Laterality: Right;  . 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 Stroke Mother 46       Died of CVA  . Hypertension Mother   . Anesthesia problems Neg Hx   . Hypotension Neg Hx   . Malignant hyperthermia Neg Hx   . Pseudochol deficiency Neg Hx     No Known Allergies  Current Outpatient Medications on File Prior to Visit  Medication Sig Dispense Refill  . ALPRAZolam (XANAX) 0.5 MG tablet Take 1 tablet (0.5 mg total) at bedtime as needed by mouth for anxiety. 20 tablet 0  . amLODipine-benazepril (LOTREL) 10-40 MG capsule  Take 1 capsule by mouth daily.    Marland Kitchen b complex vitamins tablet Take 1 tablet by mouth daily.    . ferrous sulfate 325 (65 FE) MG tablet Take 1 tablet (325 mg total) by mouth 2 (two) times daily with a meal. 60 tablet 0  . fexofenadine (ALLEGRA) 180 MG tablet Take 180 mg by mouth daily.    . hydrochlorothiazide (HYDRODIURIL) 25 MG tablet Take 1 tablet (25 mg total) daily by mouth. 90 tablet 1  . metoprolol succinate (TOPROL XL) 100 MG 24 hr tablet Take 1 tablet (100 mg total) by mouth daily. Take with or immediately following a meal. 30 tablet 2  . Multiple Vitamin (MULTIVITAMIN WITH MINERALS) TABS tablet Take 1 tablet by mouth daily.    Marland Kitchen omeprazole (PRILOSEC) 40 MG capsule Take 1 capsule (40 mg total) by mouth daily. 90 capsule 0  . potassium chloride (K-DUR,KLOR-CON) 10 MEQ tablet TAKE 2 TABLETS DAILY 180 tablet 1   No current facility-administered medications  on file prior to visit.     BP 118/70 (BP Location: Right Arm, Patient Position: Sitting, Cuff Size: Large)   Pulse 78   Temp 98.4 F (36.9 C) (Oral)   Resp 16   Ht 5\' 7"  (1.702 m)   Wt 264 lb (119.7 kg)   SpO2 98%   BMI 41.35 kg/m       Objective:   Physical Exam  Constitutional: He is oriented to person, place, and time. He appears well-developed and well-nourished. No distress.  HENT:  Head: Normocephalic and atraumatic.  Cardiovascular: Normal rate and regular rhythm.  No murmur heard. Pulmonary/Chest: Effort normal and breath sounds normal. No respiratory distress. He has no wheezes. He has no rales.  Musculoskeletal: He exhibits no edema.  Neurological: He is alert and oriented to person, place, and time.  Skin: Skin is warm and dry.  Psychiatric: He has a normal mood and affect. His behavior is normal. Thought content normal.          Assessment & Plan:  HTN- improved, continue current meds.  Depression/anxiety- Improving. He is advised to call us with his current medication list as he cannot remember the changes which were made by his psychiatrist.  Hyperglycemia- will obtain A1C.    Iron def anemia- check iron/cbc.

## 2017-08-22 NOTE — Patient Instructions (Signed)
Please complete lab work prior to leaving. Continue your follow up with Dr. Clovis Pu.

## 2017-08-24 DIAGNOSIS — F410 Panic disorder [episodic paroxysmal anxiety] without agoraphobia: Secondary | ICD-10-CM | POA: Diagnosis not present

## 2017-09-03 ENCOUNTER — Encounter: Payer: Self-pay | Admitting: Family

## 2017-09-16 ENCOUNTER — Encounter: Payer: Self-pay | Admitting: Family

## 2017-09-27 DIAGNOSIS — F410 Panic disorder [episodic paroxysmal anxiety] without agoraphobia: Secondary | ICD-10-CM | POA: Diagnosis not present

## 2017-10-12 ENCOUNTER — Ambulatory Visit: Payer: Self-pay | Admitting: *Deleted

## 2017-10-12 ENCOUNTER — Ambulatory Visit (HOSPITAL_BASED_OUTPATIENT_CLINIC_OR_DEPARTMENT_OTHER)
Admission: RE | Admit: 2017-10-12 | Discharge: 2017-10-12 | Disposition: A | Discharge status: Home / Self Care | Payer: BLUE CROSS/BLUE SHIELD | Source: Ambulatory Visit | Attending: Medical | Admitting: Medical

## 2017-10-12 ENCOUNTER — Ambulatory Visit: Payer: BLUE CROSS/BLUE SHIELD | Admitting: Medical

## 2017-10-12 ENCOUNTER — Encounter: Payer: Self-pay | Admitting: Medical

## 2017-10-12 VITALS — BP 120/70 | HR 71 | Temp 97.7°F | Resp 16 | Ht 67.0 in | Wt 262.2 lb

## 2017-10-12 DIAGNOSIS — S299XXA Unspecified injury of thorax, initial encounter: Secondary | ICD-10-CM | POA: Diagnosis not present

## 2017-10-12 DIAGNOSIS — R059 Cough, unspecified: Secondary | ICD-10-CM

## 2017-10-12 DIAGNOSIS — N529 Male erectile dysfunction, unspecified: Secondary | ICD-10-CM | POA: Diagnosis not present

## 2017-10-12 DIAGNOSIS — R05 Cough: Secondary | ICD-10-CM

## 2017-10-12 DIAGNOSIS — J40 Bronchitis, not specified as acute or chronic: Secondary | ICD-10-CM | POA: Diagnosis not present

## 2017-10-12 DIAGNOSIS — R0781 Pleurodynia: Secondary | ICD-10-CM

## 2017-10-12 DIAGNOSIS — R062 Wheezing: Secondary | ICD-10-CM

## 2017-10-12 MED ORDER — AMLODIPINE BESY-BENAZEPRIL HCL 10-40 MG PO CAPS: 1.0000 | ORAL_CAPSULE | Freq: Every day | ORAL | Status: DC

## 2017-10-12 MED ORDER — SILDENAFIL CITRATE 50 MG PO TABS: 50.0000 mg | ORAL_TABLET | Freq: Every day | ORAL | 0 refills | Status: DC | PRN

## 2017-10-12 MED ORDER — ALBUTEROL SULFATE HFA 108 (90 BASE) MCG/ACT IN AERS: 2.0000 | INHALATION_SPRAY | Freq: Four times a day (QID) | RESPIRATORY_TRACT | 2 refills | Status: DC | PRN

## 2017-10-12 MED ORDER — AMLODIPINE BESY-BENAZEPRIL HCL 10-40 MG PO CAPS: 1.0000 | ORAL_CAPSULE | Freq: Every day | ORAL | 3 refills | Status: DC

## 2017-10-12 MED ORDER — DOXYCYCLINE HYCLATE 100 MG PO TABS: 100.0000 mg | ORAL_TABLET | Freq: Two times a day (BID) | ORAL | 0 refills | Status: DC

## 2017-10-12 MED ORDER — BENZONATATE 100 MG PO CAPS: 100.0000 mg | ORAL_CAPSULE | Freq: Three times a day (TID) | ORAL | 0 refills | Status: DC | PRN

## 2017-10-12 NOTE — Patient Instructions (Addendum)
For your recent fall and right rib pain, we will do x-ray of the rib region and see if you have a fracture.  Then decide on what medication given for pain.  You might get by with just low dose ibuprofen or Tylenol provided no fracture seen.  For recent productive cough, we will get a chest x-ray to see if you have any pneumonia.  If no pneumonia seen then we will treat you for bronchitis.  Prescription of doxycycline antibiotic written today.  For cough prescription of benzonatate.  For wheezing making albuterol inhaler available.  If you are using albuterol frequently such as every 6 hours please let us know.  In that event would need to add additional medication.  For erectal dysfunction, I prescribed generic Viagra.  Rx advisement given.  Refill of your Lotrel pain medication sent to your mail order pharmacy.  Please review your dose that you have at home and verify that it was the 10/40 that you are using.  Follow-up in 7 days or as needed.

## 2017-10-12 NOTE — Progress Notes (Signed)
Subjective:    [name], male    DOB: 1956-01-20, 62 y.o.   MRN: [id number]  HPI  Pt in stating about 2 weeks ago he fell and hit his ribs. He states he landed on parking block. He had bruise in that area initially. Over past 2 weeks has been coughing and bringing up mucus. He was coughing and had wheezed yesterday playing golf. No hx of asthma. He may have smoked about 6 years about 30 years ago.   No fever, no chills or sweats.     Review of Systems  Constitutional: Negative for chills, fatigue and fever.  HENT: Negative for congestion and ear discharge.   Respiratory: Positive for cough and wheezing. Negative for chest tightness and shortness of breath.        Wheezing some yesterday.  Cardiovascular: Negative for chest pain and palpitations.  Gastrointestinal: Negative for abdominal pain.  Genitourinary:       Hx of ED. 2-3 weeks. No MI history or use of ntiro. (Final complaint he brought up his wrapping of.)  Musculoskeletal: Negative for back pain, myalgias and neck stiffness.  Skin: Negative for rash.  Neurological: Negative for dizziness and headaches.  Hematological: Negative for adenopathy. Does not bruise/bleed easily.  Psychiatric/Behavioral: Negative for behavioral problems, confusion and dysphoric mood. The patient is not nervous/anxious.     Past Medical History:  Diagnosis Date  . Anemia 11/16/2014  . Bladder disease    "an unusual bladder disease; don't know what it's called" (06/30/2015)  . Bulging lumbar disc   . GERD (gastroesophageal reflux disease)   . History of hiatal hernia   . Hyperglycemia 11/16/2014  . Hyperlipemia   . Hypertension   . Joint pain, knee   . Migraine    06/30/2015 "maybe 2-3 times/year; not as bad as I used to have them"  . OSA (obstructive sleep apnea)    "dr took me off the mask; told me to lose weight" (06/30/2015)     Social History   Socioeconomic History  . Marital status: Married    Spouse name: Not on  file  . Number of children: 3  . Years of education: Not on file  . Highest education level: Not on file  Social Needs  . Financial resource strain: Not on file  . Food insecurity - worry: Not on file  . Food insecurity - inability: Not on file  . Transportation needs - medical: Not on file  . Transportation needs - non-medical: Not on file  Occupational History  . Not on file  Tobacco Use  . Smoking status: Former Smoker    Types: Cigarettes  . Smokeless tobacco: Never Used  . Tobacco comment: "stopped smoking in ~ 1990"  Substance and Sexual Activity  . Alcohol use: No    Alcohol/week: 0.0 oz  . Drug use: No  . Sexual activity: Yes  Other Topics Concern  . 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 ESOPHAGOGASTRODUODENOSCOPY    . ESOPHAGOGASTRODUODENOSCOPY (EGD) WITH ESOPHAGEAL DILATION  ~ 2014  . KNEE ARTHROSCOPY Right 1995  . NASAL SEPTUM SURGERY  ~ 1972  . TOTAL KNEE ARTHROPLASTY  10/11/2011   Procedure: TOTAL KNEE ARTHROPLASTY;  Surgeon: Rudean Haskell, MD;  Location: [facility];  Service: Orthopedics;  Laterality: Right;  . 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 Stroke Mother 34       Died of CVA  . Hypertension Mother   . Anesthesia problems Neg Hx   . Hypotension Neg Hx   . Malignant hyperthermia Neg Hx   . Pseudochol deficiency Neg Hx     No Known Allergies  Current Outpatient Medications on File Prior to Visit  Medication Sig Dispense Refill  . ALPRAZolam (XANAX) 0.5 MG tablet Take 1 tablet (0.5 mg total) at bedtime as needed by mouth for anxiety. 20 tablet 0  . b complex vitamins tablet Take 1 tablet by mouth daily.    . ferrous sulfate 325 (65 FE) MG tablet Take 1 tablet (325 mg total) by mouth 2  (two) times daily with a meal. 60 tablet 0  . fexofenadine (ALLEGRA) 180 MG tablet Take 180 mg by mouth daily.    . hydrochlorothiazide (HYDRODIURIL) 25 MG tablet Take 1 tablet (25 mg total) daily by mouth. 90 tablet 1  . metoprolol succinate (TOPROL XL) 100 MG 24 hr tablet Take 1 tablet (100 mg total) by mouth daily. Take with or immediately following a meal. 30 tablet 2  . Multiple Vitamin (MULTIVITAMIN WITH MINERALS) TABS tablet Take 1 tablet by mouth daily.    Marland Kitchen omeprazole (PRILOSEC) 40 MG capsule Take 1 capsule (40 mg total) by mouth daily. 90 capsule 0  . potassium chloride (K-DUR,KLOR-CON) 10 MEQ tablet TAKE 2 TABLETS DAILY 180 tablet 1   No current facility-administered medications on file prior to visit.     BP 120/70   Pulse 71   Temp 97.7 F (36.5 C) (Oral)   Resp 16   Ht 5\' 7"  (1.702 m)   Wt 262 lb 3.2 oz (118.9 kg)   SpO2 95%   BMI 41.07 kg/m       Objective:   Physical Exam  General  Mental Status - Alert. General Appearance - Well groomed. Not in acute distress.  Skin Rashes- No Rashes.  HEENT Head- Normal. Ear Auditory Canal - Left- Normal. Right - Normal.Tympanic Membrane- Left- Normal. Right- Normal. Eye Sclera/Conjunctiva- Left- Normal. Right- Normal. Nose & Sinuses Nasal Mucosa- Left-  Boggy and Congested. Right-  Boggy and  Congested.Bilateral no maxillary and no frontal sinus pressure. Mouth & Throat Lips: Upper Lip- Normal: no dryness, cracking, pallor, cyanosis, or vesicular eruption. Lower Lip-Normal: no dryness, cracking, pallor, cyanosis or vesicular eruption. Buccal Mucosa- Bilateral- No Aphthous ulcers. Oropharynx- No Discharge or Erythema. Tonsils: Characteristics- Bilateral- No Erythema or Congestion. Size/Enlargement- Bilateral- No enlargement. Discharge- bilateral-None.  Neck Neck- Supple. No Masses.   Chest and Lung Exam Auscultation: Breath Sounds:-Clear even and unlabored.   Cardiovascular Auscultation:Rythm- Regular, rate and  rhythm. Murmurs & Other Heart Sounds:Ausculatation of the heart reveal- No Murmurs.  Lymphatic Head & Neck General Head & Neck Lymphatics: Bilateral: Description- No Localized lymphadenopathy.   Lower extremity-calves are symmetric, no pedal edema.  Negative Homans sign.       Assessment & Plan:  For your recent fall and right rib pain, we will do x-ray of the rib region and see if you have a fracture.  Then decide on what medication given for pain.  You might get by with just low dose ibuprofen or Tylenol provided no fracture seen.  For recent productive cough, we will get a chest x-ray to see if you  have any pneumonia.  If no pneumonia seen then we will treat you for bronchitis.  Prescription of doxycycline antibiotic written today.  For cough prescription of benzonatate  For wheezing making albuterol inhaler available.  If you are using albuterol frequently such as every 6 hours please let us know.  In that event would need to add additional medication.  For erectal dysfunction, I prescribed generic Viagra.  Rx advisement given.  Refill of your Lotrel pain medication sent to your mail order pharmacy.  Please review your dose that you have at home and verify that it was the 10/40 that you are using.  Follow-up in 7 days or as needed.  Mackie Pai, PA-C

## 2017-10-12 NOTE — Telephone Encounter (Signed)
Pt called stating that he has had some shortness of breath for about 2 weeks and has gotten increasingly worst. He stated that he had fallen at church just before the shortness of breath started. He fell on the right side which really hurt but also pain started on the left side after this.  He has found that when he is exerting himself it gets worst and interferes in his daily activity.  Home care advice given to patient. Appointment made per protocol for today.  Reason for Disposition . [1] MILD difficulty breathing (e.g., minimal/no SOB at rest, SOB with walking, pulse <100) AND [2] NEW-onset or WORSE than normal  Answer Assessment - Initial Assessment Questions 1. RESPIRATORY STATUS: "Describe your breathing?" (e.g., wheezing, shortness of breath, unable to speak, severe coughing)      Wheezing, shortness of breath 2. ONSET: "When did this breathing problem begin?"      2 weeks ago 3. PATTERN "Does the difficult breathing come and go, or has it been constant since it started?"      Comes and goes 4. SEVERITY: "How bad is your breathing?" (e.g., mild, moderate, severe)    - MILD: No SOB at rest, mild SOB with walking, speaks normally in sentences, can lay down, no retractions, pulse < 100.    - MODERATE: SOB at rest, SOB with minimal exertion and prefers to sit, cannot lie down flat, speaks in phrases, mild retractions, audible wheezing, pulse 100-120.    - SEVERE: Very SOB at rest, speaks in single words, struggling to breathe, sitting hunched forward, retractions, pulse > 120      moderate 5. RECURRENT SYMPTOM: "Have you had difficulty breathing before?" If so, ask: "When was the last time?" and "What happened that time?"      no 6. CARDIAC HISTORY: "Do you have any history of heart disease?" (e.g., heart attack, angina, bypass surgery, angioplasty)      Hx of chest pain = anxiety attacks 7. LUNG HISTORY: "Do you have any history of lung disease?"  (e.g., pulmonary embolus, asthma,  emphysema)     no 8. CAUSE: "What do you think is causing the breathing problem?"      Not sure 9. OTHER SYMPTOMS: "Do you have any other symptoms? (e.g., dizziness, runny nose, cough, chest pain, fever)     Yesterday was short of breath and began dizzy 10. PREGNANCY: "Is there any chance you are pregnant?" "When was your last menstrual period?"       no 11. TRAVEL: "Have you traveled out of the country in the last month?" (e.g., travel history, exposures)       no  Protocols used: BREATHING DIFFICULTY-A-AH

## 2017-10-12 NOTE — Telephone Encounter (Signed)
Pt scheduled w/ Edward today.  °

## 2017-10-18 ENCOUNTER — Ambulatory Visit: Payer: Self-pay | Admitting: *Deleted

## 2017-10-18 DIAGNOSIS — F410 Panic disorder [episodic paroxysmal anxiety] without agoraphobia: Secondary | ICD-10-CM | POA: Diagnosis not present

## 2017-10-18 NOTE — Telephone Encounter (Signed)
Noted and agree with disposition. 

## 2017-10-18 NOTE — Telephone Encounter (Signed)
Pt calling with complaints of shortness of breath that worsens with exertion. Pt was seen in the office on 2/20 for rib pain and was given medication and also experienced some wheezing at that time.Pt states he was starting to feel better after taking the medication for 2 days and then the symptoms continued. Pt states he feels like he is a little worse than he was at his original appt last week. Pt states he feels exhausted with activity. While on the phone with the nurse pt sounds SOB with talking and also could be heard having deep intakes of breath in order to finish his sentence. Pt also states he has some wheezing at rest. Pt advised to seek treatment in the ED due to current symptoms. Pt verbalized understanding and states he will go to [facility] ED to seek treatment.  Reason for Disposition . [1] MODERATE difficulty breathing (e.g., speaks in phrases, SOB even at rest, pulse 100-120) AND [2] NEW-onset or WORSE than normal  Answer Assessment - Initial Assessment Questions 1. RESPIRATORY STATUS: "Describe your breathing?" (e.g., wheezing, shortness of breath, unable to speak, severe coughing)      Short of breath with some wheezing at rest 2. ONSET: "When did this breathing problem begin?"      Began last week 3. PATTERN "Does the difficult breathing come and go, or has it been constant since it started?"      Pt states his shortness of breath worsens with exertion.  4. SEVERITY: "How bad is your breathing?" (e.g., mild, moderate, severe)    - MILD: No SOB at rest, mild SOB with walking, speaks normally in sentences, can lay down, no retractions, pulse < 100.    - MODERATE: SOB at rest, SOB with minimal exertion and prefers to sit, cannot lie down flat, speaks in phrases, mild retractions, audible wheezing, pulse 100-120.    - SEVERE: Very SOB at rest, speaks in single words, struggling to breathe, sitting hunched forward, retractions, pulse > 120      Pt states SOB worsens with exertion and  has some wheezing at rest 5. RECURRENT SYMPTOM: "Have you had difficulty breathing before?" If so, ask: "When was the last time?" and "What happened that time?"      Yes, recently seen in the office last week for similar symptoms 6. CARDIAC HISTORY: "Do you have any history of heart disease?" (e.g., heart attack, angina, bypass surgery, angioplasty)      Not assessed 7. LUNG HISTORY: "Do you have any history of lung disease?"  (e.g., pulmonary embolus, asthma, emphysema)     Asthma 8. CAUSE: "What do you think is causing the breathing problem?"      unsure 9. OTHER SYMPTOMS: "Do you have any other symptoms? (e.g., dizziness, runny nose, cough, chest pain, fever)     No complaints of other symptoms at this time.  10. PREGNANCY: "Is there any chance you are pregnant?" "When was your last menstrual period?"       n/a 11. TRAVEL: "Have you traveled out of the country in the last month?" (e.g., travel history, exposures)       Not assessed  Protocols used: BREATHING DIFFICULTY-A-AH

## 2017-10-18 NOTE — Telephone Encounter (Signed)
FYI

## 2017-10-19 ENCOUNTER — Ambulatory Visit: Payer: BLUE CROSS/BLUE SHIELD | Admitting: Family

## 2017-10-19 ENCOUNTER — Encounter: Payer: Self-pay | Admitting: Family

## 2017-10-19 VITALS — BP 138/62 | HR 71 | Temp 97.8°F | Resp 16 | Ht 67.0 in | Wt 268.0 lb

## 2017-10-19 DIAGNOSIS — R0602 Shortness of breath: Secondary | ICD-10-CM | POA: Diagnosis not present

## 2017-10-19 DIAGNOSIS — J209 Acute bronchitis, unspecified: Secondary | ICD-10-CM

## 2017-10-19 MED ORDER — PREDNISONE 10 MG PO TABS: ORAL_TABLET | ORAL | 0 refills | Status: DC

## 2017-10-19 MED ORDER — ALBUTEROL SULFATE (2.5 MG/3ML) 0.083% IN NEBU
2.5000 mg | INHALATION_SOLUTION | Freq: Once | RESPIRATORY_TRACT | Status: AC
  Administered 2017-10-19: 2.5 mg via RESPIRATORY_TRACT

## 2017-10-19 MED ORDER — METHYLPREDNISOLONE SODIUM SUCC 125 MG IJ SOLR
125.0000 mg | Freq: Once | INTRAMUSCULAR | Status: AC
  Administered 2017-10-19: 125 mg via INTRAMUSCULAR

## 2017-10-19 MED ORDER — ALBUTEROL SULFATE (2.5 MG/3ML) 0.083% IN NEBU: 2.5000 mg | INHALATION_SOLUTION | Freq: Four times a day (QID) | RESPIRATORY_TRACT | 1 refills | Status: DC | PRN

## 2017-10-19 NOTE — Progress Notes (Signed)
po

## 2017-10-19 NOTE — Patient Instructions (Signed)
Continue doxycycline until finished. Start prednisone taper tonight. Use albuterol nebulizer or inhaler every 6 hours for the next few days then as needed. Go to the ER if you develop severe/worsening shortness of breath. Let me know if your symptoms are not improved in 2-3 days.

## 2017-10-19 NOTE — Progress Notes (Signed)
Subjective:    [name], male    DOB: 03/24/1956, 62 y.o.   MRN: [id number]  HPI   Patient is a 62 year old male who presents today with chief complaint of shortness of breath.  He contacted nurse triage yesterday with same complaints and was advised to go to the emergency department.  Of note he was seen on October 12 2017 by another provider in our office with complaint of cough.  He apparently had fallen 2 weeks prior to that visit onto his ribs in the parking lot.  Chest x-ray was performed at that time which was negative for pneumonia or rib fractures.  It did note some diffuse peribronchial cuffing which was suggestive of acute bronchitis.  Reports that he continues to wheeze, increased SOB with exertion.  + cough.     Review of Systems See HPI  Past Medical History:  Diagnosis Date  . Anemia 11/16/2014  . Bladder disease    "an unusual bladder disease; don't know what it's called" (06/30/2015)  . Bulging lumbar disc   . GERD (gastroesophageal reflux disease)   . History of hiatal hernia   . Hyperglycemia 11/16/2014  . Hyperlipemia   . Hypertension   . Joint pain, knee   . Migraine    06/30/2015 "maybe 2-3 times/year; not as bad as I used to have them"  . OSA (obstructive sleep apnea)    "dr took me off the mask; told me to lose weight" (06/30/2015)     Social History   Socioeconomic History  . Marital status: Married    Spouse name: Not on file  . Number of children: 3  . Years of education: Not on file  . Highest education level: Not on file  Social Needs  . Financial resource strain: Not on file  . Food insecurity - worry: Not on file  . Food insecurity - inability: Not on file  . Transportation needs - medical: Not on file  . Transportation needs - non-medical: Not on file  Occupational History  . Not on file  Tobacco Use  . Smoking status: Former Smoker    Types: Cigarettes  . Smokeless tobacco: Never Used  . Tobacco comment: "stopped  smoking in ~ 1990"  Substance and Sexual Activity  . Alcohol use: No    Alcohol/week: 0.0 oz  . Drug use: No  . Sexual activity: Yes  Other Topics Concern  . 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 ESOPHAGOGASTRODUODENOSCOPY    . ESOPHAGOGASTRODUODENOSCOPY (EGD) WITH ESOPHAGEAL DILATION  ~ 2014  . KNEE ARTHROSCOPY Right 1995  . NASAL SEPTUM SURGERY  ~ 1972  . TOTAL KNEE ARTHROPLASTY  10/11/2011   Procedure: TOTAL KNEE ARTHROPLASTY;  Surgeon: Rudean Haskell, MD;  Location: [facility];  Service: Orthopedics;  Laterality: Right;  . 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 Stroke Mother 93       Died of CVA  . Hypertension Mother   . Anesthesia problems Neg Hx   . Hypotension Neg Hx   . Malignant hyperthermia Neg Hx   . Pseudochol deficiency Neg Hx     No Known Allergies  Current Outpatient  Medications on File Prior to Visit  Medication Sig Dispense Refill  . albuterol (PROVENTIL HFA;VENTOLIN HFA) 108 (90 Base) MCG/ACT inhaler Inhale 2 puffs into the lungs every 6 (six) hours as needed for wheezing or shortness of breath. 1 Inhaler 2  . ALPRAZolam (XANAX) 0.5 MG tablet Take 1 tablet (0.5 mg total) at bedtime as needed by mouth for anxiety. 20 tablet 0  . amLODipine-benazepril (LOTREL) 10-40 MG capsule Take 1 capsule by mouth daily. 90 capsule 3  . b complex vitamins tablet Take 1 tablet by mouth daily.    . benzonatate (TESSALON) 100 MG capsule Take 1 capsule (100 mg total) by mouth 3 (three) times daily as needed for cough. 30 capsule 0  . doxycycline (VIBRA-TABS) 100 MG tablet Take 1 tablet (100 mg total) by mouth 2 (two) times daily. Can give caps or generic 20 tablet 0  . ferrous sulfate 325 (65 FE) MG tablet Take 1  tablet (325 mg total) by mouth 2 (two) times daily with a meal. 60 tablet 0  . fexofenadine (ALLEGRA) 180 MG tablet Take 180 mg by mouth daily.    . hydrochlorothiazide (HYDRODIURIL) 25 MG tablet Take 1 tablet (25 mg total) daily by mouth. 90 tablet 1  . metoprolol succinate (TOPROL XL) 100 MG 24 hr tablet Take 1 tablet (100 mg total) by mouth daily. Take with or immediately following a meal. 30 tablet 2  . Multiple Vitamin (MULTIVITAMIN WITH MINERALS) TABS tablet Take 1 tablet by mouth daily.    Marland Kitchen omeprazole (PRILOSEC) 40 MG capsule Take 1 capsule (40 mg total) by mouth daily. 90 capsule 0  . potassium chloride (K-DUR,KLOR-CON) 10 MEQ tablet TAKE 2 TABLETS DAILY 180 tablet 1  . sildenafil (VIAGRA) 50 MG tablet Take 1 tablet (50 mg total) by mouth daily as needed for erectile dysfunction. 10 tablet 0   No current facility-administered medications on file prior to visit.     BP 138/62 (BP Location: Left Arm, Patient Position: Sitting, Cuff Size: Large)   Pulse 71   Temp 97.8 F (36.6 C) (Oral)   Resp 16   Ht 5\' 7"  (1.702 m)   Wt 268 lb (121.6 kg)   SpO2 97%   BMI 41.97 kg/m       Objective:   Physical Exam  General: Obese white male seated in chair. Cardiovascular: S1-S2, regular rate and rhythm Respiratory: Bilateral wheezing is noted.  No respiratory distress. Extremities: No significant lower extremity edema is noted Psychiatric: Calm,, pleasant Neuro: Moving all extremities, alert and oriented x3      Assessment & Plan:  Bronchitis with bronchospasm-patient was given an albuterol neb here today in the office.  He notes significant improvement in his symptoms.  He was given a dose of IM Solu-Medrol which will be followed by a prednisone taper.  He is advised to continue and complete the doxycycline prescription.  He is also advised to use albuterol MDI or nebulizer every 6 hours.  Handwritten prescription was provided for  a nebulizer with tubing.  A prescription was also sent  for albuterol nebs.  He is advised to follow-up in 1 week.

## 2017-10-21 ENCOUNTER — Emergency Department (HOSPITAL_BASED_OUTPATIENT_CLINIC_OR_DEPARTMENT_OTHER): Payer: BLUE CROSS/BLUE SHIELD

## 2017-10-21 ENCOUNTER — Encounter (HOSPITAL_BASED_OUTPATIENT_CLINIC_OR_DEPARTMENT_OTHER): Payer: Self-pay | Admitting: Emergency Medicine

## 2017-10-21 ENCOUNTER — Observation Stay (HOSPITAL_BASED_OUTPATIENT_CLINIC_OR_DEPARTMENT_OTHER)
Admission: EM | Admit: 2017-10-21 | Discharge: 2017-10-25 | DRG: 202 | Disposition: A | Discharge status: Home / Self Care | Payer: BLUE CROSS/BLUE SHIELD | Attending: Internal Medicine | Admitting: Internal Medicine

## 2017-10-21 ENCOUNTER — Other Ambulatory Visit: Payer: Self-pay

## 2017-10-21 DIAGNOSIS — Z96651 Presence of right artificial knee joint: Secondary | ICD-10-CM | POA: Diagnosis not present

## 2017-10-21 DIAGNOSIS — K219 Gastro-esophageal reflux disease without esophagitis: Secondary | ICD-10-CM | POA: Diagnosis not present

## 2017-10-21 DIAGNOSIS — F329 Major depressive disorder, single episode, unspecified: Secondary | ICD-10-CM

## 2017-10-21 DIAGNOSIS — I5033 Acute on chronic diastolic (congestive) heart failure: Secondary | ICD-10-CM | POA: Diagnosis not present

## 2017-10-21 DIAGNOSIS — E785 Hyperlipidemia, unspecified: Secondary | ICD-10-CM | POA: Diagnosis not present

## 2017-10-21 DIAGNOSIS — F32A Depression, unspecified: Secondary | ICD-10-CM | POA: Diagnosis present

## 2017-10-21 DIAGNOSIS — T380X5A Adverse effect of glucocorticoids and synthetic analogues, initial encounter: Secondary | ICD-10-CM | POA: Diagnosis present

## 2017-10-21 DIAGNOSIS — I10 Essential (primary) hypertension: Secondary | ICD-10-CM | POA: Diagnosis present

## 2017-10-21 DIAGNOSIS — I110 Hypertensive heart disease with heart failure: Secondary | ICD-10-CM | POA: Diagnosis present

## 2017-10-21 DIAGNOSIS — R05 Cough: Secondary | ICD-10-CM | POA: Diagnosis not present

## 2017-10-21 DIAGNOSIS — J9801 Acute bronchospasm: Secondary | ICD-10-CM | POA: Diagnosis present

## 2017-10-21 DIAGNOSIS — I272 Pulmonary hypertension, unspecified: Secondary | ICD-10-CM | POA: Diagnosis not present

## 2017-10-21 DIAGNOSIS — J45909 Unspecified asthma, uncomplicated: Principal | ICD-10-CM | POA: Diagnosis present

## 2017-10-21 DIAGNOSIS — R0609 Other forms of dyspnea: Secondary | ICD-10-CM | POA: Diagnosis present

## 2017-10-21 DIAGNOSIS — I5032 Chronic diastolic (congestive) heart failure: Secondary | ICD-10-CM | POA: Diagnosis present

## 2017-10-21 DIAGNOSIS — Z79899 Other long term (current) drug therapy: Secondary | ICD-10-CM | POA: Diagnosis not present

## 2017-10-21 DIAGNOSIS — R0602 Shortness of breath: Secondary | ICD-10-CM

## 2017-10-21 DIAGNOSIS — R079 Chest pain, unspecified: Secondary | ICD-10-CM | POA: Diagnosis not present

## 2017-10-21 DIAGNOSIS — R739 Hyperglycemia, unspecified: Secondary | ICD-10-CM | POA: Diagnosis present

## 2017-10-21 DIAGNOSIS — F418 Other specified anxiety disorders: Secondary | ICD-10-CM | POA: Diagnosis present

## 2017-10-21 DIAGNOSIS — G4733 Obstructive sleep apnea (adult) (pediatric): Secondary | ICD-10-CM | POA: Diagnosis not present

## 2017-10-21 DIAGNOSIS — Z87891 Personal history of nicotine dependence: Secondary | ICD-10-CM | POA: Diagnosis not present

## 2017-10-21 DIAGNOSIS — R0600 Dyspnea, unspecified: Secondary | ICD-10-CM | POA: Diagnosis present

## 2017-10-21 DIAGNOSIS — F419 Anxiety disorder, unspecified: Secondary | ICD-10-CM | POA: Diagnosis present

## 2017-10-21 DIAGNOSIS — R1030 Lower abdominal pain, unspecified: Secondary | ICD-10-CM | POA: Diagnosis not present

## 2017-10-21 LAB — CBC WITH DIFFERENTIAL/PLATELET
BASOS ABS: 0 10*3/uL (ref 0.0–0.1)
Basophils Relative: 0 %
EOS ABS: 0 10*3/uL (ref 0.0–0.7)
Eosinophils Relative: 0 %
HCT: 36.8 % — ABNORMAL LOW (ref 39.0–52.0)
HEMOGLOBIN: 11.9 g/dL — AB (ref 13.0–17.0)
LYMPHS ABS: 1.5 10*3/uL (ref 0.7–4.0)
Lymphocytes Relative: 12 %
MCH: 30.7 pg (ref 26.0–34.0)
MCHC: 32.3 g/dL (ref 30.0–36.0)
MCV: 95.1 fL (ref 78.0–100.0)
MONOS PCT: 4 %
Monocytes Absolute: 0.5 10*3/uL (ref 0.1–1.0)
NEUTROS ABS: 10.2 10*3/uL — AB (ref 1.7–7.7)
Neutrophils Relative %: 84 %
Platelets: 314 10*3/uL (ref 150–400)
RBC: 3.87 MIL/uL — AB (ref 4.22–5.81)
RDW: 14.4 % (ref 11.5–15.5)
WBC: 12.2 10*3/uL — AB (ref 4.0–10.5)

## 2017-10-21 LAB — COMPREHENSIVE METABOLIC PANEL
ALK PHOS: 74 U/L (ref 38–126)
ALT: 83 U/L — ABNORMAL HIGH (ref 17–63)
AST: 45 U/L — ABNORMAL HIGH (ref 15–41)
Albumin: 3.7 g/dL (ref 3.5–5.0)
Anion gap: 7 (ref 5–15)
BUN: 20 mg/dL (ref 6–20)
CALCIUM: 8.7 mg/dL — AB (ref 8.9–10.3)
CO2: 27 mmol/L (ref 22–32)
Chloride: 102 mmol/L (ref 101–111)
Creatinine, Ser: 0.81 mg/dL (ref 0.61–1.24)
GFR calc non Af Amer: >60 mL/min (ref >60)
Glucose, Bld: 189 mg/dL — ABNORMAL HIGH (ref 65–99)
POTASSIUM: 4.8 mmol/L (ref 3.5–5.1)
SODIUM: 136 mmol/L (ref 135–145)
TOTAL PROTEIN: 6.9 g/dL (ref 6.5–8.1)
Total Bilirubin: 0.5 mg/dL (ref 0.3–1.2)

## 2017-10-21 LAB — BRAIN NATRIURETIC PEPTIDE: B Natriuretic Peptide: 122.5 pg/mL — ABNORMAL HIGH (ref 0.0–100.0)

## 2017-10-21 LAB — TROPONIN I: Troponin I: <0.03 ng/mL (ref <0.03)

## 2017-10-21 MED ORDER — ALBUTEROL (5 MG/ML) CONTINUOUS INHALATION SOLN
INHALATION_SOLUTION | RESPIRATORY_TRACT | Status: AC
  Administered 2017-10-21: 3 mg/h
  Filled 2017-10-21: qty 20

## 2017-10-21 MED ORDER — IPRATROPIUM-ALBUTEROL 0.5-2.5 (3) MG/3ML IN SOLN
3.0000 mL | Freq: Once | RESPIRATORY_TRACT | Status: AC
  Administered 2017-10-21: 3 mL via RESPIRATORY_TRACT
  Filled 2017-10-21: qty 3

## 2017-10-21 MED ORDER — METHYLPREDNISOLONE SODIUM SUCC 125 MG IJ SOLR
80.0000 mg | Freq: Once | INTRAMUSCULAR | Status: AC
  Administered 2017-10-21: 80 mg via INTRAVENOUS
  Filled 2017-10-21: qty 2

## 2017-10-21 MED ORDER — IOPAMIDOL (ISOVUE-370) INJECTION 76%
100.0000 mL | Freq: Once | INTRAVENOUS | Status: AC | PRN
  Administered 2017-10-21: 100 mL via INTRAVENOUS

## 2017-10-21 MED ORDER — IPRATROPIUM BROMIDE 0.02 % IN SOLN
RESPIRATORY_TRACT | Status: AC
  Administered 2017-10-21: 0.5 mg
  Filled 2017-10-21: qty 2.5

## 2017-10-21 MED ORDER — ALBUTEROL SULFATE (2.5 MG/3ML) 0.083% IN NEBU: 5.0000 mg | INHALATION_SOLUTION | Freq: Once | RESPIRATORY_TRACT | Status: DC

## 2017-10-21 NOTE — Progress Notes (Signed)
62 yo male with OSA, hypertension, hyperlipidemia,  c/o dyspnea, po 90-92% on RA,  Wbc 12.2, Hgb 11.9 Plt 314  Bun 20, Creatinine 0.81 Ast 45, Alt 83.  BNP 122  CTA IMPRESSION: 1. Negative for acute pulmonary embolus. 2. 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 Recommend annual imaging followup by CTA or MRA. This recommendation follows 2010 ACCF/AHA/AATS/ACR/ASA/SCA/SCAI/SIR/STS/SVM Guidelines for the Diagnosis and Management of Patients with Thoracic Aortic Disease. Circulation. 2010; 121: E332-R518 3. Patchy bilateral hazy parenchymal attenuation in the lungs which may be secondary to diffuse atelectasis, small airways disease, or less likely edema 4. No CT evidence for acute intra-abdominal or pelvic abnormality.    Requested addition of influenza  Panel, viral resp panel   ? Reactive airway disease per ED.   ED doesn't think patient safe to go home.  Pt will be admitted for wheezing and dyspnea.

## 2017-10-21 NOTE — ED Provider Notes (Signed)
[facility]Y DEPARTMENT Provider Note   CSN: [id number] Arrival date & time: 10/21/17  1334     History   Chief Complaint Chief Complaint  Patient presents with  . Shortness of Breath    HPI Jeremy [name] is a 62 y.o. male with history of hypertension, hyperlipidemia, obesity who presents with a one-week history of productive cough and shortness of breath.  His shortness of breath is worse on exertion.  He saw his doctor 1 week ago as well as yesterday and has completed a course of doxycycline and is currently taking Tessalon and prednisone taper.  He had a chest x-ray which showed probable acute bronchitis.  He also reports he had an episode of chest pain while he was on the treadmill at the gym for about 7 minutes.  It resolved on its own.  Patient reports he has had chest pain/pressure on exertion before and sees cardiology.  He has had a stress test and reportedly everything short of a cardiac catheterization.  They have not found anything.  He denies any new leg pain or swelling.  Reports bilateral rib pain since onset of symptoms, but does report falling pretty hard on his right side 2 weeks ago.  It is worse with cough and deep breathing.  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  HPI  Past Medical History:  Diagnosis Date  . Anemia 11/16/2014  . Bladder disease    "an unusual bladder disease; don't know what it's called" (06/30/2015)  . Bulging lumbar disc   . GERD (gastroesophageal reflux disease)   . History of hiatal hernia   . Hyperglycemia 11/16/2014  . Hyperlipemia   . Hypertension   . Joint pain, knee   . Migraine    06/30/2015 "maybe 2-3 times/year; not as bad as I used to have them"  . OSA (obstructive sleep apnea)    "dr took me off the mask; told me to lose weight" (06/30/2015)    Patient Active Problem List   Diagnosis Date Noted  . Dyspnea 10/21/2017  . Hyperlipidemia   . Chronic diastolic heart  failure ([place])   . Atypical chest pain 06/30/2015  . OSA (obstructive sleep apnea) 02/10/2015  . GERD (gastroesophageal reflux disease) 02/10/2015  . Anxiety and depression 02/10/2015  . Anemia 11/16/2014  . Preventative health care 11/16/2014  . Hyperglycemia 11/16/2014  . HTN (hypertension) 10/10/2014  . Allergic rhinitis 10/10/2014    Past Surgical History:  Procedure Laterality Date  . ESOPHAGOGASTRODUODENOSCOPY    . ESOPHAGOGASTRODUODENOSCOPY (EGD) WITH ESOPHAGEAL DILATION  ~ 2014  . KNEE ARTHROSCOPY Right 1995  . NASAL SEPTUM SURGERY  ~ 1972  . TOTAL KNEE ARTHROPLASTY  10/11/2011   Procedure: TOTAL KNEE ARTHROPLASTY;  Surgeon: Rudean Haskell, MD;  Location: [facility];  Service: Orthopedics;  Laterality: Right;  . 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 Authorizing Provider  albuterol (PROVENTIL HFA;VENTOLIN HFA) 108 (90 Base) MCG/ACT inhaler Inhale 2 puffs into the lungs every 6 (six) hours as needed for wheezing or shortness of breath. 10/12/17   Saguier, Percell Miller, PA-C  albuterol (PROVENTIL) (2.5 MG/3ML) 0.083% nebulizer solution Take 3 mLs (2.5 mg total) by nebulization every 6 (six) hours as needed for wheezing or shortness of breath. 10/19/17   [name], NP  ALPRAZolam Duanne Moron) 0.5 MG  tablet Take 1 tablet (0.5 mg total) at bedtime as needed by mouth for anxiety. 07/01/17   [name], NP  amLODipine-benazepril (LOTREL) 10-40 MG capsule Take 1 capsule by mouth daily. 10/12/17   Saguier, Percell Miller, PA-C  b complex vitamins tablet Take 1 tablet by mouth daily.    [provider]  benzonatate (TESSALON) 100 MG capsule Take 1 capsule (100 mg total) by mouth 3 (three) times daily as needed for cough. 10/12/17   Saguier, Percell Miller, PA-C  doxycycline (VIBRA-TABS) 100 MG tablet Take 1 tablet (100 mg total) by mouth 2 (two) times daily.  Can give caps or generic 10/12/17   Saguier, Percell Miller, PA-C  ferrous sulfate 325 (65 FE) MG tablet Take 1 tablet (325 mg total) by mouth 2 (two) times daily with a meal. 12/01/16   [name], NP  fexofenadine (ALLEGRA) 180 MG tablet Take 180 mg by mouth daily.    [provider]  hydrochlorothiazide (HYDRODIURIL) 25 MG tablet Take 1 tablet (25 mg total) daily by mouth. 07/01/17   [name], NP  metoprolol succinate (TOPROL XL) 100 MG 24 hr tablet Take 1 tablet (100 mg total) by mouth daily. Take with or immediately following a meal. 07/18/17   [name], NP  Multiple Vitamin (MULTIVITAMIN WITH MINERALS) TABS tablet Take 1 tablet by mouth daily.    [provider]  omeprazole (PRILOSEC) 40 MG capsule Take 1 capsule (40 mg total) by mouth daily. 03/31/17   [name], NP  potassium chloride (K-DUR,KLOR-CON) 10 MEQ tablet TAKE 2 TABLETS DAILY 08/08/17   [name], NP  predniSONE (DELTASONE) 10 MG tablet 4 tabs by mouth once daily for 2 days, then 3 tabs daily x 2 days, then 2 tabs daily x 2 days, then 1 tab daily x 2 days 10/19/17   [name], NP  sildenafil (VIAGRA) 50 MG tablet Take 1 tablet (50 mg total) by mouth daily as needed for erectile dysfunction. 10/12/17   Saguier, Percell Miller, PA-C    Family History Family History  Problem Relation Age of Onset  . Stroke Mother 46       Died of CVA  . Hypertension Mother   . Anesthesia problems Neg Hx   . Hypotension Neg Hx   . Malignant hyperthermia Neg Hx   . Pseudochol deficiency Neg Hx     Social History Social History   Tobacco Use  . Smoking status: Former Smoker    Types: Cigarettes  . Smokeless tobacco: Never Used  . Tobacco comment: "stopped smoking in ~ 1990"  Substance Use Topics  . Alcohol use: No    Alcohol/week: 0.0 oz  . Drug use: No     Allergies   Patient has no known allergies.   Review of Systems Review of Systems  Constitutional: Negative for  chills and fever.  HENT: Negative for facial swelling and sore throat.   Respiratory: Positive for cough and shortness of breath.   Cardiovascular: Positive for chest pain (2 days ago).  Gastrointestinal: Negative for abdominal pain, nausea and vomiting.  Genitourinary: Negative for dysuria.  Musculoskeletal: Negative for back pain.  Skin: Negative for rash and wound.  Neurological: Negative for headaches.  Psychiatric/Behavioral: The patient is not nervous/anxious.      Physical Exam Updated Vital Signs BP 113/71   Pulse 65   Temp 97.7 F (36.5 C) (Oral)   Resp (!) 22   SpO2 94%   Physical Exam  Constitutional: He appears well-developed and well-nourished. No distress.  HENT:  Head: Normocephalic and atraumatic.  Mouth/Throat: Oropharynx is clear and moist. No oropharyngeal exudate.  Eyes: Conjunctivae are normal. Pupils are equal, round, and reactive to light. Right eye exhibits no discharge. Left eye exhibits no discharge. No scleral icterus.  Neck: Normal range of motion. Neck supple. No thyromegaly present.  Cardiovascular: Normal rate, regular rhythm, normal heart sounds and intact distal pulses. Exam reveals no gallop and no friction rub.  No murmur heard. Pulmonary/Chest: Effort normal. No stridor. No respiratory distress. He has decreased breath sounds. He has wheezes (expiratory bilaterally). He has no rales. He exhibits tenderness.    Abdominal: Soft. Bowel sounds are normal. He exhibits no distension. There is no tenderness. There is no rebound and no guarding.  Musculoskeletal: He exhibits no edema.  Lymphadenopathy:    He has no cervical adenopathy.  Neurological: He is alert. Coordination normal.  Skin: Skin is warm and dry. No rash noted. He is not diaphoretic. No pallor.  Psychiatric: He has a normal mood and affect.  Nursing note and vitals reviewed.    ED Treatments / Results  Labs (all labs ordered are listed, but only abnormal results are  displayed) Labs Reviewed  CBC WITH DIFFERENTIAL/PLATELET - Abnormal; Notable for the following components:      Result Value   WBC 12.2 (*)    RBC 3.87 (*)    Hemoglobin 11.9 (*)    HCT 36.8 (*)    Neutro Abs 10.2 (*)    All other components within normal limits  COMPREHENSIVE METABOLIC PANEL - Abnormal; Notable for the following components:   Glucose, Bld 189 (*)    Calcium 8.7 (*)    AST 45 (*)    ALT 83 (*)    All other components within normal limits  BRAIN NATRIURETIC PEPTIDE - Abnormal; Notable for the following components:   B Natriuretic Peptide 122.5 (*)    All other components within normal limits  RESPIRATORY PANEL BY PCR  TROPONIN I  INFLUENZA PANEL BY PCR (TYPE A & B)    EKG  EKG Interpretation  Date/Time:  Friday October 21 2017 13:51:51 EST Ventricular Rate:  70 PR Interval:    QRS Duration: 91 QT Interval:  380 QTC Calculation: 410 R Axis:   54 Text Interpretation:  Sinus rhythm Confirmed by Quintella Reichert [phone]) on 10/21/2017 3:25:43 PM       Radiology Dg Chest 2 View  Result Date: 10/21/2017 CLINICAL DATA:  Shortness of breath EXAM: CHEST  2 VIEW COMPARISON:  10/12/2017, 10/05/2015 FINDINGS: There is bilateral mild interstitial prominence. There is no focal parenchymal opacity. There is no pleural effusion or pneumothorax. The heart and mediastinal contours are unremarkable. The osseous structures are unremarkable. IMPRESSION: Bilateral mild interstitial prominence which may reflect interstitial edema versus interstitial infection. Electronically Signed   By: Kathreen Devoid   On: 10/21/2017 15:09   Ct Angio Chest Pe W And/or Wo Contrast  Result Date: 10/21/2017 CLINICAL DATA:  Lower abdominal pain with urinary burning, short of breath 2-3 weeks EXAM: CT ANGIOGRAPHY CHEST CT ABDOMEN AND PELVIS WITH CONTRAST TECHNIQUE: Multidetector CT imaging of the chest was performed using the standard protocol during bolus administration of intravenous contrast. Multiplanar  CT image reconstructions and MIPs were obtained to evaluate the vascular anatomy. Multidetector CT imaging of the abdomen and pelvis was performed using the standard protocol during bolus administration of intravenous contrast. CONTRAST:  18mL ISOVUE-370 IOPAMIDOL (ISOVUE-370) INJECTION 76% COMPARISON:  Chest x-ray 10/21/2017, report 07/27/2013 FINDINGS: CTA CHEST FINDINGS Cardiovascular:  Satisfactory opacification of the pulmonary arteries to the segmental level. No evidence of pulmonary embolism. Mild aneurysmal dilatation of the ascending aorta, measuring up to 4.1 cm. Common origin of the right brachiocephalic and left common carotid artery. Mild aortic atherosclerosis. Heart size within normal limits. No pericardial effusion. Mediastinum/Nodes: No enlarged mediastinal, hilar, or axillary lymph nodes. Thyroid gland, trachea, and esophagus demonstrate no significant findings. Lungs/Pleura: Bilateral mosaic attenuation. No pleural effusion or pneumothorax. Musculoskeletal: No chest wall abnormality. No acute or significant osseous findings. Review of the MIP images confirms the above findings. CT ABDOMEN and PELVIS FINDINGS Hepatobiliary: Subcentimeter hypodensities in the liver too small to further characterize. No calcified gallstones or biliary dilatation Pancreas: Unremarkable. No pancreatic ductal dilatation or surrounding inflammatory changes. Spleen: Normal in size without focal abnormality. Adrenals/Urinary Tract: Adrenal glands are within normal limits. No hydronephrosis. Nonspecific perinephric fat stranding. The bladder is within limits Stomach/Bowel: Stomach is within normal limits. Appendix appears normal. No evidence of bowel wall thickening, distention, or inflammatory changes. Vascular/Lymphatic: Mild aortic atherosclerosis. No aneurysmal dilatation. No significantly enlarged lymph nodes Reproductive: Prostate is unremarkable. Other: Negative for free air or free fluid. Skin thickening and mild  edema within the anterior subcutaneous fat. Small fat containing periumbilical hernia Musculoskeletal: Degenerative changes. No acute or suspicious lesion. Review of the MIP images confirms the above findings. IMPRESSION: 1. Negative for acute pulmonary embolus. 2. 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 Recommend annual imaging followup by CTA or MRA. This recommendation follows 2010 ACCF/AHA/AATS/ACR/ASA/SCA/SCAI/SIR/STS/SVM Guidelines for the Diagnosis and Management of Patients with Thoracic Aortic Disease. Circulation. 2010; 121: X381-W299 3. Patchy bilateral hazy parenchymal attenuation in the lungs which may be secondary to diffuse atelectasis, small airways disease, or less likely edema 4. No CT evidence for acute intra-abdominal or pelvic abnormality. Electronically Signed   By: Donavan Foil M.D.   On: 10/21/2017 17:36   Ct Abdomen Pelvis W Contrast  Result Date: 10/21/2017 CLINICAL DATA:  Lower abdominal pain with urinary burning, short of breath 2-3 weeks EXAM: CT ANGIOGRAPHY CHEST CT ABDOMEN AND PELVIS WITH CONTRAST TECHNIQUE: Multidetector CT imaging of the chest was performed using the standard protocol during bolus administration of intravenous contrast. Multiplanar CT image reconstructions and MIPs were obtained to evaluate the vascular anatomy. Multidetector CT imaging of the abdomen and pelvis was performed using the standard protocol during bolus administration of intravenous contrast. CONTRAST:  110mL ISOVUE-370 IOPAMIDOL (ISOVUE-370) INJECTION 76% COMPARISON:  Chest x-ray 10/21/2017, report 07/27/2013 FINDINGS: CTA CHEST FINDINGS Cardiovascular: Satisfactory opacification of the pulmonary arteries to the segmental level. No evidence of pulmonary embolism. Mild aneurysmal dilatation of the ascending aorta, measuring up to 4.1 cm. Common origin of the right brachiocephalic and left common carotid artery. Mild aortic atherosclerosis. Heart size within normal  limits. No pericardial effusion. Mediastinum/Nodes: No enlarged mediastinal, hilar, or axillary lymph nodes. Thyroid gland, trachea, and esophagus demonstrate no significant findings. Lungs/Pleura: Bilateral mosaic attenuation. No pleural effusion or pneumothorax. Musculoskeletal: No chest wall abnormality. No acute or significant osseous findings. Review of the MIP images confirms the above findings. CT ABDOMEN and PELVIS FINDINGS Hepatobiliary: Subcentimeter hypodensities in the liver too small to further characterize. No calcified gallstones or biliary dilatation Pancreas: Unremarkable. No pancreatic ductal dilatation or surrounding inflammatory changes. Spleen: Normal in size without focal abnormality. Adrenals/Urinary Tract: Adrenal glands are within normal limits. No hydronephrosis. Nonspecific perinephric fat stranding. The bladder is within limits Stomach/Bowel: Stomach is within normal limits. Appendix appears normal. No evidence of  bowel wall thickening, distention, or inflammatory changes. Vascular/Lymphatic: Mild aortic atherosclerosis. No aneurysmal dilatation. No significantly enlarged lymph nodes Reproductive: Prostate is unremarkable. Other: Negative for free air or free fluid. Skin thickening and mild edema within the anterior subcutaneous fat. Small fat containing periumbilical hernia Musculoskeletal: Degenerative changes. No acute or suspicious lesion. Review of the MIP images confirms the above findings. IMPRESSION: 1. Negative for acute pulmonary embolus. 2. 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 Recommend annual imaging followup by CTA or MRA. This recommendation follows 2010 ACCF/AHA/AATS/ACR/ASA/SCA/SCAI/SIR/STS/SVM Guidelines for the Diagnosis and Management of Patients with Thoracic Aortic Disease. Circulation. 2010; 121: Q759-F638 3. Patchy bilateral hazy parenchymal attenuation in the lungs which may be secondary to diffuse atelectasis, small airways  disease, or less likely edema 4. No CT evidence for acute intra-abdominal or pelvic abnormality. Electronically Signed   By: Donavan Foil M.D.   On: 10/21/2017 17:36    Procedures Procedures (including critical care time)  Medications Ordered in ED Medications  albuterol (PROVENTIL) (2.5 MG/3ML) 0.083% nebulizer solution 5 mg (5 mg Nebulization Not Given 10/21/17 1350)  ipratropium (ATROVENT) 0.02 % nebulizer solution (0.5 mg  Given 10/21/17 1349)  albuterol (PROVENTIL, VENTOLIN) (5 MG/ML) 0.5% continuous inhalation solution (3 mg/hr  Given 10/21/17 1349)  ipratropium-albuterol (DUONEB) 0.5-2.5 (3) MG/3ML nebulizer solution 3 mL (3 mLs Nebulization Given 10/21/17 1758)  iopamidol (ISOVUE-370) 76 % injection 100 mL (100 mLs Intravenous Contrast Given 10/21/17 1656)  methylPREDNISolone sodium succinate (SOLU-MEDROL) 125 mg/2 mL injection 80 mg (80 mg Intravenous Given 10/21/17 1945)     Initial Impression / Assessment and Plan / ED Course  I have reviewed the triage vital signs and the nursing notes.  Pertinent labs & imaging results that were available during my care of the patient were reviewed by me and considered in my medical decision making (see chart for details).     Patient with a one-week history of worsening cough and shortness of breath.  Patient wheezing on arrival.  Patient requiring oxygen, saturating at 90-92% on room air. Antibiotics not initiated considering no defined pneumonia.  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 Chest x-ray shows bilateral mild interstitial prominence which may reflect interstitial edema versus interstitial infection.  CT angio chest shows no PE, but patchy bilateral hazy parenchymal attenuation in the lungs which may be secondary to diffuse atelectasis, small airways disease, or less likely edema; mild aneurysmal dilatation of the ascending aorta up to 4.1 cm seen, recommend annual imaging follow-up  (patient made aware of this).  CT abdomen pelvis is negative.  No liver laceration from fall that patient described.  WBC 12.2, hemoglobin 11.9.  CMP shows AST 45, ALT 83, glucose 189.  Troponin negative.  BNP 122.5.  Influenza test pending.  Patient improved with DuoNeb's in the ED, however falling back to increased shortness of breath in between.  Considering patient's dyspnea, I consulted [facility] hospitalist at [facility] and spoke with Dr. Maudie Mercury, who agrees to admission.  Patient also evaluated by Dr. Ralene Bathe who guided the patient's management and agrees with plan.  Final Clinical Impressions(s) / ED Diagnoses   Final diagnoses:  Shortness of breath    ED Discharge Orders    None       Frederica Kuster, PA-C 10/21/17 2354    Quintella Reichert, MD 10/25/17 [phone]

## 2017-10-21 NOTE — ED Triage Notes (Signed)
Patient reports shortness of breath x 1 week.  Patient noted to be labored while speaking.  Reports intermittent chest pain-none at present.  States last event was yesterday.

## 2017-10-21 NOTE — ED Notes (Signed)
ED Provider at bedside. 

## 2017-10-21 NOTE — ED Notes (Signed)
Delay in xray, Pt has 10 more minutes of breathing tx

## 2017-10-22 ENCOUNTER — Encounter (HOSPITAL_COMMUNITY): Payer: Self-pay | Admitting: Family Medicine

## 2017-10-22 DIAGNOSIS — R0609 Other forms of dyspnea: Secondary | ICD-10-CM | POA: Diagnosis present

## 2017-10-22 DIAGNOSIS — R0600 Dyspnea, unspecified: Secondary | ICD-10-CM | POA: Diagnosis present

## 2017-10-22 DIAGNOSIS — J45909 Unspecified asthma, uncomplicated: Secondary | ICD-10-CM | POA: Diagnosis not present

## 2017-10-22 DIAGNOSIS — E785 Hyperlipidemia, unspecified: Secondary | ICD-10-CM | POA: Diagnosis not present

## 2017-10-22 DIAGNOSIS — I5032 Chronic diastolic (congestive) heart failure: Secondary | ICD-10-CM | POA: Diagnosis not present

## 2017-10-22 DIAGNOSIS — J9801 Acute bronchospasm: Secondary | ICD-10-CM

## 2017-10-22 DIAGNOSIS — I5033 Acute on chronic diastolic (congestive) heart failure: Secondary | ICD-10-CM

## 2017-10-22 DIAGNOSIS — I110 Hypertensive heart disease with heart failure: Secondary | ICD-10-CM | POA: Diagnosis not present

## 2017-10-22 LAB — GLUCOSE, CAPILLARY
GLUCOSE-CAPILLARY: 153 mg/dL — AB (ref 65–99)
GLUCOSE-CAPILLARY: 122 mg/dL — AB (ref 65–99)
Glucose-Capillary: 119 mg/dL — ABNORMAL HIGH (ref 65–99)
Glucose-Capillary: 125 mg/dL — ABNORMAL HIGH (ref 65–99)
Glucose-Capillary: 156 mg/dL — ABNORMAL HIGH (ref 65–99)

## 2017-10-22 LAB — COMPREHENSIVE METABOLIC PANEL
ALBUMIN: 3.4 g/dL — AB (ref 3.5–5.0)
ALT: 76 U/L — AB (ref 17–63)
ANION GAP: 11 (ref 5–15)
AST: 36 U/L (ref 15–41)
Alkaline Phosphatase: 73 U/L (ref 38–126)
BILIRUBIN TOTAL: 0.8 mg/dL (ref 0.3–1.2)
BUN: 16 mg/dL (ref 6–20)
CALCIUM: 8.9 mg/dL (ref 8.9–10.3)
CO2: 27 mmol/L (ref 22–32)
CREATININE: 0.99 mg/dL (ref 0.61–1.24)
Chloride: 99 mmol/L — ABNORMAL LOW (ref 101–111)
GFR calc Af Amer: >60 mL/min (ref >60)
GFR calc non Af Amer: >60 mL/min (ref >60)
GLUCOSE: 246 mg/dL — AB (ref 65–99)
Potassium: 4.8 mmol/L (ref 3.5–5.1)
SODIUM: 137 mmol/L (ref 135–145)
TOTAL PROTEIN: 6.2 g/dL — AB (ref 6.5–8.1)

## 2017-10-22 LAB — RESPIRATORY PANEL BY PCR
Adenovirus: NOT DETECTED
Bordetella pertussis: NOT DETECTED
CORONAVIRUS NL63-RVPPCR: NOT DETECTED
CORONAVIRUS OC43-RVPPCR: NOT DETECTED
Chlamydophila pneumoniae: NOT DETECTED
Coronavirus 229E: NOT DETECTED
Coronavirus HKU1: NOT DETECTED
INFLUENZA A-RVPPCR: NOT DETECTED
INFLUENZA B-RVPPCR: NOT DETECTED
METAPNEUMOVIRUS-RVPPCR: NOT DETECTED
Mycoplasma pneumoniae: NOT DETECTED
PARAINFLUENZA VIRUS 1-RVPPCR: NOT DETECTED
PARAINFLUENZA VIRUS 2-RVPPCR: NOT DETECTED
PARAINFLUENZA VIRUS 3-RVPPCR: NOT DETECTED
PARAINFLUENZA VIRUS 4-RVPPCR: NOT DETECTED
RESPIRATORY SYNCYTIAL VIRUS-RVPPCR: NOT DETECTED
Rhinovirus / Enterovirus: NOT DETECTED

## 2017-10-22 LAB — CBC WITH DIFFERENTIAL/PLATELET
BASOS PCT: 0 %
Basophils Absolute: 0 10*3/uL (ref 0.0–0.1)
EOS PCT: 0 %
Eosinophils Absolute: 0 10*3/uL (ref 0.0–0.7)
HEMATOCRIT: 37.7 % — AB (ref 39.0–52.0)
Hemoglobin: 11.9 g/dL — ABNORMAL LOW (ref 13.0–17.0)
Lymphocytes Relative: 8 %
Lymphs Abs: 0.9 10*3/uL (ref 0.7–4.0)
MCH: 30.1 pg (ref 26.0–34.0)
MCHC: 31.6 g/dL (ref 30.0–36.0)
MCV: 95.4 fL (ref 78.0–100.0)
MONO ABS: 0.1 10*3/uL (ref 0.1–1.0)
MONOS PCT: 1 %
NEUTROS ABS: 11 10*3/uL — AB (ref 1.7–7.7)
Neutrophils Relative %: 91 %
PLATELETS: 302 10*3/uL (ref 150–400)
RBC: 3.95 MIL/uL — ABNORMAL LOW (ref 4.22–5.81)
RDW: 14.4 % (ref 11.5–15.5)
WBC: 12 10*3/uL — ABNORMAL HIGH (ref 4.0–10.5)

## 2017-10-22 LAB — INFLUENZA PANEL BY PCR (TYPE A & B)
Influenza A By PCR: NEGATIVE
Influenza B By PCR: NEGATIVE

## 2017-10-22 LAB — HIV ANTIBODY (ROUTINE TESTING W REFLEX): HIV Screen 4th Generation wRfx: NONREACTIVE

## 2017-10-22 MED ORDER — HYDROCODONE-ACETAMINOPHEN 5-325 MG PO TABS: 1.0000 | ORAL_TABLET | ORAL | Status: DC | PRN

## 2017-10-22 MED ORDER — SODIUM CHLORIDE 0.9% FLUSH
3.0000 mL | Freq: Two times a day (BID) | INTRAVENOUS | Status: DC
  Administered 2017-10-25: 3 mL via INTRAVENOUS

## 2017-10-22 MED ORDER — LORATADINE 10 MG PO TABS
10.0000 mg | ORAL_TABLET | Freq: Every day | ORAL | Status: DC
  Administered 2017-10-22 – 2017-10-25 (×4): 10 mg via ORAL
  Filled 2017-10-22 (×4): qty 1

## 2017-10-22 MED ORDER — HYDROCHLOROTHIAZIDE 25 MG PO TABS
25.0000 mg | ORAL_TABLET | Freq: Every day | ORAL | Status: DC
  Administered 2017-10-22: 25 mg via ORAL
  Filled 2017-10-22: qty 1

## 2017-10-22 MED ORDER — PANTOPRAZOLE SODIUM 40 MG PO TBEC
40.0000 mg | DELAYED_RELEASE_TABLET | Freq: Every day | ORAL | Status: DC
  Administered 2017-10-22 – 2017-10-25 (×4): 40 mg via ORAL
  Filled 2017-10-22 (×4): qty 1

## 2017-10-22 MED ORDER — ADULT MULTIVITAMIN W/MINERALS CH
1.0000 | ORAL_TABLET | Freq: Every day | ORAL | Status: DC
  Administered 2017-10-22 – 2017-10-25 (×4): 1 via ORAL
  Filled 2017-10-22 (×4): qty 1

## 2017-10-22 MED ORDER — ONDANSETRON HCL 4 MG PO TABS: 4.0000 mg | ORAL_TABLET | Freq: Four times a day (QID) | ORAL | Status: DC | PRN

## 2017-10-22 MED ORDER — ALPRAZOLAM 0.5 MG PO TABS
0.5000 mg | ORAL_TABLET | Freq: Every evening | ORAL | Status: DC | PRN
  Administered 2017-10-23 – 2017-10-24 (×3): 0.5 mg via ORAL
  Filled 2017-10-22 (×4): qty 1

## 2017-10-22 MED ORDER — SODIUM CHLORIDE 0.9% FLUSH
3.0000 mL | Freq: Two times a day (BID) | INTRAVENOUS | Status: DC
  Administered 2017-10-22 – 2017-10-24 (×6): 3 mL via INTRAVENOUS

## 2017-10-22 MED ORDER — ENOXAPARIN SODIUM 40 MG/0.4ML ~~LOC~~ SOLN
40.0000 mg | SUBCUTANEOUS | Status: DC
  Administered 2017-10-22 – 2017-10-25 (×4): 40 mg via SUBCUTANEOUS
  Filled 2017-10-22 (×4): qty 0.4

## 2017-10-22 MED ORDER — SODIUM CHLORIDE 0.9 % IV SOLN: 250.0000 mL | INTRAVENOUS | Status: DC | PRN

## 2017-10-22 MED ORDER — SENNOSIDES-DOCUSATE SODIUM 8.6-50 MG PO TABS: 1.0000 | ORAL_TABLET | Freq: Every evening | ORAL | Status: DC | PRN

## 2017-10-22 MED ORDER — ACETAMINOPHEN 325 MG PO TABS: 650.0000 mg | ORAL_TABLET | Freq: Four times a day (QID) | ORAL | Status: DC | PRN

## 2017-10-22 MED ORDER — B COMPLEX-C PO TABS
1.0000 | ORAL_TABLET | Freq: Every day | ORAL | Status: DC
  Administered 2017-10-22 – 2017-10-25 (×4): 1 via ORAL
  Filled 2017-10-22 (×4): qty 1

## 2017-10-22 MED ORDER — SODIUM CHLORIDE 0.9% FLUSH: 3.0000 mL | INTRAVENOUS | Status: DC | PRN

## 2017-10-22 MED ORDER — FUROSEMIDE 10 MG/ML IJ SOLN
40.0000 mg | Freq: Once | INTRAMUSCULAR | Status: AC
  Administered 2017-10-22: 40 mg via INTRAVENOUS
  Filled 2017-10-22: qty 4

## 2017-10-22 MED ORDER — AMLODIPINE BESYLATE 10 MG PO TABS
10.0000 mg | ORAL_TABLET | Freq: Every day | ORAL | Status: DC
  Administered 2017-10-22 – 2017-10-25 (×4): 10 mg via ORAL
  Filled 2017-10-22 (×4): qty 1

## 2017-10-22 MED ORDER — INSULIN ASPART 100 UNIT/ML ~~LOC~~ SOLN: 0.0000 [IU] | Freq: Every day | SUBCUTANEOUS | Status: DC

## 2017-10-22 MED ORDER — ALBUTEROL SULFATE (2.5 MG/3ML) 0.083% IN NEBU: 2.5000 mg | INHALATION_SOLUTION | RESPIRATORY_TRACT | Status: DC | PRN

## 2017-10-22 MED ORDER — BENAZEPRIL HCL 40 MG PO TABS
40.0000 mg | ORAL_TABLET | Freq: Every day | ORAL | Status: DC
  Administered 2017-10-22 – 2017-10-25 (×4): 40 mg via ORAL
  Filled 2017-10-22 (×4): qty 1

## 2017-10-22 MED ORDER — METOPROLOL SUCCINATE ER 100 MG PO TB24
100.0000 mg | ORAL_TABLET | Freq: Every day | ORAL | Status: DC
  Administered 2017-10-22 – 2017-10-25 (×4): 100 mg via ORAL
  Filled 2017-10-22 (×4): qty 1

## 2017-10-22 MED ORDER — INSULIN ASPART 100 UNIT/ML ~~LOC~~ SOLN: 0.0000 [IU] | Freq: Three times a day (TID) | SUBCUTANEOUS | Status: DC

## 2017-10-22 MED ORDER — ONDANSETRON HCL 4 MG/2ML IJ SOLN: 4.0000 mg | Freq: Four times a day (QID) | INTRAMUSCULAR | Status: DC | PRN

## 2017-10-22 MED ORDER — METHYLPREDNISOLONE SODIUM SUCC 40 MG IJ SOLR
40.0000 mg | Freq: Three times a day (TID) | INTRAMUSCULAR | Status: DC
  Administered 2017-10-22 – 2017-10-24 (×7): 40 mg via INTRAVENOUS
  Filled 2017-10-22 (×7): qty 1

## 2017-10-22 MED ORDER — ACETAMINOPHEN 650 MG RE SUPP: 650.0000 mg | Freq: Four times a day (QID) | RECTAL | Status: DC | PRN

## 2017-10-22 MED ORDER — BENZONATATE 100 MG PO CAPS: 100.0000 mg | ORAL_CAPSULE | Freq: Three times a day (TID) | ORAL | Status: DC | PRN

## 2017-10-22 MED ORDER — BISACODYL 5 MG PO TBEC: 5.0000 mg | DELAYED_RELEASE_TABLET | Freq: Every day | ORAL | Status: DC | PRN

## 2017-10-22 NOTE — Progress Notes (Signed)
Second page to MD Juliane Lack regarding pt chest pain  Awaiting call back

## 2017-10-22 NOTE — Progress Notes (Signed)
Patient arrived to [facility] via [facility]. Patient alert and oriented. [facility] admissions paged for orders. CCMD notified. No complaints at this time. Will continue to monitor.

## 2017-10-22 NOTE — Progress Notes (Signed)
MD Rodena Piety aware of CP, no new orders at this time

## 2017-10-22 NOTE — Progress Notes (Signed)
62 year old male admitted early this morning with shortness of breath and wheezing patient is a non-smoker.  She has a history of chronic diastolic CHF hypertension and anxiety.  He denied history of COPD or asthma.  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  He also had complaints of orthopnea for the past few weeks with some lower extremity edema.  This morning when he was straining to have a bowel movement he experienced an episode of atypical chest pain which he describes as he had been having this pain for 5 years and it goes away on its own.  CT of the chest showed patchy bilateral haziness suggestive of small airway disease less likely edema.  He is admitted with acute hypoxic respiratory failure secondary to reactive airway disease and mild diastolic CHF.  He received a dose of Lasix in the ER.  He is on hydrochlorothiazide 25 mg daily which is being continued an echocardiogram has been ordered which is pending at this time currently he is not on any antibiotics we will monitor him without antibiotics continue IV steroids.  Will consider cardiology consult if needed.

## 2017-10-22 NOTE — Progress Notes (Signed)
Pt calls RN regarding chest pain after straining for BM  Pt states it feels like a "twinge" and it is not new, has been going on for 5 years  Education provided Vital signs documented  EKG completed and placed in chart  Pt refusing SL nitro  Pt states chest pain is improving  Pt resting in bed  Paged MD to inform

## 2017-10-22 NOTE — H&P (Signed)
History and Physical    Jeremy [name] QPY:[id number] DOB: 08-13-1956 DOA: 10/21/2017  PCP: [name], NP   Patient coming from: Home, by way of [facility]  Chief Complaint: Cough, SOB, wheezing   HPI: Jeremy [name] is a 62 y.o. male with medical history significant for chronic diastolic CHF, hypertension, and anxiety, now presenting to the emergency department for evaluation of shortness of breath, cough, and wheezing.  Patient reports that he quit smoking roughly 40 years ago, denies history of COPD or asthma, but reports close to 1 month of dyspnea with wheezing and cough productive of thick white sputum.  He has completed a course of doxycycline prednisone taper reports that he improved some with each of these, but then re-worsens.  He also reports fleeting improvement after a neb treatment.  He describes orthopnea for the past couple of weeks and mild bilateral lower extremity edema.  Denies chest pain, palpitations, or fevers.  [facility] ED Course: Upon arrival to the ED, patient is found to be afebrile, saturating low 90s on room air, slightly tachypneic, and vitals otherwise stable.  EKG features a sinus rhythm bilateral mild interstitial prominence, possibly reflecting edema or infection.  Chemistry panel is notable for serum glucose of 189 and slight elevation BC features a mild anemia and leukocytosis to 12,200.  BNP is elevated to 123 and troponin is undetectable.  CTA chest is negative for acute PE, but notable for patchy bilateral haziness suggestive of small airways disease or less likely edema.  Respiratory viral panel remains pending patient was treated with continuous albuterol neb, DuoNeb, and 125 mg of IV Solu-Medrol.  He remains hemodynamically stable, is not in acute distress, and is observed on the telemetry unit for ongoing evaluation and management of dyspnea with productive cough, wheeze, and orthopnea.  Review of Systems:  All other systems reviewed  and apart from HPI, are negative.  Past Medical History:  Diagnosis Date  . Anemia 11/16/2014  . Bladder disease    "an unusual bladder disease; don't know what it's called" (06/30/2015)  . Bulging lumbar disc   . GERD (gastroesophageal reflux disease)   . History of hiatal hernia   . Hyperglycemia 11/16/2014  . Hyperlipemia   . Hypertension   . Joint pain, knee   . Migraine    06/30/2015 "maybe 2-3 times/year; not as bad as I used to have them"  . OSA (obstructive sleep apnea)    "dr took me off the mask; told me to lose weight" (06/30/2015)    Past Surgical History:  Procedure Laterality Date  . ESOPHAGOGASTRODUODENOSCOPY    . ESOPHAGOGASTRODUODENOSCOPY (EGD) WITH ESOPHAGEAL DILATION  ~ 2014  . KNEE ARTHROSCOPY Right 1995  . NASAL SEPTUM SURGERY  ~ 1972  . TOTAL KNEE ARTHROPLASTY  10/11/2011   Procedure: TOTAL KNEE ARTHROPLASTY;  Surgeon: Rudean Haskell, MD;  Location: [facility];  Service: Orthopedics;  Laterality: Right;  . 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 His smoking use included cigarettes. he has never used smokeless tobacco. He reports that he does not drink alcohol or use drugs.  No Known Allergies  Family History  Problem Relation Age of Onset  . Stroke Mother 63       Died of CVA  . Hypertension Mother   . Anesthesia problems Neg Hx   . Hypotension Neg Hx   . Malignant hyperthermia Neg Hx   . Pseudochol  deficiency Neg Hx      Prior to Admission medications   Medication Sig Start Date End Date Taking? Authorizing Provider  albuterol (PROVENTIL HFA;VENTOLIN HFA) 108 (90 Base) MCG/ACT inhaler Inhale 2 puffs into the lungs every 6 (six) hours as needed for wheezing or shortness of breath. 10/12/17   Saguier, Percell Miller, PA-C  albuterol (PROVENTIL) (2.5 MG/3ML) 0.083% nebulizer solution Take 3 mLs (2.5 mg total) by nebulization every 6 (six) hours as needed for wheezing or shortness of breath.  10/19/17   [name], NP  ALPRAZolam Duanne Moron) 0.5 MG tablet Take 1 tablet (0.5 mg total) at bedtime as needed by mouth for anxiety. 07/01/17   [name], NP  amLODipine-benazepril (LOTREL) 10-40 MG capsule Take 1 capsule by mouth daily. 10/12/17   Saguier, Percell Miller, PA-C  b complex vitamins tablet Take 1 tablet by mouth daily.    [provider]  benzonatate (TESSALON) 100 MG capsule Take 1 capsule (100 mg total) by mouth 3 (three) times daily as needed for cough. 10/12/17   Saguier, Percell Miller, PA-C  doxycycline (VIBRA-TABS) 100 MG tablet Take 1 tablet (100 mg total) by mouth 2 (two) times daily. Can give caps or generic 10/12/17   Saguier, Percell Miller, PA-C  ferrous sulfate 325 (65 FE) MG tablet Take 1 tablet (325 mg total) by mouth 2 (two) times daily with a meal. 12/01/16   [name], NP  fexofenadine (ALLEGRA) 180 MG tablet Take 180 mg by mouth daily.    [provider]  hydrochlorothiazide (HYDRODIURIL) 25 MG tablet Take 1 tablet (25 mg total) daily by mouth. 07/01/17   [name], NP  metoprolol succinate (TOPROL XL) 100 MG 24 hr tablet Take 1 tablet (100 mg total) by mouth daily. Take with or immediately following a meal. 07/18/17   [name], NP  Multiple Vitamin (MULTIVITAMIN WITH MINERALS) TABS tablet Take 1 tablet by mouth daily.    [provider]  omeprazole (PRILOSEC) 40 MG capsule Take 1 capsule (40 mg total) by mouth daily. 03/31/17   [name], NP  potassium chloride (K-DUR,KLOR-CON) 10 MEQ tablet TAKE 2 TABLETS DAILY 08/08/17   [name], NP  predniSONE (DELTASONE) 10 MG tablet 4 tabs by mouth once daily for 2 days, then 3 tabs daily x 2 days, then 2 tabs daily x 2 days, then 1 tab daily x 2 days 10/19/17   [name], NP  sildenafil (VIAGRA) 50 MG tablet Take 1 tablet (50 mg total) by mouth daily as needed for erectile dysfunction. 10/12/17   Mackie Pai, PA-C    Physical Exam: Vitals:    10/21/17 2200 10/21/17 2230 10/21/17 2300 10/22/17 0048  BP: 123/84 129/78 113/71 139/84  Pulse: 71 69 65 73  Resp: (!) 22 (!) 21 (!) 22 (!) 22  Temp:    97.9 F (36.6 C)  TempSrc:    Oral  SpO2: 95% 94% 94% 97%  Weight:    121.8 kg (268 lb 9.6 oz)  Height:    5\' 7"  (1.702 m)      Constitutional: NAD, calm  Eyes: PERTLA, lids and conjunctivae normal ENMT: Mucous membranes are moist. Posterior pharynx clear of any exudate or lesions.   Neck: normal, supple, no masses, no thyromegaly Respiratory: End-expiratory wheeze on right, fine rales bilaterally. No accessory muscle use.  Cardiovascular: S1 & S2 heard, regular rate and rhythm. 1+ pretibial edema to bilateral LE's.  Abdomen: No distension, no tenderness, no masses palpated. Bowel sounds normal.  Musculoskeletal: no clubbing / cyanosis. No  joint deformity upper and lower extremities.   Skin: no significant rashes, lesions, ulcers. Warm, dry, well-perfused. Neurologic: CN 2-12 grossly intact. Sensation intact. Strength 5/5 in all 4 limbs.  Psychiatric:  Alert and oriented x 3. Pleasant, cooperative.     Labs on Admission: I have personally reviewed following labs and imaging studies  CBC: Recent Labs  Lab 10/21/17 1426  WBC 12.2*  NEUTROABS 10.2*  HGB 11.9*  HCT 36.8*  MCV 95.1  PLT 539   Basic Metabolic Panel: Recent Labs  Lab 10/21/17 1426  NA 136  K 4.8  CL 102  CO2 27  GLUCOSE 189*  BUN 20  CREATININE 0.81  CALCIUM 8.7*   GFR: Estimated Creatinine Clearance: 118.2 mL/min (by C-G formula based on SCr of 0.81 mg/dL). Liver Function Tests: Recent Labs  Lab 10/21/17 1426  AST 45*  ALT 83*  ALKPHOS 74  BILITOT 0.5  PROT 6.9  ALBUMIN 3.7   No results for input(s): LIPASE, AMYLASE in the last 168 hours. No results for input(s): AMMONIA in the last 168 hours. Coagulation Profile: No results for input(s): INR, PROTIME in the last 168 hours. Cardiac Enzymes: Recent Labs  Lab 10/21/17 1426    TROPONINI <0.03   BNP (last 3 results) No results for input(s): PROBNP in the last 8760 hours. HbA1C: No results for input(s): HGBA1C in the last 72 hours. CBG: Recent Labs  Lab 10/22/17 0137  GLUCAP 153*   Lipid Profile: No results for input(s): CHOL, HDL, LDLCALC, TRIG, CHOLHDL, LDLDIRECT in the last 72 hours. Thyroid Function Tests: No results for input(s): TSH, T4TOTAL, FREET4, T3FREE, THYROIDAB in the last 72 hours. Anemia Panel: No results for input(s): VITAMINB12, FOLATE, FERRITIN, TIBC, IRON, RETICCTPCT in the last 72 hours. Urine analysis:    Component Value Date/Time   COLORURINE YELLOW 10/01/2011 [place] 10/01/2011 1223   LABSPEC 1.020 10/01/2011 1223   PHURINE 5.0 10/01/2011 1223   GLUCOSEU NEGATIVE 10/01/2011 1223   HGBUR NEGATIVE 10/01/2011 1223   BILIRUBINUR NEGATIVE 10/01/2011 1223   KETONESUR NEGATIVE 10/01/2011 1223   PROTEINUR NEGATIVE 10/01/2011 1223   UROBILINOGEN 0.2 10/01/2011 1223   NITRITE NEGATIVE 10/01/2011 1223   LEUKOCYTESUR NEGATIVE 10/01/2011 1223   Sepsis Labs: @LABRCNTIP (procalcitonin:4,lacticidven:4) )No results found for this or any previous visit (from the past 240 hour(s)).   Radiological Exams on Admission: Dg Chest 2 View  Result Date: 10/21/2017 CLINICAL DATA:  Shortness of breath EXAM: CHEST  2 VIEW COMPARISON:  10/12/2017, 10/05/2015 FINDINGS: There is bilateral mild interstitial prominence. There is no focal parenchymal opacity. There is no pleural effusion or pneumothorax. The heart and mediastinal contours are unremarkable. The osseous structures are unremarkable. IMPRESSION: Bilateral mild interstitial prominence which may reflect interstitial edema versus interstitial infection. Electronically Signed   By: Kathreen Devoid   On: 10/21/2017 15:09   Ct Angio Chest Pe W And/or Wo Contrast  Result Date: 10/21/2017 CLINICAL DATA:  Lower abdominal pain with urinary burning, short of breath 2-3 weeks EXAM: CT  ANGIOGRAPHY CHEST CT ABDOMEN AND PELVIS WITH CONTRAST TECHNIQUE: Multidetector CT imaging of the chest was performed using the standard protocol during bolus administration of intravenous contrast. Multiplanar CT image reconstructions and MIPs were obtained to evaluate the vascular anatomy. Multidetector CT imaging of the abdomen and pelvis was performed using the standard protocol during bolus administration of intravenous contrast. CONTRAST:  153mL ISOVUE-370 IOPAMIDOL (ISOVUE-370) INJECTION 76% COMPARISON:  Chest x-ray 10/21/2017, report 07/27/2013 FINDINGS: CTA CHEST FINDINGS Cardiovascular: Satisfactory opacification of  the pulmonary arteries to the segmental level. No evidence of pulmonary embolism. Mild aneurysmal dilatation of the ascending aorta, measuring up to 4.1 cm. Common origin of the right brachiocephalic and left common carotid artery. Mild aortic atherosclerosis. Heart size within normal limits. No pericardial effusion. Mediastinum/Nodes: No enlarged mediastinal, hilar, or axillary lymph nodes. Thyroid gland, trachea, and esophagus demonstrate no significant findings. Lungs/Pleura: Bilateral mosaic attenuation. No pleural effusion or pneumothorax. Musculoskeletal: No chest wall abnormality. No acute or significant osseous findings. Review of the MIP images confirms the above findings. CT ABDOMEN and PELVIS FINDINGS Hepatobiliary: Subcentimeter hypodensities in the liver too small to further characterize. No calcified gallstones or biliary dilatation Pancreas: Unremarkable. No pancreatic ductal dilatation or surrounding inflammatory changes. Spleen: Normal in size without focal abnormality. Adrenals/Urinary Tract: Adrenal glands are within normal limits. No hydronephrosis. Nonspecific perinephric fat stranding. The bladder is within limits Stomach/Bowel: Stomach is within normal limits. Appendix appears normal. No evidence of bowel wall thickening, distention, or inflammatory changes.  Vascular/Lymphatic: Mild aortic atherosclerosis. No aneurysmal dilatation. No significantly enlarged lymph nodes Reproductive: Prostate is unremarkable. Other: Negative for free air or free fluid. Skin thickening and mild edema within the anterior subcutaneous fat. Small fat containing periumbilical hernia Musculoskeletal: Degenerative changes. No acute or suspicious lesion. Review of the MIP images confirms the above findings. IMPRESSION: 1. Negative for acute pulmonary embolus. 2. 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 Recommend annual imaging followup by CTA or MRA. This recommendation follows 2010 ACCF/AHA/AATS/ACR/ASA/SCA/SCAI/SIR/STS/SVM Guidelines for the Diagnosis and Management of Patients with Thoracic Aortic Disease. Circulation. 2010; 121: G626-R485 3. Patchy bilateral hazy parenchymal attenuation in the lungs which may be secondary to diffuse atelectasis, small airways disease, or less likely edema 4. No CT evidence for acute intra-abdominal or pelvic abnormality. Electronically Signed   By: Donavan Foil M.D.   On: 10/21/2017 17:36   Ct Abdomen Pelvis W Contrast  Result Date: 10/21/2017 CLINICAL DATA:  Lower abdominal pain with urinary burning, short of breath 2-3 weeks EXAM: CT ANGIOGRAPHY CHEST CT ABDOMEN AND PELVIS WITH CONTRAST TECHNIQUE: Multidetector CT imaging of the chest was performed using the standard protocol during bolus administration of intravenous contrast. Multiplanar CT image reconstructions and MIPs were obtained to evaluate the vascular anatomy. Multidetector CT imaging of the abdomen and pelvis was performed using the standard protocol during bolus administration of intravenous contrast. CONTRAST:  136mL ISOVUE-370 IOPAMIDOL (ISOVUE-370) INJECTION 76% COMPARISON:  Chest x-ray 10/21/2017, report 07/27/2013 FINDINGS: CTA CHEST FINDINGS Cardiovascular: Satisfactory opacification of the pulmonary arteries to the segmental level. No evidence of  pulmonary embolism. Mild aneurysmal dilatation of the ascending aorta, measuring up to 4.1 cm. Common origin of the right brachiocephalic and left common carotid artery. Mild aortic atherosclerosis. Heart size within normal limits. No pericardial effusion. Mediastinum/Nodes: No enlarged mediastinal, hilar, or axillary lymph nodes. Thyroid gland, trachea, and esophagus demonstrate no significant findings. Lungs/Pleura: Bilateral mosaic attenuation. No pleural effusion or pneumothorax. Musculoskeletal: No chest wall abnormality. No acute or significant osseous findings. Review of the MIP images confirms the above findings. CT ABDOMEN and PELVIS FINDINGS Hepatobiliary: Subcentimeter hypodensities in the liver too small to further characterize. No calcified gallstones or biliary dilatation Pancreas: Unremarkable. No pancreatic ductal dilatation or surrounding inflammatory changes. Spleen: Normal in size without focal abnormality. Adrenals/Urinary Tract: Adrenal glands are within normal limits. No hydronephrosis. Nonspecific perinephric fat stranding. The bladder is within limits Stomach/Bowel: Stomach is within normal limits. Appendix appears normal. No evidence of bowel wall thickening,  distention, or inflammatory changes. Vascular/Lymphatic: Mild aortic atherosclerosis. No aneurysmal dilatation. No significantly enlarged lymph nodes Reproductive: Prostate is unremarkable. Other: Negative for free air or free fluid. Skin thickening and mild edema within the anterior subcutaneous fat. Small fat containing periumbilical hernia Musculoskeletal: Degenerative changes. No acute or suspicious lesion. Review of the MIP images confirms the above findings. IMPRESSION: 1. Negative for acute pulmonary embolus. 2. 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 Recommend annual imaging followup by CTA or MRA. This recommendation follows 2010 ACCF/AHA/AATS/ACR/ASA/SCA/SCAI/SIR/STS/SVM Guidelines for the  Diagnosis and Management of Patients with Thoracic Aortic Disease. Circulation. 2010; 121: W263-Z858 3. Patchy bilateral hazy parenchymal attenuation in the lungs which may be secondary to diffuse atelectasis, small airways disease, or less likely edema 4. No CT evidence for acute intra-abdominal or pelvic abnormality. Electronically Signed   By: Donavan Foil M.D.   On: 10/21/2017 17:36    EKG: Independently reviewed. Sinus rhythm.   Assessment/Plan  1. Dyspnea with bronchospasm  - Presents with SOB, wheezing, productive cough despite completing a course of doxycycline and then prednisone taper  - Mild leukocytosis on steroids, no fever, CTA chest neg for PE but notable for patchy bilateral airspace disease possibly reflective of small airways disease or less-likely edema  - Treated in ED with nebs and 125 mg IV Solu-Medrol  - Respiratory virus panel pending, continue droplet precautions  - Continue systemic steroid, prn nebs, prn supplemental O2   2. Acute on chronic diastolic CHF  - Appears to be hypervolemic with peripheral edema   - BNP is mildly elevated; imaging with diffuse patchy airspace disease, possibly reflecting edema versus atypical infection  - Echo from November 2016 with preserved EF, regional AK, mild AR, and grade 1 diastolic dysfunction  - Treated with Lasix 40 mg IV x1 on admission  - SLIV, follow I/Os and daily wts, update echo, continue beta-blocker   3. Hypertension  - BP at goal  - Continue Norvasc, benazepril, HCTZ, Toprol   4. Hyperglycemia  - Serum glucose is 189 on admission, likely secondary to steroids  - A1c was 5.8% two months ago  - He will be continued on systemic steroid  - Check CBG's, start SSI with Novolog   5. Anxiety - Stable, continue prn Xanax    DVT prophylaxis: Lovenox  Code Status: Full  Family Communication: Discussed with patient Disposition Plan: Observe on telemetry Consults called: None Admission status: Observation      Vianne Bulls, MD [facility] Pager [phone]  If 7PM-7AM, please contact night-coverage [email or web address] Password [email or web address]  10/22/2017, 2:17 AM

## 2017-10-22 NOTE — Progress Notes (Signed)
Called 2D echo regarding pt testing, technician stated pt will have his 2D echo tomorrow morning  Pt aware

## 2017-10-23 ENCOUNTER — Inpatient Hospital Stay (HOSPITAL_COMMUNITY): Payer: BLUE CROSS/BLUE SHIELD

## 2017-10-23 DIAGNOSIS — J9801 Acute bronchospasm: Secondary | ICD-10-CM | POA: Diagnosis not present

## 2017-10-23 DIAGNOSIS — I361 Nonrheumatic tricuspid (valve) insufficiency: Secondary | ICD-10-CM | POA: Diagnosis not present

## 2017-10-23 LAB — CBC WITH DIFFERENTIAL/PLATELET
BASOS PCT: 0 %
Basophils Absolute: 0 10*3/uL (ref 0.0–0.1)
EOS ABS: 0 10*3/uL (ref 0.0–0.7)
Eosinophils Relative: 0 %
HCT: 39 % (ref 39.0–52.0)
Hemoglobin: 12.3 g/dL — ABNORMAL LOW (ref 13.0–17.0)
LYMPHS ABS: 1.6 10*3/uL (ref 0.7–4.0)
Lymphocytes Relative: 10 %
MCH: 30.4 pg (ref 26.0–34.0)
MCHC: 31.5 g/dL (ref 30.0–36.0)
MCV: 96.3 fL (ref 78.0–100.0)
MONOS PCT: 4 %
Monocytes Absolute: 0.6 10*3/uL (ref 0.1–1.0)
Neutro Abs: 13.6 10*3/uL — ABNORMAL HIGH (ref 1.7–7.7)
Neutrophils Relative %: 86 %
PLATELETS: 328 10*3/uL (ref 150–400)
RBC: 4.05 MIL/uL — ABNORMAL LOW (ref 4.22–5.81)
RDW: 14.4 % (ref 11.5–15.5)
WBC: 15.8 10*3/uL — AB (ref 4.0–10.5)

## 2017-10-23 LAB — GLUCOSE, CAPILLARY
GLUCOSE-CAPILLARY: 122 mg/dL — AB (ref 65–99)
GLUCOSE-CAPILLARY: 197 mg/dL — AB (ref 65–99)
GLUCOSE-CAPILLARY: 138 mg/dL — AB (ref 65–99)

## 2017-10-23 LAB — BASIC METABOLIC PANEL
Anion gap: 9 (ref 5–15)
BUN: 22 mg/dL — AB (ref 6–20)
CALCIUM: 8.9 mg/dL (ref 8.9–10.3)
CO2: 30 mmol/L (ref 22–32)
CREATININE: 0.83 mg/dL (ref 0.61–1.24)
Chloride: 97 mmol/L — ABNORMAL LOW (ref 101–111)
GFR calc Af Amer: >60 mL/min (ref >60)
Glucose, Bld: 137 mg/dL — ABNORMAL HIGH (ref 65–99)
Potassium: 4 mmol/L (ref 3.5–5.1)
SODIUM: 136 mmol/L (ref 135–145)

## 2017-10-23 NOTE — Progress Notes (Signed)
  Echocardiogram 2D Echocardiogram has been performed.  Jeremy [name] [name] 10/23/2017, 12:09 PM

## 2017-10-23 NOTE — Progress Notes (Signed)
PROGRESS NOTE    Jeremy [name]  LFY:[id number] DOB: [birth year] DOA: 10/21/2017 PCP: [name], NP   Brief Narrative:62 y.o. male with medical history significant for chronic diastolic CHF, hypertension, and anxiety, now presenting to the emergency department for evaluation of shortness of breath, cough, and wheezing.  Patient reports that he quit smoking roughly 40 years ago, denies history of COPD or asthma, but reports close to 1 month of dyspnea with wheezing and cough productive of thick white sputum.  He has completed a course of doxycycline prednisone taper reports that he improved some with each of these, but then re-worsens.  He also reports fleeting improvement after a neb treatment.  He describes orthopnea for the past couple of weeks and mild bilateral lower extremity edema.  Denies chest pain, palpitations, or fevers.  [facility] ED Course: Upon arrival to the ED, patient is found to be afebrile, saturating low 90s on room air, slightly tachypneic, and vitals otherwise stable.  EKG features a sinus rhythm bilateral mild interstitial prominence, possibly reflecting edema or infection.  Chemistry panel is notable for serum glucose of 189 and slight elevation BC features a mild anemia and leukocytosis to 12,200.  BNP is elevated to 123 and troponin is undetectable.  CTA chest is negative for acute PE, but notable for patchy bilateral haziness suggestive of small airways disease or less likely edema.  Respiratory viral panel remains pending patient was treated with continuous albuterol neb, DuoNeb, and 125 mg of IV Solu-Medrol.  He remains hemodynamically stable, is not in acute distress, and is observed on the telemetry unit for ongoing evaluation and management of dyspnea with productive cough, wheeze, and orthopnea.    Assessment & Plan:   Principal Problem:   Bronchospasm, acute Active Problems:   HTN (hypertension)   Hyperglycemia   OSA (obstructive sleep  apnea)   Anxiety and depression   Chronic diastolic heart failure (HCC)   Acute on chronic diastolic CHF (congestive heart failure) (HCC)   Dyspnea  1] reactive airway disease patient presented with shortness of breath wheezing and productive cough-exposure to fumes and chemicals at work.?.  Patient has no history of asthma or COPD.  Viral panel here has been negative.  Continue IV Solu-Medrol nebulizer treatments and oxygen.  Taper oxygen.  Ambulate patient.  2] chronic diastolic heart failure-patient had mildly elevated BNP and mild lower extremity edema which has been cleared with 1 dose of Lasix in the emergency room.  Echo in 2016 November showed normal ejection fraction.  3] atypical pleuritic chest pain patient reports having chest pain for over 5 years.  I have discussed with him to follow-up with his cardiologist as an outpatient for possible stress test.  I will order an echocardiogram while he is in here.  Patient was supposed to have a stress test as an outpatient 2 years ago it does not look like he had a stress test done.  I will have him follow-up with cardiology as an outpatient and get an echocardiogram here.  4] hypertension continue triple regime.  5] steroid-induced hyperglycemia continue with SSI blood sugar stable  6] history of anxiety continue Xanax as needed.    DVT prophylaxis: Lovenox  code Status: Full code Family Communication: No family available Disposition Plan: Plan to discharge in 24-48 hours Consultants: None  Procedures: None Antimicrobials: None  Subjective: Feels better   Objective: Vitals:   10/22/17 1300 10/22/17 2015 10/23/17 0000 10/23/17 0519  BP: 120/73 115/67 (!) 142/85 Marland Kitchen)  147/83  Pulse: 70 63 63 (!) 58  Resp: 18 20 20 20   Temp: 97.8 F (36.6 C) 98.2 F (36.8 C) 97.6 F (36.4 C) (!) 97.4 F (36.3 C)  TempSrc: Oral Oral Oral Oral  SpO2: 97% 97% 93% 97%  Weight:    119.4 kg (263 lb 4.8 oz)  Height:        Intake/Output  Summary (Last 24 hours) at 10/23/2017 1106 Last data filed at 10/23/2017 0900 Gross per 24 hour  Intake 960 ml  Output 1350 ml  Net -390 ml   Filed Weights   10/22/17 0048 10/23/17 0519  Weight: 121.8 kg (268 lb 9.6 oz) 119.4 kg (263 lb 4.8 oz)    Examination:  General exam: Appears calm and comfortable  Respiratory system: Clear to auscultation. Respiratory effort normal. Cardiovascular system: S1 & S2 heard, RRR. No JVD, murmurs, rubs, gallops or clicks. No pedal edema. Gastrointestinal system: Abdomen is nondistended, soft and nontender. No organomegaly or masses felt. Normal bowel sounds heard. Central nervous system: Alert and oriented. No focal neurological deficits. Extremities: Symmetric 5 x 5 power. Skin: No rashes, lesions or ulcers Psychiatry: Judgement and insight appear normal. Mood & affect appropriate.     Data Reviewed: I have personally reviewed following labs and imaging studies  CBC: Recent Labs  Lab 10/21/17 1426 10/22/17 0316 10/23/17 0556  WBC 12.2* 12.0* 15.8*  NEUTROABS 10.2* 11.0* 13.6*  HGB 11.9* 11.9* 12.3*  HCT 36.8* 37.7* 39.0  MCV 95.1 95.4 96.3  PLT 314 302 458   Basic Metabolic Panel: Recent Labs  Lab 10/21/17 1426 10/22/17 0316 10/23/17 0556  NA 136 137 136  K 4.8 4.8 4.0  CL 102 99* 97*  CO2 27 27 30   GLUCOSE 189* 246* 137*  BUN 20 16 22*  CREATININE 0.81 0.99 0.83  CALCIUM 8.7* 8.9 8.9   GFR: Estimated Creatinine Clearance: 114.1 mL/min (by C-G formula based on SCr of 0.83 mg/dL). Liver Function Tests: Recent Labs  Lab 10/21/17 1426 10/22/17 0316  AST 45* 36  ALT 83* 76*  ALKPHOS 74 73  BILITOT 0.5 0.8  PROT 6.9 6.2*  ALBUMIN 3.7 3.4*   No results for input(s): LIPASE, AMYLASE in the last 168 hours. No results for input(s): AMMONIA in the last 168 hours. Coagulation Profile: No results for input(s): INR, PROTIME in the last 168 hours. Cardiac Enzymes: Recent Labs  Lab 10/21/17 1426  TROPONINI <0.03   BNP  (last 3 results) No results for input(s): PROBNP in the last 8760 hours. HbA1C: No results for input(s): HGBA1C in the last 72 hours. CBG: Recent Labs  Lab 10/22/17 0137 10/22/17 0741 10/22/17 1142 10/22/17 1631 10/22/17 2107  GLUCAP 153* 119* 125* 122* 156*   Lipid Profile: No results for input(s): CHOL, HDL, LDLCALC, TRIG, CHOLHDL, LDLDIRECT in the last 72 hours. Thyroid Function Tests: No results for input(s): TSH, T4TOTAL, FREET4, T3FREE, THYROIDAB in the last 72 hours. Anemia Panel: No results for input(s): VITAMINB12, FOLATE, FERRITIN, TIBC, IRON, RETICCTPCT in the last 72 hours. Sepsis Labs: No results for input(s): PROCALCITON, LATICACIDVEN in the last 168 hours.  Recent Results (from the past 240 hour(s))  Respiratory Panel by PCR     Status: None   Collection Time: 10/22/17 12:51 AM  Result Value Ref Range Status   Adenovirus NOT DETECTED NOT DETECTED Final   Coronavirus 229E NOT DETECTED NOT DETECTED Final   Coronavirus HKU1 NOT DETECTED NOT DETECTED Final   Coronavirus NL63 NOT DETECTED NOT DETECTED Final  Coronavirus OC43 NOT DETECTED NOT DETECTED Final   Metapneumovirus NOT DETECTED NOT DETECTED Final   Rhinovirus / Enterovirus NOT DETECTED NOT DETECTED Final   Influenza A NOT DETECTED NOT DETECTED Final   Influenza B NOT DETECTED NOT DETECTED Final   Parainfluenza Virus 1 NOT DETECTED NOT DETECTED Final   Parainfluenza Virus 2 NOT DETECTED NOT DETECTED Final   Parainfluenza Virus 3 NOT DETECTED NOT DETECTED Final   Parainfluenza Virus 4 NOT DETECTED NOT DETECTED Final   Respiratory Syncytial Virus NOT DETECTED NOT DETECTED Final   Bordetella pertussis NOT DETECTED NOT DETECTED Final   Chlamydophila pneumoniae NOT DETECTED NOT DETECTED Final   Mycoplasma pneumoniae NOT DETECTED NOT DETECTED Final         Radiology Studies: Dg Chest 2 View  Result Date: 10/21/2017 CLINICAL DATA:  Shortness of breath EXAM: CHEST  2 VIEW COMPARISON:  10/12/2017,  10/05/2015 FINDINGS: There is bilateral mild interstitial prominence. There is no focal parenchymal opacity. There is no pleural effusion or pneumothorax. The heart and mediastinal contours are unremarkable. The osseous structures are unremarkable. IMPRESSION: Bilateral mild interstitial prominence which may reflect interstitial edema versus interstitial infection. Electronically Signed   By: Kathreen Devoid   On: 10/21/2017 15:09   Ct Angio Chest Pe W And/or Wo Contrast  Result Date: 10/21/2017 CLINICAL DATA:  Lower abdominal pain with urinary burning, short of breath 2-3 weeks EXAM: CT ANGIOGRAPHY CHEST CT ABDOMEN AND PELVIS WITH CONTRAST TECHNIQUE: Multidetector CT imaging of the chest was performed using the standard protocol during bolus administration of intravenous contrast. Multiplanar CT image reconstructions and MIPs were obtained to evaluate the vascular anatomy. Multidetector CT imaging of the abdomen and pelvis was performed using the standard protocol during bolus administration of intravenous contrast. CONTRAST:  185mL ISOVUE-370 IOPAMIDOL (ISOVUE-370) INJECTION 76% COMPARISON:  Chest x-ray 10/21/2017, report 07/27/2013 FINDINGS: CTA CHEST FINDINGS Cardiovascular: Satisfactory opacification of the pulmonary arteries to the segmental level. No evidence of pulmonary embolism. Mild aneurysmal dilatation of the ascending aorta, measuring up to 4.1 cm. Common origin of the right brachiocephalic and left common carotid artery. Mild aortic atherosclerosis. Heart size within normal limits. No pericardial effusion. Mediastinum/Nodes: No enlarged mediastinal, hilar, or axillary lymph nodes. Thyroid gland, trachea, and esophagus demonstrate no significant findings. Lungs/Pleura: Bilateral mosaic attenuation. No pleural effusion or pneumothorax. Musculoskeletal: No chest wall abnormality. No acute or significant osseous findings. Review of the MIP images confirms the above findings. CT ABDOMEN and PELVIS FINDINGS  Hepatobiliary: Subcentimeter hypodensities in the liver too small to further characterize. No calcified gallstones or biliary dilatation Pancreas: Unremarkable. No pancreatic ductal dilatation or surrounding inflammatory changes. Spleen: Normal in size without focal abnormality. Adrenals/Urinary Tract: Adrenal glands are within normal limits. No hydronephrosis. Nonspecific perinephric fat stranding. The bladder is within limits Stomach/Bowel: Stomach is within normal limits. Appendix appears normal. No evidence of bowel wall thickening, distention, or inflammatory changes. Vascular/Lymphatic: Mild aortic atherosclerosis. No aneurysmal dilatation. No significantly enlarged lymph nodes Reproductive: Prostate is unremarkable. Other: Negative for free air or free fluid. Skin thickening and mild edema within the anterior subcutaneous fat. Small fat containing periumbilical hernia Musculoskeletal: Degenerative changes. No acute or suspicious lesion. Review of the MIP images confirms the above findings. IMPRESSION: 1. Negative for acute pulmonary embolus. 2. 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 Recommend annual imaging followup by CTA or MRA. This recommendation follows 2010 ACCF/AHA/AATS/ACR/ASA/SCA/SCAI/SIR/STS/SVM Guidelines for the Diagnosis and Management of Patients with Thoracic Aortic Disease. Circulation. 2010; 121:  W098-J191 3. Patchy bilateral hazy parenchymal attenuation in the lungs which may be secondary to diffuse atelectasis, small airways disease, or less likely edema 4. No CT evidence for acute intra-abdominal or pelvic abnormality. Electronically Signed   By: Donavan Foil M.D.   On: 10/21/2017 17:36   Ct Abdomen Pelvis W Contrast  Result Date: 10/21/2017 CLINICAL DATA:  Lower abdominal pain with urinary burning, short of breath 2-3 weeks EXAM: CT ANGIOGRAPHY CHEST CT ABDOMEN AND PELVIS WITH CONTRAST TECHNIQUE: Multidetector CT imaging of the chest was performed  using the standard protocol during bolus administration of intravenous contrast. Multiplanar CT image reconstructions and MIPs were obtained to evaluate the vascular anatomy. Multidetector CT imaging of the abdomen and pelvis was performed using the standard protocol during bolus administration of intravenous contrast. CONTRAST:  155mL ISOVUE-370 IOPAMIDOL (ISOVUE-370) INJECTION 76% COMPARISON:  Chest x-ray 10/21/2017, report 07/27/2013 FINDINGS: CTA CHEST FINDINGS Cardiovascular: Satisfactory opacification of the pulmonary arteries to the segmental level. No evidence of pulmonary embolism. Mild aneurysmal dilatation of the ascending aorta, measuring up to 4.1 cm. Common origin of the right brachiocephalic and left common carotid artery. Mild aortic atherosclerosis. Heart size within normal limits. No pericardial effusion. Mediastinum/Nodes: No enlarged mediastinal, hilar, or axillary lymph nodes. Thyroid gland, trachea, and esophagus demonstrate no significant findings. Lungs/Pleura: Bilateral mosaic attenuation. No pleural effusion or pneumothorax. Musculoskeletal: No chest wall abnormality. No acute or significant osseous findings. Review of the MIP images confirms the above findings. CT ABDOMEN and PELVIS FINDINGS Hepatobiliary: Subcentimeter hypodensities in the liver too small to further characterize. No calcified gallstones or biliary dilatation Pancreas: Unremarkable. No pancreatic ductal dilatation or surrounding inflammatory changes. Spleen: Normal in size without focal abnormality. Adrenals/Urinary Tract: Adrenal glands are within normal limits. No hydronephrosis. Nonspecific perinephric fat stranding. The bladder is within limits Stomach/Bowel: Stomach is within normal limits. Appendix appears normal. No evidence of bowel wall thickening, distention, or inflammatory changes. Vascular/Lymphatic: Mild aortic atherosclerosis. No aneurysmal dilatation. No significantly enlarged lymph nodes Reproductive:  Prostate is unremarkable. Other: Negative for free air or free fluid. Skin thickening and mild edema within the anterior subcutaneous fat. Small fat containing periumbilical hernia Musculoskeletal: Degenerative changes. No acute or suspicious lesion. Review of the MIP images confirms the above findings. IMPRESSION: 1. Negative for acute pulmonary embolus. 2. 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 Recommend annual imaging followup by CTA or MRA. This recommendation follows 2010 ACCF/AHA/AATS/ACR/ASA/SCA/SCAI/SIR/STS/SVM Guidelines for the Diagnosis and Management of Patients with Thoracic Aortic Disease. Circulation. 2010; 121: Y782-N562 3. Patchy bilateral hazy parenchymal attenuation in the lungs which may be secondary to diffuse atelectasis, small airways disease, or less likely edema 4. No CT evidence for acute intra-abdominal or pelvic abnormality. Electronically Signed   By: Donavan Foil M.D.   On: 10/21/2017 17:36        Scheduled Meds: . amLODipine  10 mg Oral Daily  . B-complex with vitamin C  1 tablet Oral Daily  . benazepril  40 mg Oral Daily  . enoxaparin (LOVENOX) injection  40 mg Subcutaneous Q24H  . insulin aspart  0-5 Units Subcutaneous QHS  . insulin aspart  0-9 Units Subcutaneous TID WC  . loratadine  10 mg Oral Daily  . methylPREDNISolone (SOLU-MEDROL) injection  40 mg Intravenous Q8H  . metoprolol succinate  100 mg Oral Daily  . multivitamin with minerals  1 tablet Oral Daily  . pantoprazole  40 mg Oral Daily  . sodium chloride flush  3 mL Intravenous  Q12H  . sodium chloride flush  3 mL Intravenous Q12H   Continuous Infusions: . sodium chloride       LOS: 1 day       Georgette Shell, MD [facility]  If 7PM-7AM, please contact night-coverage [email or web address] Password [email or web address] 10/23/2017, 11:06 AM

## 2017-10-23 NOTE — Progress Notes (Signed)
No signs distress to report this night shift.  CBG WNL no coverage needed.

## 2017-10-23 NOTE — Plan of Care (Signed)
  Progressing Safety: Ability to remain free from injury will improve 10/23/2017 0353 - Progressing by Ardine Eng, RN

## 2017-10-24 ENCOUNTER — Other Ambulatory Visit (HOSPITAL_COMMUNITY): Payer: BLUE CROSS/BLUE SHIELD

## 2017-10-24 ENCOUNTER — Telehealth: Payer: Self-pay | Admitting: Family

## 2017-10-24 DIAGNOSIS — J9801 Acute bronchospasm: Secondary | ICD-10-CM | POA: Diagnosis not present

## 2017-10-24 LAB — GLUCOSE, CAPILLARY
GLUCOSE-CAPILLARY: 131 mg/dL — AB (ref 65–99)
GLUCOSE-CAPILLARY: 123 mg/dL — AB (ref 65–99)
Glucose-Capillary: 126 mg/dL — ABNORMAL HIGH (ref 65–99)
Glucose-Capillary: 123 mg/dL — ABNORMAL HIGH (ref 65–99)
Glucose-Capillary: 123 mg/dL — ABNORMAL HIGH (ref 65–99)

## 2017-10-24 LAB — ECHOCARDIOGRAM COMPLETE
HEIGHTINCHES: 67 in
WEIGHTICAEL: 4212.8 [oz_av]

## 2017-10-24 MED ORDER — METHYLPREDNISOLONE SODIUM SUCC 40 MG IJ SOLR: 40.0000 mg | Freq: Every day | INTRAMUSCULAR | Status: DC

## 2017-10-24 MED ORDER — INSULIN ASPART 100 UNIT/ML ~~LOC~~ SOLN: 0.0000 [IU] | Freq: Three times a day (TID) | SUBCUTANEOUS | Status: DC

## 2017-10-24 NOTE — Progress Notes (Signed)
PROGRESS NOTE    Jeremy [name]  PNT:[id number] DOB: [birth year] DOA: 10/21/2017 PCP: [name], NP  Brief [name] medical history significant forchronic diastolic CHF, hypertension, and anxiety, now presenting to the emergency department for evaluation of shortness of breath, cough, and wheezing. Patient reports that he quit smoking roughly 40 years ago, denies history of COPD or asthma, but reports close to 1 month of dyspnea with wheezing and cough productive of thick white sputum. He has completed a course of doxycycline prednisone taper reports that he improved some with each of these, but then re-worsens. He also reports fleeting improvement after a neb treatment. He describes orthopnea for the past couple of weeks and mild bilateral lower extremity edema. Denies chest pain, palpitations, or fevers.  [facility], patient is found to be afebrile, saturating low 90s on room air, slightly tachypneic, and vitals otherwise stable. EKG features a sinus rhythm bilateral mild interstitial prominence, possibly reflecting edema or infection. Chemistry panel is notable for serum glucose of 189 and slight elevation BC features a mild anemia and leukocytosis to 12,200. BNP is elevated to 123 and troponin is undetectable. CTA chest is negative for acute PE, but notable for patchy bilateral haziness suggestive of small airways disease or less likely edema. Respiratory viral panel remains pending patient was treated with continuous albuterol neb, DuoNeb, and 125 mg of IV Solu-Medrol. He remains hemodynamically stable, is not in acute distress, and is observed on the telemetry unit for ongoing evaluation and management of dyspnea with productive cough, wheeze, and orthopnea.    Assessment & Plan:   Principal Problem:   Bronchospasm, acute Active Problems:   HTN (hypertension)   Hyperglycemia   OSA (obstructive sleep  apnea)   Anxiety and depression   Chronic diastolic heart failure (HCC)   Acute on chronic diastolic CHF (congestive heart failure) (HCC)   Dyspnea  1] reactive airway disease patient presented with shortness of breath wheezing and productive cough-exposure to fumes and chemicals at work.?.  Patient has no history of asthma or COPD.  Viral panel here has been negative.  Continue IV Solu-Medrol nebulizer treatments and oxygen.  Taper oxygen.  Ambulate patient.  Taper Solu-Medrol to once a day.  2] chronic diastolic heart failure-patient had mildly elevated BNP and mild lower extremity edema which has been cleared with 1 dose of Lasix in the emergency room.  Echo in 2016 November showed normal ejection fraction.  Echo done yesterday results pending.  3] atypical pleuritic chest pain patient reports having chest pain for over 5 years.  I have discussed with him to follow-up with his cardiologist as an outpatient for possible stress test.  I will order an echocardiogram while he is in here.  Patient was supposed to have a stress test as an outpatient 2 years ago it does not look like he had a stress test done.  I will have him follow-up with cardiology as an outpatient and get an echocardiogram here.  4] hypertension continue triple regime.  5] steroid-induced hyperglycemia continue with SSI blood sugar stable  6] history of anxiety continue Xanax as needed.       DVT prophylaxis: Lovenox Code Status: Full code Family Communication: Discussed with his wife who was in the room. Disposition Plan: Plan discharge in next 24-48 hours.  Consultants: None  Procedures: None Antimicrobials: None  Subjective: Feels better   Objective: Vitals:   10/23/17 1200 10/23/17 1924 10/24/17 0510 10/24/17 0902  BP:  110/68 138/77 (!) 145/70 128/65  Pulse: (!) 56 62 (!) 55 65  Resp: 18 18 18    Temp: 98.4 F (36.9 C) 97.8 F (36.6 C) 98.2 F (36.8 C)   TempSrc: Oral Oral Oral   SpO2: 97% 97%  98% 95%  Weight:   118.8 kg (262 lb)   Height:        Intake/Output Summary (Last 24 hours) at 10/24/2017 1005 Last data filed at 10/24/2017 0846 Gross per 24 hour  Intake 720 ml  Output 400 ml  Net 320 ml   Filed Weights   10/22/17 0048 10/23/17 0519 10/24/17 0510  Weight: 121.8 kg (268 lb 9.6 oz) 119.4 kg (263 lb 4.8 oz) 118.8 kg (262 lb)    Examination:  General exam: Appears calm and comfortable  Respiratory system: rhonchi to auscultation. Respiratory effort normal. Cardiovascular system: S1 & S2 heard, RRR. No JVD, murmurs, rubs, gallops or clicks. No pedal edema. Gastrointestinal system: Abdomen is nondistended, soft and nontender. No organomegaly or masses felt. Normal bowel sounds heard. Central nervous system: Alert and oriented. No focal neurological deficits. Extremities: Symmetric 5 x 5 power. Skin: No rashes, lesions or ulcers Psychiatry: Judgement and insight appear normal. Mood & affect appropriate.     Data Reviewed: I have personally reviewed following labs and imaging studies  CBC: Recent Labs  Lab 10/21/17 1426 10/22/17 0316 10/23/17 0556  WBC 12.2* 12.0* 15.8*  NEUTROABS 10.2* 11.0* 13.6*  HGB 11.9* 11.9* 12.3*  HCT 36.8* 37.7* 39.0  MCV 95.1 95.4 96.3  PLT 314 302 604   Basic Metabolic Panel: Recent Labs  Lab 10/21/17 1426 10/22/17 0316 10/23/17 0556  NA 136 137 136  K 4.8 4.8 4.0  CL 102 99* 97*  CO2 27 27 30   GLUCOSE 189* 246* 137*  BUN 20 16 22*  CREATININE 0.81 0.99 0.83  CALCIUM 8.7* 8.9 8.9   GFR: Estimated Creatinine Clearance: 113.8 mL/min (by C-G formula based on SCr of 0.83 mg/dL). Liver Function Tests: Recent Labs  Lab 10/21/17 1426 10/22/17 0316  AST 45* 36  ALT 83* 76*  ALKPHOS 74 73  BILITOT 0.5 0.8  PROT 6.9 6.2*  ALBUMIN 3.7 3.4*   No results for input(s): LIPASE, AMYLASE in the last 168 hours. No results for input(s): AMMONIA in the last 168 hours. Coagulation Profile: No results for input(s): INR, PROTIME  in the last 168 hours. Cardiac Enzymes: Recent Labs  Lab 10/21/17 1426  TROPONINI <0.03   BNP (last 3 results) No results for input(s): PROBNP in the last 8760 hours. HbA1C: No results for input(s): HGBA1C in the last 72 hours. CBG: Recent Labs  Lab 10/22/17 2107 10/23/17 1222 10/23/17 1559 10/23/17 2149 10/24/17 0745  GLUCAP 156* 122* 197* 138* 131*   Lipid Profile: No results for input(s): CHOL, HDL, LDLCALC, TRIG, CHOLHDL, LDLDIRECT in the last 72 hours. Thyroid Function Tests: No results for input(s): TSH, T4TOTAL, FREET4, T3FREE, THYROIDAB in the last 72 hours. Anemia Panel: No results for input(s): VITAMINB12, FOLATE, FERRITIN, TIBC, IRON, RETICCTPCT in the last 72 hours. Sepsis Labs: No results for input(s): PROCALCITON, LATICACIDVEN in the last 168 hours.  Recent Results (from the past 240 hour(s))  Respiratory Panel by PCR     Status: None   Collection Time: 10/22/17 12:51 AM  Result Value Ref Range Status   Adenovirus NOT DETECTED NOT DETECTED Final   Coronavirus 229E NOT DETECTED NOT DETECTED Final   Coronavirus HKU1 NOT DETECTED NOT DETECTED Final   Coronavirus NL63  NOT DETECTED NOT DETECTED Final   Coronavirus OC43 NOT DETECTED NOT DETECTED Final   Metapneumovirus NOT DETECTED NOT DETECTED Final   Rhinovirus / Enterovirus NOT DETECTED NOT DETECTED Final   Influenza A NOT DETECTED NOT DETECTED Final   Influenza B NOT DETECTED NOT DETECTED Final   Parainfluenza Virus 1 NOT DETECTED NOT DETECTED Final   Parainfluenza Virus 2 NOT DETECTED NOT DETECTED Final   Parainfluenza Virus 3 NOT DETECTED NOT DETECTED Final   Parainfluenza Virus 4 NOT DETECTED NOT DETECTED Final   Respiratory Syncytial Virus NOT DETECTED NOT DETECTED Final   Bordetella pertussis NOT DETECTED NOT DETECTED Final   Chlamydophila pneumoniae NOT DETECTED NOT DETECTED Final   Mycoplasma pneumoniae NOT DETECTED NOT DETECTED Final         Radiology Studies: No results  found.      Scheduled Meds: . amLODipine  10 mg Oral Daily  . B-complex with vitamin C  1 tablet Oral Daily  . benazepril  40 mg Oral Daily  . enoxaparin (LOVENOX) injection  40 mg Subcutaneous Q24H  . insulin aspart  0-5 Units Subcutaneous QHS  . insulin aspart  0-9 Units Subcutaneous TID WC  . loratadine  10 mg Oral Daily  . methylPREDNISolone (SOLU-MEDROL) injection  40 mg Intravenous Q8H  . metoprolol succinate  100 mg Oral Daily  . multivitamin with minerals  1 tablet Oral Daily  . pantoprazole  40 mg Oral Daily  . sodium chloride flush  3 mL Intravenous Q12H  . sodium chloride flush  3 mL Intravenous Q12H   Continuous Infusions: . sodium chloride       LOS: 2 days      Georgette Shell, MD [facility] If 7PM-7AM, please contact night-coverage [email or web address] Password [email or web address] 10/24/2017, 10:05 AM

## 2017-10-24 NOTE — Progress Notes (Signed)
Ambulated twice with pt today length of hallway. Pt tolerated well and did not require oxygen.

## 2017-10-24 NOTE — Progress Notes (Signed)
SATURATION QUALIFICATIONS: (This note is used to comply with regulatory documentation for home oxygen)  Patient Saturations on Room Air at Rest = 94%  Patient Saturations on Room Air while Ambulating = 92-90%

## 2017-10-24 NOTE — Telephone Encounter (Signed)
Copied from [place]. Topic: Quick Communication - See Telephone Encounter >> Oct 24, 2017  5:22 PM Jeremy [name] wrote: Pt saw [name] and is now in [facility] hospital.  Pt needs to discuss the care about his lungs.

## 2017-10-25 ENCOUNTER — Telehealth: Payer: Self-pay | Admitting: Family

## 2017-10-25 DIAGNOSIS — J9801 Acute bronchospasm: Secondary | ICD-10-CM | POA: Diagnosis not present

## 2017-10-25 LAB — BASIC METABOLIC PANEL
ANION GAP: 9 (ref 5–15)
BUN: 25 mg/dL — ABNORMAL HIGH (ref 6–20)
CALCIUM: 8.6 mg/dL — AB (ref 8.9–10.3)
CHLORIDE: 101 mmol/L (ref 101–111)
CO2: 30 mmol/L (ref 22–32)
Creatinine, Ser: 1 mg/dL (ref 0.61–1.24)
GFR calc non Af Amer: >60 mL/min (ref >60)
Glucose, Bld: 83 mg/dL (ref 65–99)
Potassium: 3.7 mmol/L (ref 3.5–5.1)
Sodium: 140 mmol/L (ref 135–145)

## 2017-10-25 LAB — CBC WITH DIFFERENTIAL/PLATELET
BASOS ABS: 0 10*3/uL (ref 0.0–0.1)
BASOS PCT: 0 %
Eosinophils Absolute: 0.1 10*3/uL (ref 0.0–0.7)
Eosinophils Relative: 1 %
HEMATOCRIT: 40.5 % (ref 39.0–52.0)
Hemoglobin: 12.8 g/dL — ABNORMAL LOW (ref 13.0–17.0)
Lymphocytes Relative: 24 %
Lymphs Abs: 2.9 10*3/uL (ref 0.7–4.0)
MCH: 30 pg (ref 26.0–34.0)
MCHC: 31.6 g/dL (ref 30.0–36.0)
MCV: 95.1 fL (ref 78.0–100.0)
Monocytes Absolute: 1 10*3/uL (ref 0.1–1.0)
Monocytes Relative: 8 %
NEUTROS ABS: 8.2 10*3/uL — AB (ref 1.7–7.7)
NEUTROS PCT: 67 %
Platelets: 312 10*3/uL (ref 150–400)
RBC: 4.26 MIL/uL (ref 4.22–5.81)
RDW: 14.1 % (ref 11.5–15.5)
WBC: 12.1 10*3/uL — AB (ref 4.0–10.5)

## 2017-10-25 LAB — GLUCOSE, CAPILLARY: Glucose-Capillary: 79 mg/dL (ref 65–99)

## 2017-10-25 MED ORDER — POTASSIUM CHLORIDE CRYS ER 10 MEQ PO TBCR: 10.0000 meq | EXTENDED_RELEASE_TABLET | Freq: Every day | ORAL | 1 refills | Status: DC

## 2017-10-25 MED ORDER — FUROSEMIDE 20 MG PO TABS: 20.0000 mg | ORAL_TABLET | Freq: Every day | ORAL | 11 refills | Status: DC

## 2017-10-25 MED ORDER — BENZONATATE 100 MG PO CAPS: 100.0000 mg | ORAL_CAPSULE | Freq: Three times a day (TID) | ORAL | 0 refills | Status: DC | PRN

## 2017-10-25 MED ORDER — AMLODIPINE BESY-BENAZEPRIL HCL 10-40 MG PO CAPS: 1.0000 | ORAL_CAPSULE | Freq: Every day | ORAL | 3 refills | Status: DC

## 2017-10-25 MED ORDER — FUROSEMIDE 20 MG PO TABS: 20.0000 mg | ORAL_TABLET | Freq: Every day | ORAL | 0 refills | Status: DC

## 2017-10-25 MED ORDER — SERTRALINE HCL 100 MG PO TABS: 150.0000 mg | ORAL_TABLET | Freq: Every day | ORAL | 0 refills | Status: DC

## 2017-10-25 MED ORDER — FEXOFENADINE HCL 180 MG PO TABS: 180.0000 mg | ORAL_TABLET | Freq: Every day | ORAL | 0 refills | Status: AC

## 2017-10-25 MED ORDER — ALPRAZOLAM 0.5 MG PO TABS: 0.2500 mg | ORAL_TABLET | Freq: Two times a day (BID) | ORAL | 0 refills | Status: DC | PRN

## 2017-10-25 MED ORDER — PREDNISONE 10 MG PO TABS: 10.0000 mg | ORAL_TABLET | Freq: Every day | ORAL | 0 refills | Status: DC

## 2017-10-25 MED ORDER — POTASSIUM CHLORIDE CRYS ER 10 MEQ PO TBCR: 10.0000 meq | EXTENDED_RELEASE_TABLET | Freq: Every day | ORAL | 0 refills | Status: DC

## 2017-10-25 MED ORDER — FERROUS SULFATE 325 (65 FE) MG PO TABS: 325.0000 mg | ORAL_TABLET | Freq: Two times a day (BID) | ORAL | 0 refills | Status: DC

## 2017-10-25 MED ORDER — ARIPIPRAZOLE 15 MG PO TABS: 7.5000 mg | ORAL_TABLET | Freq: Every day | ORAL | 0 refills | Status: DC

## 2017-10-25 MED ORDER — METOPROLOL SUCCINATE ER 100 MG PO TB24: 100.0000 mg | ORAL_TABLET | Freq: Every day | ORAL | 2 refills | Status: DC

## 2017-10-25 MED ORDER — SILDENAFIL CITRATE 50 MG PO TABS: 50.0000 mg | ORAL_TABLET | Freq: Every day | ORAL | 0 refills | Status: DC | PRN

## 2017-10-25 NOTE — Discharge Summary (Signed)
Physician Discharge Summary  Jeremy [name] VFI:[id number] DOB: 11-24-1955 DOA: 10/21/2017  PCP: [name], NP  Admit date: 10/21/2017 Discharge date: 10/25/2017  Admitted From: Home Disposition: Home Recommendations for Outpatient Follow-up:  1. Follow up with PCP in 1-2 weeks 2. Please obtain BMP/CBC in one week 3. Follow-up chest x-ray in 2 weeks.  Home Health: None Equipment/Devices none Discharge Condition: Stable CODE STATUS: Full code Diet recommendation: Cardiac diet Brief/Interim Summary:62 y.o.malewith medical history significant forchronic diastolic CHF, hypertension, and anxiety, now presenting to the emergency department for evaluation of shortness of breath, cough, and wheezing. Patient reports that he quit smoking roughly 40 years ago, denies history of COPD or asthma, but reports close to 1 month of dyspnea with wheezing and cough productive of thick white sputum. He has completed a course of doxycycline prednisone taper reports that he improved some with each of these, but then re-worsens. He also reports fleeting improvement after a neb treatment. He describes orthopnea for the past couple of weeks and mild bilateral lower extremity edema. Denies chest pain, palpitations, or fevers.  [facility]Upon arrival to the ED, patient is found to be afebrile, saturating low 90s on room air, slightly tachypneic, and vitals otherwise stable. EKG features a sinus rhythm bilateral mild interstitial prominence, possibly reflecting edema or infection. Chemistry panel is notable for serum glucose of 189 and slight elevation BC features a mild anemia and leukocytosis to 12,200. BNP is elevated to 123 and troponin is undetectable. CTA chest is negative for acute PE, but notable for patchy bilateral haziness suggestive of small airways disease or less likely edema. Respiratory viral panel remains pending patient was treated with continuous albuterol  neb, DuoNeb, and 125 mg of IV Solu-Medrol. He remains hemodynamically stable, is not in acute distress, and is observed on the telemetry unit for ongoing evaluation and management of dyspnea with productive cough, wheeze, and orthopnea.      Discharge Diagnoses:  Principal Problem:   Bronchospasm, acute Active Problems:   HTN (hypertension)   Hyperglycemia   OSA (obstructive sleep apnea)   Anxiety and depression   Chronic diastolic heart failure (HCC)   Acute on chronic diastolic CHF (congestive heart failure) ([place])   Dyspnea  1]reactive airway disease patient presented with shortness of breath wheezing and productive cough-exposure to fumes and chemicals at work.?. Patient has no history of asthma or COPD. Viral panel here has been negative.  He ambulated in the hallway yesterday without oxygen his oxygen saturation was between 90-94%.  He will be discharged on prednisone p.o.   2]chronic diastolic heart failure-patient had mildly elevated BNP and mild lower extremity edema which has been cleared with 1 dose of Lasix in the emergency room.  Patient will be discharged on Lasix 20 mg daily.  He was taking hydrochlorothiazide at home. Echo in 2016 November showed normal ejection fraction.  Echo done yesterday results shows normal ejection fraction 60-65%.  No wall motion abnormalities.  Mild pulmonary hypertension noted.  This was discussed with patient and his wife.  3]atypical pleuritic chest pain patient reports having chest pain for over 63 years. I have discussed with him to follow-up with his cardiologist as an outpatient for possible stress test.   4]hypertension continue triple regime.  I will discharge him on Lasix 20 mg daily and DC the hydrochlorothiazide he was taking at home.  5]steroid-induced hyperglycemia continue with SSI blood sugar stable  6]history of anxiety continue Xanax as needed.  Discharge Instructions  Discharge Instructions    Call MD  for:  difficulty breathing, headache or visual disturbances   Complete by:  As directed    Call MD for:  persistant dizziness or light-headedness   Complete by:  As directed    Call MD for:  persistant nausea and vomiting   Complete by:  As directed    Call MD for:  severe uncontrolled pain   Complete by:  As directed    Call MD for:  temperature >100.4   Complete by:  As directed    Diet - low sodium heart healthy   Complete by:  As directed    Increase activity slowly   Complete by:  As directed      Allergies as of 10/25/2017   No Known Allergies     Medication List    STOP taking these medications   doxycycline 100 MG tablet Commonly known as:  VIBRA-TABS     TAKE these medications   albuterol 108 (90 Base) MCG/ACT inhaler Commonly known as:  PROVENTIL HFA;VENTOLIN HFA Inhale 2 puffs into the lungs every 6 (six) hours as needed for wheezing or shortness of breath.   albuterol (2.5 MG/3ML) 0.083% nebulizer solution Commonly known as:  PROVENTIL Take 3 mLs (2.5 mg total) by nebulization every 6 (six) hours as needed for wheezing or shortness of breath.   ALPRAZolam 0.5 MG tablet Commonly known as:  XANAX Take 1 tablet (0.5 mg total) at bedtime as needed by mouth for anxiety. What changed:    how much to take  when to take this   amLODipine-benazepril 10-40 MG capsule Commonly known as:  LOTREL Take 1 capsule by mouth daily.   ARIPiprazole 15 MG tablet Commonly known as:  ABILIFY Take 7.5 mg by mouth daily.   b complex vitamins tablet Take 1 tablet by mouth daily.   benzonatate 100 MG capsule Commonly known as:  TESSALON Take 1 capsule (100 mg total) by mouth 3 (three) times daily as needed for cough.   ferrous sulfate 325 (65 FE) MG tablet Take 1 tablet (325 mg total) by mouth 2 (two) times daily with a meal. What changed:  when to take this   fexofenadine 180 MG tablet Commonly known as:  ALLEGRA Take 180 mg by mouth daily.   hydrochlorothiazide 25  MG tablet Commonly known as:  HYDRODIURIL Take 1 tablet (25 mg total) daily by mouth.   metoprolol succinate 100 MG 24 hr tablet Commonly known as:  TOPROL XL Take 1 tablet (100 mg total) by mouth daily. Take with or immediately following a meal.   multivitamin with minerals Tabs tablet Take 1 tablet by mouth daily.   omeprazole 40 MG capsule Commonly known as:  PRILOSEC Take 1 capsule (40 mg total) by mouth daily.   potassium chloride 10 MEQ tablet Commonly known as:  K-DUR,KLOR-CON TAKE 2 TABLETS DAILY   predniSONE 10 MG tablet Commonly known as:  DELTASONE Take 1 tablet (10 mg total) by mouth daily. What changed:    how much to take  how to take this  when to take this  additional instructions   sertraline 100 MG tablet Commonly known as:  ZOLOFT Take 150 mg by mouth daily.   sildenafil 50 MG tablet Commonly known as:  VIAGRA Take 1 tablet (50 mg total) by mouth daily as needed for erectile dysfunction.   VIOS AEROSOL DELIVERY SYSTEM Misc 1 each as directed.      Follow-up Information  [name], NP Follow up.   Specialty:  Internal Medicine Contact information: [place] [address] [phone]        Nahser, Wonda Cheng, MD Follow up.   Specialty:  Cardiology Contact information: [address] [place] [address] [phone]          No Known Allergies  Consultations:  None   Procedures/Studies: Dg Chest 2 View  Result Date: 10/21/2017 CLINICAL DATA:  Shortness of breath EXAM: CHEST  2 VIEW COMPARISON:  10/12/2017, 10/05/2015 FINDINGS: There is bilateral mild interstitial prominence. There is no focal parenchymal opacity. There is no pleural effusion or pneumothorax. The heart and mediastinal contours are unremarkable. The osseous structures are unremarkable. IMPRESSION: Bilateral mild interstitial prominence which may reflect interstitial edema versus interstitial infection.  Electronically Signed   By: Kathreen Devoid   On: 10/21/2017 15:09   Dg Ribs Unilateral W/chest Right  Result Date: 10/12/2017 CLINICAL DATA:  62 year old male with history of right anterior lower rib pain after fall 2 weeks ago. Chest congestion and productive cough. EXAM: RIGHT RIBS AND CHEST - 3+ VIEW COMPARISON:  Chest x-ray 08/03/2016. FINDINGS: Mild diffuse peribronchial cuffing. Lung volumes are low. No consolidative airspace disease. No pleural effusions. No pneumothorax. No pulmonary nodule or mass noted. Pulmonary vasculature and the cardiomediastinal silhouette are within normal limits. Dedicated views of the lower right ribs demonstrate no definite acute displaced right-sided rib fractures. IMPRESSION: 1. No acute displaced right-sided rib fractures. 2. Diffuse peribronchial cuffing, concerning for an acute bronchitis. Electronically Signed   By: Vinnie Langton M.D.   On: 10/12/2017 14:49   Ct Angio Chest Pe W And/or Wo Contrast  Result Date: 10/21/2017 CLINICAL DATA:  Lower abdominal pain with urinary burning, short of breath 2-3 weeks EXAM: CT ANGIOGRAPHY CHEST CT ABDOMEN AND PELVIS WITH CONTRAST TECHNIQUE: Multidetector CT imaging of the chest was performed using the standard protocol during bolus administration of intravenous contrast. Multiplanar CT image reconstructions and MIPs were obtained to evaluate the vascular anatomy. Multidetector CT imaging of the abdomen and pelvis was performed using the standard protocol during bolus administration of intravenous contrast. CONTRAST:  148mL ISOVUE-370 IOPAMIDOL (ISOVUE-370) INJECTION 76% COMPARISON:  Chest x-ray 10/21/2017, report 07/27/2013 FINDINGS: CTA CHEST FINDINGS Cardiovascular: Satisfactory opacification of the pulmonary arteries to the segmental level. No evidence of pulmonary embolism. Mild aneurysmal dilatation of the ascending aorta, measuring up to 4.1 cm. Common origin of the right brachiocephalic and left common carotid artery. Mild  aortic atherosclerosis. Heart size within normal limits. No pericardial effusion. Mediastinum/Nodes: No enlarged mediastinal, hilar, or axillary lymph nodes. Thyroid gland, trachea, and esophagus demonstrate no significant findings. Lungs/Pleura: Bilateral mosaic attenuation. No pleural effusion or pneumothorax. Musculoskeletal: No chest wall abnormality. No acute or significant osseous findings. Review of the MIP images confirms the above findings. CT ABDOMEN and PELVIS FINDINGS Hepatobiliary: Subcentimeter hypodensities in the liver too small to further characterize. No calcified gallstones or biliary dilatation Pancreas: Unremarkable. No pancreatic ductal dilatation or surrounding inflammatory changes. Spleen: Normal in size without focal abnormality. Adrenals/Urinary Tract: Adrenal glands are within normal limits. No hydronephrosis. Nonspecific perinephric fat stranding. The bladder is within limits Stomach/Bowel: Stomach is within normal limits. Appendix appears normal. No evidence of bowel wall thickening, distention, or inflammatory changes. Vascular/Lymphatic: Mild aortic atherosclerosis. No aneurysmal dilatation. No significantly enlarged lymph nodes Reproductive: Prostate is unremarkable. Other: Negative for free air or free fluid. Skin thickening and mild edema within the anterior subcutaneous fat.  Small fat containing periumbilical hernia Musculoskeletal: Degenerative changes. No acute or suspicious lesion. Review of the MIP images confirms the above findings. IMPRESSION: 1. Negative for acute pulmonary embolus. 2. 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 Recommend annual imaging followup by CTA or MRA. This recommendation follows 2010 ACCF/AHA/AATS/ACR/ASA/SCA/SCAI/SIR/STS/SVM Guidelines for the Diagnosis and Management of Patients with Thoracic Aortic Disease. Circulation. 2010; 121: N027-O536 3. Patchy bilateral hazy parenchymal attenuation in the lungs which may be  secondary to diffuse atelectasis, small airways disease, or less likely edema 4. No CT evidence for acute intra-abdominal or pelvic abnormality. Electronically Signed   By: Donavan Foil M.D.   On: 10/21/2017 17:36   Ct Abdomen Pelvis W Contrast  Result Date: 10/21/2017 CLINICAL DATA:  Lower abdominal pain with urinary burning, short of breath 2-3 weeks EXAM: CT ANGIOGRAPHY CHEST CT ABDOMEN AND PELVIS WITH CONTRAST TECHNIQUE: Multidetector CT imaging of the chest was performed using the standard protocol during bolus administration of intravenous contrast. Multiplanar CT image reconstructions and MIPs were obtained to evaluate the vascular anatomy. Multidetector CT imaging of the abdomen and pelvis was performed using the standard protocol during bolus administration of intravenous contrast. CONTRAST:  159mL ISOVUE-370 IOPAMIDOL (ISOVUE-370) INJECTION 76% COMPARISON:  Chest x-ray 10/21/2017, report 07/27/2013 FINDINGS: CTA CHEST FINDINGS Cardiovascular: Satisfactory opacification of the pulmonary arteries to the segmental level. No evidence of pulmonary embolism. Mild aneurysmal dilatation of the ascending aorta, measuring up to 4.1 cm. Common origin of the right brachiocephalic and left common carotid artery. Mild aortic atherosclerosis. Heart size within normal limits. No pericardial effusion. Mediastinum/Nodes: No enlarged mediastinal, hilar, or axillary lymph nodes. Thyroid gland, trachea, and esophagus demonstrate no significant findings. Lungs/Pleura: Bilateral mosaic attenuation. No pleural effusion or pneumothorax. Musculoskeletal: No chest wall abnormality. No acute or significant osseous findings. Review of the MIP images confirms the above findings. CT ABDOMEN and PELVIS FINDINGS Hepatobiliary: Subcentimeter hypodensities in the liver too small to further characterize. No calcified gallstones or biliary dilatation Pancreas: Unremarkable. No pancreatic ductal dilatation or surrounding inflammatory  changes. Spleen: Normal in size without focal abnormality. Adrenals/Urinary Tract: Adrenal glands are within normal limits. No hydronephrosis. Nonspecific perinephric fat stranding. The bladder is within limits Stomach/Bowel: Stomach is within normal limits. Appendix appears normal. No evidence of bowel wall thickening, distention, or inflammatory changes. Vascular/Lymphatic: Mild aortic atherosclerosis. No aneurysmal dilatation. No significantly enlarged lymph nodes Reproductive: Prostate is unremarkable. Other: Negative for free air or free fluid. Skin thickening and mild edema within the anterior subcutaneous fat. Small fat containing periumbilical hernia Musculoskeletal: Degenerative changes. No acute or suspicious lesion. Review of the MIP images confirms the above findings. IMPRESSION: 1. Negative for acute pulmonary embolus. 2. 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 Recommend annual imaging followup by CTA or MRA. This recommendation follows 2010 ACCF/AHA/AATS/ACR/ASA/SCA/SCAI/SIR/STS/SVM Guidelines for the Diagnosis and Management of Patients with Thoracic Aortic Disease. Circulation. 2010; 121: U440-H474 3. Patchy bilateral hazy parenchymal attenuation in the lungs which may be secondary to diffuse atelectasis, small airways disease, or less likely edema 4. No CT evidence for acute intra-abdominal or pelvic abnormality. Electronically Signed   By: Donavan Foil M.D.   On: 10/21/2017 17:36    (Echo, Carotid, EGD, Colonoscopy, ERCP)    Subjective:   Discharge Exam: Vitals:   10/25/17 0426 10/25/17 0800  BP: (!) 119/50 136/89  Pulse: (!) 46 65  Resp: 18   Temp: 98.6 F (37 C)   SpO2: 96% 97%  Vitals:   10/24/17 0902 10/24/17 2045 10/25/17 0426 10/25/17 0800  BP: 128/65 125/72 (!) 119/50 136/89  Pulse: 65 (!) 57 (!) 46 65  Resp:  18 18   Temp:  97.8 F (36.6 C) 98.6 F (37 C)   TempSrc:  Oral Oral   SpO2: 95% 94% 96% 97%  Weight:   118.9 kg (262  lb 3.2 oz)   Height:        General: Pt is alert, awake, not in acute distress Cardiovascular: RRR, S1/S2 +, no rubs, no gallops Respiratory: CTA bilaterally, no wheezing, no rhonchi Abdominal: Soft, NT, ND, bowel sounds + Extremities: no edema, no cyanosis    The results of significant diagnostics from this hospitalization (including imaging, microbiology, ancillary and laboratory) are listed below for reference.     Microbiology: Recent Results (from the past 240 hour(s))  Respiratory Panel by PCR     Status: None   Collection Time: 10/22/17 12:51 AM  Result Value Ref Range Status   Adenovirus NOT DETECTED NOT DETECTED Final   Coronavirus 229E NOT DETECTED NOT DETECTED Final   Coronavirus HKU1 NOT DETECTED NOT DETECTED Final   Coronavirus NL63 NOT DETECTED NOT DETECTED Final   Coronavirus OC43 NOT DETECTED NOT DETECTED Final   Metapneumovirus NOT DETECTED NOT DETECTED Final   Rhinovirus / Enterovirus NOT DETECTED NOT DETECTED Final   Influenza A NOT DETECTED NOT DETECTED Final   Influenza B NOT DETECTED NOT DETECTED Final   Parainfluenza Virus 1 NOT DETECTED NOT DETECTED Final   Parainfluenza Virus 2 NOT DETECTED NOT DETECTED Final   Parainfluenza Virus 3 NOT DETECTED NOT DETECTED Final   Parainfluenza Virus 4 NOT DETECTED NOT DETECTED Final   Respiratory Syncytial Virus NOT DETECTED NOT DETECTED Final   Bordetella pertussis NOT DETECTED NOT DETECTED Final   Chlamydophila pneumoniae NOT DETECTED NOT DETECTED Final   Mycoplasma pneumoniae NOT DETECTED NOT DETECTED Final     Labs: BNP (last 3 results) Recent Labs    10/21/17 1426  BNP 025.8*   Basic Metabolic Panel: Recent Labs  Lab 10/21/17 1426 10/22/17 0316 10/23/17 0556 10/25/17 0438  NA 136 137 136 140  K 4.8 4.8 4.0 3.7  CL 102 99* 97* 101  CO2 27 27 30 30   GLUCOSE 189* 246* 137* 83  BUN 20 16 22* 25*  CREATININE 0.81 0.99 0.83 1.00  CALCIUM 8.7* 8.9 8.9 8.6*   Liver Function Tests: Recent Labs   Lab 10/21/17 1426 10/22/17 0316  AST 45* 36  ALT 83* 76*  ALKPHOS 74 73  BILITOT 0.5 0.8  PROT 6.9 6.2*  ALBUMIN 3.7 3.4*   No results for input(s): LIPASE, AMYLASE in the last 168 hours. No results for input(s): AMMONIA in the last 168 hours. CBC: Recent Labs  Lab 10/21/17 1426 10/22/17 0316 10/23/17 0556 10/25/17 0438  WBC 12.2* 12.0* 15.8* 12.1*  NEUTROABS 10.2* 11.0* 13.6* 8.2*  HGB 11.9* 11.9* 12.3* 12.8*  HCT 36.8* 37.7* 39.0 40.5  MCV 95.1 95.4 96.3 95.1  PLT 314 302 328 312   Cardiac Enzymes: Recent Labs  Lab 10/21/17 1426  TROPONINI <0.03   BNP: Invalid input(s): POCBNP CBG: Recent Labs  Lab 10/24/17 0745 10/24/17 1126 10/24/17 1641 10/24/17 2125 10/25/17 0724  GLUCAP 131* 123* 123* 123* 79   D-Dimer No results for input(s): DDIMER in the last 72 hours. Hgb A1c No results for input(s): HGBA1C in the last 72 hours. Lipid Profile No results for input(s): CHOL, HDL, LDLCALC, TRIG, CHOLHDL, LDLDIRECT  in the last 72 hours. Thyroid function studies No results for input(s): TSH, T4TOTAL, T3FREE, THYROIDAB in the last 72 hours.  Invalid input(s): FREET3 Anemia work up No results for input(s): VITAMINB12, FOLATE, FERRITIN, TIBC, IRON, RETICCTPCT in the last 72 hours. Urinalysis    Component Value Date/Time   COLORURINE YELLOW 10/01/2011 [place] 10/01/2011 1223   LABSPEC 1.020 10/01/2011 1223   PHURINE 5.0 10/01/2011 1223   GLUCOSEU NEGATIVE 10/01/2011 1223   HGBUR NEGATIVE 10/01/2011 1223   BILIRUBINUR NEGATIVE 10/01/2011 1223   KETONESUR NEGATIVE 10/01/2011 1223   PROTEINUR NEGATIVE 10/01/2011 1223   UROBILINOGEN 0.2 10/01/2011 1223   NITRITE NEGATIVE 10/01/2011 1223   LEUKOCYTESUR NEGATIVE 10/01/2011 1223   Sepsis Labs Invalid input(s): PROCALCITONIN,  WBC,  LACTICIDVEN Microbiology Recent Results (from the past 240 hour(s))  Respiratory Panel by PCR     Status: None   Collection Time: 10/22/17 12:51 AM  Result Value  Ref Range Status   Adenovirus NOT DETECTED NOT DETECTED Final   Coronavirus 229E NOT DETECTED NOT DETECTED Final   Coronavirus HKU1 NOT DETECTED NOT DETECTED Final   Coronavirus NL63 NOT DETECTED NOT DETECTED Final   Coronavirus OC43 NOT DETECTED NOT DETECTED Final   Metapneumovirus NOT DETECTED NOT DETECTED Final   Rhinovirus / Enterovirus NOT DETECTED NOT DETECTED Final   Influenza A NOT DETECTED NOT DETECTED Final   Influenza B NOT DETECTED NOT DETECTED Final   Parainfluenza Virus 1 NOT DETECTED NOT DETECTED Final   Parainfluenza Virus 2 NOT DETECTED NOT DETECTED Final   Parainfluenza Virus 3 NOT DETECTED NOT DETECTED Final   Parainfluenza Virus 4 NOT DETECTED NOT DETECTED Final   Respiratory Syncytial Virus NOT DETECTED NOT DETECTED Final   Bordetella pertussis NOT DETECTED NOT DETECTED Final   Chlamydophila pneumoniae NOT DETECTED NOT DETECTED Final   Mycoplasma pneumoniae NOT DETECTED NOT DETECTED Final     Time coordinating discharge: Over 30 minutes  SIGNED:   Georgette Shell, MD  [facility] 10/25/2017, 8:58 AM  If 7PM-7AM, please contact night-coverage [email or web address] Password [email or web address]

## 2017-10-25 NOTE — Telephone Encounter (Signed)
Schedule appt for 11/01/17 at 12:20pm and pt has been notified.

## 2017-10-25 NOTE — Telephone Encounter (Signed)
See 10/25/17 phone note.

## 2017-10-25 NOTE — Telephone Encounter (Signed)
Please contact pt to arrange a 1 week hospital follow up.

## 2017-10-25 NOTE — Progress Notes (Signed)
Reviewed discharge instructions with pt and pt's wife. Answered their questions. Pt is stable and ready for discharge.

## 2017-10-25 NOTE — Progress Notes (Signed)
Pt has ambulated briskly the length of the hallway and back twice during this shift on room air without SOB. Pt tolerated very well.

## 2017-10-26 ENCOUNTER — Ambulatory Visit: Payer: BLUE CROSS/BLUE SHIELD | Admitting: Family

## 2017-11-01 ENCOUNTER — Encounter: Payer: Self-pay | Admitting: Family

## 2017-11-01 ENCOUNTER — Ambulatory Visit: Payer: BLUE CROSS/BLUE SHIELD | Admitting: Family

## 2017-11-01 VITALS — BP 119/65 | HR 59 | Temp 98.1°F | Resp 16 | Ht 67.0 in | Wt 255.0 lb

## 2017-11-01 DIAGNOSIS — G4733 Obstructive sleep apnea (adult) (pediatric): Secondary | ICD-10-CM

## 2017-11-01 DIAGNOSIS — I5032 Chronic diastolic (congestive) heart failure: Secondary | ICD-10-CM

## 2017-11-01 DIAGNOSIS — R739 Hyperglycemia, unspecified: Secondary | ICD-10-CM | POA: Diagnosis not present

## 2017-11-01 DIAGNOSIS — J9801 Acute bronchospasm: Secondary | ICD-10-CM | POA: Diagnosis not present

## 2017-11-01 MED ORDER — FUROSEMIDE 20 MG PO TABS: 20.0000 mg | ORAL_TABLET | Freq: Every day | ORAL | 1 refills | Status: DC

## 2017-11-01 NOTE — Progress Notes (Signed)
Subjective:    [name], male    DOB: 07/15/1956, 62 y.o.   MRN: [id number]  HPI  Patient is a 62 yr old male who presents today for hospital follow up.  Hospital  discharge summary is reviewed.  He was admitted from March 1 - October 25, 2017. Upon arrival to the emergency department he had mild tachypnea and shortness of breath.  There was a question of bilateral mild interstitial prominence on chest x-ray.  He had a CT angios the chest which was negative for PE but noted patchy bilateral haziness suggestive of small airway disease or less likely edema.  He was treated with albuterol nebs and IV Solu-Medrol.  He was admitted for further evaluation and treatment.  He was ultimately discharged on room air and a prednisone taper.  BNP was mildly elevated during his admission.  He improved with 1 dose of Lasix in the emergency room.  He was discharged on Lasix 20 mg once daily.  This was in place of his hydrochlorothiazide.  2D echo performed during his admission noted normal left ventricular ejection fraction of 60-65%.  No wall motion abnormalities were noted.  He was noted to have mild pulmonary hypertension.  Wt Readings from Last 3 Encounters:  11/01/17 255 lb (115.7 kg)  10/25/17 262 lb 3.2 oz (118.9 kg)  10/19/17 268 lb (121.6 kg)   He reports improvement in his breathing since he got home. Still has SOB when he climbs the stairs.  He has a cpap machine but does not use it. Reports last sleep study >5 yrs ago.     Review of Systems See HPI  Past Medical History:  Diagnosis Date  . Anemia 11/16/2014  . Bladder disease    "an unusual bladder disease; don't know what it's called" (06/30/2015)  . Bulging lumbar disc   . GERD (gastroesophageal reflux disease)   . History of hiatal hernia   . Hyperglycemia 11/16/2014  . Hyperlipemia   . Hypertension   . Joint pain, knee   . Migraine    06/30/2015 "maybe 2-3 times/year; not as bad as I used to have them"  . OSA  (obstructive sleep apnea)    "dr took me off the mask; told me to lose weight" (06/30/2015)     Social History   Socioeconomic History  . Marital status: Married    Spouse name: Not on file  . Number of children: 3  . Years of education: Not on file  . Highest education level: Not on file  Social Needs  . Financial resource strain: Not on file  . Food insecurity - worry: Not on file  . Food insecurity - inability: Not on file  . Transportation needs - medical: Not on file  . Transportation needs - non-medical: Not on file  Occupational History  . Not on file  Tobacco Use  . Smoking status: Former Smoker    Types: Cigarettes  . Smokeless tobacco: Never Used  . Tobacco comment: "stopped smoking in ~ 1990"  Substance and Sexual Activity  . Alcohol use: No    Alcohol/week: 0.0 oz  . Drug use: No  . Sexual activity: Yes  Other Topics Concern  . 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  ESOPHAGOGASTRODUODENOSCOPY    . ESOPHAGOGASTRODUODENOSCOPY (EGD) WITH ESOPHAGEAL DILATION  ~ 2014  . KNEE ARTHROSCOPY Right 1995  . NASAL SEPTUM SURGERY  ~ 1972  . TOTAL KNEE ARTHROPLASTY  10/11/2011   Procedure: TOTAL KNEE ARTHROPLASTY;  Surgeon: Rudean Haskell, MD;  Location: [facility];  Service: Orthopedics;  Laterality: Right;  . 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 Stroke Mother 46       Died of CVA  . Hypertension Mother   . Anesthesia problems Neg Hx   . Hypotension Neg Hx   . Malignant hyperthermia Neg Hx   . Pseudochol deficiency Neg Hx     No Known Allergies  Current Outpatient Medications on File Prior to Visit  Medication Sig Dispense Refill  . albuterol (PROVENTIL HFA;VENTOLIN HFA) 108 (90 Base) MCG/ACT inhaler  Inhale 2 puffs into the lungs every 6 (six) hours as needed for wheezing or shortness of breath. 1 Inhaler 2  . albuterol (PROVENTIL) (2.5 MG/3ML) 0.083% nebulizer solution Take 3 mLs (2.5 mg total) by nebulization every 6 (six) hours as needed for wheezing or shortness of breath. 150 mL 1  . ALPRAZolam (XANAX) 0.5 MG tablet Take 0.5 tablets (0.25 mg total) by mouth 2 (two) times daily as needed for anxiety. 30 tablet 0  . amLODipine-benazepril (LOTREL) 10-40 MG capsule Take 1 capsule by mouth daily. 90 capsule 3  . ARIPiprazole (ABILIFY) 15 MG tablet Take 0.5 tablets (7.5 mg total) by mouth daily. 30 tablet 0  . b complex vitamins tablet Take 1 tablet by mouth daily.    . benzonatate (TESSALON) 100 MG capsule Take 1 capsule (100 mg total) by mouth 3 (three) times daily as needed for cough. 30 capsule 0  . ferrous sulfate 325 (65 FE) MG tablet Take 1 tablet (325 mg total) by mouth 2 (two) times daily with a meal. 60 tablet 0  . fexofenadine (ALLEGRA) 180 MG tablet Take 1 tablet (180 mg total) by mouth daily. 30 tablet 0  . furosemide (LASIX) 20 MG tablet Take 1 tablet (20 mg total) by mouth daily. 30 tablet 0  . metoprolol succinate (TOPROL XL) 100 MG 24 hr tablet Take 1 tablet (100 mg total) by mouth daily. Take with or immediately following a meal. 30 tablet 2  . Multiple Vitamin (MULTIVITAMIN WITH MINERALS) TABS tablet Take 1 tablet by mouth daily.    . Nebulizers (VIOS AEROSOL DELIVERY SYSTEM) MISC 1 each as directed.  0  . omeprazole (PRILOSEC) 40 MG capsule Take 1 capsule (40 mg total) by mouth daily. 90 capsule 0  . potassium chloride (K-DUR,KLOR-CON) 10 MEQ tablet Take 1 tablet (10 mEq total) by mouth daily. 30 tablet 0  . predniSONE (DELTASONE) 10 MG tablet Take 1 tablet (10 mg total) by mouth daily. 10 tablet 0  . sertraline (ZOLOFT) 100 MG tablet Take 1.5 tablets (150 mg total) by mouth daily. 30 tablet 0  . sildenafil (VIAGRA) 50 MG tablet Take 1 tablet (50 mg total) by mouth daily as  needed for erectile dysfunction. 10 tablet 0   No current facility-administered medications on file prior to visit.     BP 119/65 (BP Location: Right Arm, Patient Position: Sitting, Cuff Size: Large)   Pulse (!) 59   Temp 98.1 F (36.7 C) (Oral)   Resp 16   Ht 5\' 7"  (1.702 m)   Wt 255 lb (115.7 kg)   SpO2  94%   BMI 39.94 kg/m       Objective:   Physical Exam  Constitutional: He is oriented to person, place, and time. He appears well-developed and well-nourished. No distress.  HENT:  Head: Normocephalic and atraumatic.  Cardiovascular: Normal rate and regular rhythm.  No murmur heard. Pulmonary/Chest: Effort normal and breath sounds normal. No respiratory distress. He has no wheezes. He has no rales.  Musculoskeletal: He exhibits no edema.  Neurological: He is alert and oriented to person, place, and time.  Skin: Skin is warm and dry.  Psychiatric: He has a normal mood and affect. His behavior is normal. Thought content normal.          Assessment & Plan:  OSA-pulmonary hypertension noted on echo. Stressed importance of weight loss and of cpap compliance.  Since it is been so long since his last sleep study I will set him up for a split-night study.  Chronic diastolic heart failure-appears euvolemic today.  Continue current dose of Lasix.  Will obtain follow-up electrolytes and kidney function today.  Acute bronchospasm-resolved. Monitor.  Hyperglycemia- was higher in setting of steroid use. Monitor.   Lab Results  Component Value Date   HGBA1C 5.8 08/22/2017

## 2017-11-01 NOTE — Patient Instructions (Signed)
Please complete lab work prior to leaving.   

## 2017-11-02 LAB — BASIC METABOLIC PANEL
BUN: 12 mg/dL (ref 6–23)
CHLORIDE: 96 meq/L (ref 96–112)
CO2: 33 meq/L — AB (ref 19–32)
CREATININE: 1.15 mg/dL (ref 0.40–1.50)
Calcium: 9.3 mg/dL (ref 8.4–10.5)
GFR: 68.28 mL/min (ref >60.00)
Glucose, Bld: 88 mg/dL (ref 70–99)
Potassium: 4.1 mEq/L (ref 3.5–5.1)
SODIUM: 137 meq/L (ref 135–145)

## 2017-11-04 ENCOUNTER — Other Ambulatory Visit: Payer: Self-pay | Admitting: Family

## 2017-11-04 MED ORDER — METOPROLOL SUCCINATE ER 100 MG PO TB24: 100.0000 mg | ORAL_TABLET | Freq: Every day | ORAL | 1 refills | Status: DC

## 2017-11-04 NOTE — Telephone Encounter (Signed)
Pt requesting a 90 day supply of Metoprolol. Not previously filled by [name].   LOV: 11/01/17  Melissa O'Sullivan,NP  [facility]

## 2017-11-04 NOTE — Telephone Encounter (Signed)
Copied from [place] [id number]. Topic: Quick Communication - Rx Refill/Question >> Nov 04, 2017  9:17 AM Jeremy [name] wrote: Medication:  metoprolol succinate (TOPROL XL) 100 MG 24 hr tablet   90 day supply  Has the patient contacted their pharmacy? yes   (Agent: If no, request that the patient contact the pharmacy for the refill.)   Preferred Pharmacy (with phone number or street name): [facility] - [name], [place] [phone] (Phone) [phone] (Fax)    Agent: Please be advised that RX refills may take up to 3 business days. We ask that you follow-up with your pharmacy.

## 2017-11-07 NOTE — Progress Notes (Signed)
Cardiology Office Note   Date:  11/08/2017   ID:  Jeremy [name], DOB [birth year], MRN [id number]  PCP:  [name], NP  Cardiologist:  Dr. Percival Spanish Chief Complaint  Patient presents with  . Hospitalization Follow-up  . Chest Pain     History of Present Illness: Jeremy [name] is a 62 y.o. male who presents for posthospitalization follow-up. The patient was normally followed in our office for evaluation of chest pain. The patient had this intermittently, the patient is able to walk on a treadmill routinely without bringing chest discomfort on. Patient was last seen in our office on 07/25/2015.  The patient was admitted to the hospital on March 10/25/2017 with tachypnea and dyspnea. He ruled out for PE but was noted to have haziness suggestive of small airway disease or less likely edema. Repeat echocardiogram was performed revealing normal LV systolic function of 37% to 65%. It did improve with one IV dose of Lasix in the emergency room and he was discharged on 20 mg daily, HCTZ was discontinued. He was noted to have mild pulmonary hypertension. Weight on discharge 362 lbs, follow-up weight in primary care provider office 255 lbs.  He comes today without any further complaints of dyspnea, but does continue to have discomfort in his chest. He admits to eating fast food restaurants a lot, high salt foods. His wife is a vegetarian has not been able to convince him to eat more healthily at home.  The patient is been trying to work out by walking on a treadmill at a gym. He begins to have chest pressure and shortness of breath radiating to his shoulders while walking on the treadmill. When he stops to rest the discomfort goes away. He is also been complaining of worsening fatigue. He is medically compliant and doing his best to avoid salt.   Past Medical History:  Diagnosis Date  . Anemia 11/16/2014  . Bladder disease    "an unusual bladder disease; don't know what it's called"  (06/30/2015)  . Bulging lumbar disc   . GERD (gastroesophageal reflux disease)   . History of hiatal hernia   . Hyperglycemia 11/16/2014  . Hyperlipemia   . Hypertension   . Joint pain, knee   . Migraine    06/30/2015 "maybe 2-3 times/year; not as bad as I used to have them"  . OSA (obstructive sleep apnea)    "dr took me off the mask; told me to lose weight" (06/30/2015)    Past Surgical History:  Procedure Laterality Date  . ESOPHAGOGASTRODUODENOSCOPY    . ESOPHAGOGASTRODUODENOSCOPY (EGD) WITH ESOPHAGEAL DILATION  ~ 2014  . KNEE ARTHROSCOPY Right 1995  . NASAL SEPTUM SURGERY  ~ 1972  . TOTAL KNEE ARTHROPLASTY  10/11/2011   Procedure: TOTAL KNEE ARTHROPLASTY;  Surgeon: Rudean Haskell, MD;  Location: [facility];  Service: Orthopedics;  Laterality: Right;  . 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)  ~2014     Current Outpatient Medications  Medication Sig Dispense Refill  . albuterol (PROVENTIL HFA;VENTOLIN HFA) 108 (90 Base) MCG/ACT inhaler Inhale 2 puffs into the lungs every 6 (six) hours as needed for wheezing or shortness of breath. 1 Inhaler 2  . albuterol (PROVENTIL) (2.5 MG/3ML) 0.083% nebulizer solution Take 3 mLs (2.5 mg total) by nebulization every 6 (six) hours as needed for wheezing or shortness of breath. 150 mL 1  . ALPRAZolam (XANAX) 0.5 MG tablet Take 0.5 tablets (0.25 mg total) by mouth 2 (two) times daily as needed for  anxiety. 30 tablet 0  . amLODipine-benazepril (LOTREL) 10-40 MG capsule Take 1 capsule by mouth daily. 90 capsule 3  . ARIPiprazole (ABILIFY) 15 MG tablet Take 0.5 tablets (7.5 mg total) by mouth daily. 30 tablet 0  . b complex vitamins tablet Take 1 tablet by mouth daily.    . benzonatate (TESSALON) 100 MG capsule Take 1 capsule (100 mg total) by mouth 3 (three) times daily as needed for cough. 30 capsule 0  . ferrous sulfate 325 (65 FE) MG tablet Take 1 tablet (325 mg total) by mouth 2 (two) times daily with a meal. 60 tablet 0  .  fexofenadine (ALLEGRA) 180 MG tablet Take 1 tablet (180 mg total) by mouth daily. 30 tablet 0  . furosemide (LASIX) 20 MG tablet Take 1 tablet (20 mg total) by mouth daily. 90 tablet 1  . metoprolol succinate (TOPROL XL) 100 MG 24 hr tablet Take 1 tablet (100 mg total) by mouth daily. Take with or immediately following a meal. 90 tablet 1  . Multiple Vitamin (MULTIVITAMIN WITH MINERALS) TABS tablet Take 1 tablet by mouth daily.    . Nebulizers (VIOS AEROSOL DELIVERY SYSTEM) MISC 1 each as directed.  0  . omeprazole (PRILOSEC) 40 MG capsule Take 1 capsule (40 mg total) by mouth daily. 90 capsule 0  . potassium chloride (K-DUR,KLOR-CON) 10 MEQ tablet Take 1 tablet (10 mEq total) by mouth daily. 30 tablet 0  . predniSONE (DELTASONE) 10 MG tablet Take 1 tablet (10 mg total) by mouth daily. 10 tablet 0  . sertraline (ZOLOFT) 100 MG tablet Take 1.5 tablets (150 mg total) by mouth daily. 30 tablet 0  . sildenafil (VIAGRA) 50 MG tablet Take 1 tablet (50 mg total) by mouth daily as needed for erectile dysfunction. 10 tablet 0  . nitroGLYCERIN (NITROSTAT) 0.4 MG SL tablet Place 1 tablet (0.4 mg total) under the tongue every 5 (five) minutes as needed for chest pain. 25 tablet 3   No current facility-administered medications for this visit.     Allergies:   Patient has no known allergies.    Social History:  The patient  reports that he has quit smoking. His smoking use included cigarettes. he has never used smokeless tobacco. He reports that he does not drink alcohol or use drugs.   Family History:  The patient's family history includes Hypertension in his mother; Stroke (age of onset: 31) in his mother.    ROS: All other systems are reviewed and negative. Unless otherwise mentioned in H&P    PHYSICAL EXAM: VS:  BP 116/78   Pulse 78   Ht 5\' 7"  (1.702 m)   Wt 257 lb 9.6 oz (116.8 kg)   BMI 40.35 kg/m  , BMI Body mass index is 40.35 kg/m. Obese GEN: Well nourished, well developed, in no acute  distress  HEENT: normal  Neck: no JVD, carotid bruits, or masses Cardiac: RRR; systolic murmur murmur, rubs, or gallops,no edema  Respiratory:  Clear to auscultation bilaterally, normal work of breathing GI: soft, nontender, nondistended, + BS MS: no deformity or atrophy  Skin: warm and dry, no rash Neuro:  Strength and sensation are intact Psych: euthymic mood, full affect   EKG:  Sinus rhythm, rate of 70 bpm, no acute ST-T wave changes.  Recent Labs: 10/21/2017: B Natriuretic Peptide 122.5 10/22/2017: ALT 76 10/25/2017: Hemoglobin 12.8; Platelets 312 11/01/2017: BUN 12; Creatinine, Ser 1.15; Potassium 4.1; Sodium 137    Lipid Panel    Component Value Date/Time  CHOL 200 06/30/2015 1604   TRIG 91 06/30/2015 1604   HDL 43 06/30/2015 1604   CHOLHDL 4.7 06/30/2015 1604   VLDL 18 06/30/2015 1604   LDLCALC 139 (H) 06/30/2015 1604      Wt Readings from Last 3 Encounters:  11/08/17 257 lb 9.6 oz (116.8 kg)  11/01/17 255 lb (115.7 kg)  10/25/17 262 lb 3.2 oz (118.9 kg)      Other studies Reviewed: Echocardiogram 11-15-2017 Left ventricle: The cavity size was normal. There was moderate   concentric hypertrophy. Systolic function was normal. The   estimated ejection fraction was in the range of 60% to 65%. Wall   motion was normal; there were no regional wall motion   abnormalities. The deceleration time of the early transmitral   flow velocity was normal. Left ventricular diastolic function   parameters were normal. - Aortic valve: There was trivial regurgitation. - Aorta: Ascending aorta diameter: 43 mm (ED). - Ascending aorta: The ascending aorta was mildly dilated. - Mitral valve: Calcified annulus. Mild focal calcification of the   anterior leaflet. - Right ventricle: The cavity size was moderately dilated. Wall   thickness was normal. - Pulmonary arteries: PA peak pressure: 38 mm Hg (S).  Impressions:  - The right ventricular systolic pressure was increased  consistent   with mild pulmonary hypertension.  ASSESSMENT AND PLAN:  1. Recurrent chest pain: The patient had a GXT completed which was negative for acute ST-T wave changes in December 2016. He is again complaining of chest pressure substernal with exercise, some burning, with radiation while walking on treadmill. Cardiac stress test with addition of nuclear medicine imaging. This will allow [place] to see what his blood pressures doing well exercising along with perfusion deficiencies if noted.  2. Hypertension: Well-controlled on amlodipine/benazepril 10/40, metoprolol 100 mg daily, and furosemide when necessary. He will hold metoprolol prior distress.  3. OSA: He continues to use CPAP as directed.  4. Hypercholesterolemia: Do not see that he is currently on statin therapy. We'll need to have follow-up labs to evaluate further. This will be completed today that he has stress test and he will be nothing by mouth. Was recent labs that are recorded were on 06/30/2015: Total cholesterol 200, HDL 43, LDL 139, triglycerides 91.   Current medicines are reviewed at length with the patient today.    Labs/ tests ordered today include:NM Stress test, fasting lipids and LFTs.  Phill Myron. [place] Pugh, ANP, AACC   11/08/2017 12:33 PM    [facility] [address] [place], [place] [address] Phone: [phone]; Fax: [phone]

## 2017-11-08 ENCOUNTER — Encounter: Payer: Self-pay | Admitting: Adult Health

## 2017-11-08 ENCOUNTER — Telehealth: Payer: Self-pay | Admitting: Cardiology

## 2017-11-08 ENCOUNTER — Ambulatory Visit: Payer: BLUE CROSS/BLUE SHIELD | Admitting: Adult Health

## 2017-11-08 VITALS — BP 116/78 | HR 78 | Ht 67.0 in | Wt 257.6 lb

## 2017-11-08 DIAGNOSIS — R0789 Other chest pain: Secondary | ICD-10-CM

## 2017-11-08 DIAGNOSIS — I10 Essential (primary) hypertension: Secondary | ICD-10-CM

## 2017-11-08 DIAGNOSIS — E7800 Pure hypercholesterolemia, unspecified: Secondary | ICD-10-CM

## 2017-11-08 MED ORDER — NITROGLYCERIN 0.4 MG SL SUBL: 0.4000 mg | SUBLINGUAL_TABLET | SUBLINGUAL | 3 refills | Status: DC | PRN

## 2017-11-08 NOTE — Patient Instructions (Addendum)
Medication Instructions:  TAKE NITRO-UNDER THE TONGUE CAN TAKE EVERY 5 MIN AS NEEDED X3  If you need a refill on your cardiac medications before your next appointment, please call your pharmacy.  Testing/Procedures: Your physician has requested that you have en exercise stress myoview. A cardiac stress test is a cardiological test that measures the heart's ability to respond to external stress in a controlled clinical environment. The stress response is induced by exercise or by intravenous pharmacological stimulation.  For further information please visit [facility]uge[facility]iest[facility]tn. Please follow instruction sheet, as given. DO NOT TAKE METOPROLOL THE MORNING OF THE STRESS TEST YOU CAN TAKE IT AFTER TEST Follow-Up: Your physician wants you to follow-up in: AFTER STRESS TESTING   Thank you for choosing [facility]!!

## 2017-11-08 NOTE — Telephone Encounter (Signed)
Jeremy Matter(  [employer]) is calling because  Mr. Ballen was prescribe Nitrostat tablets and he also takes Viagra and there is a major interaction between these medications . Please call

## 2017-11-08 NOTE — Telephone Encounter (Signed)
Called [employer]. Explained that patient was Rx'ed NTG PRN today under direction of cardiology provider. They will counsel patient on use of PRN NTG and viagra.

## 2017-11-09 ENCOUNTER — Telehealth (HOSPITAL_COMMUNITY): Payer: Self-pay

## 2017-11-09 NOTE — Telephone Encounter (Signed)
Encounter complete. 

## 2017-11-10 ENCOUNTER — Telehealth (HOSPITAL_COMMUNITY): Payer: Self-pay

## 2017-11-10 ENCOUNTER — Ambulatory Visit (HOSPITAL_COMMUNITY)
Admission: RE | Admit: 2017-11-10 | Discharge: 2017-11-10 | Disposition: A | Discharge status: Home / Self Care | Payer: BLUE CROSS/BLUE SHIELD | Source: Ambulatory Visit | Attending: Cardiovascular Disease | Admitting: Cardiovascular Disease

## 2017-11-10 DIAGNOSIS — E669 Obesity, unspecified: Secondary | ICD-10-CM | POA: Diagnosis not present

## 2017-11-10 DIAGNOSIS — I2510 Atherosclerotic heart disease of native coronary artery without angina pectoris: Secondary | ICD-10-CM | POA: Insufficient documentation

## 2017-11-10 DIAGNOSIS — R5383 Other fatigue: Secondary | ICD-10-CM | POA: Diagnosis not present

## 2017-11-10 DIAGNOSIS — G4733 Obstructive sleep apnea (adult) (pediatric): Secondary | ICD-10-CM | POA: Insufficient documentation

## 2017-11-10 DIAGNOSIS — I110 Hypertensive heart disease with heart failure: Secondary | ICD-10-CM | POA: Insufficient documentation

## 2017-11-10 DIAGNOSIS — R0789 Other chest pain: Secondary | ICD-10-CM

## 2017-11-10 DIAGNOSIS — K219 Gastro-esophageal reflux disease without esophagitis: Secondary | ICD-10-CM | POA: Insufficient documentation

## 2017-11-10 DIAGNOSIS — I509 Heart failure, unspecified: Secondary | ICD-10-CM | POA: Diagnosis not present

## 2017-11-10 DIAGNOSIS — R0602 Shortness of breath: Secondary | ICD-10-CM | POA: Insufficient documentation

## 2017-11-10 DIAGNOSIS — Z87891 Personal history of nicotine dependence: Secondary | ICD-10-CM | POA: Diagnosis not present

## 2017-11-10 LAB — MYOCARDIAL PERFUSION IMAGING
CHL CUP MPHR: 158 {beats}/min
CHL CUP NUCLEAR SSS: 1
CSEPEDS: 0 s
CSEPEW: 10.1 METS
Exercise duration (min): 9 min
LV dias vol: 101 mL (ref 62–150)
LVSYSVOL: 39 mL
Peak HR: 151 {beats}/min
Percent HR: 95 %
RPE: 18
Rest HR: 72 {beats}/min
SDS: 1
SRS: 0
TID: 1.22

## 2017-11-10 MED ORDER — TECHNETIUM TC 99M TETROFOSMIN IV KIT
10.8000 | PACK | Freq: Once | INTRAVENOUS | Status: AC | PRN
  Administered 2017-11-10: 10.8 via INTRAVENOUS
  Filled 2017-11-10: qty 11

## 2017-11-10 MED ORDER — TECHNETIUM TC 99M TETROFOSMIN IV KIT
31.5000 | PACK | Freq: Once | INTRAVENOUS | Status: AC | PRN
  Administered 2017-11-10: 31.5 via INTRAVENOUS
  Filled 2017-11-10: qty 32

## 2017-11-11 ENCOUNTER — Telehealth: Payer: Self-pay | Admitting: Family

## 2017-11-11 NOTE — Telephone Encounter (Signed)
Copied from [place] [id number]. Topic: General - Other >> Nov 11, 2017  8:07 AM Margot Ables wrote: Reason for CRM: pt asking to have a work note extended to write him out until at least 11/16/17 when he goes back to Dr. Purcell Nails. He had testing 11/10/17 and goes in for results 11/16/17. Please advise

## 2017-11-11 NOTE — Telephone Encounter (Signed)
Ok to extend work note please.

## 2017-11-11 NOTE — Telephone Encounter (Signed)
Letter completed and sent to pt via mychart. Attempted to notify pt and left detailed message that he could print letter from [facility] at home if he is able; otherwise call me back Monday if he needs further assistance.

## 2017-11-14 ENCOUNTER — Telehealth: Payer: Self-pay | Admitting: *Deleted

## 2017-11-14 ENCOUNTER — Encounter: Payer: Self-pay | Admitting: *Deleted

## 2017-11-14 NOTE — Telephone Encounter (Signed)
[facility]oke with [facility] and was advised that they should be reaching out to pt by the end of this week. They are waiting on insurance determination. Left detailed voicemail on home # and left # to the [facility] for pt to call if he doesn't hear from them as above. Also advised him that no CXR is advised at this time and to call or send mychart message if he has any additional questions / concerns.

## 2017-11-14 NOTE — Telephone Encounter (Signed)
Pt walked in to pick up letter as he is unable to print it from home. Letter printed and given to front office.

## 2017-11-14 NOTE — Telephone Encounter (Signed)
Pt walked in to office stating he has not been contacted about his split night sleep study yet and he is wanting to know when he should have a repeat chest xray to follow up on the findings from the hospital. He states he talked about this at last visit with PCP. Advised pt I will check status of sleep study. Please advise regarding CXR?

## 2017-11-14 NOTE — Telephone Encounter (Signed)
I don't think he needs a repeat chest x-ray. I placed order for split night study on 11/01/17. Could you please call the [facility] sleep center and check status of scheduling?

## 2017-11-15 ENCOUNTER — Telehealth: Payer: Self-pay | Admitting: *Deleted

## 2017-11-15 DIAGNOSIS — F410 Panic disorder [episodic paroxysmal anxiety] without agoraphobia: Secondary | ICD-10-CM | POA: Diagnosis not present

## 2017-11-15 NOTE — Telephone Encounter (Signed)
Spoke with patient and discussed the interaction with NTG and Viagra, advised not to use either within 24 hours of each other. Per patient he has not had any chest pains and would probably not get NTG anyway. Advised ok to have on hand. Did call mail order and leave message patient did not want mailed was going to get locally.

## 2017-11-15 NOTE — Telephone Encounter (Signed)
[name] left a msg on the refill vm stating that they received an rx for nitro from [facility]. Patient recently refilled viagra from another provider and they will not ship the nitro to the patient until they are sure that the patient has been counseled on these medications being taken together. Call back number provided was [phone] and reference number [id number]. Thanks, MI

## 2017-11-15 NOTE — Progress Notes (Signed)
Cardiology Office Note   Date:  11/16/2017   ID:  Jeremy [name], DOB [birth year], MRN [id number]  PCP:  [name], NP  Cardiologist:  [facility]  Chief Complaint  Patient presents with  . Chest Pain  . Shortness of Breath     History of Present Illness: Jeremy [name] is a 62 y.o. male who presents for ongoing assessment and management of chest pain, prior dyspnea on exertion.  During workouts in the gym walking on treadmill began to have chest discomfort with shortness of breath radiating to shoulders.  The patient was scheduled for stress test with nuclear imaging.  Study Highlights 11/10/2017    The left ventricular ejection fraction is normal (55-65%).  Nuclear stress EF: 61%.  Blood pressure demonstrated a normal response to exercise.  There was no ST segment deviation noted during stress.  The study is normal.  This is a low risk study.   Normal resting and stress perfusion. No ischemia or infarction EF 61%   Today he is without complaints.  He is scheduled for a sleep study on November 24, 2017 to evaluate for OSA.  He states he had been diagnosed with OSA in the past and had been wearing CPAP, not doing so is the mask was ill fitting.  Continues to have some mild dyspnea but no further chest pain.  He is anxious to return to work.  Past Medical History:  Diagnosis Date  . Anemia 11/16/2014  . Bladder disease    "an unusual bladder disease; don't know what it's called" (06/30/2015)  . Bulging lumbar disc   . GERD (gastroesophageal reflux disease)   . History of hiatal hernia   . Hyperglycemia 11/16/2014  . Hyperlipemia   . Hypertension   . Joint pain, knee   . Migraine    06/30/2015 "maybe 2-3 times/year; not as bad as I used to have them"  . OSA (obstructive sleep apnea)    "dr took me off the mask; told me to lose weight" (06/30/2015)    Past Surgical History:  Procedure Laterality Date  . ESOPHAGOGASTRODUODENOSCOPY    . ESOPHAGOGASTRODUODENOSCOPY  (EGD) WITH ESOPHAGEAL DILATION  ~ 2014  . KNEE ARTHROSCOPY Right 1995  . NASAL SEPTUM SURGERY  ~ 1972  . TOTAL KNEE ARTHROPLASTY  10/11/2011   Procedure: TOTAL KNEE ARTHROPLASTY;  Surgeon: Rudean Haskell, MD;  Location: [place];  Service: Orthopedics;  Laterality: Right;  . 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)  ~2014     Current Outpatient Medications  Medication Sig Dispense Refill  . albuterol (PROVENTIL HFA;VENTOLIN HFA) 108 (90 Base) MCG/ACT inhaler Inhale 2 puffs into the lungs every 6 (six) hours as needed for wheezing or shortness of breath. 1 Inhaler 2  . albuterol (PROVENTIL) (2.5 MG/3ML) 0.083% nebulizer solution Take 3 mLs (2.5 mg total) by nebulization every 6 (six) hours as needed for wheezing or shortness of breath. 150 mL 1  . ALPRAZolam (XANAX) 0.5 MG tablet Take 0.5 tablets (0.25 mg total) by mouth 2 (two) times daily as needed for anxiety. 30 tablet 0  . amLODipine-benazepril (LOTREL) 10-40 MG capsule Take 1 capsule by mouth daily. 90 capsule 3  . ARIPiprazole (ABILIFY) 15 MG tablet Take 0.5 tablets (7.5 mg total) by mouth daily. 30 tablet 0  . b complex vitamins tablet Take 1 tablet by mouth daily.    . benzonatate (TESSALON) 100 MG capsule Take 1 capsule (100 mg total) by mouth 3 (three) times daily as needed for  cough. 30 capsule 0  . ferrous sulfate 325 (65 FE) MG tablet Take 1 tablet (325 mg total) by mouth 2 (two) times daily with a meal. 60 tablet 0  . fexofenadine (ALLEGRA) 180 MG tablet Take 1 tablet (180 mg total) by mouth daily. 30 tablet 0  . furosemide (LASIX) 20 MG tablet Take 1 tablet (20 mg total) by mouth daily. 90 tablet 1  . metoprolol succinate (TOPROL-XL) 50 MG 24 hr tablet Take 1 tablet (50 mg total) by mouth daily. Take with or immediately following a meal.    . Multiple Vitamin (MULTIVITAMIN WITH MINERALS) TABS tablet Take 1 tablet by mouth daily.    . Nebulizers (VIOS AEROSOL DELIVERY SYSTEM) MISC 1 each as directed.  0  .  nitroGLYCERIN (NITROSTAT) 0.4 MG SL tablet Place 1 tablet (0.4 mg total) under the tongue every 5 (five) minutes as needed for chest pain. 25 tablet 3  . omeprazole (PRILOSEC) 40 MG capsule Take 1 capsule (40 mg total) by mouth daily. 90 capsule 0  . potassium chloride (K-DUR,KLOR-CON) 10 MEQ tablet Take 1 tablet (10 mEq total) by mouth daily. 30 tablet 0  . sertraline (ZOLOFT) 100 MG tablet Take 1.5 tablets (150 mg total) by mouth daily. 30 tablet 0  . sildenafil (VIAGRA) 50 MG tablet Take 1 tablet (50 mg total) by mouth daily as needed for erectile dysfunction. 10 tablet 0   No current facility-administered medications for this visit.     Allergies:   Patient has no known allergies.    Social History:  The patient  reports that he has quit smoking. His smoking use included cigarettes. He has never used smokeless tobacco. He reports that he does not drink alcohol or use drugs.   Family History:  The patient's family history includes Hypertension in his mother; Stroke (age of onset: 57) in his mother.    ROS: All other systems are reviewed and negative. Unless otherwise mentioned in H&P    PHYSICAL EXAM: VS:  BP (!) 98/58 (BP Location: Right Arm, Patient Position: Sitting, Cuff Size: Large)   Pulse 63   Ht 5\' 7"  (1.702 m)   Wt 258 lb 9.6 oz (117.3 kg)   SpO2 93%   BMI 40.50 kg/m  , BMI Body mass index is 40.5 kg/m. GEN: Well nourished, well developed, in no acute distress obese HEENT: normal  Neck: no JVD, carotid bruits, or masses Cardiac: RRR; no murmurs, rubs, or gallops,no edema  Respiratory:  Clear to auscultation bilaterally, normal work of breathing GI: soft, nontender, nondistended, + BS MS: no deformity or atrophy  Skin: warm and dry, no rash Neuro:  Strength and sensation are intact Psych: euthymic mood, full affect   EKG: Not completed this office visit.  Recent Labs: 10/21/2017: B Natriuretic Peptide 122.5 10/22/2017: ALT 76 10/25/2017: Hemoglobin 12.8; Platelets  312 11/01/2017: BUN 12; Creatinine, Ser 1.15; Potassium 4.1; Sodium 137    Lipid Panel    Component Value Date/Time   CHOL 200 06/30/2015 1604   TRIG 91 06/30/2015 1604   HDL 43 06/30/2015 1604   CHOLHDL 4.7 06/30/2015 1604   VLDL 18 06/30/2015 1604   LDLCALC 139 (H) 06/30/2015 1604      Wt Readings from Last 3 Encounters:  11/16/17 258 lb 9.6 oz (117.3 kg)  11/10/17 257 lb (116.6 kg)  11/08/17 257 lb 9.6 oz (116.8 kg)      Other studies Reviewed: Echocardiogram 10-30-17 Left ventricle: The cavity size was normal. There was moderate  concentric hypertrophy. Systolic function was normal. The   estimated ejection fraction was in the range of 60% to 65%. Wall   motion was normal; there were no regional wall motion   abnormalities. The deceleration time of the early transmitral   flow velocity was normal. Left ventricular diastolic function   parameters were normal. - Aortic valve: There was trivial regurgitation. - Aorta: Ascending aorta diameter: 43 mm (ED). - Ascending aorta: The ascending aorta was mildly dilated. - Mitral valve: Calcified annulus. Mild focal calcification of the   anterior leaflet. - Right ventricle: The cavity size was moderately dilated. Wall   thickness was normal. - Pulmonary arteries: PA peak pressure: 38 mm Hg (S).  Impressions:  - The right ventricular systolic pressure was increased consistent   with mild pulmonary hypertension.  ASSESSMENT AND PLAN:  1.  Chest pain: Stress test low risk, without evidence of ischemia.  This is explained to the patient and reassurance is given.  A letter to return to work without restrictions is provided.  2.  Chronic dyspnea: History of OSA in the past, stopped wearing CPAP due to ill fitting mask.  The patient will have a repeat sleep study dated November 24, 2017.  If positive he will be referred to our sleep fitted for CPAP which would better suited for him.  3.  History of hypertension: He has been  persistently hypotensive on last 2 office visits.  I am going to decrease his metoprolol succinate 100 mg, to metoprolol succinate 50 mg daily.  If he remains hypotensive or begin CPAP with blood pressure normalization will dose of amlodipine/benazepril.   Current medicines are reviewed at length with the patient today.    Labs/ tests ordered today include: none  Phill Myron. West Pugh, ANP, AACC   11/16/2017 2:56 PM    [place] [address], [place], [place] [address] Phone: [phone]; Fax: [phone]

## 2017-11-16 ENCOUNTER — Ambulatory Visit: Payer: BLUE CROSS/BLUE SHIELD | Admitting: Adult Health

## 2017-11-16 ENCOUNTER — Telehealth: Payer: Self-pay | Admitting: Adult Health

## 2017-11-16 ENCOUNTER — Encounter: Payer: Self-pay | Admitting: Adult Health

## 2017-11-16 VITALS — BP 98/58 | HR 63 | Ht 67.0 in | Wt 258.6 lb

## 2017-11-16 DIAGNOSIS — R0789 Other chest pain: Secondary | ICD-10-CM

## 2017-11-16 DIAGNOSIS — R0602 Shortness of breath: Secondary | ICD-10-CM

## 2017-11-16 DIAGNOSIS — I10 Essential (primary) hypertension: Secondary | ICD-10-CM | POA: Diagnosis not present

## 2017-11-16 MED ORDER — METOPROLOL SUCCINATE ER 50 MG PO TB24: 50.0000 mg | ORAL_TABLET | Freq: Every day | ORAL | Status: DC

## 2017-11-16 NOTE — Telephone Encounter (Signed)
Left a message to call back.

## 2017-11-16 NOTE — Patient Instructions (Signed)
Medication Instructions:  DECREASE METOPROLOL SUCCINATE XL 50MG  DAILY  If you need a refill on your cardiac medications before your next appointment, please call your pharmacy.  Special Instructions: RETURN TO WORK 11-21-2017 WITH NO RESTRICTIONS  Follow-Up: Your physician wants you to follow-up in: [place]    Thank you for choosing [facility] at [facility]!!

## 2017-11-16 NOTE — Telephone Encounter (Signed)
New Message    Patient does not want you to low his medication his bp at home  is 114/78 and 110/70 , your reading at the office might be wrong.

## 2017-11-17 ENCOUNTER — Telehealth: Payer: Self-pay | Admitting: *Deleted

## 2017-11-17 NOTE — Telephone Encounter (Signed)
1.  Pt came in to the office. He states cardiology gave him clearance to return to work on 11/21/17 and wanted to know if PCP agrees with return to work date and if so, he would like letter from [place] as well.    2.  Pt reports BP reading at cardiology yesterday of 98/58 and cardiology told him to reduce metoprolol to 50mg  once a day. He has been taking 100mg  once a day.  Pt states he checks BP at home and yesterday had readings of 110/70 and 114/78. Pt does not want to decrease metoprolol as he feels his BP is finally coming  I did check BP while pt was here today and reading is 124/78.  Please advise?

## 2017-11-17 NOTE — Telephone Encounter (Signed)
OK to return to work on 4/1, I think it is OK to decrease BP med and keep checking at home.  Goal blood pressure is <130/80. If readings consistently running higher with this change please let us know.

## 2017-11-18 ENCOUNTER — Telehealth: Payer: Self-pay | Admitting: *Deleted

## 2017-11-18 NOTE — Telephone Encounter (Signed)
Received FMLA/STD paperwork from [facility], completed as much as possible; forwarded to provider. Also, last Annual Screening Medical Evaluations done by [facility] via employer/SLS

## 2017-11-21 ENCOUNTER — Ambulatory Visit: Payer: BLUE CROSS/BLUE SHIELD | Admitting: Family

## 2017-11-21 NOTE — Telephone Encounter (Signed)
Per pt's wife pt went to PMD and had  B/p addressed .Adonis Housekeeper

## 2017-11-21 NOTE — Telephone Encounter (Signed)
Return to work letter placed at front desk for pick up and [employer] sent to pt.

## 2017-11-28 ENCOUNTER — Ambulatory Visit (HOSPITAL_BASED_OUTPATIENT_CLINIC_OR_DEPARTMENT_OTHER): Payer: BLUE CROSS/BLUE SHIELD | Attending: Family | Admitting: Pulmonary Disease

## 2017-11-28 VITALS — Ht 67.0 in | Wt 255.0 lb

## 2017-11-28 DIAGNOSIS — G4733 Obstructive sleep apnea (adult) (pediatric): Secondary | ICD-10-CM | POA: Insufficient documentation

## 2017-11-28 DIAGNOSIS — R0683 Snoring: Secondary | ICD-10-CM | POA: Insufficient documentation

## 2017-12-01 NOTE — Telephone Encounter (Signed)
Completed and faxed 11/30/17.Marland KitchenSLS

## 2017-12-05 DIAGNOSIS — G4733 Obstructive sleep apnea (adult) (pediatric): Secondary | ICD-10-CM | POA: Diagnosis not present

## 2017-12-05 NOTE — Procedures (Signed)
Patient Name: Jeremy [name], Jeremy [name] Date: 11/28/2017 Gender: Male D.O.B: [birth year] Age (years): 33 Referring Provider: Earlie Counts Height (inches): 67 Interpreting Physician: Kara Mead MD, ABSM Weight (lbs): 255 RPSGT: Carolin Coy BMI: 40 MRN: [id number] Neck Size: 18.00 <br> <br> CLINICAL INFORMATION The patient is referred for a split night study , known h/o OSA   SLEEP STUDY TECHNIQUE As per the AASM Manual for the Scoring of Sleep and Associated Events v2.3 (April 2016) with a hypopnea requiring 4% desaturations.  The channels recorded and monitored were frontal, central and occipital EEG, electrooculogram (EOG), submentalis EMG (chin), nasal and oral airflow, thoracic and abdominal wall motion, anterior tibialis EMG, snore microphone, electrocardiogram, and pulse oximetry. Bi-level positive airway pressure (BiPAP) was initiated when the patient met split night criteria and was titrated according to treat sleep-disordered breathing.  RESPIRATORY PARAMETERS Diagnostic  Total AHI (/hr): 44.7 RDI (/hr): 45.1 OA Index (/hr): 0.4 CA Index (/hr): 4.8 REM AHI (/hr): 55.5 NREM AHI (/hr): 40.4 Supine AHI (/hr): N/A Non-supine AHI (/hr): 45.11 Min O2 Sat (%): 71.0 Mean O2 (%): 86.6 Time below 88% (min): 81.4   Titration  Optimal IPAP Pressure (cm): 23 Optimal EPAP Pressure (cm): 19 AHI at Optimal Pressure (/hr): 98.1 Min O2 at Optimal Pressure (%): 89.0 Sleep % at Optimal (%): 100 Supine % at Optimal (%): 100     SLEEP ARCHITECTURE The study was initiated at 10:44:12 PM and terminated at 5:32:32 AM. The total recorded time was 408.3 minutes. EEG confirmed total sleep time was 287.9 minutes yielding a sleep efficiency of 70.5%%. Sleep onset after lights out was 27.9 minutes with a REM latency of 86.5 minutes. The patient spent 9.0%% of the night in stage N1 sleep, 67.9%% in stage N2 sleep, 0.0%% in stage N3 and 23.10% in REM. Wake after sleep onset (WASO) was 92.5 minutes.  The Arousal Index was 12.5/hour.  LEG MOVEMENT DATA The total Periodic Limb Movements of Sleep (PLMS) were 0. The PLMS index was 0.0 .  CARDIAC DATA The 2 lead EKG demonstrated sinus rhythm. The mean heart rate was 100.0 beats per minute. Other EKG findings include: None.    IMPRESSIONS - Severe obstructive sleep apnea occurred during the diagnostic portion of the study (AHI = 44.7 /hour).  - Central apneas emerged at higher levels of PAP pressure. Bilevel was tried. An optimal PAP pressure was selected for this patient ( 23 /19 cm of water) - Mild central sleep apnea occurred during the diagnostic portion of the study (CAI = 4.8/hour). - Moderate oxygen desaturation was noted during the diagnostic portion of the study (Min O2 = 71.0%). - The patient snored with moderate snoring volume during the diagnostic portion of the study. - No cardiac abnormalities were noted during this study. - Clinically significant periodic limb movements of sleep did not occur during the study.   DIAGNOSIS - Obstructive Sleep Apnea (327.23 [G47.33 ICD-10])   RECOMMENDATIONS - Trial of autoCPAP 10-20 cm with a Medium Wide size Philips Respironics Full Face Mask Dreamwear mask and heated humidification. If that does not work, may need  BiPAP therapy on 23/19 cm H2O - however would need to review download for central apneas - Avoid alcohol, sedatives and other CNS depressants that may worsen sleep apnea and disrupt normal sleep architecture. - Sleep hygiene should be reviewed to assess factors that may improve sleep quality. - Weight management and regular exercise should be initiated or continued. - Return to Sleep Center for evaluation.    Kara Mead  MD Board Certified in Sleep medicine

## 2017-12-08 ENCOUNTER — Telehealth: Payer: Self-pay | Admitting: *Deleted

## 2017-12-08 DIAGNOSIS — G4733 Obstructive sleep apnea (adult) (pediatric): Secondary | ICD-10-CM

## 2017-12-08 NOTE — Telephone Encounter (Signed)
Attempted to reach pt. Also needs to know if he currently has a CPAP. Pt out of town until Monday. Advised spouse I will call him back then.

## 2017-12-08 NOTE — Telephone Encounter (Signed)
-----   Message from [name], NP sent at 12/06/2017  3:37 PM EDT ----- Please place home Dme referral for home autopsy, 20-30 cm medium wide sized philips respironics full face mask dreamwear mask, heated humidification, download in 2 weeks.   Also, is he established with Dr. Elsworth Soho? If not please place referral/arrange follow up with him.  Follow up with me in 1 month.

## 2017-12-12 ENCOUNTER — Other Ambulatory Visit: Payer: Self-pay | Admitting: Family

## 2017-12-13 NOTE — Telephone Encounter (Signed)
Spoke with pt. He does not have CPAP currently and is not established with Dr Elsworth Soho. He is agreeable to referral and referral has been placed. CPAP order, sleep study result, demographics, last OV and Ins card faxed to [place] at [phone].

## 2017-12-19 DIAGNOSIS — F410 Panic disorder [episodic paroxysmal anxiety] without agoraphobia: Secondary | ICD-10-CM | POA: Diagnosis not present

## 2017-12-26 NOTE — Telephone Encounter (Signed)
Received fax failure notice twice. Spoke with Melissa at [place] today and verified that they have not received our fax but they will pull the needed information from Epic and start CPAP process. Notified pt's spouse of reason for delay and that they should be contacted in the next 1-2 weeks.

## 2017-12-30 ENCOUNTER — Telehealth: Payer: Self-pay | Admitting: *Deleted

## 2017-12-30 NOTE — Telephone Encounter (Signed)
Dr. Elsworth Soho, This patient is scheduled to establish with you in a few weeks for sleep consult. You read his study.  What are your thoughts on the below recommendations please? thanks

## 2017-12-30 NOTE — Telephone Encounter (Signed)
Spoke with Gilda. She states CPAP setting only goes from 5 - 20, bi-pap only goes up to 25. Her supervisor reviewed report and said that the AHI was only 96 on the bi-pap during the titration study. She thinks pt would benefit from a bi-pap ST which would require and ASV titration study.  Please advise?  Copied from [place] [id number]. Topic: General - Other >> Dec 29, 2017  2:05 PM Carolyn Stare wrote:  Bethanne Ginger with [facility] call to say they received a cpap order but need a BIPAP order  [phone]

## 2017-12-30 NOTE — Telephone Encounter (Signed)
Would start with Trial of autoCPAP 10-20 cm with a Medium Wide size Philips Respironics Full Face Mask Dreamwear mask   I will review download on OV & decide if he needs bipap or other device

## 2017-12-30 NOTE — Telephone Encounter (Signed)
Could you please contact Home health with Dr. Bari Mantis recommendations?

## 2018-01-02 NOTE — Telephone Encounter (Signed)
Notified Gilda of below setting recommendations and they will proceed as below.

## 2018-01-06 ENCOUNTER — Telehealth: Payer: Self-pay | Admitting: *Deleted

## 2018-01-06 NOTE — Telephone Encounter (Signed)
Received Physician Orders from [place]; forwarded to provider/SLS 05/17

## 2018-01-13 ENCOUNTER — Telehealth: Payer: Self-pay | Admitting: Family

## 2018-01-13 ENCOUNTER — Encounter: Payer: Self-pay | Admitting: Family

## 2018-01-13 ENCOUNTER — Ambulatory Visit: Payer: BLUE CROSS/BLUE SHIELD | Admitting: Family

## 2018-01-13 VITALS — BP 122/67 | HR 56 | Temp 98.2°F | Resp 16 | Ht 67.0 in | Wt 252.6 lb

## 2018-01-13 DIAGNOSIS — N529 Male erectile dysfunction, unspecified: Secondary | ICD-10-CM | POA: Diagnosis not present

## 2018-01-13 DIAGNOSIS — F418 Other specified anxiety disorders: Secondary | ICD-10-CM | POA: Diagnosis not present

## 2018-01-13 DIAGNOSIS — G4733 Obstructive sleep apnea (adult) (pediatric): Secondary | ICD-10-CM | POA: Diagnosis not present

## 2018-01-13 DIAGNOSIS — I5032 Chronic diastolic (congestive) heart failure: Secondary | ICD-10-CM

## 2018-01-13 DIAGNOSIS — I10 Essential (primary) hypertension: Secondary | ICD-10-CM

## 2018-01-13 MED ORDER — AMLODIPINE BESY-BENAZEPRIL HCL 10-40 MG PO CAPS: 1.0000 | ORAL_CAPSULE | Freq: Every day | ORAL | 3 refills | Status: DC

## 2018-01-13 MED ORDER — SILDENAFIL CITRATE 100 MG PO TABS: 100.0000 mg | ORAL_TABLET | Freq: Every day | ORAL | 2 refills | Status: DC | PRN

## 2018-01-13 MED ORDER — AMLODIPINE BESY-BENAZEPRIL HCL 10-40 MG PO CAPS: 1.0000 | ORAL_CAPSULE | Freq: Every day | ORAL | 1 refills | Status: DC

## 2018-01-13 NOTE — Telephone Encounter (Signed)
Could you please check status of cpap from [place]? Pt has not heard yet.

## 2018-01-13 NOTE — Progress Notes (Signed)
Subjective:    [name], male    DOB: 11-Jun-1956, 62 y.o.   MRN: [id number]  HPI  Mr. Kreider is a 62 yr old male who presents today for follow up.  Chronic diastolic heart failure-continues lasix. Denies SOB or swelling. Denies CP.   Wt Readings from Last 3 Encounters:  01/13/18 252 lb 9.6 oz (114.6 kg)  11/28/17 255 lb (115.7 kg)  11/16/17 258 lb 9.6 oz (117.3 kg)   OSA-  Pt had sleep study which noted OSA.  States he has not heard back about the machine yet. He does have an upcoming consult with the sleep specialist in June.  HTN-  BP Readings from Last 3 Encounters:  01/13/18 122/67  11/16/17 (!) 98/58  11/08/17 116/78   Reports mood is good on sertaline.     ED- notes that with viagra 50mg  he is only able to obtain a "weak" erection. Reports pharmacy would not fill the viagra because he had nitro rx. He states he has never needed to use nitro, denies hx of chest pain. Had neg stress test.    Review of Systems    see HPI  Past Medical History:  Diagnosis Date  . Anemia 11/16/2014  . Bladder disease    "an unusual bladder disease; don't know what it's called" (06/30/2015)  . Bulging lumbar disc   . GERD (gastroesophageal reflux disease)   . History of hiatal hernia   . Hyperglycemia 11/16/2014  . Hyperlipemia   . Hypertension   . Joint pain, knee   . Migraine    06/30/2015 "maybe 2-3 times/year; not as bad as I used to have them"  . OSA (obstructive sleep apnea)    "dr took me off the mask; told me to lose weight" (06/30/2015)     Social History   Socioeconomic History  . Marital status: Married    Spouse name: Not on file  . Number of children: 3  . Years of education: Not on file  . Highest education level: Not on file  Occupational History  . Not on file  Social Needs  . Financial resource strain: Not on file  . Food insecurity:    Worry: Not on file    Inability: Not on file  . Transportation needs:    Medical: Not on file   Non-medical: Not on file  Tobacco Use  . Smoking status: Former Smoker    Types: Cigarettes  . Smokeless tobacco: Never Used  . Tobacco comment: "stopped smoking in ~ 1990"  Substance and Sexual Activity  . Alcohol use: No    Alcohol/week: 0.0 oz  . Drug use: No  . Sexual activity: Yes  Lifestyle  . Physical activity:    Days per week: Not on file    Minutes per session: Not on file  . Stress: Not on file  Relationships  . Social connections:    Talks on phone: Not on file    Gets together: Not on file    Attends religious service: Not on file    Active member of club or organization: Not on file    Attends meetings of clubs or organizations: Not on file    Relationship status: Not on file  . Intimate partner violence:    Fear of current or ex partner: Not on file    Emotionally abused: Not on file    Physically abused: Not on file    Forced sexual activity: Not on file  Other  Topics Concern  . 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 ESOPHAGOGASTRODUODENOSCOPY    . ESOPHAGOGASTRODUODENOSCOPY (EGD) WITH ESOPHAGEAL DILATION  ~ 2014  . KNEE ARTHROSCOPY Right 1995  . NASAL SEPTUM SURGERY  ~ 1972  . TOTAL KNEE ARTHROPLASTY  10/11/2011   Procedure: TOTAL KNEE ARTHROPLASTY;  Surgeon: Rudean Haskell, MD;  Location: [facility];  Service: Orthopedics;  Laterality: Right;  . 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 Stroke Mother 55       Died of CVA  . Hypertension Mother   . Anesthesia problems Neg Hx   . Hypotension Neg Hx   . Malignant hyperthermia Neg Hx   . Pseudochol deficiency Neg Hx     No Known Allergies  Current Outpatient Medications on File Prior to Visit  Medication Sig Dispense Refill  . albuterol  (PROVENTIL HFA;VENTOLIN HFA) 108 (90 Base) MCG/ACT inhaler Inhale 2 puffs into the lungs every 6 (six) hours as needed for wheezing or shortness of breath. 1 Inhaler 2  . albuterol (PROVENTIL) (2.5 MG/3ML) 0.083% nebulizer solution Take 3 mLs (2.5 mg total) by nebulization every 6 (six) hours as needed for wheezing or shortness of breath. 150 mL 1  . ALPRAZolam (XANAX) 0.5 MG tablet Take 0.5 tablets (0.25 mg total) by mouth 2 (two) times daily as needed for anxiety. 30 tablet 0  . amLODipine-benazepril (LOTREL) 10-40 MG capsule Take 1 capsule by mouth daily. 90 capsule 3  . ARIPiprazole (ABILIFY) 15 MG tablet Take 0.5 tablets (7.5 mg total) by mouth daily. 30 tablet 0  . b complex vitamins tablet Take 1 tablet by mouth daily.    . benzonatate (TESSALON) 100 MG capsule Take 1 capsule (100 mg total) by mouth 3 (three) times daily as needed for cough. 30 capsule 0  . ferrous sulfate 325 (65 FE) MG tablet Take 1 tablet (325 mg total) by mouth 2 (two) times daily with a meal. 60 tablet 0  . fexofenadine (ALLEGRA) 180 MG tablet Take 1 tablet (180 mg total) by mouth daily. 30 tablet 0  . furosemide (LASIX) 20 MG tablet Take 1 tablet (20 mg total) by mouth daily. 90 tablet 1  . hydrochlorothiazide (HYDRODIURIL) 25 MG tablet TAKE 1 TABLET DAILY 90 tablet 1  . metoprolol succinate (TOPROL-XL) 50 MG 24 hr tablet Take 1 tablet (50 mg total) by mouth daily. Take with or immediately following a meal.    . Multiple Vitamin (MULTIVITAMIN WITH MINERALS) TABS tablet Take 1 tablet by mouth daily.    . Nebulizers (VIOS AEROSOL DELIVERY SYSTEM) MISC 1 each as directed.  0  . nitroGLYCERIN (NITROSTAT) 0.4 MG SL tablet Place 1 tablet (0.4 mg total) under the tongue every 5 (five) minutes as needed for chest pain. 25 tablet 3  . omeprazole (PRILOSEC) 40 MG capsule Take 1 capsule (40 mg total) by mouth daily. 90 capsule 0  . potassium chloride (K-DUR,KLOR-CON) 10 MEQ tablet Take 1 tablet (10 mEq total) by mouth daily. 30  tablet 0  . sertraline (ZOLOFT) 100 MG tablet Take 1.5 tablets (150 mg total) by mouth daily. 30 tablet 0  . sildenafil (VIAGRA) 50 MG tablet Take 1 tablet (50 mg  total) by mouth daily as needed for erectile dysfunction. 10 tablet 0   No current facility-administered medications on file prior to visit.     BP 122/67 (BP Location: Right Arm, Patient Position: Sitting, Cuff Size: Large)   Pulse (!) 56   Temp 98.2 F (36.8 C) (Oral)   Resp 16   Ht 5\' 7"  (1.702 m)   Wt 252 lb 9.6 oz (114.6 kg)   SpO2 92%   BMI 39.56 kg/m    Objective:   Physical Exam  Constitutional: He is oriented to person, place, and time. He appears well-developed and well-nourished. No distress.  HENT:  Head: Normocephalic and atraumatic.  Cardiovascular: Normal rate and regular rhythm.  No murmur heard. Pulmonary/Chest: Effort normal and breath sounds normal. No respiratory distress. He has no wheezes. He has no rales.  Musculoskeletal: He exhibits no edema.  Neurological: He is alert and oriented to person, place, and time.  Skin: Skin is warm and dry.  Psychiatric: He has a normal mood and affect. His behavior is normal. Thought content normal.          Assessment & Plan:  Chronic diastolic CHF- appears euvolemic, continue currrent dose of lasix.  OSA- will check status of cpap. Pt advised to keep upcoming apt with Dr. Elsworth Soho.  ED- uncontrolled.  Rx sent for trial of viagra 100mg . D/c nitro.   Anxiety/depression- stable on sertraline, continue same.

## 2018-01-13 NOTE — Patient Instructions (Addendum)
Please complete lab work prior to leaving.   

## 2018-01-14 LAB — BASIC METABOLIC PANEL
BUN: 22 mg/dL (ref 7–25)
CO2: 31 mmol/L (ref 20–32)
CREATININE: 1.05 mg/dL (ref 0.70–1.25)
Calcium: 9.3 mg/dL (ref 8.6–10.3)
Chloride: 104 mmol/L (ref 98–110)
Glucose, Bld: 89 mg/dL (ref 65–99)
POTASSIUM: 3.6 mmol/L (ref 3.5–5.3)
Sodium: 143 mmol/L (ref 135–146)

## 2018-01-17 NOTE — Telephone Encounter (Signed)
Order was sent to [facility] in [place], [phone]. Spoke with Tiffany and she states CPAP is ready for delivery but they need to talk with pt first and they have left message for pt to call them back. Spoke with pt's spouse and provided her with # above and to choose option 1. Spouse voices understanding.

## 2018-01-17 NOTE — Telephone Encounter (Signed)
Attempted to reach [facility]; line is busy.

## 2018-01-23 DIAGNOSIS — G4733 Obstructive sleep apnea (adult) (pediatric): Secondary | ICD-10-CM | POA: Diagnosis not present

## 2018-02-03 ENCOUNTER — Encounter: Payer: Self-pay | Admitting: Pulmonary Disease

## 2018-02-03 ENCOUNTER — Ambulatory Visit: Payer: BLUE CROSS/BLUE SHIELD | Admitting: Pulmonary Disease

## 2018-02-03 VITALS — BP 128/84 | HR 60 | Ht 67.0 in | Wt 256.4 lb

## 2018-02-03 DIAGNOSIS — G4733 Obstructive sleep apnea (adult) (pediatric): Secondary | ICD-10-CM

## 2018-02-03 NOTE — Patient Instructions (Addendum)
We will check download onmachine and treat the pressure settings as required.  CPAP supplies will be renewed for a year

## 2018-02-03 NOTE — Progress Notes (Signed)
Subjective:    [name], male    DOB: Mar 17, 1956, 62 y.o.   MRN: [id number]  HPI  Chief Complaint  Patient presents with  . Sleep Consult    Referred by [name] for OSA. Patient uses [facility] as his DME.      62 year old obese man presents for management of obstructive sleep apnea. He is accompanied by his wife who provides most of the history.  He was diagnosed with obstructive sleep apnea in the past, use CPAP for about 2 years but stopped using it because of the noise the machine made per his wife, but he feels that he lost weight and the doctor took him off the machine. He developed an episode of bronchitis in 10/2017 and was hospitalized and then underwent another sleep evaluation.  CT angiogram was negative for pulmonary embolism NPSG 11/2017 showed severe OSA with AHI 45/hour with lowest desaturation of 71%.  He required high pressures on the CPAP and ultimately was placed on BiPAP of 23/19 cm.  Based on this, he was started on auto CPAP with full facemask.  He has just received machine for the past week and feels much improved after using this.  He has been able to tolerate the full facemask.  Epworth sleepiness score is 7 and he reports sleepiness while watching TV and lying down to rest in the afternoons. Bedtime is around 9:30 PM, sleep latency is about 10 minutes, he sleeps on his side with one pillow, reports 2-3 nocturnal awakenings including nocturia and is out of bed by 4:15 AM feeling rested without dryness of mouth or headaches.  CPAP is helped improve his daytime somnolence and fatigue. He has gained about 30 pounds over the last 2 years  There is no history suggestive of cataplexy, sleep paralysis or parasomnias    He has hypertension controlled on 3 medications and hyperlipidemia  Past Medical History:  Diagnosis Date  . Anemia 11/16/2014  . Bladder disease    "an unusual bladder disease; don't know what it's called" (06/30/2015)  .  Bulging lumbar disc   . GERD (gastroesophageal reflux disease)   . History of hiatal hernia   . Hyperglycemia 11/16/2014  . Hyperlipemia   . Hypertension   . Joint pain, knee   . Migraine    06/30/2015 "maybe 2-3 times/year; not as bad as I used to have them"  . OSA (obstructive sleep apnea)    "dr took me off the mask; told me to lose weight" (06/30/2015)     Past Surgical History:  Procedure Laterality Date  . ESOPHAGOGASTRODUODENOSCOPY    . ESOPHAGOGASTRODUODENOSCOPY (EGD) WITH ESOPHAGEAL DILATION  ~ 2014  . KNEE ARTHROSCOPY Right 1995  . NASAL SEPTUM SURGERY  ~ 1972  . TOTAL KNEE ARTHROPLASTY  10/11/2011   Procedure: TOTAL KNEE ARTHROPLASTY;  Surgeon: Rudean Haskell, MD;  Location: [facility];  Service: Orthopedics;  Laterality: Right;  . 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)  ~2014    No Known Allergies  Social History   Socioeconomic History  . Marital status: Married    Spouse name: Not on file  . Number of children: 3  . Years of education: Not on file  . Highest education level: Not on file  Occupational History  . Not on file  Social Needs  . Financial resource strain: Not on file  . Food insecurity:    Worry: Not on file    Inability: Not on file  .  Transportation needs:    Medical: Not on file    Non-medical: Not on file  Tobacco Use  . Smoking status: Former Smoker    Types: Cigarettes  . Smokeless tobacco: Never Used  . Tobacco comment: "stopped smoking in ~ 1990"  Substance and Sexual Activity  . Alcohol use: No    Alcohol/week: 0.0 oz  . Drug use: No  . Sexual activity: Yes  Lifestyle  . Physical activity:    Days per week: Not on file    Minutes per session: Not on file  . Stress: Not on file  Relationships  . Social connections:    Talks on phone: Not on file    Gets together: Not on file    Attends religious service: Not on file    Active member of club or organization: Not on file    Attends meetings of clubs or  organizations: Not on file    Relationship status: Not on file  . Intimate partner violence:    Fear of current or ex partner: Not on file    Emotionally abused: Not on file    Physically abused: Not on file    Forced sexual activity: Not on file  Other Topics Concern  . 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 Stroke Mother 61       Died of CVA  . Hypertension Mother   . Anesthesia problems Neg Hx   . Hypotension Neg Hx   . Malignant hyperthermia Neg Hx   . Pseudochol deficiency Neg Hx      Review of Systems  Constitutional: Positive for unexpected weight change. Negative for fever.  HENT: Negative for congestion, dental problem, ear pain, nosebleeds, postnasal drip, rhinorrhea, sinus pressure, sneezing, sore throat and trouble swallowing.   Eyes: Negative for redness and itching.  Respiratory: Positive for shortness of breath. Negative for cough, chest tightness and wheezing.   Cardiovascular: Negative for palpitations and leg swelling.  Gastrointestinal: Negative for nausea and vomiting.  Genitourinary: Negative for dysuria.  Musculoskeletal: Positive for joint swelling.  Skin: Negative for rash.  Allergic/Immunologic: Negative.  Negative for environmental allergies, food allergies and immunocompromised state.  Neurological: Negative for headaches.  Hematological: Does not bruise/bleed easily.  Psychiatric/Behavioral: Negative for dysphoric mood. The patient is nervous/anxious.        Objective:   Physical Exam  Gen. Pleasant, obese, in no distress, normal affect ENT - no lesions, no post nasal drip, class 2-3 airway Neck: No JVD, no thyromegaly, no carotid bruits Lungs: no use of accessory muscles, no dullness to percussion, decreased without rales or rhonchi  Cardiovascular: Rhythm regular,  heart sounds  normal, no murmurs or gallops, no peripheral edema Abdomen: soft and non-tender, no hepatosplenomegaly, BS normal. Musculoskeletal: No deformities, no cyanosis or clubbing Neuro:  alert, non focal, no tremors        Assessment & Plan:

## 2018-02-03 NOTE — Assessment & Plan Note (Signed)
The pathophysiology of obstructive sleep apnea , it's cardiovascular consequences & modes of treatment including CPAP were discused with the patient in detail & they evidenced understanding.  We will check download onmachine and tweak the pressure settings as required.  He seems to have adjusted well and this is a good sign  CPAP supplies will be renewed for a year  Weight loss encouraged, compliance with goal of at least 4-6 hrs every night is the expectation. Advised against medications with sedative side effects Cautioned against driving when sleepy - understanding that sleepiness will vary on a day to day basis

## 2018-02-06 ENCOUNTER — Ambulatory Visit: Payer: BLUE CROSS/BLUE SHIELD | Admitting: Family

## 2018-02-13 DIAGNOSIS — F410 Panic disorder [episodic paroxysmal anxiety] without agoraphobia: Secondary | ICD-10-CM | POA: Diagnosis not present

## 2018-02-20 ENCOUNTER — Encounter: Payer: Self-pay | Admitting: Family

## 2018-02-20 ENCOUNTER — Ambulatory Visit: Payer: BLUE CROSS/BLUE SHIELD | Admitting: Family

## 2018-02-20 VITALS — BP 118/64 | HR 59 | Temp 98.6°F | Resp 16 | Ht 67.0 in | Wt 261.0 lb

## 2018-02-20 DIAGNOSIS — N529 Male erectile dysfunction, unspecified: Secondary | ICD-10-CM

## 2018-02-20 DIAGNOSIS — G4733 Obstructive sleep apnea (adult) (pediatric): Secondary | ICD-10-CM | POA: Diagnosis not present

## 2018-02-20 NOTE — Patient Instructions (Signed)
Please continue cpap and your work on healthy diet, exercise and weight loss.

## 2018-02-20 NOTE — Progress Notes (Signed)
Subjective:    [name], male    DOB: September 03, 1955, 62 y.o.   MRN: [id number]  HPI   Jeremy [name] is a 62 yr old male who presents today for follow up.  OSA- Saw Dr. Elsworth Soho on 6/14 (sleep specialist). Consult reviewed. Reports that his daytime sleepiness is improved on cpap.    ED- last visit we dc'd his nitro rx and sent rx for prn viagra. He reports that he was able to fill the rx and it has been working well for him.     Wt Readings from Last 3 Encounters:  02/20/18 261 lb (118.4 kg)  02/03/18 256 lb 6.4 oz (116.3 kg)  01/13/18 252 lb 9.6 oz (114.6 kg)    Dr. Clovis Pu, psychiatry.  Review of Systems See HPI  Past Medical History:  Diagnosis Date  . Anemia 11/16/2014  . Bladder disease    "an unusual bladder disease; don't know what it's called" (06/30/2015)  . Bulging lumbar disc   . GERD (gastroesophageal reflux disease)   . History of hiatal hernia   . Hyperglycemia 11/16/2014  . Hyperlipemia   . Hypertension   . Joint pain, knee   . Migraine    06/30/2015 "maybe 2-3 times/year; not as bad as I used to have them"  . OSA (obstructive sleep apnea)    "dr took me off the mask; told me to lose weight" (06/30/2015)     Social History   Socioeconomic History  . Marital status: Married    Spouse name: Not on file  . Number of children: 3  . Years of education: Not on file  . Highest education level: Not on file  Occupational History  . Not on file  Social Needs  . Financial resource strain: Not on file  . Food insecurity:    Worry: Not on file    Inability: Not on file  . Transportation needs:    Medical: Not on file    Non-medical: Not on file  Tobacco Use  . Smoking status: Former Smoker    Types: Cigarettes  . Smokeless tobacco: Never Used  . Tobacco comment: "stopped smoking in ~ 1990"  Substance and Sexual Activity  . Alcohol use: No    Alcohol/week: 0.0 oz  . Drug use: No  . Sexual activity: Yes  Lifestyle  . Physical activity:   Days per week: Not on file    Minutes per session: Not on file  . Stress: Not on file  Relationships  . Social connections:    Talks on phone: Not on file    Gets together: Not on file    Attends religious service: Not on file    Active member of club or organization: Not on file    Attends meetings of clubs or organizations: Not on file    Relationship status: Not on file  . Intimate partner violence:    Fear of current or ex partner: Not on file    Emotionally abused: Not on file    Physically abused: Not on file    Forced sexual activity: Not on file  Other Topics Concern  . 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 ESOPHAGOGASTRODUODENOSCOPY    . ESOPHAGOGASTRODUODENOSCOPY (EGD) WITH ESOPHAGEAL DILATION  ~  2014  . KNEE ARTHROSCOPY Right 1995  . NASAL SEPTUM SURGERY  ~ 1972  . TOTAL KNEE ARTHROPLASTY  10/11/2011   Procedure: TOTAL KNEE ARTHROPLASTY;  Surgeon: Rudean Haskell, MD;  Location: [facility];  Service: Orthopedics;  Laterality: Right;  . 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 Stroke Mother 31       Died of CVA  . Hypertension Mother   . Anesthesia problems Neg Hx   . Hypotension Neg Hx   . Malignant hyperthermia Neg Hx   . Pseudochol deficiency Neg Hx     No Known Allergies  Current Outpatient Medications on File Prior to Visit  Medication Sig Dispense Refill  . albuterol (PROVENTIL HFA;VENTOLIN HFA) 108 (90 Base) MCG/ACT inhaler Inhale 2 puffs into the lungs every 6 (six) hours as needed for wheezing or shortness of breath. 1 Inhaler 2  . albuterol (PROVENTIL) (2.5 MG/3ML) 0.083% nebulizer solution Take 3 mLs (2.5 mg total) by nebulization every 6 (six) hours as needed for wheezing or shortness of  breath. 150 mL 1  . ALPRAZolam (XANAX) 0.5 MG tablet Take 0.5 tablets (0.25 mg total) by mouth 2 (two) times daily as needed for anxiety. 30 tablet 0  . amLODipine-benazepril (LOTREL) 10-40 MG capsule Take 1 capsule by mouth daily. 90 capsule 1  . ARIPiprazole (ABILIFY) 15 MG tablet Take 0.5 tablets (7.5 mg total) by mouth daily. 30 tablet 0  . b complex vitamins tablet Take 1 tablet by mouth daily.    . benzonatate (TESSALON) 100 MG capsule Take 1 capsule (100 mg total) by mouth 3 (three) times daily as needed for cough. 30 capsule 0  . ferrous sulfate 325 (65 FE) MG tablet Take 1 tablet (325 mg total) by mouth 2 (two) times daily with a meal. 60 tablet 0  . fexofenadine (ALLEGRA) 180 MG tablet Take 1 tablet (180 mg total) by mouth daily. 30 tablet 0  . furosemide (LASIX) 20 MG tablet Take 1 tablet (20 mg total) by mouth daily. 90 tablet 1  . hydrochlorothiazide (HYDRODIURIL) 25 MG tablet TAKE 1 TABLET DAILY 90 tablet 1  . metoprolol succinate (TOPROL-XL) 50 MG 24 hr tablet Take 1 tablet (50 mg total) by mouth daily. Take with or immediately following a meal.    . Multiple Vitamin (MULTIVITAMIN WITH MINERALS) TABS tablet Take 1 tablet by mouth daily.    . Nebulizers (VIOS AEROSOL DELIVERY SYSTEM) MISC 1 each as directed.  0  . omeprazole (PRILOSEC) 40 MG capsule Take 1 capsule (40 mg total) by mouth daily. 90 capsule 0  . potassium chloride (K-DUR,KLOR-CON) 10 MEQ tablet Take 1 tablet (10 mEq total) by mouth daily. 30 tablet 0  . sertraline (ZOLOFT) 100 MG tablet Take 1.5 tablets (150 mg total) by mouth daily. 30 tablet 0  . sildenafil (VIAGRA) 100 MG tablet Take 1 tablet (100 mg total) by mouth daily as needed for erectile dysfunction. 10 tablet 2   No current facility-administered medications on file prior to visit.     BP 118/64 (BP Location: Right Arm, Patient Position: Sitting, Cuff Size: Large)   Pulse (!) 59   Temp 98.6 F (37 C) (Oral)   Resp 16   Ht 5\' 7"  (1.702 m)   Wt 261 lb  (118.4 kg)   SpO2 92%   BMI 40.88 kg/m       Objective:   Physical Exam  Constitutional:  He is oriented to person, place, and time. He appears well-developed and well-nourished. No distress.  HENT:  Head: Normocephalic and atraumatic.  Cardiovascular: Normal rate and regular rhythm.  No murmur heard. Pulmonary/Chest: Effort normal and breath sounds normal. No respiratory distress. He has no wheezes. He has no rales.  Musculoskeletal: He exhibits no edema.  Neurological: He is alert and oriented to person, place, and time.  Skin: Skin is warm and dry.  Psychiatric: He has a normal mood and affect. His behavior is normal. Thought content normal.          Assessment & Plan:  OSA- clinically improved since starting cpap.  Reinforced importance of compliance, exercise and weight loss.   ED- improved with use of viagra prn. Continue same.

## 2018-02-22 DIAGNOSIS — G4733 Obstructive sleep apnea (adult) (pediatric): Secondary | ICD-10-CM | POA: Diagnosis not present

## 2018-02-24 NOTE — Telephone Encounter (Signed)
Encounter complete. 

## 2018-03-03 ENCOUNTER — Other Ambulatory Visit: Payer: Self-pay | Admitting: Emergency Medicine

## 2018-03-03 ENCOUNTER — Ambulatory Visit (INDEPENDENT_AMBULATORY_CARE_PROVIDER_SITE_OTHER): Payer: Self-pay

## 2018-03-03 DIAGNOSIS — M5116 Intervertebral disc disorders with radiculopathy, lumbar region: Secondary | ICD-10-CM

## 2018-03-03 DIAGNOSIS — R52 Pain, unspecified: Secondary | ICD-10-CM

## 2018-03-03 DIAGNOSIS — M25552 Pain in left hip: Secondary | ICD-10-CM

## 2018-03-04 NOTE — Progress Notes (Signed)
Cardiology Office Note   Date:  03/07/2018   ID:  Jeremy [name], DOB Nov 25, 1955, MRN [id number]  PCP:  [name], NP  Cardiologist:   Minus Breeding, MD   No chief complaint on file.     History of Present Illness: Jeremy [name] is a 62 y.o. male who presents for follow up of SOB.  The patient was admitted to the hospital on March 10/25/2017 with tachypnea and dyspnea. He ruled out for PE but was noted to have haziness suggestive of small airway disease or less likely edema. Repeat echocardiogram was performed revealing normal LV systolic function of 46% to 65%. It did improve with one IV dose of Lasix in the emergency room and he was discharged on 20 mg daily, HCTZ was discontinued. He was noted to have mild pulmonary hypertension. Weight on discharge 362 lbs, follow-up weight in primary care provider office 255 lbs.    I have not seen him since 2016 although he was seen recently in our office for evaluation of chest pain.    He had a negative perfusion study in March of this year.  He returns now he is actually been doing well.  He pushes a lawnmower.  He denies any other cardiovascular symptoms.  He has had no new shortness of breath, PND or orthopnea.  He said no palpitations, presyncope or syncope.  He denies chest pressure, neck or arm discomfort.  He continues to eat too much.   Past Medical History:  Diagnosis Date  . Anemia 11/16/2014  . Bladder disease    "an unusual bladder disease; don't know what it's called" (06/30/2015)  . Bulging lumbar disc   . GERD (gastroesophageal reflux disease)   . History of hiatal hernia   . Hyperglycemia 11/16/2014  . Hyperlipemia   . Hypertension   . Joint pain, knee   . Migraine    06/30/2015 "maybe 2-3 times/year; not as bad as I used to have them"  . OSA (obstructive sleep apnea)    "dr took me off the mask; told me to lose weight" (06/30/2015)    Past Surgical History:  Procedure Laterality Date  .  ESOPHAGOGASTRODUODENOSCOPY    . ESOPHAGOGASTRODUODENOSCOPY (EGD) WITH ESOPHAGEAL DILATION  ~ 2014  . KNEE ARTHROSCOPY Right 1995  . NASAL SEPTUM SURGERY  ~ 1972  . TOTAL KNEE ARTHROPLASTY  10/11/2011   Procedure: TOTAL KNEE ARTHROPLASTY;  Surgeon: Rudean Haskell, MD;  Location: [facility];  Service: Orthopedics;  Laterality: Right;  . 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)  ~2014     Current Outpatient Medications  Medication Sig Dispense Refill  . albuterol (PROVENTIL HFA;VENTOLIN HFA) 108 (90 Base) MCG/ACT inhaler Inhale 2 puffs into the lungs every 6 (six) hours as needed for wheezing or shortness of breath. 1 Inhaler 2  . albuterol (PROVENTIL) (2.5 MG/3ML) 0.083% nebulizer solution Take 3 mLs (2.5 mg total) by nebulization every 6 (six) hours as needed for wheezing or shortness of breath. 150 mL 1  . ALPRAZolam (XANAX) 0.5 MG tablet Take 0.5 tablets (0.25 mg total) by mouth 2 (two) times daily as needed for anxiety. 30 tablet 0  . amLODipine-benazepril (LOTREL) 10-40 MG capsule Take 1 capsule by mouth daily. 90 capsule 1  . ARIPiprazole (ABILIFY) 15 MG tablet Take 0.5 tablets (7.5 mg total) by mouth daily. 30 tablet 0  . b complex vitamins tablet Take 1 tablet by mouth daily.    . benzonatate (TESSALON) 100 MG capsule Take  1 capsule (100 mg total) by mouth 3 (three) times daily as needed for cough. 30 capsule 0  . cyclobenzaprine (FLEXERIL) 10 MG tablet Take 1 tablet by mouth as directed.  0  . ferrous sulfate 325 (65 FE) MG tablet Take 1 tablet (325 mg total) by mouth 2 (two) times daily with a meal. 60 tablet 0  . fexofenadine (ALLEGRA) 180 MG tablet Take 1 tablet (180 mg total) by mouth daily. 30 tablet 0  . furosemide (LASIX) 20 MG tablet Take 1 tablet (20 mg total) by mouth daily. 90 tablet 1  . hydrochlorothiazide (HYDRODIURIL) 25 MG tablet TAKE 1 TABLET DAILY 90 tablet 1  . meloxicam (MOBIC) 7.5 MG tablet Take 1 tablet by mouth 2 (two) times daily.  0  .  metoprolol succinate (TOPROL-XL) 50 MG 24 hr tablet Take 1 tablet (50 mg total) by mouth daily. Take with or immediately following a meal.    . Multiple Vitamin (MULTIVITAMIN WITH MINERALS) TABS tablet Take 1 tablet by mouth daily.    . Nebulizers (VIOS AEROSOL DELIVERY SYSTEM) MISC 1 each as directed.  0  . omeprazole (PRILOSEC) 40 MG capsule Take 1 capsule (40 mg total) by mouth daily. 90 capsule 0  . potassium chloride (K-DUR,KLOR-CON) 10 MEQ tablet Take 1 tablet (10 mEq total) by mouth daily. 30 tablet 0  . predniSONE (DELTASONE) 10 MG tablet Take 1 tablet by mouth as directed.  0  . sertraline (ZOLOFT) 100 MG tablet Take 1.5 tablets (150 mg total) by mouth daily. 30 tablet 0  . sildenafil (VIAGRA) 100 MG tablet Take 1 tablet (100 mg total) by mouth daily as needed for erectile dysfunction. 10 tablet 2   No current facility-administered medications for this visit.     Allergies:   Patient has no known allergies.     ROS:  Please see the history of present illness.   Otherwise, review of systems are positive for none.   All other systems are reviewed and negative.    PHYSICAL EXAM: VS:  BP (!) 146/86   Pulse 61   Ht 5\' 7"  (1.702 m)   Wt 258 lb (117 kg)   SpO2 92%   BMI 40.41 kg/m  , BMI Body mass index is 40.41 kg/m. GENERAL:  Well appearing NECK:  No jugular venous distention, waveform within normal limits, carotid upstroke brisk and symmetric, no bruits, no thyromegaly LUNGS:  Clear to auscultation bilaterally CHEST:  Unremarkable HEART:  PMI not displaced or sustained,S1 and S2 within normal limits, no S3, no S4, no clicks, no rubs, no murmurs ABD:  Flat, positive bowel sounds normal in frequency in pitch, no bruits, no rebound, no guarding, no midline pulsatile mass, no hepatomegaly, no splenomegaly EXT:  2 plus pulses throughout, no edema, no cyanosis no clubbing    EKG:  EKG is not ordered today.    Recent Labs: 10/21/2017: B Natriuretic Peptide 122.5 10/22/2017: ALT  76 10/25/2017: Hemoglobin 12.8; Platelets 312 01/13/2018: BUN 22; Creat 1.05; Potassium 3.6; Sodium 143    Lipid Panel    Component Value Date/Time   CHOL 200 06/30/2015 1604   TRIG 91 06/30/2015 1604   HDL 43 06/30/2015 1604   CHOLHDL 4.7 06/30/2015 1604   VLDL 18 06/30/2015 1604   LDLCALC 139 (H) 06/30/2015 1604      Wt Readings from Last 3 Encounters:  03/07/18 258 lb (117 kg)  02/20/18 261 lb (118.4 kg)  02/03/18 256 lb 6.4 oz (116.3 kg)  Other studies Reviewed: Additional studies/ records that were reviewed today include: None. Review of the above records demonstrates:  Please see elsewhere in the note.     ASSESSMENT AND PLAN:  CHEST PAIN:  He has no chest pain and no new SOB.  No further work up is indicated.  He needs continued risk reudction.     OSA:  He is now using his sleep mask.   He needs weight loss.   HTN:  The blood pressure is at target. No change in medications is indicated. We will continue with therapeutic lifestyle changes (TLC).  HYPERLIPIDEMIA:  He does not have diabetes.  I do not want to contribute to polypharmacy.  He needs lifestyle and diet changes.      OBESITY:   We talked at length about diet today.  Current medicines are reviewed at length with the patient today.  The patient does not have concerns regarding medicines.  The following changes have been made:  no change  Labs/ tests ordered today include: None No orders of the defined types were placed in this encounter.    Disposition:   FU with me as needed.     Signed, Minus Breeding, MD  03/07/2018 5:07 PM    [place]

## 2018-03-07 ENCOUNTER — Ambulatory Visit: Payer: BLUE CROSS/BLUE SHIELD | Admitting: Cardiology

## 2018-03-07 ENCOUNTER — Encounter: Payer: Self-pay | Admitting: Cardiology

## 2018-03-07 VITALS — BP 146/86 | HR 61 | Ht 67.0 in | Wt 258.0 lb

## 2018-03-07 DIAGNOSIS — I10 Essential (primary) hypertension: Secondary | ICD-10-CM

## 2018-03-07 DIAGNOSIS — R0602 Shortness of breath: Secondary | ICD-10-CM | POA: Diagnosis not present

## 2018-03-07 NOTE — Patient Instructions (Signed)
Medication Instructions:  Continue current medications  If you need a refill on your cardiac medications before your next appointment, please call your pharmacy.  Labwork: None Ordered  Testing/Procedures: None Ordered  Follow-Up: Your physician wants you to follow-up in: As Needed.      Thank you for choosing [facility]!!       

## 2018-03-25 DIAGNOSIS — G4733 Obstructive sleep apnea (adult) (pediatric): Secondary | ICD-10-CM | POA: Diagnosis not present

## 2018-04-04 ENCOUNTER — Encounter: Payer: Self-pay | Admitting: Family

## 2018-04-04 ENCOUNTER — Ambulatory Visit: Payer: BLUE CROSS/BLUE SHIELD | Admitting: Family

## 2018-04-04 VITALS — BP 129/70 | HR 60 | Temp 98.5°F | Resp 16 | Ht 67.0 in | Wt 254.0 lb

## 2018-04-04 DIAGNOSIS — M545 Low back pain: Secondary | ICD-10-CM | POA: Diagnosis not present

## 2018-04-04 MED ORDER — MELOXICAM 7.5 MG PO TABS: 7.5000 mg | ORAL_TABLET | Freq: Every day | ORAL | 0 refills | Status: DC

## 2018-04-04 NOTE — Progress Notes (Signed)
Subjective:    [name], male    DOB: 1956-08-01, 62 y.o.   MRN: [id number]  HPI   Jeremy [name] is a 62 yr old male who presents today with chief complaint of low back pain.   Patient reports that he fell at work on 01/25/18. Reports that he tripped and fell on his lip hip. He had immediate pain following the fall.  He saw the ?NP at his company and was advised on supportive measures.   Reports that he was seen 7/12 and released to return to work on full duty on 03/03/18.  He was on light duty until then.  Reports that he was taken out of work since last Thursday.  Today he reports lower mid back pain.  Pain will sometimes radiate down the side of the right leg or the left leg.  Xray of the lumbar spine noted : There is moderately severe disc space narrowing at L2-3. There is mild disc space narrowing at L3-4. Other disc spaces appear unremarkable. There are anterior osteophytes at L3, L4, and L5. There is facet osteoarthritic change at L5-S1 bilaterally.  He had Hip xray of the left hip which was negative.    He is using ibuprofen which works better than acetaminophen.    Lab Results  Component Value Date   CREATININE 1.05 01/13/2018       Review of Systems    see HPI  Past Medical History:  Diagnosis Date  . Anemia 11/16/2014  . Bladder disease    "an unusual bladder disease; don't know what it's called" (06/30/2015)  . Bulging lumbar disc   . GERD (gastroesophageal reflux disease)   . History of hiatal hernia   . Hyperglycemia 11/16/2014  . Hyperlipemia   . Hypertension   . Joint pain, knee   . Migraine    06/30/2015 "maybe 2-3 times/year; not as bad as I used to have them"  . OSA (obstructive sleep apnea)      Social History   Socioeconomic History  . Marital status: Married    Spouse name: Not on file  . Number of children: 3  . Years of education: Not on file  . Highest education level: Not on file  Occupational History  . Not on file    Social Needs  . Financial resource strain: Not on file  . Food insecurity:    Worry: Not on file    Inability: Not on file  . Transportation needs:    Medical: Not on file    Non-medical: Not on file  Tobacco Use  . Smoking status: Former Smoker    Types: Cigarettes  . Smokeless tobacco: Never Used  . Tobacco comment: "stopped smoking in ~ 1990"  Substance and Sexual Activity  . Alcohol use: No    Alcohol/week: 0.0 standard drinks  . Drug use: No  . Sexual activity: Yes  Lifestyle  . Physical activity:    Days per week: Not on file    Minutes per session: Not on file  . Stress: Not on file  Relationships  . Social connections:    Talks on phone: Not on file    Gets together: Not on file    Attends religious service: Not on file    Active member of club or organization: Not on file    Attends meetings of clubs or organizations: Not on file    Relationship status: Not on file  . Intimate partner violence:  Fear of current or ex partner: Not on file    Emotionally abused: Not on file    Physically abused: Not on file    Forced sexual activity: Not on file  Other Topics Concern  . 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 ESOPHAGOGASTRODUODENOSCOPY    . ESOPHAGOGASTRODUODENOSCOPY (EGD) WITH ESOPHAGEAL DILATION  ~ 2014  . KNEE ARTHROSCOPY Right 1995  . NASAL SEPTUM SURGERY  ~ 1972  . TOTAL KNEE ARTHROPLASTY  10/11/2011   Procedure: TOTAL KNEE ARTHROPLASTY;  Surgeon: Rudean Haskell, MD;  Location: [facility];  Service: Orthopedics;  Laterality: Right;  . 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 Stroke Mother 64       Died of CVA  . Hypertension Mother   . Anesthesia problems Neg Hx   . Hypotension Neg Hx   .  Malignant hyperthermia Neg Hx   . Pseudochol deficiency Neg Hx     No Known Allergies  Current Outpatient Medications on File Prior to Visit  Medication Sig Dispense Refill  . albuterol (PROVENTIL HFA;VENTOLIN HFA) 108 (90 Base) MCG/ACT inhaler Inhale 2 puffs into the lungs every 6 (six) hours as needed for wheezing or shortness of breath. 1 Inhaler 2  . albuterol (PROVENTIL) (2.5 MG/3ML) 0.083% nebulizer solution Take 3 mLs (2.5 mg total) by nebulization every 6 (six) hours as needed for wheezing or shortness of breath. 150 mL 1  . ALPRAZolam (XANAX) 0.5 MG tablet Take 0.5 tablets (0.25 mg total) by mouth 2 (two) times daily as needed for anxiety. 30 tablet 0  . amLODipine-benazepril (LOTREL) 10-40 MG capsule Take 1 capsule by mouth daily. 90 capsule 1  . ARIPiprazole (ABILIFY) 15 MG tablet Take 0.5 tablets (7.5 mg total) by mouth daily. 30 tablet 0  . b complex vitamins tablet Take 1 tablet by mouth daily.    . benzonatate (TESSALON) 100 MG capsule Take 1 capsule (100 mg total) by mouth 3 (three) times daily as needed for cough. 30 capsule 0  . cyclobenzaprine (FLEXERIL) 10 MG tablet Take 1 tablet by mouth as directed.  0  . ferrous sulfate 325 (65 FE) MG tablet Take 1 tablet (325 mg total) by mouth 2 (two) times daily with a meal. 60 tablet 0  . fexofenadine (ALLEGRA) 180 MG tablet Take 1 tablet (180 mg total) by mouth daily. 30 tablet 0  . furosemide (LASIX) 20 MG tablet Take 1 tablet (20 mg total) by mouth daily. 90 tablet 1  . hydrochlorothiazide (HYDRODIURIL) 25 MG tablet TAKE 1 TABLET DAILY 90 tablet 1  . meloxicam (MOBIC) 7.5 MG tablet Take 1 tablet by mouth 2 (two) times daily.  0  . metoprolol succinate (TOPROL-XL) 50 MG 24 hr tablet Take 1 tablet (50 mg total) by mouth daily. Take with or immediately following a meal.    . Multiple Vitamin (MULTIVITAMIN WITH MINERALS) TABS tablet Take 1 tablet by mouth daily.    . Nebulizers (VIOS AEROSOL DELIVERY SYSTEM) MISC 1 each as directed.   0  . omeprazole (PRILOSEC) 40 MG capsule Take 1 capsule (40 mg total) by mouth daily. 90 capsule 0  . potassium chloride (K-DUR,KLOR-CON) 10 MEQ tablet Take 1  tablet (10 mEq total) by mouth daily. 30 tablet 0  . predniSONE (DELTASONE) 10 MG tablet Take 1 tablet by mouth as directed.  0  . sertraline (ZOLOFT) 100 MG tablet Take 1.5 tablets (150 mg total) by mouth daily. 30 tablet 0  . sildenafil (VIAGRA) 100 MG tablet Take 1 tablet (100 mg total) by mouth daily as needed for erectile dysfunction. 10 tablet 2   No current facility-administered medications on file prior to visit.     BP 129/70 (BP Location: Right Arm, Patient Position: Sitting, Cuff Size: Large)   Pulse 60   Temp 98.5 F (36.9 C) (Oral)   Resp 16   Ht 5\' 7"  (1.702 m)   Wt 254 lb (115.2 kg)   SpO2 96%   BMI 39.78 kg/m    Objective:   Physical Exam  Constitutional: He is oriented to person, place, and time. He appears well-developed and well-nourished. No distress.  HENT:  Head: Normocephalic and atraumatic.  Cardiovascular: Normal rate and regular rhythm.  No murmur heard. Pulmonary/Chest: Effort normal and breath sounds normal. No respiratory distress. He has no wheezes. He has no rales.  Musculoskeletal: He exhibits no edema.       Lumbar back: He exhibits no tenderness.  Neurological: He is alert and oriented to person, place, and time.  Reflex Scores:      Patellar reflexes are 2+ on the right side and 2+ on the left side. Bilateral LE strength is 5/5  Skin: Skin is warm and dry.  Psychiatric: He has a normal mood and affect. His behavior is normal. Thought content normal.          Assessment & Plan:  Low back pain- rx with meloxicam, d/c ibuprofen. Refer for PT. Advised pt OK to use tylenol prn.  Follow up in 2 weeks. Remain out of work until re-evaluation. Letter provided for employer.

## 2018-04-04 NOTE — Patient Instructions (Signed)
Please begin meloxicam once daily for back pain. You should be contacted about beginning physical therapy.

## 2018-04-07 ENCOUNTER — Telehealth: Payer: Self-pay | Admitting: Family

## 2018-04-07 NOTE — Telephone Encounter (Signed)
Copied from [place] [id number]. Topic: Quick Communication - See Telephone Encounter >> Apr 07, 2018 10:31 AM Rosalin Hawking wrote: CRM for notification. See Telephone encounter for: 04/07/18.   Pt dropped off document to be filled out by provider ([place]- in a big white envelope) Pt would like document to be faxed to [phone] when ready. Document put at front office tray under providers name.

## 2018-04-12 ENCOUNTER — Ambulatory Visit: Payer: BLUE CROSS/BLUE SHIELD | Attending: Family | Admitting: Physical Therapy

## 2018-04-12 ENCOUNTER — Telehealth: Payer: Self-pay | Admitting: Family

## 2018-04-12 ENCOUNTER — Encounter: Payer: Self-pay | Admitting: Physical Therapy

## 2018-04-12 ENCOUNTER — Other Ambulatory Visit: Payer: Self-pay

## 2018-04-12 DIAGNOSIS — M62830 Muscle spasm of back: Secondary | ICD-10-CM | POA: Diagnosis not present

## 2018-04-12 DIAGNOSIS — M5442 Lumbago with sciatica, left side: Secondary | ICD-10-CM | POA: Insufficient documentation

## 2018-04-12 DIAGNOSIS — M62838 Other muscle spasm: Secondary | ICD-10-CM | POA: Insufficient documentation

## 2018-04-12 DIAGNOSIS — R293 Abnormal posture: Secondary | ICD-10-CM | POA: Diagnosis not present

## 2018-04-12 NOTE — Therapy (Signed)
[facility] [address]  [place], [place], [address] Phone: [phone]   Fax:  [phone]  Physical Therapy Evaluation  Patient Details  Name: Jeremy [name] MRN: [id number] Date of Birth: 01/20/1956 Referring Provider: [name], NP   Encounter Date: 04/12/2018  PT End of Session - 04/12/18 0930    Visit Number  1    Number of Visits  12    Date for PT Re-Evaluation  05/24/18    Authorization Type  [employer]    Authorization - Number of Visits  50    PT Start Time  0930    PT Stop Time  1033    PT Time Calculation (min)  63 min    Activity Tolerance  Patient tolerated treatment well    Behavior During Therapy  [facility] for tasks assessed/performed       Past Medical History:  Diagnosis Date  . Anemia 11/16/2014  . Bladder disease    "an unusual bladder disease; don't know what it's called" (06/30/2015)  . Bulging lumbar disc   . GERD (gastroesophageal reflux disease)   . History of hiatal hernia   . Hyperglycemia 11/16/2014  . Hyperlipemia   . Hypertension   . Joint pain, knee   . Migraine    06/30/2015 "maybe 2-3 times/year; not as bad as I used to have them"  . OSA (obstructive sleep apnea)     Past Surgical History:  Procedure Laterality Date  . ESOPHAGOGASTRODUODENOSCOPY    . ESOPHAGOGASTRODUODENOSCOPY (EGD) WITH ESOPHAGEAL DILATION  ~ 2014  . KNEE ARTHROSCOPY Right 1995  . NASAL SEPTUM SURGERY  ~ 1972  . TOTAL KNEE ARTHROPLASTY  10/11/2011   Procedure: TOTAL KNEE ARTHROPLASTY;  Surgeon: Rudean Haskell, MD;  Location: [facility];  Service: Orthopedics;  Laterality: Right;  . 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)  ~2014    There were no vitals filed for this visit.   Subjective Assessment - 04/12/18 0934    Subjective  Pt reports he hurt his back in June after a fall at work landing on his L hip on 01/25/18. Was pulled out of work to light duty for a while, went back to  full duty as of 03/03/18 but was still limited by pain, therefore currently out of work. 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    Limitations  Sitting;Standing;House hold activities;Lifting    How long can you sit comfortably?  30 minutes    How long can you stand comfortably?  30 minutes    Diagnostic tests  Lumbar x-ray 03/03/18 noted: Xray of the lumbar spine noted : There is moderately severe disc space narrowing at L2-3. There is mild disc space narrowing at L3-4. Other disc spaces appear unremarkable. There are anterior osteophytes at L3, L4, and L5. There is facet osteoarthritic change at L5-S1 bilaterally.    Patient Stated Goals  "painless so I can get back to work"    Currently in Pain?  Yes    Pain Score  4     Pain Location  Back    Pain Orientation  Lower;Right;Left    Pain Descriptors / Indicators  Dull;Aching    Pain Type  Acute pain    Pain Radiating Towards  intermittent pain, numbness & tingling down L LE (less common recently)    Pain Onset  More than a month ago   June 2019   Pain  Frequency  Intermittent    Aggravating Factors   bending over    Pain Relieving Factors  rest, pain meds & muscle relaxants    Effect of Pain on Daily Activities  Avoids bending over         [facility] PT Assessment - 04/12/18 0930      Assessment   Medical Diagnosis  Acute B low back with intermittent L sciatica    Referring Provider  [name], NP    Onset Date/Surgical Date  01/25/18    Next MD Visit  04/18/18    Prior Therapy  PT for low back in 2016 and s/p TKR in 2013      Balance Screen   Has the patient fallen in the past 6 months  Yes    How many times?  1    Has the patient had a decrease in activity level because of a fear of falling?   Yes    Is the patient reluctant to leave their home because of a fear of falling?   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    Leisure  workout 3-5 days/wk (cardio & lifting) prior to back injury      Observation/Other Assessments   Focus on Therapeutic Outcomes (FOTO)   Lumbar 46% (54% limitation); predicted 58% (42% limitation) in 11 visits      ROM / Strength   AROM / PROM / Strength  AROM;Strength      AROM   AROM Assessment Site  Lumbar    Lumbar Flexion  hands to ankles + LBP    Lumbar Extension  50% limited + LBP    Lumbar - Right Side Bend  hand to 2" above knee + L LBP    Lumbar - Left Side Bend  hand to lateral joint line of knee - no pain    Lumbar - Right Rotation  25% limited - no pain    Lumbar - Left Rotation  50% limited + L LBP      Strength   Strength Assessment Site  Hip;Knee    Right/Left Hip  Right;Left    Right Hip Flexion  4+/5    Right Hip Extension  4/5    Right Hip External Rotation   4/5    Right Hip Internal Rotation  4+/5   limited motion   Right Hip ABduction  4+/5    Right Hip ADduction  4/5    Left Hip Flexion  4+/5    Left Hip Extension  4/5    Left Hip External Rotation  4/5    Left Hip Internal Rotation  4+/5   limited motion   Left Hip ABduction  4/5    Left Hip ADduction  4/5    Right/Left Knee  Right;Left    Right Knee Flexion  5/5    Right Knee Extension  5/5    Left Knee Flexion  5/5    Left Knee Extension  5/5  Flexibility   Soft Tissue Assessment /Muscle Length  yes    Hamstrings  mild/mod tightness B    Quadriceps  mod tight L, severe tight on R    Piriformis  mild tightness B      Palpation   Palpation comment  ttp over L lumbar paraspinals, glutes & piriformis                Objective measurements completed on  examination: See above findings.      [facility] PT Treatment/Exercise - 04/12/18 0930      Exercises   Exercises  Lumbar      Lumbar Exercises: Stretches   Single Knee to Chest Stretch  Left;Right;30 seconds;1 rep    Prone on Elbows Stretch Limitations  Pt noting benefit from POE positioning at home - Provided instruction McKenzie extension progression for HEP      Lumbar Exercises: Supine   Bridge  10 reps;3 seconds    Bridge Limitations  cues for slower pace and increased hold time      Modalities   Modalities  Electrical Stimulation;Moist Heat      Moist Heat Therapy   Number Minutes Moist Heat  15 Minutes    Moist Heat Location  Lumbar Spine      Electrical Stimulation   Electrical Stimulation Location  Lumbar paraspinals    Electrical Stimulation Action  IFC    Electrical Stimulation Parameters  80-150 Hz, intensity to pt tol x 15'    Electrical Stimulation Goals  Pain;Tone             PT Education - 04/12/18 1033    Education Details  PT eval findings, anticipated POC & initial HEP    Person(s) Educated  Patient    Methods  Explanation;Demonstration;Handout    Comprehension  Verbalized understanding;Returned demonstration       PT Short Term Goals - 04/12/18 1033      PT SHORT TERM GOAL #1   Title  Independent with initial HEP    Status  New    Target Date  04/26/18      PT SHORT TERM GOAL #2   Title  Pt will verbalize/demonstrate understanding of neutral spine and proper body mechanics to prevent/reduce LBP with daily tasks    Status  New    Target Date  05/03/18        PT Long Term Goals - 04/12/18 1033      PT LONG TERM GOAL #1   Title  independent with ongoing/advanced HEP     Status  New    Target Date  05/24/18      PT LONG TERM GOAL #2   Title  Verbalize understanding of proper posture/body mechanics with work-simualted tasks to reduce risk of reinjury     Status  New    Target Date  05/24/18      PT LONG TERM GOAL #3   Title   Report ability to perform work tasks without increase in LBP    Status  New    Target Date  05/24/18      PT LONG TERM GOAL #4   Title  Proximal LE strength >/= 4+/5 for improved core stability    Status  New    Target Date  05/24/18             Plan - 04/12/18 1033    Clinical Impression Statement  Marvyn is a 62 y/o male who presents to OP PT for acute  low back pain with intermittent sciatica on L originating after a fall at work on 01/25/18 where he tripped landing on his L hip. Currently out of work after being unable to return to full duty due to LBP following ~1 month on light duty. 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 Pt reporting intermittent pain, numbness and tingling down L LE to foot, but states this is less so now that initially after injury. Lumbar ROM mildly restricted with pain noted in flexion, extension (to a lesser degree), R side bending & L rotation. Mild proximal LE weakness noted as well as limitations in proximal LE flexibility and mild core/proximal LE weakness. Increased ttp and muscle tension/spasms noted in L lumbar paraspinals as well as L glutes and piriformis. Pt will benefit from skilled PT to address deficits listed and allow return to normal activity level and job performance w/o pain interference.    History and Personal Factors relevant to plan of care:  Prior h/o PT for LBP due to lumbar DDD in 2016; R TKR; obesity    Clinical Presentation  Stable    Clinical Decision Making  Low    Rehab Potential  Good    PT Treatment/Interventions  Patient/family education;Neuromuscular re-education;Therapeutic exercise;Therapeutic activities;Functional mobility training;ADLs/Self Care Home Management;Moist Heat;Electrical Stimulation;Iontophoresis 4mg /ml Dexamethasone;Ultrasound;Traction;Manual techniques;Dry needling;Taping    Consulted and  Agree with Plan of Care  Patient       Patient will benefit from skilled therapeutic intervention in order to improve the following deficits and impairments:  Pain, Increased muscle spasms, Impaired flexibility, Decreased range of motion, Decreased strength, Decreased activity tolerance, Difficulty walking, Postural dysfunction, Improper body mechanics  Visit Diagnosis: Acute bilateral low back pain with left-sided sciatica  Muscle spasm of back  Other muscle spasm  Abnormal posture     Problem List Patient Active Problem List   Diagnosis Date Noted  . Morbid obesity ([place]) 03/07/2018  . Dyspnea 10/22/2017  . Acute on chronic diastolic CHF (congestive heart failure) ([place])   . Bronchospasm, acute 10/21/2017  . Hyperlipidemia   . Chronic diastolic heart failure ([place])   . Atypical chest pain 06/30/2015  . OSA (obstructive sleep apnea) 02/10/2015  . GERD (gastroesophageal reflux disease) 02/10/2015  . Anxiety and depression 02/10/2015  . Anemia 11/16/2014  . Preventative health care 11/16/2014  . Hyperglycemia 11/16/2014  . HTN (hypertension) 10/10/2014  . Allergic rhinitis 10/10/2014    Percival Spanish, PT, MPT 04/12/2018, 3:08 PM  [facility] [address]  [place], [place], [address] Phone: [phone]   Fax:  [phone]  Name: YEE JOSS MRN: [id number] Date of Birth: 16-Sep-1955

## 2018-04-12 NOTE — Telephone Encounter (Signed)
Copied from [place] [id number]. Topic: Quick Communication - See Telephone Encounter >> Apr 12, 2018  8:54 AM Rosalin Hawking wrote: CRM for notification. See Telephone encounter for: 04/12/18.   Pt came in office and dropped off documents for provider to fill out ( Disability Insurance 5 pages) Pt would like document to be faxed to [phone] when documents ready. Document put at front office tray under providers name.

## 2018-04-12 NOTE — Telephone Encounter (Signed)
Patirnt has been made aware that his provider is out of the office until Monday, 04/17/18; we will have the paperwork completed as much as possible and forwarded to provider upon RTO and if he needs an extension to please call his agent via [employer] and make them aware. Patient understood & agreed/SLS 08/21

## 2018-04-14 ENCOUNTER — Ambulatory Visit: Payer: BLUE CROSS/BLUE SHIELD | Admitting: Nurse Practitioner

## 2018-04-14 ENCOUNTER — Telehealth: Payer: Self-pay | Admitting: General Surgery

## 2018-04-14 ENCOUNTER — Encounter: Payer: Self-pay | Admitting: Nurse Practitioner

## 2018-04-14 VITALS — BP 128/70 | HR 66 | Ht 67.0 in | Wt 247.8 lb

## 2018-04-14 DIAGNOSIS — J309 Allergic rhinitis, unspecified: Secondary | ICD-10-CM

## 2018-04-14 DIAGNOSIS — G4733 Obstructive sleep apnea (adult) (pediatric): Secondary | ICD-10-CM

## 2018-04-14 NOTE — Assessment & Plan Note (Signed)
Continue flonase 

## 2018-04-14 NOTE — Telephone Encounter (Signed)
Received ADA Work Engineer, materials Form with FMLA/STD paperwork attached as well but only the FMLA/STD forms received on 04/12/18 were completed and signed by the patient  In his section & on the Medical Release, attached last disability forms completed in 04/19 and also last OV notes; forwarded to provider with office note/SLS 08/23

## 2018-04-14 NOTE — Assessment & Plan Note (Signed)
Patient Instructions  Wear CPAP nightly Will change CPAP settings to Autoset 10-18 cmH2O Goal of at least 4 hours Work on healthy weight Do not drive while drowsy 3 month follow up with Dr. Elsworth Soho

## 2018-04-14 NOTE — Patient Instructions (Addendum)
Wear CPAP nightly Will change CPAP settings to Autoset 10-18 cmH2O Goal of at least 4 hours Work on healthy weight Do not drive while drowsy 3 month follow up with Dr. Elsworth Soho

## 2018-04-14 NOTE — Progress Notes (Signed)
@Patient  ID: Jeremy [name], male    DOB: 11-Jul-1956, 62 y.o.   MRN: [id number]  Chief Complaint  Patient presents with  . Follow-up    2 month    Referring provider: [name], NP  HPI  62 year old male with sleep apnea and allergic rhinitis followed by Dr. Elsworth Soho.Health history includes Obesity, HTN, CHF.   Tests: NPSG 11/2017 showed severe OSA with AHI 45/hour with lowest desaturation of 71%.    OV 04-14-18 Follow up new CPAP and allergic rhinitis  Patient presents for a follow up on OSA and allergic rhinitis. He has a new CPAP machine that he has been using for a little over a month now. He states that he is complaint and wears the CPAP every night. He feels that he benefits from wearing the CPAP and feels better when he uses it. Denies any shortness of breath.   He states that his allergic rhinitis has been well controlled. No current issues. Denies any fever or cough.   CPAP compliance report 03-14-18 - 04-12-18 Usage days 30/30 days, average usage 6 hours 42 minutes, CPAP auto set 5-20 cmH2O. AHI:11.1.   Note: will adjust pressure to 10-18 cmH2O   No Known Allergies  Immunization History  Administered Date(s) Administered  . Influenza,inj,Quad PF,6+ Mos 05/03/2016, 05/16/2017  . Influenza-Unspecified 05/23/2014, 05/07/2015, 05/19/2015  . Tdap 11/11/2014    Past Medical History:  Diagnosis Date  . Anemia 11/16/2014  . Bladder disease    "an unusual bladder disease; don't know what it's called" (06/30/2015)  . Bulging lumbar disc   . GERD (gastroesophageal reflux disease)   . History of hiatal hernia   . Hyperglycemia 11/16/2014  . Hyperlipemia   . Hypertension   . Joint pain, knee   . Migraine    06/30/2015 "maybe 2-3 times/year; not as bad as I used to have them"  . OSA (obstructive sleep apnea)     Tobacco History: Social History   Tobacco Use  Smoking Status Former Smoker  . Types: Cigarettes  Smokeless Tobacco Never Used  Tobacco Comment   "stopped smoking in ~ 1990"   Counseling given: Yes Comment: "stopped smoking in ~ 1990"   Outpatient Encounter Medications as of 04/14/2018  Medication Sig  . albuterol (PROVENTIL HFA;VENTOLIN HFA) 108 (90 Base) MCG/ACT inhaler Inhale 2 puffs into the lungs every 6 (six) hours as needed for wheezing or shortness of breath.  Marland Kitchen albuterol (PROVENTIL) (2.5 MG/3ML) 0.083% nebulizer solution Take 3 mLs (2.5 mg total) by nebulization every 6 (six) hours as needed for wheezing or shortness of breath.  . ALPRAZolam (XANAX) 0.5 MG tablet Take 0.5 tablets (0.25 mg total) by mouth 2 (two) times daily as needed for anxiety.  Marland Kitchen amLODipine-benazepril (LOTREL) 10-40 MG capsule Take 1 capsule by mouth daily.  . ARIPiprazole (ABILIFY) 15 MG tablet Take 0.5 tablets (7.5 mg total) by mouth daily.  Marland Kitchen b complex vitamins tablet Take 1 tablet by mouth daily.  . benzonatate (TESSALON) 100 MG capsule Take 1 capsule (100 mg total) by mouth 3 (three) times daily as needed for cough.  . cyclobenzaprine (FLEXERIL) 10 MG tablet Take 1 tablet by mouth as directed.  . ferrous sulfate 325 (65 FE) MG tablet Take 1 tablet (325 mg total) by mouth 2 (two) times daily with a meal.  . fexofenadine (ALLEGRA) 180 MG tablet Take 1 tablet (180 mg total) by mouth daily.  . furosemide (LASIX) 20 MG tablet Take 1 tablet (20 mg total) by  mouth daily.  . hydrochlorothiazide (HYDRODIURIL) 25 MG tablet TAKE 1 TABLET DAILY  . meloxicam (MOBIC) 7.5 MG tablet Take 1 tablet (7.5 mg total) by mouth daily.  . metoprolol succinate (TOPROL-XL) 50 MG 24 hr tablet Take 1 tablet (50 mg total) by mouth daily. Take with or immediately following a meal.  . Multiple Vitamin (MULTIVITAMIN WITH MINERALS) TABS tablet Take 1 tablet by mouth daily.  . Nebulizers (VIOS AEROSOL DELIVERY SYSTEM) MISC 1 each as directed.  Marland Kitchen omeprazole (PRILOSEC) 40 MG capsule Take 1 capsule (40 mg total) by mouth daily.  . potassium chloride (K-DUR,KLOR-CON) 10 MEQ tablet Take 1  tablet (10 mEq total) by mouth daily.  . sertraline (ZOLOFT) 100 MG tablet Take 1.5 tablets (150 mg total) by mouth daily.  . sildenafil (VIAGRA) 100 MG tablet Take 1 tablet (100 mg total) by mouth daily as needed for erectile dysfunction.   No facility-administered encounter medications on file as of 04/14/2018.      Review of Systems  Review of Systems  Constitutional: Negative.   HENT: Negative.   Respiratory: Negative for cough and shortness of breath.   Cardiovascular: Negative.   Gastrointestinal: Negative.   Allergic/Immunologic: Negative.   Neurological: Negative.   Psychiatric/Behavioral: Negative.        Physical Exam  BP 128/70 (BP Location: Left Arm, Patient Position: Sitting, Cuff Size: Normal)   Pulse 66   Ht 5\' 7"  (1.702 m)   Wt 247 lb 12.8 oz (112.4 kg)   SpO2 92%   BMI 38.81 kg/m   Wt Readings from Last 5 Encounters:  04/14/18 247 lb 12.8 oz (112.4 kg)  04/04/18 254 lb (115.2 kg)  03/07/18 258 lb (117 kg)  02/20/18 261 lb (118.4 kg)  02/03/18 256 lb 6.4 oz (116.3 kg)     Physical Exam  Constitutional: He is oriented to person, place, and time. He appears well-developed and well-nourished. No distress.  HENT:  MP class 3 airway  Cardiovascular: Normal rate and regular rhythm.  Pulmonary/Chest: Effort normal and breath sounds normal.  Neurological: He is alert and oriented to person, place, and time.  Skin: Skin is warm and dry.  Psychiatric: He has a normal mood and affect.  Nursing note and vitals reviewed.    Assessment & Plan:   OSA (obstructive sleep apnea) Patient Instructions  Wear CPAP nightly Will change CPAP settings to Autoset 10-18 cmH2O Goal of at least 4 hours Work on healthy weight Do not drive while drowsy 3 month follow up with Dr. Elsworth Soho    Allergic rhinitis Continue flonase     Fenton Foy, NP 04/17/2018

## 2018-04-17 ENCOUNTER — Ambulatory Visit: Payer: BLUE CROSS/BLUE SHIELD

## 2018-04-17 ENCOUNTER — Encounter: Payer: Self-pay | Admitting: Nurse Practitioner

## 2018-04-18 ENCOUNTER — Encounter: Payer: Self-pay | Admitting: Family

## 2018-04-18 ENCOUNTER — Telehealth: Payer: Self-pay | Admitting: Family

## 2018-04-18 ENCOUNTER — Other Ambulatory Visit: Payer: Self-pay | Admitting: Family

## 2018-04-18 ENCOUNTER — Ambulatory Visit: Payer: BLUE CROSS/BLUE SHIELD | Admitting: Family

## 2018-04-18 ENCOUNTER — Encounter: Payer: Self-pay | Admitting: Physical Therapy

## 2018-04-18 ENCOUNTER — Telehealth: Payer: Self-pay | Admitting: *Deleted

## 2018-04-18 ENCOUNTER — Ambulatory Visit: Payer: BLUE CROSS/BLUE SHIELD | Admitting: Physical Therapy

## 2018-04-18 VITALS — BP 135/74 | HR 58 | Temp 98.4°F | Resp 16 | Ht 67.0 in | Wt 252.3 lb

## 2018-04-18 DIAGNOSIS — R293 Abnormal posture: Secondary | ICD-10-CM

## 2018-04-18 DIAGNOSIS — M545 Low back pain: Secondary | ICD-10-CM | POA: Diagnosis not present

## 2018-04-18 DIAGNOSIS — M62830 Muscle spasm of back: Secondary | ICD-10-CM | POA: Diagnosis not present

## 2018-04-18 DIAGNOSIS — M62838 Other muscle spasm: Secondary | ICD-10-CM | POA: Diagnosis not present

## 2018-04-18 DIAGNOSIS — M5442 Lumbago with sciatica, left side: Secondary | ICD-10-CM

## 2018-04-18 MED ORDER — MELOXICAM 7.5 MG PO TABS: 7.5000 mg | ORAL_TABLET | Freq: Every day | ORAL | 0 refills | Status: DC

## 2018-04-18 NOTE — Telephone Encounter (Signed)
Refill sent to [facility]. Only use PRN.

## 2018-04-18 NOTE — Telephone Encounter (Signed)
Notified pt. 

## 2018-04-18 NOTE — Telephone Encounter (Signed)
Copied from [place] [id number]. Topic: General - Other >> Apr 18, 2018 11:37 AM Jeremy [name] R wrote: Pt is still having pain in his back and wanting to know if his meloxicam (MOBIC) 7.5 MG tablet can be refilled

## 2018-04-18 NOTE — Telephone Encounter (Signed)
Please fax out of work note to:  Please fax to [employer] at:   [phone]

## 2018-04-18 NOTE — Telephone Encounter (Signed)
Note faxed.

## 2018-04-18 NOTE — Progress Notes (Signed)
Subjective:    [name], male    DOB: [birth year], 62 y.o.   MRN: [id number]  HPI  Mr. Mahmood is a 62 yr old male who presents today for follow up of his back pain. This occurred following a fall at work on 01/25/18.  We saw him on 04/04/18 and was given rx for meloxicam and PT referral.  He was written out of work pending his follow up today.  Pt started PT on 8/21.  Notes that his back pain is improving. PT recommends that he remain out of work until 05/24/18.    Pain is non-radiating and is rated 3-4/10. Not worse with movement.   Review of Systems    see HPI  Past Medical History:  Diagnosis Date  . Anemia 11/16/2014  . Bladder disease    "an unusual bladder disease; don't know what it's called" (06/30/2015)  . Bulging lumbar disc   . GERD (gastroesophageal reflux disease)   . History of hiatal hernia   . Hyperglycemia 11/16/2014  . Hyperlipemia   . Hypertension   . Joint pain, knee   . Migraine    06/30/2015 "maybe 2-3 times/year; not as bad as I used to have them"  . OSA (obstructive sleep apnea)      Social History   Socioeconomic History  . Marital status: Married    Spouse name: Not on file  . Number of children: 3  . Years of education: Not on file  . Highest education level: Not on file  Occupational History  . Not on file  Social Needs  . Financial resource strain: Not on file  . Food insecurity:    Worry: Not on file    Inability: Not on file  . Transportation needs:    Medical: Not on file    Non-medical: Not on file  Tobacco Use  . Smoking status: Former Smoker    Types: Cigarettes  . Smokeless tobacco: Never Used  . Tobacco comment: "stopped smoking in ~ 1990"  Substance and Sexual Activity  . Alcohol use: No    Alcohol/week: 0.0 standard drinks  . Drug use: No  . Sexual activity: Yes  Lifestyle  . Physical activity:    Days per week: Not on file    Minutes per session: Not on file  . Stress: Not on file  Relationships  .  Social connections:    Talks on phone: Not on file    Gets together: Not on file    Attends religious service: Not on file    Active member of club or organization: Not on file    Attends meetings of clubs or organizations: Not on file    Relationship status: Not on file  . Intimate partner violence:    Fear of current or ex partner: Not on file    Emotionally abused: Not on file    Physically abused: Not on file    Forced sexual activity: Not on file  Other Topics Concern  . 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 ESOPHAGOGASTRODUODENOSCOPY    . ESOPHAGOGASTRODUODENOSCOPY (EGD) WITH ESOPHAGEAL DILATION  ~ 2014  . KNEE ARTHROSCOPY Right 1995  . NASAL SEPTUM SURGERY  ~ 1972  . TOTAL KNEE ARTHROPLASTY  10/11/2011  Procedure: TOTAL KNEE ARTHROPLASTY;  Surgeon: Rudean Haskell, MD;  Location: [facility];  Service: Orthopedics;  Laterality: Right;  . 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 Stroke Mother 92       Died of CVA  . Hypertension Mother   . Anesthesia problems Neg Hx   . Hypotension Neg Hx   . Malignant hyperthermia Neg Hx   . Pseudochol deficiency Neg Hx     No Known Allergies  Current Outpatient Medications on File Prior to Visit  Medication Sig Dispense Refill  . albuterol (PROVENTIL HFA;VENTOLIN HFA) 108 (90 Base) MCG/ACT inhaler Inhale 2 puffs into the lungs every 6 (six) hours as needed for wheezing or shortness of breath. 1 Inhaler 2  . albuterol (PROVENTIL) (2.5 MG/3ML) 0.083% nebulizer solution Take 3 mLs (2.5 mg total) by nebulization every 6 (six) hours as needed for wheezing or shortness of breath. 150 mL 1  . ALPRAZolam (XANAX) 0.5 MG tablet Take 0.5 tablets (0.25 mg total) by mouth 2 (two) times  daily as needed for anxiety. 30 tablet 0  . amLODipine-benazepril (LOTREL) 10-40 MG capsule Take 1 capsule by mouth daily. 90 capsule 1  . ARIPiprazole (ABILIFY) 15 MG tablet Take 0.5 tablets (7.5 mg total) by mouth daily. 30 tablet 0  . b complex vitamins tablet Take 1 tablet by mouth daily.    . benzonatate (TESSALON) 100 MG capsule Take 1 capsule (100 mg total) by mouth 3 (three) times daily as needed for cough. 30 capsule 0  . cyclobenzaprine (FLEXERIL) 10 MG tablet Take 1 tablet by mouth as directed.  0  . ferrous sulfate 325 (65 FE) MG tablet Take 1 tablet (325 mg total) by mouth 2 (two) times daily with a meal. 60 tablet 0  . fexofenadine (ALLEGRA) 180 MG tablet Take 1 tablet (180 mg total) by mouth daily. 30 tablet 0  . furosemide (LASIX) 20 MG tablet Take 1 tablet (20 mg total) by mouth daily. 90 tablet 1  . hydrochlorothiazide (HYDRODIURIL) 25 MG tablet TAKE 1 TABLET DAILY 90 tablet 1  . meloxicam (MOBIC) 7.5 MG tablet Take 1 tablet (7.5 mg total) by mouth daily. 15 tablet 0  . metoprolol succinate (TOPROL-XL) 50 MG 24 hr tablet Take 1 tablet (50 mg total) by mouth daily. Take with or immediately following a meal.    . Multiple Vitamin (MULTIVITAMIN WITH MINERALS) TABS tablet Take 1 tablet by mouth daily.    . Nebulizers (VIOS AEROSOL DELIVERY SYSTEM) MISC 1 each as directed.  0  . omeprazole (PRILOSEC) 40 MG capsule Take 1 capsule (40 mg total) by mouth daily. 90 capsule 0  . potassium chloride (K-DUR,KLOR-CON) 10 MEQ tablet Take 1 tablet (10 mEq total) by mouth daily. 30 tablet 0  . sertraline (ZOLOFT) 100 MG tablet Take 1.5 tablets (150 mg total) by mouth daily. 30 tablet 0  . sildenafil (VIAGRA) 100 MG tablet Take 1 tablet (100 mg total) by mouth daily as needed for erectile dysfunction. 10 tablet 2   No current facility-administered medications on file prior to visit.     BP 135/74 (BP Location: Right Arm, Patient Position: Sitting, Cuff Size: Large)   Pulse (!) 58   Temp 98.4  F (36.9 C) (Oral)   Resp 16   Ht 5\' 7"  (1.702 m)   Wt 252 lb 4.8 oz (114.4 kg)   SpO2 93%   BMI 39.52 kg/m  Objective:   Physical Exam  Constitutional: He is oriented to person, place, and time. He appears well-developed and well-nourished. No distress.  HENT:  Head: Normocephalic and atraumatic.  Cardiovascular: Normal rate and regular rhythm.  No murmur heard. Pulmonary/Chest: Effort normal and breath sounds normal. No respiratory distress. He has no wheezes. He has no rales.  Musculoskeletal: He exhibits no edema.       Cervical back: He exhibits no tenderness.       Thoracic back: He exhibits no tenderness.  Neurological: He is alert and oriented to person, place, and time.  Reflex Scores:      Patellar reflexes are 3+ on the right side and 3+ on the left side. Bilateral LE strength is 5/5  Skin: Skin is warm and dry.  Psychiatric: He has a normal mood and affect. His behavior is normal. Thought content normal.          Assessment & Plan:  Low back pain- slowly improving. Continue PT.  Note provided to keep him out of work until PT is complete on 05/24/18.  Will fax to [employer].

## 2018-04-18 NOTE — Patient Instructions (Signed)
Continue physical therapy

## 2018-04-18 NOTE — Therapy (Signed)
[facility] [address]  [place], [place], [address] Phone: [phone]   Fax:  [phone]  Physical Therapy Treatment  Patient Details  Name: Jeremy [name] MRN: [id number] Date of Birth: 1956-05-24 Referring Provider: [name], NP   Encounter Date: 04/18/2018  PT End of Session - 04/18/18 0805    Visit Number  2    Number of Visits  12    Date for PT Re-Evaluation  05/24/18    Authorization Type  BCBS    Authorization - Number of Visits  50    PT Start Time  0805    PT Stop Time  0902    PT Time Calculation (min)  57 min    Activity Tolerance  Patient tolerated treatment well    Behavior During Therapy  [facility] for tasks assessed/performed       Past Medical History:  Diagnosis Date  . Anemia 11/16/2014  . Bladder disease    "an unusual bladder disease; don't know what it's called" (06/30/2015)  . Bulging lumbar disc   . GERD (gastroesophageal reflux disease)   . History of hiatal hernia   . Hyperglycemia 11/16/2014  . Hyperlipemia   . Hypertension   . Joint pain, knee   . Migraine    06/30/2015 "maybe 2-3 times/year; not as bad as I used to have them"  . OSA (obstructive sleep apnea)     Past Surgical History:  Procedure Laterality Date  . ESOPHAGOGASTRODUODENOSCOPY    . ESOPHAGOGASTRODUODENOSCOPY (EGD) WITH ESOPHAGEAL DILATION  ~ 2014  . KNEE ARTHROSCOPY Right 1995  . NASAL SEPTUM SURGERY  ~ 1972  . TOTAL KNEE ARTHROPLASTY  10/11/2011   Procedure: TOTAL KNEE ARTHROPLASTY;  Surgeon: Rudean Haskell, MD;  Location: [facility];  Service: Orthopedics;  Laterality: Right;  . 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)  ~2014    There were no vitals filed for this visit.  Subjective Assessment - 04/18/18 0808    Subjective  Pt denies any issues or probelms with initial HEP.    Diagnostic tests  Lumbar x-ray 03/03/18 noted: Xray of the lumbar spine noted : There is moderately  severe disc space narrowing at L2-3. There is mild disc space narrowing at L3-4. Other disc spaces appear unremarkable. There are anterior osteophytes at L3, L4, and L5. There is facet osteoarthritic change at L5-S1 bilaterally.    Patient Stated Goals  "painless so I can get back to work"    Currently in Pain?  Yes    Pain Score  3     Pain Location  Neck    Pain Orientation  Left    Pain Descriptors / Indicators  Nagging    Pain Onset  --   June 2019                      [facility] Adult PT Treatment/Exercise - 04/18/18 0805      Exercises   Exercises  Lumbar      Lumbar Exercises: Stretches   Prone on Elbows Stretch  30 seconds;5 reps    Press Ups  5 reps;10 seconds   2 sets     Lumbar Exercises: Aerobic   Nustep  L4 x 6 min (LE only)      Lumbar Exercises: Supine   Bent Knee Raise  10 reps;3 seconds    Bent Knee Raise Limitations  brace marching with green TB    Bridge  10 reps;3 seconds    Bridge Limitations  + hip ABD isometric with green TB    Other Supine Lumbar Exercises  Hooklying alt hip ABD/ER with green TB x10      Lumbar Exercises: Sidelying   Clam  Left;10 reps;3 seconds      Modalities   Modalities  Electrical Stimulation;Moist Heat      Moist Heat Therapy   Number Minutes Moist Heat  15 Minutes    Moist Heat Location  Lumbar Spine   & L buttocks     Manual Therapy   Manual Therapy  Joint mobilization;Soft tissue mobilization;Myofascial release;Other (comment)    Manual therapy comments  prone    Joint Mobilization  grade 2-3 lumbar CPAs in prone & POE - increased discomfort in prone but good tolerance in POE    Soft tissue mobilization  B lumbar praspinals, L piriformis & glutes    Myofascial Release  MFR to B lumbar paraspinsals; manual TPR to L piriformis & glute minimus    Other Manual Therapy  Instructed pt in self-STM with small (tennis) ball on wall.               PT Short Term Goals - 04/18/18 8453      PT SHORT TERM  GOAL #1   Title  Independent with initial HEP    Status  On-going      PT SHORT TERM GOAL #2   Title  Pt will verbalize/demonstrate understanding of neutral spine and proper body mechanics to prevent/reduce LBP with daily tasks    Status  On-going        PT Long Term Goals - 04/18/18 6468      PT LONG TERM GOAL #1   Title  independent with ongoing/advanced HEP     Status  On-going      PT LONG TERM GOAL #2   Title  Verbalize understanding of proper posture/body mechanics with work-simualted tasks to reduce risk of reinjury     Status  On-going      PT LONG TERM GOAL #3   Title  Report ability to perform work tasks without increase in LBP    Status  On-going      PT LONG TERM GOAL #4   Title  Proximal LE strength >/= 4+/5 for improved core stability    Status  On-going            Plan - 04/18/18 0808    Clinical Impression Statement  [name] noting preference extension based exercises, therefore progressed POE to include prone press-ups and lumbar CPAs in POE with good pt tolerance. Continued increased muscle tension noted in B paraspinals (L>R) and L glutes/piriformis which was addressed with STM and manual TPR with good relief. Treatment concluded with moist heat and estim to promote further muscle relaxation and pain relief - will consider recommending home unit if continued benefit noted.    Rehab Potential  Good    PT Frequency  2x / week    PT Duration  6 weeks    PT Treatment/Interventions  Patient/family education;Neuromuscular re-education;Therapeutic exercise;Therapeutic activities;Functional mobility training;ADLs/Self Care Home Management;Moist Heat;Electrical Stimulation;Iontophoresis 4mg /ml Dexamethasone;Ultrasound;Traction;Manual techniques;Dry needling;Taping    Consulted and Agree with Plan of Care  Patient       Patient will benefit from skilled therapeutic intervention in order to improve the following deficits and impairments:  Pain, Increased muscle  spasms, Impaired flexibility, Decreased range of motion, Decreased strength, Decreased activity tolerance, Difficulty walking, Postural dysfunction, Improper  body mechanics  Visit Diagnosis: Acute bilateral low back pain with left-sided sciatica  Muscle spasm of back  Other muscle spasm  Abnormal posture     Problem List Patient Active Problem List   Diagnosis Date Noted  . Morbid obesity ([place]) 03/07/2018  . Dyspnea 10/22/2017  . Acute on chronic diastolic CHF (congestive heart failure) ([place])   . Bronchospasm, acute 10/21/2017  . Hyperlipidemia   . Chronic diastolic heart failure ([place])   . Atypical chest pain 06/30/2015  . OSA (obstructive sleep apnea) 02/10/2015  . GERD (gastroesophageal reflux disease) 02/10/2015  . Anxiety and depression 02/10/2015  . Anemia 11/16/2014  . Preventative health care 11/16/2014  . Hyperglycemia 11/16/2014  . HTN (hypertension) 10/10/2014  . Allergic rhinitis 10/10/2014    Percival Spanish, PT, MPT 04/18/2018, 10:16 AM  [facility] [address]  [place], [place], [address] Phone: [phone]   Fax:  [phone]  Name: Jeremy [name] MRN: [id number] Date of Birth: 06-Apr-1956

## 2018-04-19 NOTE — Telephone Encounter (Signed)
Call not returned by patient as of time stamp.

## 2018-04-19 NOTE — Telephone Encounter (Signed)
Received refill request for metoprolol XL 100mg . Current med list says 50mg .  Please advise which dose to continue?

## 2018-04-20 ENCOUNTER — Telehealth: Payer: Self-pay | Admitting: Family

## 2018-04-20 ENCOUNTER — Ambulatory Visit: Payer: BLUE CROSS/BLUE SHIELD

## 2018-04-20 DIAGNOSIS — M62838 Other muscle spasm: Secondary | ICD-10-CM

## 2018-04-20 DIAGNOSIS — R293 Abnormal posture: Secondary | ICD-10-CM

## 2018-04-20 DIAGNOSIS — M62830 Muscle spasm of back: Secondary | ICD-10-CM

## 2018-04-20 DIAGNOSIS — M5442 Lumbago with sciatica, left side: Secondary | ICD-10-CM | POA: Diagnosis not present

## 2018-04-20 MED ORDER — METOPROLOL SUCCINATE ER 50 MG PO TB24: 50.0000 mg | ORAL_TABLET | Freq: Every day | ORAL | 3 refills | Status: DC

## 2018-04-20 NOTE — Therapy (Signed)
[facility] [address]  [place], [place], [address] Phone: [phone]   Fax:  [phone]  Physical Therapy Treatment  Patient Details  Name: Jeremy [name] MRN: [id number] Date of Birth: [birth year] Referring Provider: [name], NP   Encounter Date: 04/20/2018  PT End of Session - 04/20/18 0844    Visit Number  3    Number of Visits  12    Date for PT Re-Evaluation  05/24/18    Authorization Type  BCBS    Authorization - Number of Visits  83    PT Start Time  0840    PT Stop Time  0945    PT Time Calculation (min)  65 min    Activity Tolerance  Patient tolerated treatment well    Behavior During Therapy  [facility] for tasks assessed/performed       Past Medical History:  Diagnosis Date  . Anemia 11/16/2014  . Bladder disease    "an unusual bladder disease; don't know what it's called" (06/30/2015)  . Bulging lumbar disc   . GERD (gastroesophageal reflux disease)   . History of hiatal hernia   . Hyperglycemia 11/16/2014  . Hyperlipemia   . Hypertension   . Joint pain, knee   . Migraine    06/30/2015 "maybe 2-3 times/year; not as bad as I used to have them"  . OSA (obstructive sleep apnea)     Past Surgical History:  Procedure Laterality Date  . ESOPHAGOGASTRODUODENOSCOPY    . ESOPHAGOGASTRODUODENOSCOPY (EGD) WITH ESOPHAGEAL DILATION  ~ 2014  . KNEE ARTHROSCOPY Right 1995  . NASAL SEPTUM SURGERY  ~ 1972  . TOTAL KNEE ARTHROPLASTY  10/11/2011   Procedure: TOTAL KNEE ARTHROPLASTY;  Surgeon: Rudean Haskell, MD;  Location: [facility];  Service: Orthopedics;  Laterality: Right;  . 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)  ~2014    There were no vitals filed for this visit.  Subjective Assessment - 04/20/18 0841    Subjective  Pt. noting no issues with HEP.      Diagnostic tests  Lumbar x-ray 03/03/18 noted: Xray of the lumbar spine noted : There is moderately severe disc space  narrowing at L2-3. There is mild disc space narrowing at L3-4. Other disc spaces appear unremarkable. There are anterior osteophytes at L3, L4, and L5. There is facet osteoarthritic change at L5-S1 bilaterally.    Patient Stated Goals  "painless so I can get back to work"    Currently in Pain?  Yes    Pain Score  3     Pain Location  Back    Pain Orientation  Left    Pain Descriptors / Indicators  Aching;Dull    Pain Type  Acute pain    Pain Radiating Towards  None today    Pain Onset  More than a month ago    Pain Frequency  Intermittent    Aggravating Factors   Turning and bending over     Pain Relieving Factors  pain meds    Multiple Pain Sites  No         [facility] PT Assessment - 04/20/18 0844      Assessment   Next MD Visit  10.1.19                   [facility] Adult PT Treatment/Exercise - 04/20/18 0855      Self-Care   Self-Care  Other Self-Care Comments    Other  Self-Care Comments   Instruction in self-tennis ball release to L glutes on wall       Lumbar Exercises: Stretches   Prone on Elbows Stretch  1 rep;30 seconds    ITB Stretch  Left;30 seconds    ITB Stretch Limitations  sidelying + QL with L UE raised overhead resting on table and therapist support for LE      Lumbar Exercises: Aerobic   Nustep  L4 x 6 min (LE only)      Lumbar Exercises: Supine   Ab Set  15 reps;3 seconds    AB Set Limitations  Hooklying with tactilce cueing to ensure proper muscular actrivation     Clam  10 reps;3 seconds    Clam Limitations  green TB at knees     Bridge  3 seconds   x 12 reps    Bridge Limitations  + hip ABD isometric with green TB    Bridge with [employer]  3 seconds;10 reps    Bridge with [employer] Limitations  adduction ball squeeze       Knee/Hip Exercises: Standing   Hip Flexion  Right;Left;10 reps;Knee straight;Stengthening    Hip Flexion Limitations  counter    Cues provided to maintain abdom. bracing    Hip Abduction  Right;Left;10 reps;Knee  straight    Abduction Limitations  counter    Cues provided to maintain abdom. bracing    Hip Extension  Right;Left;10 reps;Knee straight;Stengthening    Extension Limitations  counter    Cues provided to maintain abdom. bracing      Moist Heat Therapy   Number Minutes Moist Heat  15 Minutes    Moist Heat Location  Hip   L superior buttocks      Electrical Stimulation   Electrical Stimulation Location  L superior buttocks     Electrical Stimulation Action  IFC    Electrical Stimulation Parameters  intensity to pt. tolerance, 15'    Electrical Stimulation Goals  Pain;Tone      Manual Therapy   Manual Therapy  Soft tissue mobilization    Manual therapy comments  sidelying with L LE elevated on bolster     Soft tissue mobilization  STM to L superior buttocks, L lumbar paraspinals (no sig. tenderness), L QL (tenderness, thus STM then gentle sidelying stretch)             PT Education - 04/20/18 0943    Education Details  HEP update     Person(s) Educated  Patient    Methods  Explanation;Demonstration;Verbal cues;Handout    Comprehension  Verbalized understanding;Returned demonstration;Verbal cues required;Need further instruction       PT Short Term Goals - 04/18/18 2683      PT SHORT TERM GOAL #1   Title  Independent with initial HEP    Status  On-going      PT SHORT TERM GOAL #2   Title  Pt will verbalize/demonstrate understanding of neutral spine and proper body mechanics to prevent/reduce LBP with daily tasks    Status  On-going        PT Long Term Goals - 04/18/18 4196      PT LONG TERM GOAL #1   Title  independent with ongoing/advanced HEP     Status  On-going      PT LONG TERM GOAL #2   Title  Verbalize understanding of proper posture/body mechanics with work-simualted tasks to reduce risk of reinjury     Status  On-going  PT LONG TERM GOAL #3   Title  Report ability to perform work tasks without increase in LBP    Status  On-going      PT LONG  TERM GOAL #4   Title  Proximal LE strength >/= 4+/5 for improved core stability    Status  On-going            Plan - 04/20/18 0844    Clinical Impression Statement  Tim doing well today noting no issues with HEP.  Tolerated progression of lumbopelvic strengthening in supine and standing well today.  Some remaining L superior buttocks and L QL tightness tightness/tenderness, which was addressed with STM and LE stretching with good relief.  Ended visit with E-stim/moist heat applied to L buttocks to lessen post-exercise soreness and tightness.  HEP updated today and will plan to adjust per pt. tolerance in coming visits.    PT Treatment/Interventions  Patient/family education;Neuromuscular re-education;Therapeutic exercise;Therapeutic activities;Functional mobility training;ADLs/Self Care Home Management;Moist Heat;Electrical Stimulation;Iontophoresis 4mg /ml Dexamethasone;Ultrasound;Traction;Manual techniques;Dry needling;Taping    Consulted and Agree with Plan of Care  Patient       Patient will benefit from skilled therapeutic intervention in order to improve the following deficits and impairments:  Pain, Increased muscle spasms, Impaired flexibility, Decreased range of motion, Decreased strength, Decreased activity tolerance, Difficulty walking, Postural dysfunction, Improper body mechanics  Visit Diagnosis: Acute bilateral low back pain with left-sided sciatica  Muscle spasm of back  Other muscle spasm  Abnormal posture     Problem List Patient Active Problem List   Diagnosis Date Noted  . Morbid obesity ([place]) 03/07/2018  . Dyspnea 10/22/2017  . Acute on chronic diastolic CHF (congestive heart failure) ([place])   . Bronchospasm, acute 10/21/2017  . Hyperlipidemia   . Chronic diastolic heart failure ([place])   . Atypical chest pain 06/30/2015  . OSA (obstructive sleep apnea) 02/10/2015  . GERD (gastroesophageal reflux disease) 02/10/2015  . Anxiety and depression 02/10/2015   . Anemia 11/16/2014  . Preventative health care 11/16/2014  . Hyperglycemia 11/16/2014  . HTN (hypertension) 10/10/2014  . Allergic rhinitis 10/10/2014    Bess Harvest, PTA 04/20/18 9:56 AM   [facility] [address]  [place], [place], [address] Phone: [phone]   Fax:  [phone]  Name: EVRETT HAKIM MRN: [id number] Date of Birth: 1956-02-12

## 2018-04-20 NOTE — Telephone Encounter (Signed)
See my chart message

## 2018-04-25 DIAGNOSIS — G4733 Obstructive sleep apnea (adult) (pediatric): Secondary | ICD-10-CM | POA: Diagnosis not present

## 2018-04-26 ENCOUNTER — Ambulatory Visit: Payer: BLUE CROSS/BLUE SHIELD | Attending: Family | Admitting: Physical Therapy

## 2018-04-26 ENCOUNTER — Encounter: Payer: Self-pay | Admitting: Physical Therapy

## 2018-04-26 DIAGNOSIS — M5442 Lumbago with sciatica, left side: Secondary | ICD-10-CM

## 2018-04-26 DIAGNOSIS — M62838 Other muscle spasm: Secondary | ICD-10-CM | POA: Insufficient documentation

## 2018-04-26 DIAGNOSIS — M62830 Muscle spasm of back: Secondary | ICD-10-CM | POA: Diagnosis not present

## 2018-04-26 DIAGNOSIS — R293 Abnormal posture: Secondary | ICD-10-CM | POA: Insufficient documentation

## 2018-04-26 NOTE — Patient Instructions (Signed)
TENS UNIT  This is helpful for muscle pain and spasm.   Search and Purchase a TENS 7000 2nd edition at [email or web address]  (It should be less than $30)     TENS unit instructions:   Do not shower or bathe with the unit on  Turn the unit off before removing electrodes or batteries  If the electrodes lose stickiness add a drop of water to the electrodes after they are disconnected from the unit and place on plastic sheet. If you continued to have difficulty, call the TENS unit company to purchase more electrodes.  Do not apply lotion on the skin area prior to use. Make sure the skin is clean and dry as this will help prolong the life of the electrodes.  After use, always check skin for unusual red areas, rash or other skin difficulties. If there are any skin problems, does not apply electrodes to the same area.  Never remove the electrodes from the unit by pulling the wires.  Do not use the TENS unit or electrodes other than as directed.  Do not change electrode placement without consulting your therapist or physician.  Keep 2 fingers width between each electrode.

## 2018-04-26 NOTE — Therapy (Signed)
[facility] [address]  [place], [place], [address] Phone: [phone]   Fax:  [phone]  Physical Therapy Treatment  Patient Details  Name: Jeremy [name] MRN: [id number] Date of Birth: 10/27/1955 Referring Provider: Judi Saa, NP   Encounter Date: 04/26/2018  PT End of Session - 04/26/18 0849    Visit Number  4    Number of Visits  12    Date for PT Re-Evaluation  05/24/18    Authorization Type  BCBS    Authorization - Number of Visits  50    PT Start Time  0846    PT Stop Time  0943    PT Time Calculation (min)  57 min    Activity Tolerance  Patient tolerated treatment well    Behavior During Therapy  [facility] for tasks assessed/performed       Past Medical History:  Diagnosis Date  . Anemia 11/16/2014  . Bladder disease    "an unusual bladder disease; don't know what it's called" (06/30/2015)  . Bulging lumbar disc   . GERD (gastroesophageal reflux disease)   . History of hiatal hernia   . Hyperglycemia 11/16/2014  . Hyperlipemia   . Hypertension   . Joint pain, knee   . Migraine    06/30/2015 "maybe 2-3 times/year; not as bad as I used to have them"  . OSA (obstructive sleep apnea)     Past Surgical History:  Procedure Laterality Date  . ESOPHAGOGASTRODUODENOSCOPY    . ESOPHAGOGASTRODUODENOSCOPY (EGD) WITH ESOPHAGEAL DILATION  ~ 2014  . KNEE ARTHROSCOPY Right 1995  . NASAL SEPTUM SURGERY  ~ 1972  . TOTAL KNEE ARTHROPLASTY  10/11/2011   Procedure: TOTAL KNEE ARTHROPLASTY;  Surgeon: Rudean Haskell, MD;  Location: [place];  Service: Orthopedics;  Laterality: Right;  . 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)  ~2014    There were no vitals filed for this visit.  Subjective Assessment - 04/26/18 0850    Subjective  Pt reports he has been completing HEP 2x/day. He hasn't gotten a ball to perform self massage yet. He reports 10% improvement since initiating therapy.  He  is walking on treadmill @3 .31mph, 2 miles, 3x/wk and doing arm machines.     Patient Stated Goals  "painless so I can get back to work"    Currently in Pain?  Yes    Pain Score  3     Pain Location  Hip    Pain Orientation  Left    Pain Descriptors / Indicators  Aching;Dull    Aggravating Factors   bending over    Pain Relieving Factors  electric stim.          [facility] PT Assessment - 04/26/18 0001      Assessment   Medical Diagnosis  Acute B low back with intermittent L sciatica    Referring Provider  Judi Saa, NP    Onset Date/Surgical Date  01/25/18    Next MD Visit  05/23/18    Prior Therapy  PT for low back in 2016 and s/p TKR in 2013       [facility] Adult PT Treatment/Exercise - 04/26/18 0001      Lumbar Exercises: Stretches   Passive Hamstring Stretch  Left;Right;2 reps;30 seconds    Prone on Elbows Stretch  10 seconds;5 reps    ITB Stretch  Left;2 reps;30 seconds    Piriformis Stretch  Left;Right;3 reps;30 seconds  Lumbar Exercises: Aerobic   Nustep  L4 x 7 min (LE only)   PTA present to discuss progress and monitor.      Lumbar Exercises: Supine   Bridge  5 seconds;15 reps      Lumbar Exercises: Sidelying   Clam  Left;10 reps;Right;3 seconds      Moist Heat Therapy   Number Minutes Moist Heat  15 Minutes    Moist Heat Location  Hip   L superior buttocks      Electrical Stimulation   Electrical Stimulation Location  Lt lumbar paraspinals/ Lt superior and lateral glute    Electrical Stimulation Action  IFC    Electrical Stimulation Parameters  to tolerance    Electrical Stimulation Goals  Pain;Tone      Manual Therapy   Soft tissue mobilization  STM and TPR to Lt glute med with contract relax, multiple reps.              PT Education - 04/26/18 0936    Education Details  TENS info.     Person(s) Educated  Patient    Methods  Explanation;Handout    Comprehension  Verbalized understanding       PT Short Term Goals - 04/18/18 0808       PT SHORT TERM GOAL #1   Title  Independent with initial HEP    Status  On-going      PT SHORT TERM GOAL #2   Title  Pt will verbalize/demonstrate understanding of neutral spine and proper body mechanics to prevent/reduce LBP with daily tasks    Status  On-going        PT Long Term Goals - 04/26/18 0917      PT LONG TERM GOAL #1   Title  independent with ongoing/advanced HEP     Period  Weeks    Status  On-going      PT LONG TERM GOAL #2   Title  Verbalize understanding of proper posture/body mechanics with work-simualted tasks to reduce risk of reinjury     Time  6    Period  Weeks    Status  On-going      PT LONG TERM GOAL #3   Title  Report ability to perform work tasks without increase in LBP    Time  6    Period  Weeks    Status  On-going      PT LONG TERM GOAL #4   Title  Proximal LE strength >/= 4+/5 for improved core stability    Status  On-going            Plan - 04/26/18 0918    Clinical Impression Statement  Pt has some point tender trigger points in his Lt glute medius; responded well to TPR to this area. He tolerated all exercises well, with mild increase in Lt hip discomfort with hip abdct  He may benefit from DN to this area in future session.  Pt is progressing well towards goals.     Rehab Potential  Good    PT Frequency  2x / week    PT Duration  6 weeks    PT Treatment/Interventions  Patient/family education;Neuromuscular re-education;Therapeutic exercise;Therapeutic activities;Functional mobility training;ADLs/Self Care Home Management;Moist Heat;Electrical Stimulation;Iontophoresis 4mg /ml Dexamethasone;Ultrasound;Traction;Manual techniques;Dry needling;Taping    PT Next Visit Plan  test LLE strength. continue hip/lumbar strengthening.     Consulted and Agree with Plan of Care  Patient       Patient will benefit from skilled therapeutic intervention  in order to improve the following deficits and impairments:  Pain, Increased muscle spasms,  Impaired flexibility, Decreased range of motion, Decreased strength, Decreased activity tolerance, Difficulty walking, Postural dysfunction, Improper body mechanics  Visit Diagnosis: Acute bilateral low back pain with left-sided sciatica  Muscle spasm of back  Other muscle spasm  Abnormal posture     Problem List Patient Active Problem List   Diagnosis Date Noted  . Morbid obesity ([place]) 03/07/2018  . Dyspnea 10/22/2017  . Acute on chronic diastolic CHF (congestive heart failure) ([place])   . Bronchospasm, acute 10/21/2017  . Hyperlipidemia   . Chronic diastolic heart failure ([place])   . Atypical chest pain 06/30/2015  . OSA (obstructive sleep apnea) 02/10/2015  . GERD (gastroesophageal reflux disease) 02/10/2015  . Anxiety and depression 02/10/2015  . Anemia 11/16/2014  . Preventative health care 11/16/2014  . Hyperglycemia 11/16/2014  . HTN (hypertension) 10/10/2014  . Allergic rhinitis 10/10/2014   Kerin Perna, PTA 04/26/18 9:36 AM  [facility] [address]  [place], [place], [address] Phone: [phone]   Fax:  [phone]  Name: ZYMARION FAVORITE MRN: [id number] Date of Birth: [birth year]

## 2018-04-27 ENCOUNTER — Ambulatory Visit: Payer: BLUE CROSS/BLUE SHIELD

## 2018-04-27 DIAGNOSIS — R293 Abnormal posture: Secondary | ICD-10-CM | POA: Diagnosis not present

## 2018-04-27 DIAGNOSIS — M5442 Lumbago with sciatica, left side: Secondary | ICD-10-CM | POA: Diagnosis not present

## 2018-04-27 DIAGNOSIS — M62830 Muscle spasm of back: Secondary | ICD-10-CM | POA: Diagnosis not present

## 2018-04-27 DIAGNOSIS — M62838 Other muscle spasm: Secondary | ICD-10-CM | POA: Diagnosis not present

## 2018-04-27 NOTE — Therapy (Signed)
[facility] [address]  [place], [place], [address] Phone: [phone]   Fax:  [phone]  Physical Therapy Treatment  Patient Details  Name: Jeremy [name] MRN: [id number] Date of Birth: Sep 16, 1955 Referring Provider: Judi Saa, NP   Encounter Date: 04/27/2018  PT End of Session - 04/27/18 0848    Visit Number  5    Number of Visits  12    Date for PT Re-Evaluation  05/24/18    Authorization Type  BCBS    Authorization - Number of Visits  30    PT Start Time  0844    PT Stop Time  0945    PT Time Calculation (min)  61 min    Activity Tolerance  Patient tolerated treatment well    Behavior During Therapy  [facility] for tasks assessed/performed       Past Medical History:  Diagnosis Date  . Anemia 11/16/2014  . Bladder disease    "an unusual bladder disease; don't know what it's called" (06/30/2015)  . Bulging lumbar disc   . GERD (gastroesophageal reflux disease)   . History of hiatal hernia   . Hyperglycemia 11/16/2014  . Hyperlipemia   . Hypertension   . Joint pain, knee   . Migraine    06/30/2015 "maybe 2-3 times/year; not as bad as I used to have them"  . OSA (obstructive sleep apnea)     Past Surgical History:  Procedure Laterality Date  . ESOPHAGOGASTRODUODENOSCOPY    . ESOPHAGOGASTRODUODENOSCOPY (EGD) WITH ESOPHAGEAL DILATION  ~ 2014  . KNEE ARTHROSCOPY Right 1995  . NASAL SEPTUM SURGERY  ~ 1972  . TOTAL KNEE ARTHROPLASTY  10/11/2011   Procedure: TOTAL KNEE ARTHROPLASTY;  Surgeon: Rudean Haskell, MD;  Location: [place];  Service: Orthopedics;  Laterality: Right;  . 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)  ~2014    There were no vitals filed for this visit.  Subjective Assessment - 04/27/18 0846    Subjective  Pt. reporting some benefit from manual work to hip last visit.      Diagnostic tests  Lumbar x-ray 03/03/18 noted: Xray of the lumbar spine noted : There is  moderately severe disc space narrowing at L2-3. There is mild disc space narrowing at L3-4. Other disc spaces appear unremarkable. There are anterior osteophytes at L3, L4, and L5. There is facet osteoarthritic change at L5-S1 bilaterally.    Patient Stated Goals  "painless so I can get back to work"    Currently in Pain?  Yes    Pain Score  3     Pain Location  Hip    Pain Orientation  Left    Pain Descriptors / Indicators  Aching;Dull    Pain Type  Acute pain    Pain Onset  More than a month ago    Pain Frequency  Intermittent    Aggravating Factors   bending over, twisting     Pain Relieving Factors  electric stim.    Multiple Pain Sites  No         [facility] PT Assessment - 04/27/18 0853      Strength   Left Hip Flexion  4+/5    Left Hip Extension  4/5    Left Hip External Rotation  4+/5   mild pain on max effort in L buttocks   Left Hip Internal Rotation  4+/5   mild pain on max effort in L buttocks  Left Hip ABduction  4+/5    Left Hip ADduction  4+/5                   [facility] Adult PT Treatment/Exercise - 04/27/18 0856      Self-Care   Self-Care  Other Self-Care Comments    Other Self-Care Comments   Instruction in self ball release to L buttocks on wall for use at home       Lumbar Exercises: Stretches   Piriformis Stretch  Left;Right;2 reps;30 seconds    Piriformis Stretch Limitations  KTOS       Lumbar Exercises: Aerobic   Nustep  L4 x 7 min (LE only)      Lumbar Exercises: Machines for Strengthening   Other Lumbar Machine Exercise  BATCA lat pulldown 20# x 15 reps    Cues required for scap depression/retraction at end ROM   Other Lumbar Machine Exercise  BATCA low row 20# x 10 reps    Cues required for full scapular retraction at end ROM     Lumbar Exercises: Supine   Bridge  5 seconds;15 reps      Moist Heat Therapy   Number Minutes Moist Heat  15 Minutes    Moist Heat Location  Hip      Electrical Stimulation   Electrical Stimulation  Location  Lt lumbar paraspinals/ Lt superior and lateral glute    Electrical Stimulation Action  IFC - TENS IT unit   due to other E-stim machines in use   Electrical Stimulation Parameters  to tolerance, 15'    Electrical Stimulation Goals  Pain;Tone      Manual Therapy   Manual Therapy  Soft tissue mobilization;Myofascial release    Manual therapy comments  sidelying with L LE elevated on bolster     Soft tissue mobilization  STM to Lt glute med     Myofascial Release  TPR to Lt glute med with good response and pt. with good tolerance                PT Short Term Goals - 04/18/18 6546      PT SHORT TERM GOAL #1   Title  Independent with initial HEP    Status  On-going      PT SHORT TERM GOAL #2   Title  Pt will verbalize/demonstrate understanding of neutral spine and proper body mechanics to prevent/reduce LBP with daily tasks    Status  On-going        PT Long Term Goals - 04/26/18 0917      PT LONG TERM GOAL #1   Title  independent with ongoing/advanced HEP     Period  Weeks    Status  On-going      PT LONG TERM GOAL #2   Title  Verbalize understanding of proper posture/body mechanics with work-simualted tasks to reduce risk of reinjury     Time  6    Period  Weeks    Status  On-going      PT LONG TERM GOAL #3   Title  Report ability to perform work tasks without increase in LBP    Time  6    Period  Weeks    Status  On-going      PT LONG TERM GOAL #4   Title  Proximal LE strength >/= 4+/5 for improved core stability    Status  On-going            Plan - 04/27/18  3382    Clinical Impression Statement  Jeremy [name] doing well.  Felt fine after yesterday's visit.  Primary concern was ongoing tenderness in L buttocks thus addressed this with STM/TPR with good relief noted following.  Tim attending gym frequently throughout week thus reviewed appropriate machines today to promote lumbar/thoracic stability and monitor pt. technique.  Only minor cueing required  with machine strengthening activities today for proper scapular retraction.  Ended visit with E-stim/moist heat to buttocks area as pt. noting good relief from this last visit.  Progressing well toward goals.      PT Treatment/Interventions  Patient/family education;Neuromuscular re-education;Therapeutic exercise;Therapeutic activities;Functional mobility training;ADLs/Self Care Home Management;Moist Heat;Electrical Stimulation;Iontophoresis 4mg /ml Dexamethasone;Ultrasound;Traction;Manual techniques;Dry needling;Taping    Consulted and Agree with Plan of Care  Patient       Patient will benefit from skilled therapeutic intervention in order to improve the following deficits and impairments:  Pain, Increased muscle spasms, Impaired flexibility, Decreased range of motion, Decreased strength, Decreased activity tolerance, Difficulty walking, Postural dysfunction, Improper body mechanics  Visit Diagnosis: Acute bilateral low back pain with left-sided sciatica  Muscle spasm of back  Other muscle spasm  Abnormal posture     Problem List Patient Active Problem List   Diagnosis Date Noted  . Morbid obesity ([place]) 03/07/2018  . Dyspnea 10/22/2017  . Acute on chronic diastolic CHF (congestive heart failure) ([place])   . Bronchospasm, acute 10/21/2017  . Hyperlipidemia   . Chronic diastolic heart failure ([place])   . Atypical chest pain 06/30/2015  . OSA (obstructive sleep apnea) 02/10/2015  . GERD (gastroesophageal reflux disease) 02/10/2015  . Anxiety and depression 02/10/2015  . Anemia 11/16/2014  . Preventative health care 11/16/2014  . Hyperglycemia 11/16/2014  . HTN (hypertension) 10/10/2014  . Allergic rhinitis 10/10/2014    Bess Harvest, PTA 04/27/18 1:06 PM   [facility] [address]  [place], [place], [address] Phone: [phone]   Fax:  [phone]  Name: Jeremy [name] MRN: [id number] Date of Birth:  1956-03-11

## 2018-05-01 ENCOUNTER — Ambulatory Visit: Payer: BLUE CROSS/BLUE SHIELD | Admitting: Family

## 2018-05-01 ENCOUNTER — Encounter: Payer: Self-pay | Admitting: Family

## 2018-05-01 ENCOUNTER — Other Ambulatory Visit: Payer: Self-pay | Admitting: Family

## 2018-05-01 ENCOUNTER — Ambulatory Visit: Payer: BLUE CROSS/BLUE SHIELD

## 2018-05-01 VITALS — BP 125/73 | HR 79 | Temp 98.3°F | Resp 16 | Ht 67.0 in | Wt 254.0 lb

## 2018-05-01 DIAGNOSIS — M5442 Lumbago with sciatica, left side: Secondary | ICD-10-CM

## 2018-05-01 DIAGNOSIS — M62838 Other muscle spasm: Secondary | ICD-10-CM

## 2018-05-01 DIAGNOSIS — R293 Abnormal posture: Secondary | ICD-10-CM

## 2018-05-01 DIAGNOSIS — Z23 Encounter for immunization: Secondary | ICD-10-CM

## 2018-05-01 DIAGNOSIS — M62830 Muscle spasm of back: Secondary | ICD-10-CM | POA: Diagnosis not present

## 2018-05-01 DIAGNOSIS — L03115 Cellulitis of right lower limb: Secondary | ICD-10-CM | POA: Diagnosis not present

## 2018-05-01 MED ORDER — DOXYCYCLINE HYCLATE 100 MG PO TABS
100.0000 mg | ORAL_TABLET | Freq: Two times a day (BID) | ORAL | 0 refills | Status: DC
Start: 2018-05-01 — End: 2018-07-19

## 2018-05-01 NOTE — Therapy (Signed)
[facility] [address]  [place], [place], [address] Phone: [phone]   Fax:  [phone]  Physical Therapy Treatment  Patient Details  Name: Jeremy [name] MRN: [id number] Date of Birth: April 27, 1956 Referring Provider: Judi Saa, NP   Encounter Date: 05/01/2018  PT End of Session - 05/01/18 0845    Visit Number  6    Number of Visits  12    Date for PT Re-Evaluation  05/24/18    Authorization Type  BCBS    Authorization - Number of Visits  50    PT Start Time  0840    PT Stop Time  0945    PT Time Calculation (min)  65 min    Activity Tolerance  Patient tolerated treatment well    Behavior During Therapy  [facility] for tasks assessed/performed       Past Medical History:  Diagnosis Date  . Anemia 11/16/2014  . Bladder disease    "an unusual bladder disease; don't know what it's called" (06/30/2015)  . Bulging lumbar disc   . GERD (gastroesophageal reflux disease)   . History of hiatal hernia   . Hyperglycemia 11/16/2014  . Hyperlipemia   . Hypertension   . Joint pain, knee   . Migraine    06/30/2015 "maybe 2-3 times/year; not as bad as I used to have them"  . OSA (obstructive sleep apnea)     Past Surgical History:  Procedure Laterality Date  . ESOPHAGOGASTRODUODENOSCOPY    . ESOPHAGOGASTRODUODENOSCOPY (EGD) WITH ESOPHAGEAL DILATION  ~ 2014  . KNEE ARTHROSCOPY Right 1995  . NASAL SEPTUM SURGERY  ~ 1972  . TOTAL KNEE ARTHROPLASTY  10/11/2011   Procedure: TOTAL KNEE ARTHROPLASTY;  Surgeon: Rudean Haskell, MD;  Location: [place];  Service: Orthopedics;  Laterality: Right;  . 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)  ~2014    There were no vitals filed for this visit.  Subjective Assessment - 05/01/18 0911    Subjective  Tim reporting he was walking in woods yesterday and walked through a spiders web.  Once he returned to home 20 min later after walk he reported to wife that  his R thigh felt "tight".  Pt. noting red area which he thinks is a bug bite on his inner R thigh.      Diagnostic tests  Lumbar x-ray 03/03/18 noted: Xray of the lumbar spine noted : There is moderately severe disc space narrowing at L2-3. There is mild disc space narrowing at L3-4. Other disc spaces appear unremarkable. There are anterior osteophytes at L3, L4, and L5. There is facet osteoarthritic change at L5-S1 bilaterally.    Patient Stated Goals  "painless so I can get back to work"    Currently in Pain?  Yes    Pain Score  3     Pain Location  Hip    Pain Orientation  Left    Pain Descriptors / Indicators  Aching;Dull    Pain Type  Acute pain    Pain Radiating Towards  None today    Pain Onset  More than a month ago    Pain Frequency  Intermittent    Aggravating Factors   Bending and twisting    Pain Relieving Factors  electric stim.      Effect of Pain on Daily Activities  avoids bending over    Multiple Pain Sites  No  [facility] Adult PT Treatment/Exercise - 05/01/18 0906      Self-Care   Self-Care  Other Self-Care Comments    Other Self-Care Comments   Discussion with pt. and secondary PT regarding nature of pt. R groin swelling and redness surrounding what pt. described as "bug bite"; strongly encouraged pt. to get this R thigh area checked out by MD as this area warm to touch, firm with pressure, with visible swelling, and diffuse redness extending along R groin/thigh with pt. reporting discomfort at this sight which has increased since yesterday      Lumbar Exercises: Stretches   Passive Hamstring Stretch  Left;Right;30 seconds;3 reps    Passive Hamstring Stretch Limitations  strap       Lumbar Exercises: Aerobic   Nustep  L4 x 7 min (LE only)      Lumbar Exercises: Supine   Bridge  5 seconds;15 reps    Other Supine Lumbar Exercises  Hooklying alt hip ABD/ER with green TB x 12      Moist Heat Therapy   Number Minutes Moist Heat  15  Minutes    Moist Heat Location  Hip      Electrical Stimulation   Electrical Stimulation Location  Lt lumbar paraspinals/ Lt superior and lateral glute    Electrical Stimulation Action  IFC    Electrical Stimulation Parameters  to tolerance, 15'    Electrical Stimulation Goals  Pain;Tone      Manual Therapy   Manual Therapy  Soft tissue mobilization;Myofascial release    Manual therapy comments  sidelying with L LE elevated on bolster     Soft tissue mobilization  STM to L glute max & med     Myofascial Release  TPR to L glute med with pt. noting some improvement in tendernes since last visit.                 PT Short Term Goals - 04/18/18 3716      PT SHORT TERM GOAL #1   Title  Independent with initial HEP    Status  On-going      PT SHORT TERM GOAL #2   Title  Pt will verbalize/demonstrate understanding of neutral spine and proper body mechanics to prevent/reduce LBP with daily tasks    Status  On-going        PT Long Term Goals - 04/26/18 0917      PT LONG TERM GOAL #1   Title  independent with ongoing/advanced HEP     Period  Weeks    Status  On-going      PT LONG TERM GOAL #2   Title  Verbalize understanding of proper posture/body mechanics with work-simualted tasks to reduce risk of reinjury     Time  6    Period  Weeks    Status  On-going      PT LONG TERM GOAL #3   Title  Report ability to perform work tasks without increase in LBP    Time  6    Period  Weeks    Status  On-going      PT LONG TERM GOAL #4   Title  Proximal LE strength >/= 4+/5 for improved core stability    Status  On-going            Plan - 05/01/18 1241    Clinical Impression Statement  Pt. seen today with what he described as a, "bug bite" on R groin/thigh and states he walked in woods  yesterday, walking through "spider web".  R medial thigh with visible swelling and diffuse redness around site of "bug bite" with warmth to touch and firmness ~ size of palm around area.   Pt. strongly encouraged to see MD regarding this area and MD appointment scheduled for 4pm later today while with pt.  Session focusing on gentle lumbopelvic strengthening and manual therapy today and ended with E-stim/moist heat to lower back/buttocks as pt. continues with tenderness in this area.  Will f/u with pt. regarding today's MD app at upcoming visit.      PT Treatment/Interventions  Patient/family education;Neuromuscular re-education;Therapeutic exercise;Therapeutic activities;Functional mobility training;ADLs/Self Care Home Management;Moist Heat;Electrical Stimulation;Iontophoresis 4mg /ml Dexamethasone;Ultrasound;Traction;Manual techniques;Dry needling;Taping    Consulted and Agree with Plan of Care  Patient       Patient will benefit from skilled therapeutic intervention in order to improve the following deficits and impairments:  Pain, Increased muscle spasms, Impaired flexibility, Decreased range of motion, Decreased strength, Decreased activity tolerance, Difficulty walking, Postural dysfunction, Improper body mechanics  Visit Diagnosis: Acute bilateral low back pain with left-sided sciatica  Muscle spasm of back  Other muscle spasm  Abnormal posture     Problem List Patient Active Problem List   Diagnosis Date Noted  . Morbid obesity ([place]) 03/07/2018  . Dyspnea 10/22/2017  . Acute on chronic diastolic CHF (congestive heart failure) ([place])   . Bronchospasm, acute 10/21/2017  . Hyperlipidemia   . Chronic diastolic heart failure ([place])   . Atypical chest pain 06/30/2015  . OSA (obstructive sleep apnea) 02/10/2015  . GERD (gastroesophageal reflux disease) 02/10/2015  . Anxiety and depression 02/10/2015  . Anemia 11/16/2014  . Preventative health care 11/16/2014  . Hyperglycemia 11/16/2014  . HTN (hypertension) 10/10/2014  . Allergic rhinitis 10/10/2014    Bess Harvest, PTA 05/01/18 12:46 PM    [facility] [address]  [place], [place], [address] Phone: [phone]   Fax:  [phone]  Name: Jeremy [name] MRN: [id number] Date of Birth: 1956-06-19

## 2018-05-01 NOTE — Patient Instructions (Signed)
Please begin doxycycline for cellulitis. Monitor the area closely and let me know if you develop increased pain/redness/swelling or if fever.

## 2018-05-01 NOTE — Progress Notes (Signed)
Subjective:    [name], male    DOB: 1956/05/03, 62 y.o.   MRN: [id number]  HPI  Patient is a 62 yr old male who presents today with chief complaint of redness on his right inner thigh. Started last night. Denies associated with pain or fever.  Reports that he was doing some walking on a trail outdoors yesterday and walked through some spider webs.   Review of Systems See HPI  Past Medical History:  Diagnosis Date  . Anemia 11/16/2014  . Bladder disease    "an unusual bladder disease; don't know what it's called" (06/30/2015)  . Bulging lumbar disc   . GERD (gastroesophageal reflux disease)   . History of hiatal hernia   . Hyperglycemia 11/16/2014  . Hyperlipemia   . Hypertension   . Joint pain, knee   . Migraine    06/30/2015 "maybe 2-3 times/year; not as bad as I used to have them"  . OSA (obstructive sleep apnea)      Social History   Socioeconomic History  . Marital status: Married    Spouse name: Not on file  . Number of children: 3  . Years of education: Not on file  . Highest education level: Not on file  Occupational History  . Not on file  Social Needs  . Financial resource strain: Not on file  . Food insecurity:    Worry: Not on file    Inability: Not on file  . Transportation needs:    Medical: Not on file    Non-medical: Not on file  Tobacco Use  . Smoking status: Former Smoker    Types: Cigarettes  . Smokeless tobacco: Never Used  . Tobacco comment: "stopped smoking in ~ 1990"  Substance and Sexual Activity  . Alcohol use: No    Alcohol/week: 0.0 standard drinks  . Drug use: No  . Sexual activity: Yes  Lifestyle  . Physical activity:    Days per week: Not on file    Minutes per session: Not on file  . Stress: Not on file  Relationships  . Social connections:    Talks on phone: Not on file    Gets together: Not on file    Attends religious service: Not on file    Active member of club or organization: Not on file   Attends meetings of clubs or organizations: Not on file    Relationship status: Not on file  . Intimate partner violence:    Fear of current or ex partner: Not on file    Emotionally abused: Not on file    Physically abused: Not on file    Forced sexual activity: Not on file  Other Topics Concern  . 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 ESOPHAGOGASTRODUODENOSCOPY    . ESOPHAGOGASTRODUODENOSCOPY (EGD) WITH ESOPHAGEAL DILATION  ~ 2014  . KNEE ARTHROSCOPY Right 1995  . NASAL SEPTUM SURGERY  ~ 1972  . TOTAL KNEE ARTHROPLASTY  10/11/2011   Procedure: TOTAL KNEE ARTHROPLASTY;  Surgeon: Rudean Haskell, MD;  Location: [facility];  Service: Orthopedics;  Laterality: Right;  . 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 Stroke Mother 74  Died of CVA  . Hypertension Mother   . Anesthesia problems Neg Hx   . Hypotension Neg Hx   . Malignant hyperthermia Neg Hx   . Pseudochol deficiency Neg Hx     No Known Allergies  Current Outpatient Medications on File Prior to Visit  Medication Sig Dispense Refill  . albuterol (PROVENTIL HFA;VENTOLIN HFA) 108 (90 Base) MCG/ACT inhaler Inhale 2 puffs into the lungs every 6 (six) hours as needed for wheezing or shortness of breath. 1 Inhaler 2  . albuterol (PROVENTIL) (2.5 MG/3ML) 0.083% nebulizer solution Take 3 mLs (2.5 mg total) by nebulization every 6 (six) hours as needed for wheezing or shortness of breath. 150 mL 1  . ALPRAZolam (XANAX) 0.5 MG tablet Take 0.5 tablets (0.25 mg total) by mouth 2 (two) times daily as needed for anxiety. 30 tablet 0  . amLODipine-benazepril (LOTREL) 10-40 MG capsule Take 1 capsule by mouth daily. 90 capsule 1  . ARIPiprazole (ABILIFY) 15 MG tablet Take 0.5  tablets (7.5 mg total) by mouth daily. 30 tablet 0  . b complex vitamins tablet Take 1 tablet by mouth daily.    . benzonatate (TESSALON) 100 MG capsule Take 1 capsule (100 mg total) by mouth 3 (three) times daily as needed for cough. 30 capsule 0  . cyclobenzaprine (FLEXERIL) 10 MG tablet Take 1 tablet by mouth as directed.  0  . ferrous sulfate 325 (65 FE) MG tablet Take 1 tablet (325 mg total) by mouth 2 (two) times daily with a meal. 60 tablet 0  . fexofenadine (ALLEGRA) 180 MG tablet Take 1 tablet (180 mg total) by mouth daily. 30 tablet 0  . furosemide (LASIX) 20 MG tablet TAKE 1 TABLET DAILY 90 tablet 1  . hydrochlorothiazide (HYDRODIURIL) 25 MG tablet TAKE 1 TABLET DAILY 90 tablet 1  . meloxicam (MOBIC) 7.5 MG tablet Take 1 tablet (7.5 mg total) by mouth daily. 30 tablet 0  . metoprolol succinate (TOPROL-XL) 50 MG 24 hr tablet Take 1 tablet (50 mg total) by mouth daily. Take with or immediately following a meal. 30 tablet 3  . Multiple Vitamin (MULTIVITAMIN WITH MINERALS) TABS tablet Take 1 tablet by mouth daily.    . Nebulizers (VIOS AEROSOL DELIVERY SYSTEM) MISC 1 each as directed.  0  . omeprazole (PRILOSEC) 40 MG capsule Take 1 capsule (40 mg total) by mouth daily. 90 capsule 0  . potassium chloride (K-DUR,KLOR-CON) 10 MEQ tablet Take 1 tablet (10 mEq total) by mouth daily. 30 tablet 0  . sertraline (ZOLOFT) 100 MG tablet Take 1.5 tablets (150 mg total) by mouth daily. 30 tablet 0  . sildenafil (VIAGRA) 100 MG tablet Take 1 tablet (100 mg total) by mouth daily as needed for erectile dysfunction. 10 tablet 2   No current facility-administered medications on file prior to visit.     BP 125/73 (BP Location: Right Arm, Patient Position: Sitting, Cuff Size: Large)   Pulse 79   Temp 98.3 F (36.8 C) (Oral)   Resp 16   Ht 5\' 7"  (1.702 m)   Wt 254 lb (115.2 kg)   SpO2 94%   BMI 39.78 kg/m       Objective:   Physical Exam  Constitutional: He is oriented to person, place, and  time. He appears well-developed and well-nourished. No distress.  HENT:  Head: Normocephalic and atraumatic.  Cardiovascular: Normal rate and regular rhythm.  No murmur heard. Pulmonary/Chest: Effort normal and breath sounds normal. No respiratory distress. He has no wheezes. He  has no rales.  Musculoskeletal: He exhibits no edema.  Neurological: He is alert and oriented to person, place, and time.  Skin: Skin is warm and dry.     Approximately 1 inch wide area of erythema with some surrounding induration/lesser erythema  Psychiatric: He has a normal mood and affect. His behavior is normal. Thought content normal.          Assessment & Plan:  Cellulitis- presentation most consistent with cellulitis. Less likely due to insect bite. He does not recall a bite/sting yesterday.  Pt is advised as follows:  Please begin doxycycline for cellulitis. Monitor the area closely and let me know if you develop increased pain/redness/swelling or if fever.

## 2018-05-02 DIAGNOSIS — L03115 Cellulitis of right lower limb: Secondary | ICD-10-CM | POA: Diagnosis not present

## 2018-05-02 DIAGNOSIS — Z23 Encounter for immunization: Secondary | ICD-10-CM | POA: Diagnosis not present

## 2018-05-04 ENCOUNTER — Ambulatory Visit: Payer: BLUE CROSS/BLUE SHIELD

## 2018-05-04 DIAGNOSIS — M62830 Muscle spasm of back: Secondary | ICD-10-CM | POA: Diagnosis not present

## 2018-05-04 DIAGNOSIS — M5442 Lumbago with sciatica, left side: Secondary | ICD-10-CM

## 2018-05-04 DIAGNOSIS — M62838 Other muscle spasm: Secondary | ICD-10-CM

## 2018-05-04 DIAGNOSIS — R293 Abnormal posture: Secondary | ICD-10-CM

## 2018-05-04 NOTE — Therapy (Signed)
[facility] [address]  [place], [place], [address] Phone: [phone]   Fax:  [phone]  Physical Therapy Treatment  Patient Details  Name: Jeremy [name] MRN: [id number] Date of Birth: 02-25-1956 Referring Provider: Judi Saa, NP   Encounter Date: 05/04/2018  PT End of Session - 05/04/18 1030    Visit Number  7    Number of Visits  12    Date for PT Re-Evaluation  05/24/18    Authorization Type  [employer]    Authorization - Number of Visits  50    PT Start Time  1017    PT Stop Time  1100    PT Time Calculation (min)  43 min    Activity Tolerance  Patient tolerated treatment well    Behavior During Therapy  [facility] for tasks assessed/performed       Past Medical History:  Diagnosis Date  . Anemia 11/16/2014  . Bladder disease    "an unusual bladder disease; don't know what it's called" (06/30/2015)  . Bulging lumbar disc   . GERD (gastroesophageal reflux disease)   . History of hiatal hernia   . Hyperglycemia 11/16/2014  . Hyperlipemia   . Hypertension   . Joint pain, knee   . Migraine    06/30/2015 "maybe 2-3 times/year; not as bad as I used to have them"  . OSA (obstructive sleep apnea)     Past Surgical History:  Procedure Laterality Date  . ESOPHAGOGASTRODUODENOSCOPY    . ESOPHAGOGASTRODUODENOSCOPY (EGD) WITH ESOPHAGEAL DILATION  ~ 2014  . KNEE ARTHROSCOPY Right 1995  . NASAL SEPTUM SURGERY  ~ 1972  . TOTAL KNEE ARTHROPLASTY  10/11/2011   Procedure: TOTAL KNEE ARTHROPLASTY;  Surgeon: Rudean Haskell, MD;  Location: [facility];  Service: Orthopedics;  Laterality: Right;  . 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)  ~2014    There were no vitals filed for this visit.  Subjective Assessment - 05/04/18 1021    Subjective  Pt. noting less frequent and intense hip pain now only occasionally throughout day.  Notes he feels antibiotics are making a difference with improvement in  comfort and redness around R thigh area.  Pt. to go for f/u with MD tomorrow to check progress on R thigh.      Diagnostic tests  Lumbar x-ray 03/03/18 noted: Xray of the lumbar spine noted : There is moderately severe disc space narrowing at L2-3. There is mild disc space narrowing at L3-4. Other disc spaces appear unremarkable. There are anterior osteophytes at L3, L4, and L5. There is facet osteoarthritic change at L5-S1 bilaterally.    Patient Stated Goals  "painless so I can get back to work"    Currently in Pain?  Yes    Pain Score  3     Pain Location  Hip    Pain Descriptors / Indicators  Aching;Dull    Pain Type  Acute pain    Pain Onset  More than a month ago    Pain Frequency  Intermittent    Aggravating Factors   Bending and twisting    Multiple Pain Sites  No                       [facility] Adult PT Treatment/Exercise - 05/04/18 1031      Self-Care   Self-Care  Other Self-Care Comments    Other Self-Care Comments   Discussed proper body mechanics and  sitting/standing posture to reduce lumbar strain with daily tasks such as bending, lifting, twisting, sidelying in bed with pillow between knees; pt. verbalizing understanding       Lumbar Exercises: Stretches   Passive Hamstring Stretch  Left;2 reps;30 seconds    Passive Hamstring Stretch Limitations  sitting     Hip Flexor Stretch  Left;1 rep;30 seconds    Hip Flexor Stretch Limitations  mod thomas     Piriformis Stretch  Left;2 reps;30 seconds    Piriformis Stretch Limitations  KTOS       Lumbar Exercises: Aerobic   Nustep  L4 x 7 min (LE only)      Lumbar Exercises: Supine   Straight Leg Raise  15 reps;3 seconds    Other Supine Lumbar Exercises  Hooklying L hip abd/ER with green TB x 15 reps       Knee/Hip Exercises: Standing   Hip Flexion  Left;10 reps;Stengthening    Hip Flexion Limitations  yellow looped TB at ankles; counter     Hip Abduction  Left;10 reps;Stengthening    Abduction Limitations   yellow looped TB at [place]; counter     Hip Extension  Left;10 reps;Stengthening    Extension Limitations  yellow looped TB at ankles; counter    45 dg kickback     Manual Therapy   Manual Therapy  Soft tissue mobilization;Myofascial release    Manual therapy comments  sidelying with L LE elevated on bolster     Soft tissue mobilization  STM to L glute max & med     Myofascial Release  TPR to L glute max/piriformis    Reduced ttp with greater tolerance for increased pressure              PT Short Term Goals - 05/04/18 1049      PT SHORT TERM GOAL #1   Title  Independent with initial HEP    Status  Achieved      PT SHORT TERM GOAL #2   Title  Pt will verbalize/demonstrate understanding of neutral spine and proper body mechanics to prevent/reduce LBP with daily tasks    Status  Achieved        PT Long Term Goals - 04/26/18 0917      PT LONG TERM GOAL #1   Title  independent with ongoing/advanced HEP     Period  Weeks    Status  On-going      PT LONG TERM GOAL #2   Title  Verbalize understanding of proper posture/body mechanics with work-simualted tasks to reduce risk of reinjury     Time  6    Period  Weeks    Status  On-going      PT LONG TERM GOAL #3   Title  Report ability to perform work tasks without increase in LBP    Time  6    Period  Weeks    Status  On-going      PT LONG TERM GOAL #4   Title  Proximal LE strength >/= 4+/5 for improved core stability    Status  On-going            Plan - 05/04/18 1052    Clinical Impression Statement  Tim noting less tenderness and redness at R inner thigh since starting on antibiotics two days ago and has f/u with MD tomorrow regarding R thigh redness/tenderness.  Primary complaint today is ongoing L buttocks pain which he notes bothers him most with bending and  twisting motions.  Notes L hip pain has improved since starting therapy with decreased intensity, duration, and frequency.  Tolerated progression on  proximal hip strengthening activities well today with cueing provided for abdom. bracing as to reduce lumbar strain.  Ended visit discussing need for proper body mechanics and posture as to reduce lumbar strain with daily tasks.  Pt. pain free to end session thus modalities deferred.  Given pt. ongoing symptoms in L buttocks and increased tightness/tenderness in this area, pt. may benefit from trial of DN in future visits.      PT Treatment/Interventions  Patient/family education;Neuromuscular re-education;Therapeutic exercise;Therapeutic activities;Functional mobility training;ADLs/Self Care Home Management;Moist Heat;Electrical Stimulation;Iontophoresis 4mg /ml Dexamethasone;Ultrasound;Traction;Manual techniques;Dry needling;Taping    Consulted and Agree with Plan of Care  Patient       Patient will benefit from skilled therapeutic intervention in order to improve the following deficits and impairments:  Pain, Increased muscle spasms, Impaired flexibility, Decreased range of motion, Decreased strength, Decreased activity tolerance, Difficulty walking, Postural dysfunction, Improper body mechanics  Visit Diagnosis: Acute bilateral low back pain with left-sided sciatica  Muscle spasm of back  Other muscle spasm  Abnormal posture     Problem List Patient Active Problem List   Diagnosis Date Noted  . Morbid obesity ([place]) 03/07/2018  . Dyspnea 10/22/2017  . Acute on chronic diastolic CHF (congestive heart failure) ([place])   . Bronchospasm, acute 10/21/2017  . Hyperlipidemia   . Chronic diastolic heart failure ([place])   . Atypical chest pain 06/30/2015  . OSA (obstructive sleep apnea) 02/10/2015  . GERD (gastroesophageal reflux disease) 02/10/2015  . Anxiety and depression 02/10/2015  . Anemia 11/16/2014  . Preventative health care 11/16/2014  . Hyperglycemia 11/16/2014  . HTN (hypertension) 10/10/2014  . Allergic rhinitis 10/10/2014    Bess Harvest, PTA 05/04/18 11:21 AM   [facility] [address]  [place], [place], [address] Phone: [phone]   Fax:  [phone]  Name: Jeremy [name] MRN: [id number] Date of Birth: 01-Jan-1956

## 2018-05-04 NOTE — Patient Instructions (Signed)

## 2018-05-05 ENCOUNTER — Ambulatory Visit: Payer: BLUE CROSS/BLUE SHIELD | Admitting: Family

## 2018-05-05 VITALS — BP 130/80 | HR 66 | Temp 97.7°F | Resp 18 | Ht 67.0 in | Wt 256.2 lb

## 2018-05-05 DIAGNOSIS — N529 Male erectile dysfunction, unspecified: Secondary | ICD-10-CM

## 2018-05-05 DIAGNOSIS — L0390 Cellulitis, unspecified: Secondary | ICD-10-CM | POA: Diagnosis not present

## 2018-05-05 NOTE — Progress Notes (Signed)
Subjective:    [name], male    DOB: 03-26-1956, 62 y.o.   MRN: [id number]  HPI  Jeremy [name] is a 62 yr old male who presents today for follow up of his cellulitis.  Last visit he was started on doxycycline for cellulitis of the right thigh. Today is day 5/10.   ED- tried sildenafil without response  Review of Systems    see HPI  Past Medical History:  Diagnosis Date  . Anemia 11/16/2014  . Bladder disease    "an unusual bladder disease; don't know what it's called" (06/30/2015)  . Bulging lumbar disc   . GERD (gastroesophageal reflux disease)   . History of hiatal hernia   . Hyperglycemia 11/16/2014  . Hyperlipemia   . Hypertension   . Joint pain, knee   . Migraine    06/30/2015 "maybe 2-3 times/year; not as bad as I used to have them"  . OSA (obstructive sleep apnea)      Social History   Socioeconomic History  . Marital status: Married    Spouse name: Not on file  . Number of children: 3  . Years of education: Not on file  . Highest education level: Not on file  Occupational History  . Not on file  Social Needs  . Financial resource strain: Not on file  . Food insecurity:    Worry: Not on file    Inability: Not on file  . Transportation needs:    Medical: Not on file    Non-medical: Not on file  Tobacco Use  . Smoking status: Former Smoker    Types: Cigarettes  . Smokeless tobacco: Never Used  . Tobacco comment: "stopped smoking in ~ 1990"  Substance and Sexual Activity  . Alcohol use: No    Alcohol/week: 0.0 standard drinks  . Drug use: No  . Sexual activity: Yes  Lifestyle  . Physical activity:    Days per week: Not on file    Minutes per session: Not on file  . Stress: Not on file  Relationships  . Social connections:    Talks on phone: Not on file    Gets together: Not on file    Attends religious service: Not on file    Active member of club or organization: Not on file    Attends meetings of clubs or organizations: Not on  file    Relationship status: Not on file  . Intimate partner violence:    Fear of current or ex partner: Not on file    Emotionally abused: Not on file    Physically abused: Not on file    Forced sexual activity: Not on file  Other Topics Concern  . 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 ESOPHAGOGASTRODUODENOSCOPY    . ESOPHAGOGASTRODUODENOSCOPY (EGD) WITH ESOPHAGEAL DILATION  ~ 2014  . KNEE ARTHROSCOPY Right 1995  . NASAL SEPTUM SURGERY  ~ 1972  . TOTAL KNEE ARTHROPLASTY  10/11/2011   Procedure: TOTAL KNEE ARTHROPLASTY;  Surgeon: Rudean Haskell, MD;  Location: [facility];  Service: Orthopedics;  Laterality: Right;  . 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 Stroke Mother 31  Died of CVA  . Hypertension Mother   . Anesthesia problems Neg Hx   . Hypotension Neg Hx   . Malignant hyperthermia Neg Hx   . Pseudochol deficiency Neg Hx     No Known Allergies  Current Outpatient Medications on File Prior to Visit  Medication Sig Dispense Refill  . albuterol (PROVENTIL HFA;VENTOLIN HFA) 108 (90 Base) MCG/ACT inhaler Inhale 2 puffs into the lungs every 6 (six) hours as needed for wheezing or shortness of breath. 1 Inhaler 2  . albuterol (PROVENTIL) (2.5 MG/3ML) 0.083% nebulizer solution Take 3 mLs (2.5 mg total) by nebulization every 6 (six) hours as needed for wheezing or shortness of breath. 150 mL 1  . ALPRAZolam (XANAX) 0.5 MG tablet Take 0.5 tablets (0.25 mg total) by mouth 2 (two) times daily as needed for anxiety. 30 tablet 0  . amLODipine-benazepril (LOTREL) 10-40 MG capsule Take 1 capsule by mouth daily. 90 capsule 1  . ARIPiprazole (ABILIFY) 15 MG tablet Take 0.5 tablets (7.5 mg total) by mouth daily. 30 tablet 0  .  b complex vitamins tablet Take 1 tablet by mouth daily.    . benzonatate (TESSALON) 100 MG capsule Take 1 capsule (100 mg total) by mouth 3 (three) times daily as needed for cough. 30 capsule 0  . cyclobenzaprine (FLEXERIL) 10 MG tablet Take 1 tablet by mouth as directed.  0  . doxycycline (VIBRA-TABS) 100 MG tablet Take 1 tablet (100 mg total) by mouth 2 (two) times daily. 20 tablet 0  . ferrous sulfate 325 (65 FE) MG tablet Take 1 tablet (325 mg total) by mouth 2 (two) times daily with a meal. 60 tablet 0  . fexofenadine (ALLEGRA) 180 MG tablet Take 1 tablet (180 mg total) by mouth daily. 30 tablet 0  . furosemide (LASIX) 20 MG tablet TAKE 1 TABLET DAILY 90 tablet 1  . hydrochlorothiazide (HYDRODIURIL) 25 MG tablet TAKE 1 TABLET DAILY 90 tablet 1  . meloxicam (MOBIC) 7.5 MG tablet Take 1 tablet (7.5 mg total) by mouth daily. 30 tablet 0  . metoprolol succinate (TOPROL-XL) 50 MG 24 hr tablet Take 1 tablet (50 mg total) by mouth daily. Take with or immediately following a meal. 30 tablet 3  . Multiple Vitamin (MULTIVITAMIN WITH MINERALS) TABS tablet Take 1 tablet by mouth daily.    . Nebulizers (VIOS AEROSOL DELIVERY SYSTEM) MISC 1 each as directed.  0  . omeprazole (PRILOSEC) 40 MG capsule Take 1 capsule (40 mg total) by mouth daily. 90 capsule 0  . potassium chloride (K-DUR,KLOR-CON) 10 MEQ tablet Take 1 tablet (10 mEq total) by mouth daily. 30 tablet 0  . sertraline (ZOLOFT) 100 MG tablet Take 1.5 tablets (150 mg total) by mouth daily. 30 tablet 0  . sildenafil (VIAGRA) 100 MG tablet Take 1 tablet (100 mg total) by mouth daily as needed for erectile dysfunction. 10 tablet 2   No current facility-administered medications on file prior to visit.     BP 130/80 (BP Location: Right Arm, Cuff Size: Large)   Pulse 66   Temp 97.7 F (36.5 C) (Oral)   Resp 18   Ht 5\' 7"  (1.702 m)   Wt 256 lb 3.2 oz (116.2 kg)   SpO2 99%   BMI 40.13 kg/m    Objective:   Physical Exam  Constitutional: He is  oriented to person, place, and time. He appears well-developed and well-nourished. No distress.  Musculoskeletal: He exhibits no edema.  Neurological: He is alert and oriented to person,  place, and time.  Skin:  Decreased erythema/induration right upper thigh          Assessment & Plan:  Cellulitis- improving.  Advised pt to complete doxycycline and let us know if symptoms do not continue to improve.  ED- not responding to sildenafil- will refer to urology for further evaluation.

## 2018-05-05 NOTE — Patient Instructions (Signed)
Please complete your antibiotics. Let us know if the area does not continue to improve.

## 2018-05-08 ENCOUNTER — Ambulatory Visit: Payer: BLUE CROSS/BLUE SHIELD | Admitting: Physical Therapy

## 2018-05-08 ENCOUNTER — Encounter: Payer: Self-pay | Admitting: Physical Therapy

## 2018-05-08 DIAGNOSIS — M62830 Muscle spasm of back: Secondary | ICD-10-CM | POA: Diagnosis not present

## 2018-05-08 DIAGNOSIS — M62838 Other muscle spasm: Secondary | ICD-10-CM | POA: Diagnosis not present

## 2018-05-08 DIAGNOSIS — M5442 Lumbago with sciatica, left side: Secondary | ICD-10-CM | POA: Diagnosis not present

## 2018-05-08 DIAGNOSIS — R293 Abnormal posture: Secondary | ICD-10-CM | POA: Diagnosis not present

## 2018-05-08 NOTE — Patient Instructions (Signed)

## 2018-05-08 NOTE — Therapy (Signed)
Jeremy [name]  Physical Therapy Treatment  Patient Details  Name: Jeremy [name] MRN: [id number] Date of Birth: 28-Sep-1955 Referring Provider: Judi Saa, NP   Encounter Date: 05/08/2018  PT End of Session - 05/08/18 0843    Visit Number  8    Number of Visits  12    Date for PT Re-Evaluation  05/24/18    Authorization Type  BCBS    Authorization - Number of Visits  10    PT Start Time  0843    PT Stop Time  0928    PT Time Calculation (min)  45 min    Activity Tolerance  Patient tolerated treatment well    Behavior During Therapy  [facility] for tasks assessed/performed       Past Medical History:  Diagnosis Date  . Anemia 11/16/2014  . Bladder disease    "an unusual bladder disease; don't know what it's called" (06/30/2015)  . Bulging lumbar disc   . GERD (gastroesophageal reflux disease)   . History of hiatal hernia   . Hyperglycemia 11/16/2014  . Hyperlipemia   . Hypertension   . Joint pain, knee   . Migraine    06/30/2015 "maybe 2-3 times/year; not as bad as I used to have them"  . OSA (obstructive sleep apnea)     Past Surgical History:  Procedure Laterality Date  . ESOPHAGOGASTRODUODENOSCOPY    . ESOPHAGOGASTRODUODENOSCOPY (EGD) WITH ESOPHAGEAL DILATION  ~ 2014  . KNEE ARTHROSCOPY Right 1995  . NASAL SEPTUM SURGERY  ~ 1972  . TOTAL KNEE ARTHROPLASTY  10/11/2011   Procedure: TOTAL KNEE ARTHROPLASTY;  Surgeon: Rudean Haskell, MD;  Location: [place];  Service: Orthopedics;  Laterality: Right;  . 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)  ~2014    There were no vitals filed for this visit.  Subjective Assessment - 05/08/18 0846    Subjective  Pt reporting ongoing lateral buttock pain and tightness, but with dercreasing intensity and frequency.    Diagnostic tests  Lumbar x-ray 03/03/18 noted:  Xray of the lumbar spine noted : There is moderately severe disc space narrowing at L2-3. There is mild disc space narrowing at L3-4. Other disc spaces appear unremarkable. There are anterior osteophytes at L3, L4, and L5. There is facet osteoarthritic change at L5-S1 bilaterally.    Patient Stated Goals  "painless so I can get back to work"    Currently in Pain?  Yes    Pain Score  3     Pain Location  Hip    Pain Orientation  Left    Pain Descriptors / Indicators  Aching;Tightness    Pain Type  Acute pain    Pain Onset  --    Pain Frequency  Intermittent                       [facility] Adult PT Treatment/Exercise - 05/08/18 0843      Exercises   Exercises  Lumbar      Lumbar Exercises: Stretches   Passive Hamstring Stretch  Left;30 seconds;1 rep    Passive Hamstring Stretch Limitations  supine with strap    ITB Stretch  Left;30 seconds;1 rep    ITB Stretch Limitations  supine with strap    Piriformis Stretch  Left;30 seconds;2 reps    Piriformis Stretch Limitations  KTOS  Lumbar Exercises: Aerobic   Nustep  L5 x 7 min (LE only)      Lumbar Exercises: Supine   Bridge with clamshell  10 reps;5 seconds    Bridge with [employer] Limitations  + alt hip ABD/ER with green TB      Lumbar Exercises: Sidelying   Clam  Left;10 reps;3 seconds    Clam Limitations  green TB      Manual Therapy   Manual Therapy  Soft tissue mobilization;Myofascial release    Manual therapy comments  sidelying with L LE elevated on bolster     Soft tissue mobilization  STM to L glutes & piriformis    Myofascial Release  manual TPR to L glute mininus/medius & piriformis       Trigger Point Dry Needling - 05/08/18 0843    Consent Given?  Yes    Education Handout Provided  Yes    Muscles Treated Lower Body  Gluteus minimus;Gluteus maximus;Piriformis   Left   Gluteus Maximus Response  Twitch response elicited;Palpable increased muscle length    Gluteus Minimus Response  Twitch  response elicited;Palpable increased muscle length    Piriformis Response  Twitch response elicited;Palpable increased muscle length           PT Education - 05/08/18 0850    Education Details  Role of dry needling, precautions, expected response to treatment & recommended activity level following treatment    Person(s) Educated  Patient    Methods  Explanation;Handout    Comprehension  Verbalized understanding       PT Short Term Goals - 05/04/18 1049      PT SHORT TERM GOAL #1   Title  Independent with initial HEP    Status  Achieved      PT SHORT TERM GOAL #2   Title  Pt will verbalize/demonstrate understanding of neutral spine and proper body mechanics to prevent/reduce LBP with daily tasks    Status  Achieved        PT Long Term Goals - 04/26/18 0917      PT LONG TERM GOAL #1   Title  independent with ongoing/advanced HEP     Period  Weeks    Status  On-going      PT LONG TERM GOAL #2   Title  Verbalize understanding of proper posture/body mechanics with work-simualted tasks to reduce risk of reinjury     Time  6    Period  Weeks    Status  On-going      PT LONG TERM GOAL #3   Title  Report ability to perform work tasks without increase in LBP    Time  6    Period  Weeks    Status  On-going      PT LONG TERM GOAL #4   Title  Proximal LE strength >/= 4+/5 for improved core stability    Status  On-going            Plan - 05/08/18 0850    Clinical Impression Statement  Tim noting ongoing lateral L buttock pain and tightness which has been improving but still bothers him with certain motions, esp bending and twisting. Persistant increased muscle tension and TPs persist in lateral L glutes and piritormis which were addressed with manual therapy including DN after informed pt consent, with palpable reduction in muscle tension and ttp noted following treatment. Treatment concluded with review of relevant stretches and lumbopelvic strenghtening to promote  continued improvement in muscle tension.  PT Treatment/Interventions  Patient/family education;Neuromuscular re-education;Therapeutic exercise;Therapeutic activities;Functional mobility training;ADLs/Self Care Home Management;Moist Heat;Electrical Stimulation;Iontophoresis 4mg /ml Dexamethasone;Ultrasound;Traction;Manual techniques;Dry needling;Taping    Consulted and Agree with Plan of Care  Patient       Patient will benefit from skilled therapeutic intervention in order to improve the following deficits and impairments:  Pain, Increased muscle spasms, Impaired flexibility, Decreased range of motion, Decreased strength, Decreased activity tolerance, Difficulty walking, Postural dysfunction, Improper body mechanics  Visit Diagnosis: Acute bilateral low back pain with left-sided sciatica  Muscle spasm of back  Other muscle spasm  Abnormal posture     Problem List Patient Active Problem List   Diagnosis Date Noted  . Morbid obesity ([place]) 03/07/2018  . Dyspnea 10/22/2017  . Acute on chronic diastolic CHF (congestive heart failure) ([place])   . Bronchospasm, acute 10/21/2017  . Hyperlipidemia   . Chronic diastolic heart failure ([place])   . Atypical chest pain 06/30/2015  . OSA (obstructive sleep apnea) 02/10/2015  . GERD (gastroesophageal reflux disease) 02/10/2015  . Anxiety and depression 02/10/2015  . Anemia 11/16/2014  . Preventative health care 11/16/2014  . Hyperglycemia 11/16/2014  . HTN (hypertension) 10/10/2014  . Allergic rhinitis 10/10/2014    Percival Spanish, PT, MPT 05/08/2018, 2:12 PM  [facility] [address]  [place], [place], [address] Phone: [phone]   Fax:  [phone]  Name: Jeremy [name] MRN: [id number] Date of Birth: 06-28-1956

## 2018-05-09 DIAGNOSIS — F410 Panic disorder [episodic paroxysmal anxiety] without agoraphobia: Secondary | ICD-10-CM | POA: Diagnosis not present

## 2018-05-10 ENCOUNTER — Telehealth: Payer: Self-pay | Admitting: *Deleted

## 2018-05-10 NOTE — Telephone Encounter (Signed)
Received request for "All Office Visit Notes from March 30, 2018 through the present" from [employer] as part of review of patient's disability claim; forwarded via fax/SLS 09/18

## 2018-05-11 ENCOUNTER — Ambulatory Visit: Payer: BLUE CROSS/BLUE SHIELD

## 2018-05-11 DIAGNOSIS — M62838 Other muscle spasm: Secondary | ICD-10-CM

## 2018-05-11 DIAGNOSIS — M62830 Muscle spasm of back: Secondary | ICD-10-CM | POA: Diagnosis not present

## 2018-05-11 DIAGNOSIS — R293 Abnormal posture: Secondary | ICD-10-CM | POA: Diagnosis not present

## 2018-05-11 DIAGNOSIS — M5442 Lumbago with sciatica, left side: Secondary | ICD-10-CM

## 2018-05-11 NOTE — Therapy (Signed)
[facility] [place] [address]  [place], [place], [address] Phone: [phone]   Fax:  [phone]  Physical Therapy Treatment  Patient Details  Name: Jeremy [name] MRN: [id number] Date of Birth: 1956-02-10 Referring Provider: Judi Saa, NP   Encounter Date: 05/11/2018  PT End of Session - 05/11/18 1018    Visit Number  9    Number of Visits  12    Date for PT Re-Evaluation  05/24/18    Authorization Type  BCBS    Authorization - Number of Visits  50    PT Start Time  1015    PT Stop Time  1100    PT Time Calculation (min)  45 min    Activity Tolerance  Patient tolerated treatment well    Behavior During Therapy  [facility] for tasks assessed/performed       Past Medical History:  Diagnosis Date  . Anemia 11/16/2014  . Bladder disease    "an unusual bladder disease; don't know what it's called" (06/30/2015)  . Bulging lumbar disc   . GERD (gastroesophageal reflux disease)   . History of hiatal hernia   . Hyperglycemia 11/16/2014  . Hyperlipemia   . Hypertension   . Joint pain, knee   . Migraine    06/30/2015 "maybe 2-3 times/year; not as bad as I used to have them"  . OSA (obstructive sleep apnea)     Past Surgical History:  Procedure Laterality Date  . ESOPHAGOGASTRODUODENOSCOPY    . ESOPHAGOGASTRODUODENOSCOPY (EGD) WITH ESOPHAGEAL DILATION  ~ 2014  . KNEE ARTHROSCOPY Right 1995  . NASAL SEPTUM SURGERY  ~ 1972  . TOTAL KNEE ARTHROPLASTY  10/11/2011   Procedure: TOTAL KNEE ARTHROPLASTY;  Surgeon: Rudean Haskell, MD;  Location: [facility];  Service: Orthopedics;  Laterality: Right;  . 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)  ~2014    There were no vitals filed for this visit.  Subjective Assessment - 05/11/18 1017    Subjective  Pt. unsure of benefit from DN.      Diagnostic tests  Lumbar x-ray 03/03/18 noted: Xray of the lumbar spine noted : There is moderately severe disc space  narrowing at L2-3. There is mild disc space narrowing at L3-4. Other disc spaces appear unremarkable. There are anterior osteophytes at L3, L4, and L5. There is facet osteoarthritic change at L5-S1 bilaterally.    Patient Stated Goals  "painless so I can get back to work"    Currently in Pain?  Yes    Pain Score  3     Pain Location  Hip    Pain Orientation  Left    Pain Descriptors / Indicators  Aching;Tightness    Pain Type  Acute pain    Pain Radiating Towards  none today     Pain Onset  More than a month ago    Pain Frequency  Intermittent    Aggravating Factors   bending     Pain Relieving Factors  heat and E-stim.    Multiple Pain Sites  No         [facility] PT Assessment - 05/11/18 1039      Assessment   Next MD Visit  9.30.19                   [facility] Adult PT Treatment/Exercise - 05/11/18 1025      Lumbar Exercises: Stretches   Passive Hamstring Stretch  Left;30 seconds;2 reps  Passive Hamstring Stretch Limitations  with towel     Piriformis Stretch  Left;3 reps;30 seconds    Piriformis Stretch Limitations  KTOS, Figure-4 with towel       Lumbar Exercises: Aerobic   Recumbent Bike  Lvl 2, 7 min       Lumbar Exercises: Standing   Functional Squats  15 reps    Functional Squats Limitations  TRX - chair behind     Other Standing Lumbar Exercises  Side stepping with green TB around forefoot 2 x 20 ft       Lumbar Exercises: Seated   Sit to Stand  15 reps    Sit to Stand Limitations  with isometric hip abd/ER into green TB to table       Lumbar Exercises: Supine   Bridge  5 seconds;15 reps    Bridge Limitations  + L hip abd/ER into green TB at top of movement      Lumbar Exercises: Sidelying   Clam  Left;3 seconds   x 12 reps    Clam Limitations  green TB      Manual Therapy   Manual Therapy  Soft tissue mobilization;Myofascial release    Manual therapy comments  sidelying with L LE elevated on bolster     Soft tissue mobilization  STM to L glutes &  piriformis, glute med    Myofascial Release  manual TPR to L glute medius & piriformis             PT Education - 05/11/18 1230    Education Details  HEP update with red TB issued to pt.     Person(s) Educated  Patient    Methods  Explanation;Demonstration;Verbal cues;Handout    Comprehension  Verbalized understanding;Returned demonstration;Verbal cues required;Need further instruction       PT Short Term Goals - 05/04/18 1049      PT SHORT TERM GOAL #1   Title  Independent with initial HEP    Status  Achieved      PT SHORT TERM GOAL #2   Title  Pt will verbalize/demonstrate understanding of neutral spine and proper body mechanics to prevent/reduce LBP with daily tasks    Status  Achieved        PT Long Term Goals - 04/26/18 0917      PT LONG TERM GOAL #1   Title  independent with ongoing/advanced HEP     Period  Weeks    Status  On-going      PT LONG TERM GOAL #2   Title  Verbalize understanding of proper posture/body mechanics with work-simualted tasks to reduce risk of reinjury     Time  6    Period  Weeks    Status  On-going      PT LONG TERM GOAL #3   Title  Report ability to perform work tasks without increase in LBP    Time  6    Period  Weeks    Status  On-going      PT LONG TERM GOAL #4   Title  Proximal LE strength >/= 4+/5 for improved core stability    Status  On-going            Plan - 05/11/18 1022    Clinical Impression Statement  Jeremy [name] reports he feels L buttocks pain has improved over this past week however unsure if this is due to DN or something else.  Continues manual DTM/TPR to L proximal hip as pt. still  with palpable increase in tension and with some remaining tenderness.  Ended visit with progression of standing and supine proximal hip strengthening activities with pt. tolerating well.      PT Treatment/Interventions  Patient/family education;Neuromuscular re-education;Therapeutic exercise;Therapeutic activities;Functional mobility  training;ADLs/Self Care Home Management;Moist Heat;Electrical Stimulation;Iontophoresis 4mg /ml Dexamethasone;Ultrasound;Traction;Manual techniques;Dry needling;Taping    Consulted and Agree with Plan of Care  Patient       Patient will benefit from skilled therapeutic intervention in order to improve the following deficits and impairments:  Pain, Increased muscle spasms, Impaired flexibility, Decreased range of motion, Decreased strength, Decreased activity tolerance, Difficulty walking, Postural dysfunction, Improper body mechanics  Visit Diagnosis: Acute bilateral low back pain with left-sided sciatica  Muscle spasm of back  Other muscle spasm  Abnormal posture     Problem List Patient Active Problem List   Diagnosis Date Noted  . Morbid obesity ([place]) 03/07/2018  . Dyspnea 10/22/2017  . Acute on chronic diastolic CHF (congestive heart failure) ([place])   . Bronchospasm, acute 10/21/2017  . Hyperlipidemia   . Chronic diastolic heart failure ([place])   . Atypical chest pain 06/30/2015  . OSA (obstructive sleep apnea) 02/10/2015  . GERD (gastroesophageal reflux disease) 02/10/2015  . Anxiety and depression 02/10/2015  . Anemia 11/16/2014  . Preventative health care 11/16/2014  . Hyperglycemia 11/16/2014  . HTN (hypertension) 10/10/2014  . Allergic rhinitis 10/10/2014   Bess Harvest, PTA 05/11/18 12:30 PM   [facility] [address]  [place], [place], [address] Phone: [phone]   Fax:  [phone]  Name: Jeremy [name] MRN: [id number] Date of Birth: August 15, 1956

## 2018-05-15 ENCOUNTER — Ambulatory Visit: Payer: BLUE CROSS/BLUE SHIELD

## 2018-05-15 DIAGNOSIS — R293 Abnormal posture: Secondary | ICD-10-CM

## 2018-05-15 DIAGNOSIS — M5442 Lumbago with sciatica, left side: Secondary | ICD-10-CM | POA: Diagnosis not present

## 2018-05-15 DIAGNOSIS — M62830 Muscle spasm of back: Secondary | ICD-10-CM

## 2018-05-15 DIAGNOSIS — M62838 Other muscle spasm: Secondary | ICD-10-CM

## 2018-05-15 NOTE — Therapy (Signed)
[facility] [place] [address]  [address] [place], [place], [address] Phone: [phone]   Fax:  [phone]  Physical Therapy Treatment  Patient Details  Name: Jeremy [name] MRN: [id number] Date of Birth: 07/27/1956 Referring Provider: Meliisa O'Sullivan, NP   Encounter Date: 05/15/2018  PT End of Session - 05/15/18 0852    Visit Number  10    Number of Visits  12    Date for PT Re-Evaluation  05/24/18    Authorization Type  BCBS    Authorization - Number of Visits  50    PT Start Time  0841    PT Stop Time  0929    PT Time Calculation (min)  48 min    Activity Tolerance  Patient tolerated treatment well    Behavior During Therapy  [facility] for tasks assessed/performed       Past Medical History:  Diagnosis Date  . Anemia 11/16/2014  . Bladder disease    "an unusual bladder disease; don't know what it's called" (06/30/2015)  . Bulging lumbar disc   . GERD (gastroesophageal reflux disease)   . History of hiatal hernia   . Hyperglycemia 11/16/2014  . Hyperlipemia   . Hypertension   . Joint pain, knee   . Migraine    06/30/2015 "maybe 2-3 times/year; not as bad as I used to have them"  . OSA (obstructive sleep apnea)     Past Surgical History:  Procedure Laterality Date  . ESOPHAGOGASTRODUODENOSCOPY    . ESOPHAGOGASTRODUODENOSCOPY (EGD) WITH ESOPHAGEAL DILATION  ~ 2014  . KNEE ARTHROSCOPY Right 1995  . NASAL SEPTUM SURGERY  ~ 1972  . TOTAL KNEE ARTHROPLASTY  10/11/2011   Procedure: TOTAL KNEE ARTHROPLASTY;  Surgeon: Stephen D Lucey, MD;  [facility];  Service: Orthopedics;  Laterality: Right;  . 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)  ~2014    There were no vitals filed for this visit.  Subjective Assessment - 05/15/18 0849    Subjective  Pt. noting onset of L hip pain when climbing stairs on way into clinic today in same area that has ben bothering him.      How long can you sit  comfortably?  1 hr     How long can you stand comfortably?  1 hr    How long can you walk comfortably?  30 min     Diagnostic tests  Lumbar x-ray 03/03/18 noted: Xray of the lumbar spine noted : There is moderately severe disc space narrowing at L2-3. There is mild disc space narrowing at L3-4. Other disc spaces appear unremarkable. There are anterior osteophytes at L3, L4, and L5. There is facet osteoarthritic change at L5-S1 bilaterally.    Patient Stated Goals  "painless so I can get back to work"    Currently in Pain?  Yes    Pain Score  3     Pain Location  Hip    Pain Orientation  Left    Pain Descriptors / Indicators  Aching;Tightness    Pain Type  Acute pain    Pain Onset  More than a month ago    Pain Frequency  Intermittent    Aggravating Factors   climbing, bending    Pain Relieving Factors  heat and E-stim    Effect of Pain on Daily Activities  avoids bending over     Multiple Pain Sites  No         [facility] PT Assessment -   05/15/18 0906      Observation/Other Assessments   Focus on Therapeutic Outcomes (FOTO)   53% (47% limitation)      Strength   Right/Left Hip  Right;Left    Right Hip Flexion  5/5    Right Hip Extension  4/5    Right Hip External Rotation   4+/5    Right Hip Internal Rotation  4+/5    Right Hip ABduction  4+/5    Right Hip ADduction  4+/5    Left Hip Flexion  5/5    Left Hip Extension  4/5    Left Hip External Rotation  4+/5    Left Hip Internal Rotation  4+/5    Left Hip ABduction  4+/5    Left Hip ADduction  4+/5    Right/Left Knee  Right;Left    Right Knee Flexion  5/5    Right Knee Extension  5/5    Left Knee Flexion  5/5    Left Knee Extension  5/5                   [facility] Adult PT Treatment/Exercise - 05/15/18 0913      Therapeutic Activites    Therapeutic Activities  Work Simulation    Work Simulation  Worked on pivotting sled loaded with 55# wt. side stepping and pivotting sled down hallway as pt. 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 (said he has to do this sometimes as he cannot find a handtruck) x 30 ft as this distance is distance at work drums need to be moved 20-30x/day       Lumbar Exercises: Stretches   Hip Flexor Stretch  Right;Left;1 rep;30 seconds    Hip Flexor Stretch Limitations  on chair lunge position       Lumbar Exercises: Aerobic   Recumbent Bike  Lvl 2, 7 min       Lumbar Exercises: Standing   Functional Squats  15 reps;3 seconds    Functional Squats Limitations  TRX - 3" hold       Lumbar Exercises: Sidelying   Clam  15 reps;Left;3 seconds    Clam Limitations  green TB      Knee/Hip Exercises: Standing   Hip Extension  Right;Left;Stengthening;10 reps    Extension Limitations  with red looped TB at ankles      Manual Therapy   Manual Therapy  Soft tissue mobilization;Myofascial release    Manual therapy comments  sidelying with L LE elevated on bolster     Soft tissue mobilization  STM to L superior glutes/piriformis              PT Education - 05/15/18 1256    Education Details  HEP update    Person(s) Educated  Patient    Methods  Explanation;Demonstration;Verbal cues;Handout    Comprehension  Verbalized understanding;Returned demonstration;Verbal cues required;Need further instruction       PT Short Term Goals - 05/04/18 1049      PT SHORT TERM GOAL #1   Title  Independent with initial HEP    Status  Achieved      PT SHORT TERM GOAL #2   Title  Pt will verbalize/demonstrate understanding of neutral spine and proper body mechanics to prevent/reduce LBP with daily tasks    Status  Achieved        PT Long Term Goals - 05/15/18 0911      PT LONG TERM GOAL #1     Title  independent with ongoing/advanced HEP     Period  Weeks    Status  Partially Met      PT LONG TERM GOAL #2   Title  Verbalize understanding of proper posture/body mechanics with work-simualted tasks to reduce risk of reinjury     Time  6     Period  Weeks    Status  Partially Met      PT LONG TERM GOAL #3   Title  Report ability to perform work tasks without increase in LBP    Time  6    Period  Weeks    Status  On-going      PT LONG TERM GOAL #4   Title  Proximal LE strength >/= 4+/5 for improved core stability    Status  Partially Met            Plan - 05/15/18 0851    Clinical Impression Statement  Pt. noting ~ 50% relief from pain since starting therapy.  B hip extension strength still 4/5 strength however pt. met proximal strength goal in other muscle groups with MMT.  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  Progressing well toward goals.  May benefit from further DN to address ongoing L superior buttocks tone/ttp as limited response to manual therapy in this area.    PT Treatment/Interventions  Patient/family education;Neuromuscular re-education;Therapeutic exercise;Therapeutic activities;Functional mobility training;ADLs/Self Care Home Management;Moist Heat;Electrical Stimulation;Iontophoresis 4mg/ml Dexamethasone;Ultrasound;Traction;Manual techniques;Dry needling;Taping    Consulted and Agree with Plan of Care  Patient       Patient will benefit from skilled therapeutic intervention in order to improve the following deficits and impairments:  Pain, Increased muscle spasms, Impaired flexibility, Decreased range of motion, Decreased strength, Decreased activity tolerance, Difficulty walking, Postural dysfunction, Improper body mechanics  Visit Diagnosis: Acute bilateral low back pain with left-sided sciatica  Muscle spasm of back  Other muscle spasm  Abnormal posture     Problem List Patient Active Problem List   Diagnosis Date Noted  . Morbid obesity ([facility]) 03/07/2018  . Dyspnea 10/22/2017  . Acute on chronic diastolic CHF (congestive heart failure) ([facility])   . Bronchospasm,  acute 10/21/2017  . Hyperlipidemia   . Chronic diastolic heart failure ([facility])   . Atypical chest pain 06/30/2015  . OSA (obstructive sleep apnea) 02/10/2015  . GERD (gastroesophageal reflux disease) 02/10/2015  . Anxiety and depression 02/10/2015  . Anemia 11/16/2014  . Preventative health care 11/16/2014  . Hyperglycemia 11/16/2014  . HTN (hypertension) 10/10/2014  . Allergic rhinitis 10/10/2014     , PTA 05/15/18 1:10 PM   [facility] [place] [address]  [address] [place], [place], [address] Phone: [phone]   Fax:  [phone]  Name: Aylan S Tews MRN: [id number] Date of Birth: 12/21/1955   

## 2018-05-16 NOTE — Telephone Encounter (Signed)
Pt called to notify the disability paperwork deadline is May 23, 2018.

## 2018-05-18 ENCOUNTER — Encounter: Payer: Self-pay | Admitting: Physical Therapy

## 2018-05-18 ENCOUNTER — Ambulatory Visit: Payer: BLUE CROSS/BLUE SHIELD | Admitting: Physical Therapy

## 2018-05-18 DIAGNOSIS — M5442 Lumbago with sciatica, left side: Secondary | ICD-10-CM | POA: Diagnosis not present

## 2018-05-18 DIAGNOSIS — M62830 Muscle spasm of back: Secondary | ICD-10-CM | POA: Diagnosis not present

## 2018-05-18 DIAGNOSIS — M62838 Other muscle spasm: Secondary | ICD-10-CM

## 2018-05-18 DIAGNOSIS — R293 Abnormal posture: Secondary | ICD-10-CM

## 2018-05-18 NOTE — Therapy (Signed)
[facility] [address]  [place], [place], [address] Phone: [phone]   Fax:  [phone]  Physical Therapy Treatment  Patient Details  Name: Jeremy [name] MRN: [id number] Date of Birth: 1956-06-29 Referring Provider (PT): Judi Saa, NP   Encounter Date: 05/18/2018  PT End of Session - 05/18/18 0850    Visit Number  11    Number of Visits  12    Date for PT Re-Evaluation  05/24/18    Authorization Type  BCBS    Authorization - Number of Visits  69    PT Start Time  0850    PT Stop Time  0946    PT Time Calculation (min)  56 min    Activity Tolerance  Patient tolerated treatment well    Behavior During Therapy  [facility] for tasks assessed/performed       Past Medical History:  Diagnosis Date  . Anemia 11/16/2014  . Bladder disease    "an unusual bladder disease; don't know what it's called" (06/30/2015)  . Bulging lumbar disc   . GERD (gastroesophageal reflux disease)   . History of hiatal hernia   . Hyperglycemia 11/16/2014  . Hyperlipemia   . Hypertension   . Joint pain, knee   . Migraine    06/30/2015 "maybe 2-3 times/year; not as bad as I used to have them"  . OSA (obstructive sleep apnea)     Past Surgical History:  Procedure Laterality Date  . ESOPHAGOGASTRODUODENOSCOPY    . ESOPHAGOGASTRODUODENOSCOPY (EGD) WITH ESOPHAGEAL DILATION  ~ 2014  . KNEE ARTHROSCOPY Right 1995  . NASAL SEPTUM SURGERY  ~ 1972  . TOTAL KNEE ARTHROPLASTY  10/11/2011   Procedure: TOTAL KNEE ARTHROPLASTY;  Surgeon: Rudean Haskell, MD;  Location: [place];  Service: Orthopedics;  Laterality: Right;  . 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)  ~2014    There were no vitals filed for this visit.  Subjective Assessment - 05/18/18 0854    Subjective  Pt reporting pain is worse today. States he was doing the chains for a football game last night so he was standing a lot, but otherwise no aware of  any triggering event.    Diagnostic tests  Lumbar x-ray 03/03/18 noted: Xray of the lumbar spine noted : There is moderately severe disc space narrowing at L2-3. There is mild disc space narrowing at L3-4. Other disc spaces appear unremarkable. There are anterior osteophytes at L3, L4, and L5. There is facet osteoarthritic change at L5-S1 bilaterally.    Patient Stated Goals  "painless so I can get back to work"    Currently in Pain?  Yes    Pain Score  --   5-6/10   Pain Location  Back   & hip/buttock   Pain Orientation  Left;Right;Lower    Pain Descriptors / Indicators  Dull    Pain Type  Acute pain    Pain Frequency  Intermittent                       [facility] Adult PT Treatment/Exercise - 05/18/18 0850      Exercises   Exercises  Lumbar      Lumbar Exercises: Aerobic   Nustep  L5 x 6 min (LE only)      Modalities   Modalities  Electrical Stimulation;Moist Heat      Moist Heat Therapy   Number Minutes Moist Heat  15 Minutes  Moist Heat Location  Lumbar Spine;Hip      Electrical Stimulation   Electrical Stimulation Location  lumbar paraspinals/ Lt superior & lateral glute    Electrical Stimulation Action  IFC    Electrical Stimulation Parameters  80-150 Hz, intenisty to tolerance x 15'    Electrical Stimulation Goals  Pain;Tone      Manual Therapy   Manual Therapy  Soft tissue mobilization;Myofascial release    Manual therapy comments  sidelying with L LE elevated on bolster     Soft tissue mobilization  STM to B lumbar parapinals, L glutes & piriformis    Myofascial Release  manual TPR to L glute mininus/medius & piriformis       Trigger Point Dry Needling - 05/18/18 0850    Consent Given?  Yes    Muscles Treated Upper Body  Longissimus    & B multifidi   Muscles Treated Lower Body  Gluteus minimus;Gluteus maximus;Piriformis   Left   Longissimus Response  Twitch response elicited;Palpable increased muscle length    Gluteus Maximus Response  Twitch  response elicited;Palpable increased muscle length    Gluteus Minimus Response  Twitch response elicited;Palpable increased muscle length    Piriformis Response  Twitch response elicited;Palpable increased muscle length             PT Short Term Goals - 05/04/18 1049      PT SHORT TERM GOAL #1   Title  Independent with initial HEP    Status  Achieved      PT SHORT TERM GOAL #2   Title  Pt will verbalize/demonstrate understanding of neutral spine and proper body mechanics to prevent/reduce LBP with daily tasks    Status  Achieved        PT Long Term Goals - 05/15/18 0911      PT LONG TERM GOAL #1   Title  independent with ongoing/advanced HEP     Period  Weeks    Status  Partially Met      PT LONG TERM GOAL #2   Title  Verbalize understanding of proper posture/body mechanics with work-simualted tasks to reduce risk of reinjury     Time  6    Period  Weeks    Status  Partially Met      PT LONG TERM GOAL #3   Title  Report ability to perform work tasks without increase in LBP    Time  6    Period  Weeks    Status  On-going      PT LONG TERM GOAL #4   Title  Proximal LE strength >/= 4+/5 for improved core stability    Status  Partially Met            Plan - 05/18/18 0858    Clinical Impression Statement  Tim reporting increased LBP and L hip/buttock pain after prolonged standing  while tending the chains at a football game last night. Increased muscle tension noted in lumbar paraspinals as well as ongoing L glute/piriformis tightness evident - addressed with manual therapy including DN followed by estim and moist heat with good relief of tension and pain noted. One visit remaining in current POC, by pt may benefit from recert pending long term response to today's visit and status on next visit.    Rehab Potential  Good    PT Treatment/Interventions  Patient/family education;Neuromuscular re-education;Therapeutic exercise;Therapeutic activities;Functional mobility  training;ADLs/Self Care Home Management;Moist Heat;Electrical Stimulation;Iontophoresis 66m/ml Dexamethasone;Ultrasound;Traction;Manual techniques;Dry needling;Taping    PT Next  Visit Plan  recert?    Consulted and Agree with Plan of Care  Patient       Patient will benefit from skilled therapeutic intervention in order to improve the following deficits and impairments:  Pain, Increased muscle spasms, Impaired flexibility, Decreased range of motion, Decreased strength, Decreased activity tolerance, Difficulty walking, Postural dysfunction, Improper body mechanics  Visit Diagnosis: Acute bilateral low back pain with left-sided sciatica  Muscle spasm of back  Other muscle spasm  Abnormal posture     Problem List Patient Active Problem List   Diagnosis Date Noted  . Morbid obesity ([place]) 03/07/2018  . Dyspnea 10/22/2017  . Acute on chronic diastolic CHF (congestive heart failure) ([place])   . Bronchospasm, acute 10/21/2017  . Hyperlipidemia   . Chronic diastolic heart failure ([place])   . Atypical chest pain 06/30/2015  . OSA (obstructive sleep apnea) 02/10/2015  . GERD (gastroesophageal reflux disease) 02/10/2015  . Anxiety and depression 02/10/2015  . Anemia 11/16/2014  . Preventative health care 11/16/2014  . Hyperglycemia 11/16/2014  . HTN (hypertension) 10/10/2014  . Allergic rhinitis 10/10/2014    Percival Spanish, PT, MPT 05/18/2018, 12:42 PM  [facility] [address]  [place], [place], [address] Phone: [phone]   Fax:  [phone]  Name: Jeremy [name] MRN: [id number] Date of Birth: 24-Dec-1955

## 2018-05-19 ENCOUNTER — Ambulatory Visit: Payer: BLUE CROSS/BLUE SHIELD | Admitting: Family

## 2018-05-22 ENCOUNTER — Ambulatory Visit (INDEPENDENT_AMBULATORY_CARE_PROVIDER_SITE_OTHER): Payer: BLUE CROSS/BLUE SHIELD | Admitting: Family

## 2018-05-22 ENCOUNTER — Ambulatory Visit: Payer: BLUE CROSS/BLUE SHIELD

## 2018-05-22 ENCOUNTER — Encounter: Payer: Self-pay | Admitting: Family

## 2018-05-22 VITALS — BP 138/86 | HR 63 | Temp 98.0°F | Resp 16 | Ht 67.0 in | Wt 254.0 lb

## 2018-05-22 DIAGNOSIS — M62830 Muscle spasm of back: Secondary | ICD-10-CM | POA: Diagnosis not present

## 2018-05-22 DIAGNOSIS — M25552 Pain in left hip: Secondary | ICD-10-CM | POA: Diagnosis not present

## 2018-05-22 DIAGNOSIS — M62838 Other muscle spasm: Secondary | ICD-10-CM | POA: Diagnosis not present

## 2018-05-22 DIAGNOSIS — R293 Abnormal posture: Secondary | ICD-10-CM

## 2018-05-22 DIAGNOSIS — M5416 Radiculopathy, lumbar region: Secondary | ICD-10-CM | POA: Diagnosis not present

## 2018-05-22 DIAGNOSIS — M5442 Lumbago with sciatica, left side: Secondary | ICD-10-CM | POA: Diagnosis not present

## 2018-05-22 NOTE — Patient Instructions (Signed)
You should be contacted about scheduling your appointment with Dr. Barbaraann Barthel (sports medicine) and about your MRI.

## 2018-05-22 NOTE — Progress Notes (Signed)
Subjective:    [name], male    DOB: July 30, 1956, 62 y.o.   MRN: [id number]  HPI  Mr. Dumond is a 62 yr old male who presents today for follow up of his low back pain. Reports that initial injury occurred in June. He was working with his employer before he came to our office for this injury. He has been doing PT since 8/21. He continues to have back pain despite PT. Pain is worse with bending, lifting.  Pain sometimes radiates down the left leg and is associated with some numbness/tingling. Also has pain in the left hip.    Review of Systems    see HPI  Past Medical History:  Diagnosis Date  . Anemia 11/16/2014  . Bladder disease    "an unusual bladder disease; don't know what it's called" (06/30/2015)  . Bulging lumbar disc   . GERD (gastroesophageal reflux disease)   . History of hiatal hernia   . Hyperglycemia 11/16/2014  . Hyperlipemia   . Hypertension   . Joint pain, knee   . Migraine    06/30/2015 "maybe 2-3 times/year; not as bad as I used to have them"  . OSA (obstructive sleep apnea)      Social History   Socioeconomic History  . Marital status: Married    Spouse name: Not on file  . Number of children: 3  . Years of education: Not on file  . Highest education level: Not on file  Occupational History  . Not on file  Social Needs  . Financial resource strain: Not on file  . Food insecurity:    Worry: Not on file    Inability: Not on file  . Transportation needs:    Medical: Not on file    Non-medical: Not on file  Tobacco Use  . Smoking status: Former Smoker    Types: Cigarettes  . Smokeless tobacco: Never Used  . Tobacco comment: "stopped smoking in ~ 1990"  Substance and Sexual Activity  . Alcohol use: No    Alcohol/week: 0.0 standard drinks  . Drug use: No  . Sexual activity: Yes  Lifestyle  . Physical activity:    Days per week: Not on file    Minutes per session: Not on file  . Stress: Not on file  Relationships  . Social  connections:    Talks on phone: Not on file    Gets together: Not on file    Attends religious service: Not on file    Active member of club or organization: Not on file    Attends meetings of clubs or organizations: Not on file    Relationship status: Not on file  . Intimate partner violence:    Fear of current or ex partner: Not on file    Emotionally abused: Not on file    Physically abused: Not on file    Forced sexual activity: Not on file  Other Topics Concern  . 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 ESOPHAGOGASTRODUODENOSCOPY    . ESOPHAGOGASTRODUODENOSCOPY (EGD) WITH ESOPHAGEAL DILATION  ~ 2014  . KNEE ARTHROSCOPY Right 1995  . NASAL SEPTUM SURGERY  ~ 1972  . TOTAL KNEE ARTHROPLASTY  10/11/2011   Procedure: TOTAL KNEE ARTHROPLASTY;  Surgeon: Annie Main  Glenda Chroman, MD;  Location: [place];  Service: Orthopedics;  Laterality: Right;  . 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 Stroke Mother 8       Died of CVA  . Hypertension Mother   . Anesthesia problems Neg Hx   . Hypotension Neg Hx   . Malignant hyperthermia Neg Hx   . Pseudochol deficiency Neg Hx     No Known Allergies  Current Outpatient Medications on File Prior to Visit  Medication Sig Dispense Refill  . albuterol (PROVENTIL HFA;VENTOLIN HFA) 108 (90 Base) MCG/ACT inhaler Inhale 2 puffs into the lungs every 6 (six) hours as needed for wheezing or shortness of breath. 1 Inhaler 2  . albuterol (PROVENTIL) (2.5 MG/3ML) 0.083% nebulizer solution Take 3 mLs (2.5 mg total) by nebulization every 6 (six) hours as needed for wheezing or shortness of breath. 150 mL 1  . ALPRAZolam (XANAX) 0.5 MG tablet Take 0.5 tablets (0.25 mg total) by mouth 2 (two) times daily as  needed for anxiety. 30 tablet 0  . amLODipine-benazepril (LOTREL) 10-40 MG capsule Take 1 capsule by mouth daily. 90 capsule 1  . ARIPiprazole (ABILIFY) 15 MG tablet Take 0.5 tablets (7.5 mg total) by mouth daily. 30 tablet 0  . b complex vitamins tablet Take 1 tablet by mouth daily.    . benzonatate (TESSALON) 100 MG capsule Take 1 capsule (100 mg total) by mouth 3 (three) times daily as needed for cough. 30 capsule 0  . cyclobenzaprine (FLEXERIL) 10 MG tablet Take 1 tablet by mouth as directed.  0  . doxycycline (VIBRA-TABS) 100 MG tablet Take 1 tablet (100 mg total) by mouth 2 (two) times daily. 20 tablet 0  . ferrous sulfate 325 (65 FE) MG tablet Take 1 tablet (325 mg total) by mouth 2 (two) times daily with a meal. 60 tablet 0  . fexofenadine (ALLEGRA) 180 MG tablet Take 1 tablet (180 mg total) by mouth daily. 30 tablet 0  . furosemide (LASIX) 20 MG tablet TAKE 1 TABLET DAILY 90 tablet 1  . hydrochlorothiazide (HYDRODIURIL) 25 MG tablet TAKE 1 TABLET DAILY 90 tablet 1  . meloxicam (MOBIC) 7.5 MG tablet Take 1 tablet (7.5 mg total) by mouth daily. 30 tablet 0  . metoprolol succinate (TOPROL-XL) 50 MG 24 hr tablet Take 1 tablet (50 mg total) by mouth daily. Take with or immediately following a meal. 30 tablet 3  . Multiple Vitamin (MULTIVITAMIN WITH MINERALS) TABS tablet Take 1 tablet by mouth daily.    . Nebulizers (VIOS AEROSOL DELIVERY SYSTEM) MISC 1 each as directed.  0  . omeprazole (PRILOSEC) 40 MG capsule Take 1 capsule (40 mg total) by mouth daily. 90 capsule 0  . potassium chloride (K-DUR,KLOR-CON) 10 MEQ tablet Take 1 tablet (10 mEq total) by mouth daily. 30 tablet 0  . sertraline (ZOLOFT) 100 MG tablet Take 1.5 tablets (150 mg total) by mouth daily. 30 tablet 0  . sildenafil (VIAGRA) 100 MG tablet Take 1 tablet (100 mg total) by mouth daily as needed for erectile dysfunction. 10 tablet 2   No current facility-administered medications on file prior to visit.     BP 138/86 (BP  Location: Right Arm, Patient Position: Sitting, Cuff Size: Large)   Pulse 63   Temp 98 F (36.7 C) (Oral)   Resp 16   Ht 5\' 7"  (1.702 m)   Wt 254 lb (115.2 kg)  SpO2 95%   BMI 39.78 kg/m    Objective:   Physical Exam  Constitutional: He is oriented to person, place, and time. He appears well-developed and well-nourished. No distress.  HENT:  Head: Normocephalic and atraumatic.  Cardiovascular: Normal rate and regular rhythm.  No murmur heard. Pulmonary/Chest: Effort normal and breath sounds normal. No respiratory distress. He has no wheezes. He has no rales.  Musculoskeletal: He exhibits no edema.       Cervical back: He exhibits no tenderness.       Thoracic back: He exhibits no tenderness.       Lumbar back: He exhibits no tenderness.  Neurological: He is alert and oriented to person, place, and time.  Reflex Scores:      Patellar reflexes are 3+ on the right side and 2+ on the left side. Bilateral LE strength is 5/5 + low back pain and left hip pain with left sided straight leg raise  Skin: Skin is warm and dry.  Psychiatric: He has a normal mood and affect. His behavior is normal. Thought content normal.          Assessment & Plan:  Lumbar radiculopathy/left hip pain- given duration of symptoms and lack of improvement with conservative measures will refer for MRI of the lumbar spine. Will also refer to sports med for further evaluation of the left hip pain. I do not feel that he is stable to return to work at this time.  I have written him out of work until 06/06/18.

## 2018-05-22 NOTE — Therapy (Addendum)
[facility] [address]  [place], [place], [address] Phone: [phone]   Fax:  [phone]  Physical Therapy Treatment / Discharge Summary  Patient Details  Name: Jeremy [name] MRN: [id number] Date of Birth: 01/04/1956 Referring Provider (PT): Judi Saa, NP   Encounter Date: 05/22/2018  PT End of Session - 05/22/18 0934    Visit Number  12    Number of Visits  12    Date for PT Re-Evaluation  05/24/18    Authorization Type  BCBS    Authorization - Number of Visits  50    PT Start Time  0931    PT Stop Time  1015    PT Time Calculation (min)  44 min    Activity Tolerance  Patient tolerated treatment well    Behavior During Therapy  [facility] for tasks assessed/performed       Past Medical History:  Diagnosis Date  . Anemia 11/16/2014  . Bladder disease    "an unusual bladder disease; don't know what it's called" (06/30/2015)  . Bulging lumbar disc   . GERD (gastroesophageal reflux disease)   . History of hiatal hernia   . Hyperglycemia 11/16/2014  . Hyperlipemia   . Hypertension   . Joint pain, knee   . Migraine    06/30/2015 "maybe 2-3 times/year; not as bad as I used to have them"  . OSA (obstructive sleep apnea)     Past Surgical History:  Procedure Laterality Date  . ESOPHAGOGASTRODUODENOSCOPY    . ESOPHAGOGASTRODUODENOSCOPY (EGD) WITH ESOPHAGEAL DILATION  ~ 2014  . KNEE ARTHROSCOPY Right 1995  . NASAL SEPTUM SURGERY  ~ 1972  . TOTAL KNEE ARTHROPLASTY  10/11/2011   Procedure: TOTAL KNEE ARTHROPLASTY;  Surgeon: Rudean Haskell, MD;  Location: [place];  Service: Orthopedics;  Laterality: Right;  . 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)  ~2014    There were no vitals filed for this visit.  Subjective Assessment - 05/22/18 0947    Subjective  Pt. noting he lifted an AC unit from floor on Saturday morning and has had increased L hip pain Starting Sunday morning and since  then.  Sees MD later today and wishes to go on hold from therapy as he has gotten decreased relief from pain levels over last few visits.      Diagnostic tests  Lumbar x-ray 03/03/18 noted: Xray of the lumbar spine noted : There is moderately severe disc space narrowing at L2-3. There is mild disc space narrowing at L3-4. Other disc spaces appear unremarkable. There are anterior osteophytes at L3, L4, and L5. There is facet osteoarthritic change at L5-S1 bilaterally.    Patient Stated Goals  "painless so I can get back to work"    Currently in Pain?  Yes    Pain Score  5     Pain Location  Back   and L buttocks   Pain Orientation  Left    Pain Descriptors / Indicators  Dull    Pain Type  Acute pain    Pain Frequency  Intermittent    Aggravating Factors   climbing, bending    Pain Relieving Factors  meds    Multiple Pain Sites  No         [facility] PT Assessment - 05/22/18 0941      Observation/Other Assessments   Focus on Therapeutic Outcomes (FOTO)   51% (49% limitation)      Strength  Right/Left Hip  Right;Left    Right Hip Flexion  5/5    Right Hip Extension  4/5    Right Hip External Rotation   4+/5    Right Hip Internal Rotation  4+/5    Right Hip ABduction  5/5    Right Hip ADduction  4+/5    Left Hip Flexion  5/5    Left Hip Extension  4+/5    Left Hip External Rotation  4+/5    Left Hip Internal Rotation  4+/5    Left Hip ABduction  5/5    Left Hip ADduction  4+/5    Right/Left Knee  Right;Left    Right Knee Flexion  5/5    Right Knee Extension  5/5    Left Knee Flexion  5/5    Left Knee Extension  5/5                   [facility] Adult PT Treatment/Exercise - 05/22/18 0958      Therapeutic Activites    Therapeutic Activities  Work Simulation    Work [employer]  Worked on [employer] loaded with 65# wt. side stepping and pivotting sled down hallway as pt. 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 (said he has to do  this sometimes as he cannot find a handtruck) x 30 ft as this distance is distance at work drums need to be moved 20-30x/day;  Focused on abdominal bracing and keeping sled close to BOS to reduce lumbar strain       Lumbar Exercises: Stretches   Piriformis Stretch  Left;3 reps;30 seconds    Piriformis Stretch Limitations  KTOS, Figure-4 with towel       Lumbar Exercises: Aerobic   Recumbent Bike  Lvl 2, 7 min       Lumbar Exercises: Supine   Ab Set  15 reps;5 seconds      Moist Heat Therapy   Number Minutes Moist Heat  15 Minutes    Moist Heat Location  Lumbar Spine;Hip      Electrical Stimulation   Electrical Stimulation Location  lumbar paraspinals/ Lt superior & lateral glute    Electrical Stimulation Action  IFC    Electrical Stimulation Parameters  to tolerance, 15'    Electrical Stimulation Goals  Pain;Tone               PT Short Term Goals - 05/04/18 1049      PT SHORT TERM GOAL #1   Title  Independent with initial HEP    Status  Achieved      PT SHORT TERM GOAL #2   Title  Pt will verbalize/demonstrate understanding of neutral spine and proper body mechanics to prevent/reduce LBP with daily tasks    Status  Achieved        PT Long Term Goals - 05/22/18 0951      PT LONG TERM GOAL #1   Title  independent with ongoing/advanced HEP     Period  Weeks    Status  Partially Met      PT LONG TERM GOAL #2   Title  Verbalize understanding of proper posture/body mechanics with work-simualted tasks to reduce risk of reinjury     Time  6    Period  Weeks    Status  Partially Met      PT LONG TERM GOAL #3   Title  Report ability to perform work tasks without increase in  LBP    Time  6    Period  Weeks    Status  On-going      PT LONG TERM GOAL #4   Title  Proximal LE strength >/= 4+/5 for improved core stability    Status  Partially Met            Plan - 05/22/18 0936    Clinical Impression Statement  Wishes to go on hold from therapy as he notes  he has had limited relief from pain levels over last few visits and is on last day of current POC today.  Pt. noting 50% improvement in pain since starting therapy.  Pt. demonstrating improvement in LE strength goal today now grossly 4+/5-5/5 LE strength with exception of L hip extension still 4/5.  Pt. reporting increased hip/back pain with work simulation activities in session today (see flow sheet) which limited his participation.  Pt. expressing concern today regarding scheduled return to work tomorrow and wishes to see an ortho MD regarding hip/back pain wanting to go on 30-day hold from therapy.  Suepervising PT approving this plan with pt. now on 30-day hold from therapy.      PT Treatment/Interventions  Patient/family education;Neuromuscular re-education;Therapeutic exercise;Therapeutic activities;Functional mobility training;ADLs/Self Care Home Management;Moist Heat;Electrical Stimulation;Iontophoresis 62m/ml Dexamethasone;Ultrasound;Traction;Manual techniques;Dry needling;Taping    PT Next Visit Plan  30-day hold    Consulted and Agree with Plan of Care  Patient       Patient will benefit from skilled therapeutic intervention in order to improve the following deficits and impairments:  Pain, Increased muscle spasms, Impaired flexibility, Decreased range of motion, Decreased strength, Decreased activity tolerance, Difficulty walking, Postural dysfunction, Improper body mechanics  Visit Diagnosis: Acute bilateral low back pain with left-sided sciatica  Muscle spasm of back  Other muscle spasm  Abnormal posture     Problem List Patient Active Problem List   Diagnosis Date Noted  . Morbid obesity ([place] 03/07/2018  . Dyspnea 10/22/2017  . Acute on chronic diastolic CHF (congestive heart failure) ([place]   . Bronchospasm, acute 10/21/2017  . Hyperlipidemia   . Chronic diastolic heart failure ([place]   . Atypical chest pain 06/30/2015  . OSA (obstructive sleep apnea) 02/10/2015  . GERD  (gastroesophageal reflux disease) 02/10/2015  . Anxiety and depression 02/10/2015  . Anemia 11/16/2014  . Preventative health care 11/16/2014  . Hyperglycemia 11/16/2014  . HTN (hypertension) 10/10/2014  . Allergic rhinitis 10/10/2014    MBess Harvest PTA 05/22/18 11:13 AM   [facility] [address] [place] [address]Phone: [phone]  Fax:  [phone] Name: Jeremy CREMERMRN: [id number] of Birth: 407-10-1955 PHYSICAL THERAPY DISCHARGE SUMMARY  Visits from Start of Care: 12  Current functional level related to goals / functional outcomes:   Refer to above clinical impression for status as of last visit on 05/22/18. Pt was placed on hold for 30 days and has not needed to return to PT, therefore will proceed with discharge from PT for this episode.   Remaining deficits:   As above.   Education / Equipment:   HEP  Plan: Patient agrees to discharge.  Patient goals were partially met. Patient is being discharged due to the patient's request.  ?????     JPercival Spanish PT, MPT 07/25/18, 8:16 AM  [facility] [address]Phone: [phone]  Fax:  [phone]

## 2018-05-23 ENCOUNTER — Ambulatory Visit: Payer: BLUE CROSS/BLUE SHIELD | Admitting: Family

## 2018-05-24 ENCOUNTER — Ambulatory Visit: Payer: BLUE CROSS/BLUE SHIELD | Admitting: Physical Therapy

## 2018-05-24 ENCOUNTER — Telehealth: Payer: Self-pay | Admitting: *Deleted

## 2018-05-24 NOTE — Telephone Encounter (Signed)
Received request for Medical records of "all office visit notes from the 05/22/18 OV"; forwarded via fax to sender/SLS 10/02

## 2018-05-25 ENCOUNTER — Encounter: Payer: BLUE CROSS/BLUE SHIELD | Admitting: Physical Therapy

## 2018-05-25 DIAGNOSIS — G4733 Obstructive sleep apnea (adult) (pediatric): Secondary | ICD-10-CM | POA: Diagnosis not present

## 2018-06-03 ENCOUNTER — Ambulatory Visit (HOSPITAL_BASED_OUTPATIENT_CLINIC_OR_DEPARTMENT_OTHER)
Admission: RE | Admit: 2018-06-03 | Discharge: 2018-06-03 | Disposition: A | Discharge status: Home / Self Care | Payer: BLUE CROSS/BLUE SHIELD | Source: Ambulatory Visit | Attending: Family | Admitting: Family

## 2018-06-03 DIAGNOSIS — M48061 Spinal stenosis, lumbar region without neurogenic claudication: Secondary | ICD-10-CM | POA: Insufficient documentation

## 2018-06-03 DIAGNOSIS — M5126 Other intervertebral disc displacement, lumbar region: Secondary | ICD-10-CM | POA: Insufficient documentation

## 2018-06-03 DIAGNOSIS — M4317 Spondylolisthesis, lumbosacral region: Secondary | ICD-10-CM | POA: Diagnosis not present

## 2018-06-03 DIAGNOSIS — M5416 Radiculopathy, lumbar region: Secondary | ICD-10-CM

## 2018-06-03 DIAGNOSIS — M545 Low back pain: Secondary | ICD-10-CM | POA: Diagnosis not present

## 2018-06-03 DIAGNOSIS — S3992XA Unspecified injury of lower back, initial encounter: Secondary | ICD-10-CM | POA: Diagnosis not present

## 2018-06-05 ENCOUNTER — Encounter: Payer: Self-pay | Admitting: Family

## 2018-06-05 ENCOUNTER — Ambulatory Visit (INDEPENDENT_AMBULATORY_CARE_PROVIDER_SITE_OTHER): Payer: BLUE CROSS/BLUE SHIELD | Admitting: Family

## 2018-06-05 VITALS — BP 134/82 | HR 61 | Temp 97.8°F | Resp 16 | Ht 67.0 in | Wt 252.0 lb

## 2018-06-05 DIAGNOSIS — M545 Low back pain: Secondary | ICD-10-CM | POA: Diagnosis not present

## 2018-06-05 DIAGNOSIS — M25552 Pain in left hip: Secondary | ICD-10-CM

## 2018-06-05 DIAGNOSIS — G8929 Other chronic pain: Secondary | ICD-10-CM

## 2018-06-05 NOTE — Patient Instructions (Signed)
You should be contacted about your referral to neurosurgery and sports medicine. Remain out of work until further recommendations from neurosurgery.

## 2018-06-05 NOTE — Progress Notes (Signed)
Subjective:    [name], male    DOB: [birth year], 62 y.o.   MRN: [id number]  HPI  Patient is a 62 yr old male who presents today for follow up of his back pain.  He remains out of work on short-term disability due to his back pain.  Since his last visit he underwent an MRI of the lumbar spine.  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  2 mm anterolisthesis L5-S1 secondary to BILATERAL L5 pars defects. Uncovering of the disc with central protrusion extending to both foramina. BILATERAL L5 neural impingement is noted.  He has a fairly physical job at work.  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  They do have a drum truck but states that it is often unavailable to him and as a result he has to physically roll the barrels himself.  He does not feel that he is capable of returning to this job at this time due to his ongoing back pain and physical demands of his job.  He is on short term disability.     Review of Systems See HPI  Past Medical History:  Diagnosis Date  . Anemia 11/16/2014  . Bladder disease    "an unusual bladder disease; don't know what it's called" (06/30/2015)  . Bulging lumbar disc   . GERD (gastroesophageal reflux disease)   . History of hiatal hernia   . Hyperglycemia 11/16/2014  . Hyperlipemia   . Hypertension   . Joint pain, knee   . Migraine    06/30/2015 "maybe 2-3 times/year; not as bad as I used to have them"  . OSA (obstructive sleep apnea)      Social History   Socioeconomic History  . Marital status: Married    Spouse name: Not on file  . Number of children: 3  . Years of education: Not on file  . Highest education level: Not on file  Occupational History  . Not on file  Social Needs  . Financial resource strain: Not on file  . Food insecurity:    Worry: Not on file    Inability: Not on file  . Transportation needs:      Medical: Not on file    Non-medical: Not on file  Tobacco Use  . Smoking status: Former Smoker    Types: Cigarettes  . Smokeless tobacco: Never Used  . Tobacco comment: "stopped smoking in ~ 1990"  Substance and Sexual Activity  . Alcohol use: No    Alcohol/week: 0.0 standard drinks  . Drug use: No  . Sexual activity: Yes  Lifestyle  . Physical activity:    Days per week: Not on file    Minutes per session: Not on file  . Stress: Not on file  Relationships  . Social connections:    Talks on phone: Not on file    Gets together: Not on file    Attends religious service: Not on file    Active member of club or organization: Not on file    Attends meetings of clubs or organizations: Not on file    Relationship status: Not on file  . Intimate partner violence:    Fear of current or ex partner: Not on file    Emotionally abused: Not on file    Physically abused: Not on file    Forced sexual activity: Not on file  Other Topics Concern  . 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 ESOPHAGOGASTRODUODENOSCOPY    . ESOPHAGOGASTRODUODENOSCOPY (EGD) WITH ESOPHAGEAL DILATION  ~ 2014  . KNEE ARTHROSCOPY Right 1995  . NASAL SEPTUM SURGERY  ~ 1972  . TOTAL KNEE ARTHROPLASTY  10/11/2011   Procedure: TOTAL KNEE ARTHROPLASTY;  Surgeon: Rudean Haskell, MD;  Location: [facility];  Service: Orthopedics;  Laterality: Right;  . 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 Stroke Mother 77       Died of CVA  . Hypertension Mother   . Anesthesia problems Neg Hx   . Hypotension Neg Hx   . Malignant hyperthermia Neg Hx   . Pseudochol deficiency Neg Hx     No Known Allergies  Current Outpatient Medications on File Prior to Visit   Medication Sig Dispense Refill  . albuterol (PROVENTIL HFA;VENTOLIN HFA) 108 (90 Base) MCG/ACT inhaler Inhale 2 puffs into the lungs every 6 (six) hours as needed for wheezing or shortness of breath. 1 Inhaler 2  . albuterol (PROVENTIL) (2.5 MG/3ML) 0.083% nebulizer solution Take 3 mLs (2.5 mg total) by nebulization every 6 (six) hours as needed for wheezing or shortness of breath. 150 mL 1  . ALPRAZolam (XANAX) 0.5 MG tablet Take 0.5 tablets (0.25 mg total) by mouth 2 (two) times daily as needed for anxiety. 30 tablet 0  . amLODipine-benazepril (LOTREL) 10-40 MG capsule Take 1 capsule by mouth daily. 90 capsule 1  . ARIPiprazole (ABILIFY) 15 MG tablet Take 0.5 tablets (7.5 mg total) by mouth daily. 30 tablet 0  . b complex vitamins tablet Take 1 tablet by mouth daily.    . benzonatate (TESSALON) 100 MG capsule Take 1 capsule (100 mg total) by mouth 3 (three) times daily as needed for cough. 30 capsule 0  . cyclobenzaprine (FLEXERIL) 10 MG tablet Take 1 tablet by mouth as directed.  0  . doxycycline (VIBRA-TABS) 100 MG tablet Take 1 tablet (100 mg total) by mouth 2 (two) times daily. 20 tablet 0  . ferrous sulfate 325 (65 FE) MG tablet Take 1 tablet (325 mg total) by mouth 2 (two) times daily with a meal. 60 tablet 0  . fexofenadine (ALLEGRA) 180 MG tablet Take 1 tablet (180 mg total) by mouth daily. 30 tablet 0  . furosemide (LASIX) 20 MG tablet TAKE 1 TABLET DAILY 90 tablet 1  . hydrochlorothiazide (HYDRODIURIL) 25 MG tablet TAKE 1 TABLET DAILY 90 tablet 1  . meloxicam (MOBIC) 7.5 MG tablet Take 1 tablet (7.5 mg total) by mouth daily. 30 tablet 0  . metoprolol succinate (TOPROL-XL) 50 MG 24 hr tablet Take 1 tablet (50 mg total) by mouth daily. Take with or immediately following a meal. 30 tablet 3  . Multiple Vitamin (MULTIVITAMIN WITH MINERALS) TABS tablet Take 1 tablet by mouth daily.    . Nebulizers (VIOS AEROSOL DELIVERY SYSTEM) MISC 1 each as directed.  0  . omeprazole (PRILOSEC) 40 MG  capsule Take 1 capsule (40 mg total) by mouth daily. 90 capsule 0  . potassium chloride (K-DUR,KLOR-CON) 10 MEQ tablet Take 1 tablet (10 mEq total) by mouth daily. 30 tablet 0  . sertraline (ZOLOFT) 100 MG  tablet Take 1.5 tablets (150 mg total) by mouth daily. 30 tablet 0  . sildenafil (VIAGRA) 100 MG tablet Take 1 tablet (100 mg total) by mouth daily as needed for erectile dysfunction. 10 tablet 2   No current facility-administered medications on file prior to visit.     BP 134/82 (BP Location: Right Arm, Patient Position: Sitting, Cuff Size: Large)   Pulse 61   Temp 97.8 F (36.6 C) (Oral)   Resp 16   Ht 5\' 7"  (1.702 m)   Wt 252 lb (114.3 kg)   SpO2 93%   BMI 39.47 kg/m       Objective:   Physical Exam  Constitutional: He is oriented to person, place, and time. He appears well-developed and well-nourished. No distress.  HENT:  Head: Normocephalic and atraumatic.  Cardiovascular: Normal rate and regular rhythm.  No murmur heard. Pulmonary/Chest: Effort normal and breath sounds normal. No respiratory distress. He has no wheezes. He has no rales.  Musculoskeletal: He exhibits no edema.  Neurological: He is alert and oriented to person, place, and time.  Skin: Skin is warm and dry.  Psychiatric: He has a normal mood and affect. His behavior is normal. Thought content normal.          Assessment & Plan:  Low back pain-uncontrolled. Will refer to neurosurgery for further evaluation and recommendations.  I have advised him to remain out of work until further recommendations from neurosurgery.  Hip pain- will refer to sports medicine for further evaluation.

## 2018-06-06 ENCOUNTER — Ambulatory Visit (HOSPITAL_BASED_OUTPATIENT_CLINIC_OR_DEPARTMENT_OTHER)
Admission: RE | Admit: 2018-06-06 | Discharge: 2018-06-06 | Disposition: A | Discharge status: Home / Self Care | Payer: BLUE CROSS/BLUE SHIELD | Source: Ambulatory Visit | Attending: Family Medicine | Admitting: Family Medicine

## 2018-06-06 ENCOUNTER — Ambulatory Visit (INDEPENDENT_AMBULATORY_CARE_PROVIDER_SITE_OTHER): Payer: BLUE CROSS/BLUE SHIELD | Admitting: Family Medicine

## 2018-06-06 ENCOUNTER — Encounter: Payer: Self-pay | Admitting: Family Medicine

## 2018-06-06 VITALS — BP 121/80 | HR 68 | Ht 67.0 in | Wt 250.0 lb

## 2018-06-06 DIAGNOSIS — M1612 Unilateral primary osteoarthritis, left hip: Secondary | ICD-10-CM | POA: Insufficient documentation

## 2018-06-06 DIAGNOSIS — M25552 Pain in left hip: Secondary | ICD-10-CM

## 2018-06-06 NOTE — Progress Notes (Signed)
PCP and consultation requested by: [name], NP  Subjective:   HPI: Patient is a 62 y.o. male here for left hip pain.  Patient reports on June 5 while at work he slipped and fell directly onto his left hip. Immediate pain, difficulty walking after this. He had radiographs in July that were negative of the hip. He's continued to have pain in left posterior and lateral hip radiating down to foot with associated numbness in foot and 'funny feeling' in his left leg. Pain also in his groin. Taking meloxicam as needed. Did extensive physical therapy which helped some but not completely. Pain worse with left hip flexion. Pain level 4-5/10. No bowel/bladder dysfunction. Has had MRI of lumbar spine showing multilevel pathology but especially at L2-3 and L5-S1 where there's evidence of neural impingement (L2 and L3) and foraminal narrowing (L5) - going to see neurosurgery.  Past Medical History:  Diagnosis Date  . Anemia 11/16/2014  . Bladder disease    "an unusual bladder disease; don't know what it's called" (06/30/2015)  . Bulging lumbar disc   . GERD (gastroesophageal reflux disease)   . History of hiatal hernia   . Hyperglycemia 11/16/2014  . Hyperlipemia   . Hypertension   . Joint pain, knee   . Migraine    06/30/2015 "maybe 2-3 times/year; not as bad as I used to have them"  . OSA (obstructive sleep apnea)     Current Outpatient Medications on File Prior to Visit  Medication Sig Dispense Refill  . albuterol (PROVENTIL HFA;VENTOLIN HFA) 108 (90 Base) MCG/ACT inhaler Inhale 2 puffs into the lungs every 6 (six) hours as needed for wheezing or shortness of breath. 1 Inhaler 2  . albuterol (PROVENTIL) (2.5 MG/3ML) 0.083% nebulizer solution Take 3 mLs (2.5 mg total) by nebulization every 6 (six) hours as needed for wheezing or shortness of breath. 150 mL 1  . ALPRAZolam (XANAX) 0.5 MG tablet Take 0.5 tablets (0.25 mg total) by mouth 2 (two) times daily as needed for anxiety. 30  tablet 0  . amLODipine-benazepril (LOTREL) 10-40 MG capsule Take 1 capsule by mouth daily. 90 capsule 1  . ARIPiprazole (ABILIFY) 15 MG tablet Take 0.5 tablets (7.5 mg total) by mouth daily. 30 tablet 0  . b complex vitamins tablet Take 1 tablet by mouth daily.    . benzonatate (TESSALON) 100 MG capsule Take 1 capsule (100 mg total) by mouth 3 (three) times daily as needed for cough. 30 capsule 0  . cyclobenzaprine (FLEXERIL) 10 MG tablet Take 1 tablet by mouth as directed.  0  . doxycycline (VIBRA-TABS) 100 MG tablet Take 1 tablet (100 mg total) by mouth 2 (two) times daily. 20 tablet 0  . ferrous sulfate 325 (65 FE) MG tablet Take 1 tablet (325 mg total) by mouth 2 (two) times daily with a meal. 60 tablet 0  . fexofenadine (ALLEGRA) 180 MG tablet Take 1 tablet (180 mg total) by mouth daily. 30 tablet 0  . furosemide (LASIX) 20 MG tablet TAKE 1 TABLET DAILY 90 tablet 1  . hydrochlorothiazide (HYDRODIURIL) 25 MG tablet TAKE 1 TABLET DAILY 90 tablet 1  . meloxicam (MOBIC) 7.5 MG tablet Take 1 tablet (7.5 mg total) by mouth daily. 30 tablet 0  . metoprolol succinate (TOPROL-XL) 50 MG 24 hr tablet Take 1 tablet (50 mg total) by mouth daily. Take with or immediately following a meal. 30 tablet 3  . Multiple Vitamin (MULTIVITAMIN WITH MINERALS) TABS tablet Take 1 tablet by mouth daily.    Marland Kitchen  Nebulizers (VIOS AEROSOL DELIVERY SYSTEM) MISC 1 each as directed.  0  . omeprazole (PRILOSEC) 40 MG capsule Take 1 capsule (40 mg total) by mouth daily. 90 capsule 0  . potassium chloride (K-DUR,KLOR-CON) 10 MEQ tablet Take 1 tablet (10 mEq total) by mouth daily. 30 tablet 0  . sertraline (ZOLOFT) 100 MG tablet Take 1.5 tablets (150 mg total) by mouth daily. 30 tablet 0  . sildenafil (VIAGRA) 100 MG tablet Take 1 tablet (100 mg total) by mouth daily as needed for erectile dysfunction. 10 tablet 2   No current facility-administered medications on file prior to visit.     Past Surgical History:  Procedure  Laterality Date  . ESOPHAGOGASTRODUODENOSCOPY    . ESOPHAGOGASTRODUODENOSCOPY (EGD) WITH ESOPHAGEAL DILATION  ~ 2014  . KNEE ARTHROSCOPY Right 1995  . NASAL SEPTUM SURGERY  ~ 1972  . TOTAL KNEE ARTHROPLASTY  10/11/2011   Procedure: TOTAL KNEE ARTHROPLASTY;  Surgeon: Rudean Haskell, MD;  Location: [facility];  Service: Orthopedics;  Laterality: Right;  . 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)  ~2014    No Known Allergies  Social History   Socioeconomic History  . Marital status: Married    Spouse name: Not on file  . Number of children: 3  . Years of education: Not on file  . Highest education level: Not on file  Occupational History  . Not on file  Social Needs  . Financial resource strain: Not on file  . Food insecurity:    Worry: Not on file    Inability: Not on file  . Transportation needs:    Medical: Not on file    Non-medical: Not on file  Tobacco Use  . Smoking status: Former Smoker    Types: Cigarettes  . Smokeless tobacco: Never Used  . Tobacco comment: "stopped smoking in ~ 1990"  Substance and Sexual Activity  . Alcohol use: No    Alcohol/week: 0.0 standard drinks  . Drug use: No  . Sexual activity: Yes  Lifestyle  . Physical activity:    Days per week: Not on file    Minutes per session: Not on file  . Stress: Not on file  Relationships  . Social connections:    Talks on phone: Not on file    Gets together: Not on file    Attends religious service: Not on file    Active member of club or organization: Not on file    Attends meetings of clubs or organizations: Not on file    Relationship status: Not on file  . Intimate partner violence:    Fear of current or ex partner: Not on file    Emotionally abused: Not on file    Physically abused: Not on file    Forced sexual activity: Not on file  Other Topics Concern  . 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 Stroke Mother 88       Died of CVA  . Hypertension Mother   . Anesthesia problems Neg Hx   . Hypotension Neg Hx   . Malignant hyperthermia Neg Hx   . Pseudochol deficiency Neg Hx     BP 121/80   Pulse 68   Ht 5\' 7"  (1.702 m)   Wt  250 lb (113.4 kg)   BMI 39.16 kg/m   Review of Systems: See HPI above.     Objective:  Physical Exam:  Gen: NAD, comfortable in exam room  Back: No gross deformity, scoliosis. TTP left lumbar paraspinal region, glutes.  No midline or bony TTP. ROM full extension with flexion to 70 degrees. Strength LEs 5/5 all muscle groups.   3+ MSRs in patellar and 2+ achilles tendons, equal bilaterally. Positive SLR on left, negative right Sensation intact to light touch bilaterally.  Left hip: No deformity. Mild limitation IR with pain.  5/5 strength all motions - mild pain with hip flexion Minimal tenderness greater trochanter - similar on right. NVI distally. Negative logroll Negative fabers and piriformis stretches.   Assessment & Plan:  1. Left hip pain - independently reviewed repeat radiographs to assess for possible AVN with femoral head collapse and no abnormalities noted - given he's having pain in groin area following a fall, concern for occult AVN advised we go ahead with an MRI to further assess.  We discussed probability most of his pain is accounted for by his lumbar spine pathology.  Meloxicam as needed.  Continue HEP from physical therapy.  Will contact him with MRI results and if any further intervention required for his hip.

## 2018-06-06 NOTE — Patient Instructions (Signed)
We will go ahead with an MRI of your hip to assess for avascular necrosis. Follow up with neurosurgery when they call you. Meloxicam as needed for pain, inflammation. Do home exercises you learned in physical therapy. I will contact you with the MRI results and next steps.

## 2018-06-11 ENCOUNTER — Other Ambulatory Visit: Payer: Self-pay | Admitting: Family

## 2018-06-15 ENCOUNTER — Other Ambulatory Visit: Payer: Self-pay | Admitting: Family

## 2018-06-16 ENCOUNTER — Other Ambulatory Visit: Payer: Self-pay | Admitting: *Deleted

## 2018-06-16 MED ORDER — METOPROLOL SUCCINATE ER 50 MG PO TB24: 50.0000 mg | ORAL_TABLET | Freq: Every day | ORAL | 1 refills | Status: DC

## 2018-06-16 NOTE — Telephone Encounter (Signed)
Received refill request from [facility] for metoprolol ER 50mg . Refill sent.

## 2018-06-22 DIAGNOSIS — N5201 Erectile dysfunction due to arterial insufficiency: Secondary | ICD-10-CM | POA: Diagnosis not present

## 2018-06-23 DIAGNOSIS — M4310 Spondylolisthesis, site unspecified: Secondary | ICD-10-CM | POA: Diagnosis not present

## 2018-06-23 DIAGNOSIS — I10 Essential (primary) hypertension: Secondary | ICD-10-CM | POA: Diagnosis not present

## 2018-06-23 DIAGNOSIS — Z6839 Body mass index (BMI) 39.0-39.9, adult: Secondary | ICD-10-CM | POA: Diagnosis not present

## 2018-06-23 DIAGNOSIS — M5416 Radiculopathy, lumbar region: Secondary | ICD-10-CM | POA: Diagnosis not present

## 2018-06-25 DIAGNOSIS — G4733 Obstructive sleep apnea (adult) (pediatric): Secondary | ICD-10-CM | POA: Diagnosis not present

## 2018-06-26 ENCOUNTER — Ambulatory Visit
Admission: RE | Admit: 2018-06-26 | Discharge: 2018-06-26 | Disposition: A | Discharge status: Home / Self Care | Payer: BLUE CROSS/BLUE SHIELD | Source: Ambulatory Visit | Attending: Family Medicine | Admitting: Family Medicine

## 2018-06-26 DIAGNOSIS — S4382XA Sprain of other specified parts of left shoulder girdle, initial encounter: Secondary | ICD-10-CM | POA: Diagnosis not present

## 2018-06-26 DIAGNOSIS — M25552 Pain in left hip: Secondary | ICD-10-CM

## 2018-07-06 ENCOUNTER — Other Ambulatory Visit: Payer: Self-pay | Admitting: Neurological Surgery

## 2018-07-06 DIAGNOSIS — M4310 Spondylolisthesis, site unspecified: Secondary | ICD-10-CM

## 2018-07-06 DIAGNOSIS — N5201 Erectile dysfunction due to arterial insufficiency: Secondary | ICD-10-CM | POA: Diagnosis not present

## 2018-07-17 ENCOUNTER — Ambulatory Visit
Admission: RE | Admit: 2018-07-17 | Discharge: 2018-07-17 | Disposition: A | Discharge status: Home / Self Care | Payer: BLUE CROSS/BLUE SHIELD | Source: Ambulatory Visit | Attending: Neurological Surgery | Admitting: Neurological Surgery

## 2018-07-17 DIAGNOSIS — M4310 Spondylolisthesis, site unspecified: Secondary | ICD-10-CM

## 2018-07-17 DIAGNOSIS — M545 Low back pain: Secondary | ICD-10-CM | POA: Diagnosis not present

## 2018-07-17 MED ORDER — IOPAMIDOL (ISOVUE-M 200) INJECTION 41%
1.0000 mL | Freq: Once | INTRAMUSCULAR | Status: AC
  Administered 2018-07-17: 1 mL via EPIDURAL

## 2018-07-17 MED ORDER — METHYLPREDNISOLONE ACETATE 40 MG/ML INJ SUSP (RADIOLOG
120.0000 mg | Freq: Once | INTRAMUSCULAR | Status: AC
  Administered 2018-07-17: 120 mg via EPIDURAL

## 2018-07-17 NOTE — Discharge Instructions (Signed)

## 2018-07-19 ENCOUNTER — Ambulatory Visit: Payer: BLUE CROSS/BLUE SHIELD | Admitting: Pulmonary Disease

## 2018-07-19 ENCOUNTER — Ambulatory Visit (INDEPENDENT_AMBULATORY_CARE_PROVIDER_SITE_OTHER): Payer: BLUE CROSS/BLUE SHIELD | Admitting: Family

## 2018-07-19 ENCOUNTER — Encounter: Payer: Self-pay | Admitting: Family

## 2018-07-19 VITALS — BP 126/70 | HR 60 | Temp 98.1°F | Ht 67.0 in | Wt 252.4 lb

## 2018-07-19 DIAGNOSIS — M25552 Pain in left hip: Secondary | ICD-10-CM

## 2018-07-19 DIAGNOSIS — G8929 Other chronic pain: Secondary | ICD-10-CM

## 2018-07-19 DIAGNOSIS — M545 Low back pain, unspecified: Secondary | ICD-10-CM

## 2018-07-19 DIAGNOSIS — Z23 Encounter for immunization: Secondary | ICD-10-CM

## 2018-07-19 DIAGNOSIS — S73192D Other sprain of left hip, subsequent encounter: Secondary | ICD-10-CM | POA: Diagnosis not present

## 2018-07-19 NOTE — Patient Instructions (Signed)
Please schedule a follow up with neurosurgery in a few weeks.

## 2018-07-19 NOTE — Addendum Note (Signed)
Addended by: Harl Bowie on: 07/19/2018 04:28 PM   Modules accepted: Orders

## 2018-07-19 NOTE — Progress Notes (Signed)
Subjective:    [name], male    DOB: 11-08-1955, 62 y.o.   MRN: [id number]  HPI  Patient is a 62 yr old male who presents today for follow up. Last visit we referred him to neurosurgery for his back pain and sports medicine for his hip pain. Sports medicine referred him for an MRI of his hip. This noted mild left greater trochanteric bursitis and left anterior superior labral tear.  No AVN noted.   He underwent ESI for back pain on 07/17/18. This was done at [facility].  Pain is with movement, (bending, twisting, standing).     Review of Systems    see HPI  Past Medical History:  Diagnosis Date  . Anemia 11/16/2014  . Bladder disease    "an unusual bladder disease; don't know what it's called" (06/30/2015)  . Bulging lumbar disc   . GERD (gastroesophageal reflux disease)   . History of hiatal hernia   . Hyperglycemia 11/16/2014  . Hyperlipemia   . Hypertension   . Joint pain, knee   . Migraine    06/30/2015 "maybe 2-3 times/year; not as bad as I used to have them"  . OSA (obstructive sleep apnea)      Social History   Socioeconomic History  . Marital status: Married    Spouse name: Not on file  . Number of children: 3  . Years of education: Not on file  . Highest education level: Not on file  Occupational History  . Not on file  Social Needs  . Financial resource strain: Not on file  . Food insecurity:    Worry: Not on file    Inability: Not on file  . Transportation needs:    Medical: Not on file    Non-medical: Not on file  Tobacco Use  . Smoking status: Former Smoker    Types: Cigarettes  . Smokeless tobacco: Never Used  . Tobacco comment: "stopped smoking in ~ 1990"  Substance and Sexual Activity  . Alcohol use: No    Alcohol/week: 0.0 standard drinks  . Drug use: No  . Sexual activity: Yes  Lifestyle  . Physical activity:    Days per week: Not on file    Minutes per session: Not on file  . Stress: Not on file  Relationships  .  Social connections:    Talks on phone: Not on file    Gets together: Not on file    Attends religious service: Not on file    Active member of club or organization: Not on file    Attends meetings of clubs or organizations: Not on file    Relationship status: Not on file  . Intimate partner violence:    Fear of current or ex partner: Not on file    Emotionally abused: Not on file    Physically abused: Not on file    Forced sexual activity: Not on file  Other Topics Concern  . 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 ESOPHAGOGASTRODUODENOSCOPY    . ESOPHAGOGASTRODUODENOSCOPY (EGD) WITH ESOPHAGEAL DILATION  ~ 2014  . KNEE ARTHROSCOPY Right 1995  . NASAL SEPTUM SURGERY  ~ 1972  . TOTAL KNEE ARTHROPLASTY  10/11/2011   Procedure: TOTAL KNEE ARTHROPLASTY;  Surgeon: Annie Main  Glenda Chroman, MD;  Location: [place];  Service: Orthopedics;  Laterality: Right;  . 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 Stroke Mother 57       Died of CVA  . Hypertension Mother   . Anesthesia problems Neg Hx   . Hypotension Neg Hx   . Malignant hyperthermia Neg Hx   . Pseudochol deficiency Neg Hx     No Known Allergies  Current Outpatient Medications on File Prior to Visit  Medication Sig Dispense Refill  . albuterol (PROVENTIL HFA;VENTOLIN HFA) 108 (90 Base) MCG/ACT inhaler Inhale 2 puffs into the lungs every 6 (six) hours as needed for wheezing or shortness of breath. 1 Inhaler 2  . albuterol (PROVENTIL) (2.5 MG/3ML) 0.083% nebulizer solution Take 3 mLs (2.5 mg total) by nebulization every 6 (six) hours as needed for wheezing or shortness of breath. 150 mL 1  . ALPRAZolam (XANAX) 0.5 MG tablet Take 0.5 tablets (0.25 mg total) by mouth 2 (two) times  daily as needed for anxiety. 30 tablet 0  . amLODipine-benazepril (LOTREL) 10-40 MG capsule Take 1 capsule by mouth daily. 90 capsule 1  . ARIPiprazole (ABILIFY) 15 MG tablet Take 0.5 tablets (7.5 mg total) by mouth daily. 30 tablet 0  . b complex vitamins tablet Take 1 tablet by mouth daily.    . cyclobenzaprine (FLEXERIL) 10 MG tablet Take 1 tablet by mouth as directed.  0  . ferrous sulfate 325 (65 FE) MG tablet Take 1 tablet (325 mg total) by mouth 2 (two) times daily with a meal. 60 tablet 0  . fexofenadine (ALLEGRA) 180 MG tablet Take 1 tablet (180 mg total) by mouth daily. 30 tablet 0  . furosemide (LASIX) 20 MG tablet TAKE 1 TABLET DAILY 90 tablet 1  . hydrochlorothiazide (HYDRODIURIL) 25 MG tablet TAKE 1 TABLET DAILY 90 tablet 4  . meloxicam (MOBIC) 7.5 MG tablet Take 1 tablet (7.5 mg total) by mouth daily. 30 tablet 0  . metoprolol succinate (TOPROL-XL) 50 MG 24 hr tablet Take 1 tablet (50 mg total) by mouth daily. Take with or immediately following a meal. 90 tablet 1  . Multiple Vitamin (MULTIVITAMIN WITH MINERALS) TABS tablet Take 1 tablet by mouth daily.    . Nebulizers (VIOS AEROSOL DELIVERY SYSTEM) MISC 1 each as directed.  0  . omeprazole (PRILOSEC) 40 MG capsule Take 1 capsule (40 mg total) by mouth daily. 90 capsule 0  . potassium chloride (K-DUR,KLOR-CON) 10 MEQ tablet Take 1 tablet (10 mEq total) by mouth daily. 30 tablet 0  . sertraline (ZOLOFT) 100 MG tablet Take 1.5 tablets (150 mg total) by mouth daily. 30 tablet 0   No current facility-administered medications on file prior to visit.     BP 126/70 (BP Location: Right Arm, Patient Position: Sitting, Cuff Size: Large)   Pulse 60   Temp 98.1 F (36.7 C) (Oral)   Ht 5\' 7"  (1.702 m)   Wt 252 lb 6.4 oz (114.5 kg)   SpO2 96%   BMI 39.53 kg/m    Objective:   Physical Exam  Constitutional: He is oriented to person, place, and time. He appears well-developed and well-nourished. No distress.  HENT:  Head:  Normocephalic and atraumatic.  Cardiovascular: Normal rate and regular rhythm.  No murmur heard. Pulmonary/Chest: Effort normal and breath sounds normal. No respiratory distress. He has no wheezes. He has no rales.  Musculoskeletal: He exhibits no  edema.       Cervical back: He exhibits no tenderness.       Thoracic back: He exhibits no tenderness.       Lumbar back: He exhibits no tenderness.  Neurological: He is alert and oriented to person, place, and time.  Skin: Skin is warm and dry.  Psychiatric: He has a normal mood and affect. His behavior is normal. Thought content normal.          Assessment & Plan:  Low back pain- uncontrolled.  I have advised him to follow up with his neurosurgeon in 2 weeks so that they can discuss other options if pain is not improved. I have written him out of work until 12/27 but advised him that I would like his neurosurgeon to manage his disability paperwork/return to work moving forward since they are now managing his back pain.   Left hip pain/labral tear- clinically improved. Will reach out to his sports med MD to see if any additional follow up is needed.  Shingrix #1 today.

## 2018-07-24 ENCOUNTER — Ambulatory Visit (INDEPENDENT_AMBULATORY_CARE_PROVIDER_SITE_OTHER): Payer: BLUE CROSS/BLUE SHIELD | Admitting: Family Medicine

## 2018-07-24 ENCOUNTER — Encounter: Payer: Self-pay | Admitting: Family Medicine

## 2018-07-24 VITALS — BP 107/75 | HR 60 | Ht 67.0 in | Wt 250.0 lb

## 2018-07-24 DIAGNOSIS — M25552 Pain in left hip: Secondary | ICD-10-CM

## 2018-07-24 NOTE — Progress Notes (Signed)
PCP and consultation requested by: [name], NP  Subjective:   HPI: Patient is a 62 y.o. male here for left hip pain.  06/06/18: Patient reports on June 5 while at work he slipped and fell directly onto his left hip. Immediate pain, difficulty walking after this. He had radiographs in July that were negative of the hip. He's continued to have pain in left posterior and lateral hip radiating down to foot with associated numbness in foot and 'funny feeling' in his left leg. Pain also in his groin. Taking meloxicam as needed. Did extensive physical therapy which helped some but not completely. Pain worse with left hip flexion. Pain level 4-5/10. No bowel/bladder dysfunction. Has had MRI of lumbar spine showing multilevel pathology but especially at L2-3 and L5-S1 where there's evidence of neural impingement (L2 and L3) and foraminal narrowing (L5) - going to see neurosurgery.  07/24/18: Patient returns today for left hip pain.  He reports 0 out of 10 hip pain currently.  He also reports 2/10 left low back pain.  He states he has seen neurosurgery and had an epidural steroid injection performed on 07/17/2018.  This gave him relief from a 5/10 to 2/10.  He continues to have some back pain with bending forward or twisting.  He is not taking any other pain medications.  He denies any radiation of his pain down into his leg.  No numbness or tingling.  No bowel/bladder symptoms.  No groin pain.  Past Medical History:  Diagnosis Date  . Anemia 11/16/2014  . Bladder disease    "an unusual bladder disease; don't know what it's called" (06/30/2015)  . Bulging lumbar disc   . GERD (gastroesophageal reflux disease)   . History of hiatal hernia   . Hyperglycemia 11/16/2014  . Hyperlipemia   . Hypertension   . Joint pain, knee   . Migraine    06/30/2015 "maybe 2-3 times/year; not as bad as I used to have them"  . OSA (obstructive sleep apnea)     Current Outpatient Medications on File  Prior to Visit  Medication Sig Dispense Refill  . albuterol (PROVENTIL HFA;VENTOLIN HFA) 108 (90 Base) MCG/ACT inhaler Inhale 2 puffs into the lungs every 6 (six) hours as needed for wheezing or shortness of breath. 1 Inhaler 2  . albuterol (PROVENTIL) (2.5 MG/3ML) 0.083% nebulizer solution Take 3 mLs (2.5 mg total) by nebulization every 6 (six) hours as needed for wheezing or shortness of breath. 150 mL 1  . ALPRAZolam (XANAX) 0.5 MG tablet Take 0.5 tablets (0.25 mg total) by mouth 2 (two) times daily as needed for anxiety. 30 tablet 0  . amLODipine-benazepril (LOTREL) 10-40 MG capsule Take 1 capsule by mouth daily. 90 capsule 1  . ARIPiprazole (ABILIFY) 15 MG tablet Take 0.5 tablets (7.5 mg total) by mouth daily. 30 tablet 0  . b complex vitamins tablet Take 1 tablet by mouth daily.    . cyclobenzaprine (FLEXERIL) 10 MG tablet Take 1 tablet by mouth as directed.  0  . ferrous sulfate 325 (65 FE) MG tablet Take 1 tablet (325 mg total) by mouth 2 (two) times daily with a meal. 60 tablet 0  . fexofenadine (ALLEGRA) 180 MG tablet Take 1 tablet (180 mg total) by mouth daily. 30 tablet 0  . furosemide (LASIX) 20 MG tablet TAKE 1 TABLET DAILY 90 tablet 1  . hydrochlorothiazide (HYDRODIURIL) 25 MG tablet TAKE 1 TABLET DAILY 90 tablet 4  . meloxicam (MOBIC) 7.5 MG tablet Take 1 tablet (  7.5 mg total) by mouth daily. 30 tablet 0  . metoprolol succinate (TOPROL-XL) 50 MG 24 hr tablet Take 1 tablet (50 mg total) by mouth daily. Take with or immediately following a meal. 90 tablet 1  . Multiple Vitamin (MULTIVITAMIN WITH MINERALS) TABS tablet Take 1 tablet by mouth daily.    . Nebulizers (VIOS AEROSOL DELIVERY SYSTEM) MISC 1 each as directed.  0  . omeprazole (PRILOSEC) 40 MG capsule Take 1 capsule (40 mg total) by mouth daily. 90 capsule 0  . potassium chloride (K-DUR,KLOR-CON) 10 MEQ tablet Take 1 tablet (10 mEq total) by mouth daily. 30 tablet 0  . sertraline (ZOLOFT) 100 MG tablet Take 1.5 tablets (150 mg  total) by mouth daily. 30 tablet 0   No current facility-administered medications on file prior to visit.     Past Surgical History:  Procedure Laterality Date  . ESOPHAGOGASTRODUODENOSCOPY    . ESOPHAGOGASTRODUODENOSCOPY (EGD) WITH ESOPHAGEAL DILATION  ~ 2014  . KNEE ARTHROSCOPY Right 1995  . NASAL SEPTUM SURGERY  ~ 1972  . TOTAL KNEE ARTHROPLASTY  10/11/2011   Procedure: TOTAL KNEE ARTHROPLASTY;  Surgeon: Rudean Haskell, MD;  Location: [facility];  Service: Orthopedics;  Laterality: Right;  . 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)  ~2014    No Known Allergies  Social History   Socioeconomic History  . Marital status: Married    Spouse name: Not on file  . Number of children: 3  . Years of education: Not on file  . Highest education level: Not on file  Occupational History  . Not on file  Social Needs  . Financial resource strain: Not on file  . Food insecurity:    Worry: Not on file    Inability: Not on file  . Transportation needs:    Medical: Not on file    Non-medical: Not on file  Tobacco Use  . Smoking status: Former Smoker    Types: Cigarettes  . Smokeless tobacco: Never Used  . Tobacco comment: "stopped smoking in ~ 1990"  Substance and Sexual Activity  . Alcohol use: No    Alcohol/week: 0.0 standard drinks  . Drug use: No  . Sexual activity: Yes  Lifestyle  . Physical activity:    Days per week: Not on file    Minutes per session: Not on file  . Stress: Not on file  Relationships  . Social connections:    Talks on phone: Not on file    Gets together: Not on file    Attends religious service: Not on file    Active member of club or organization: Not on file    Attends meetings of clubs or organizations: Not on file    Relationship status: Not on file  . Intimate partner violence:    Fear of current or ex partner: Not on file    Emotionally abused: Not on file    Physically abused: Not on file    Forced sexual activity: Not  on file  Other Topics Concern  . 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 Stroke Mother 24       Died of CVA  . Hypertension Mother   . Anesthesia problems Neg Hx   . Hypotension Neg Hx   .  Malignant hyperthermia Neg Hx   . Pseudochol deficiency Neg Hx     BP 107/75   Pulse 60   Ht 5\' 7"  (1.702 m)   Wt 250 lb (113.4 kg)   BMI 39.16 kg/m   Review of Systems: See HPI above.     Objective:  Physical Exam:  GEN: Awake, alert, no acute distress  Left hip: No obvious deformity No tenderness of the greater trochanter Mod limitation IR but no pain.  Full ER.  Symmetric internal/external rotation bilaterally 5/5 strength in hip abduction and flexion Negative FABER and FADIR.  Negative logroll  Lumbar spine: Patient has diffuse tenderness of the lower lumbar spine both midline as well as the left paraspinals.  There is no focal bony tenderness. Sensation intact in BLE. 5/5 strength in knee flexion/extension and foot dorsiflexion/plantarflexion   Assessment & Plan:  1.  Left hip pain - no evidence of symptomatic hip pathology.  Hip pain is now resolved - no pain attributed to his labral tear.  Continue with treatment of his lumbar spine and follow up with neurosurgery.  Call us if he develops anterior hip pain.  Otherwise f/u prn.

## 2018-07-24 NOTE — Patient Instructions (Signed)
Your hip is doing great! Keep follow-ups with the neurosurgeon for your back. If you develop pain in the groin, worse with hip motions as we discussed give me a call. Otherwise follow up with me as needed.

## 2018-07-25 ENCOUNTER — Telehealth: Payer: Self-pay | Admitting: Family

## 2018-07-25 DIAGNOSIS — G4733 Obstructive sleep apnea (adult) (pediatric): Secondary | ICD-10-CM | POA: Diagnosis not present

## 2018-07-25 NOTE — Telephone Encounter (Signed)
Reviewed PT discharge summary.  Patient has a follow up appointment with neurosurgeon already.

## 2018-08-03 DIAGNOSIS — M5416 Radiculopathy, lumbar region: Secondary | ICD-10-CM | POA: Diagnosis not present

## 2018-08-04 ENCOUNTER — Ambulatory Visit: Payer: BLUE CROSS/BLUE SHIELD | Admitting: Pulmonary Disease

## 2018-08-07 ENCOUNTER — Encounter: Payer: Self-pay | Admitting: Pulmonary Disease

## 2018-08-07 ENCOUNTER — Ambulatory Visit (INDEPENDENT_AMBULATORY_CARE_PROVIDER_SITE_OTHER): Payer: BLUE CROSS/BLUE SHIELD | Admitting: Pulmonary Disease

## 2018-08-07 VITALS — BP 118/80 | HR 64 | Ht 67.0 in | Wt 249.0 lb

## 2018-08-07 DIAGNOSIS — G4733 Obstructive sleep apnea (adult) (pediatric): Secondary | ICD-10-CM

## 2018-08-07 NOTE — Assessment & Plan Note (Signed)
Weight loss encouraged. Relationship between OSA and weight discussed

## 2018-08-07 NOTE — Assessment & Plan Note (Signed)
Although he is symptomatically much improved, he has significant residual events about 20/hour on today's download.  About half of these were obstructive events but have or central events suggesting treatment emergent centrals or complex sleep apnea.  I am hesitant to increase the CPAP further he only has a mild leak. We will start with tightening his settings, since he needs an average pressure of 14 and a maximum pressure of 16 we will change him to auto settings 10 to 18 cm and recheck a download in a month and see if central events subside with time.  If they do not, he may eventually need bilevel but would like to give him a good 6 to 12 months to see if these events subside  Weight loss encouraged, compliance with goal of at least 4-6 hrs every night is the expectation. Advised against medications with sedative side effects Cautioned against driving when sleepy - understanding that sleepiness will vary on a day to day basis

## 2018-08-07 NOTE — Progress Notes (Signed)
   Subjective:    [name], male    DOB: [birth year], 62 y.o.   MRN: [id number]  HPI  62 year old for follow-up of severe OSA. He was started on auto CPAP settings and seems to have adjusted well up with a full facemask.  CPAP download in 03/2018 showed residual AHI of 11/hour. CPAP download today shows residual AHI of 20/hour with centrals about 9/hour and obstructives about 7/hour with mild leak and average pressure of 14 cm.  Compliance is excellent without any missed night, more than 7 hours per night  He feels rested and more alert and awake in the daytime.  He certainly has more energy and CPAP is helped improve his daytime somnolence and fatigue We discussed care of the machine  Weight is unchanged  Significant tests/ events reviewed  NPSG 11/2017  severe OSA with AHI 45/hour with lowest desaturation of 71% >> titrated to bilevel 23/19   Review of Systems neg for any significant sore throat, dysphagia, itching, sneezing, nasal congestion or excess/ purulent secretions, fever, chills, sweats, unintended wt loss, pleuritic or exertional cp, hempoptysis, orthopnea pnd or change in chronic leg swelling. Also denies presyncope, palpitations, heartburn, abdominal pain, nausea, vomiting, diarrhea or change in bowel or urinary habits, dysuria,hematuria, rash, arthralgias, visual complaints, headache, numbness weakness or ataxia.     Objective:   Physical Exam  Gen. Pleasant, obese, in no distress ENT - no lesions, no post nasal drip Neck: No JVD, no thyromegaly, no carotid bruits Lungs: no use of accessory muscles, no dullness to percussion, decreased without rales or rhonchi  Cardiovascular: Rhythm regular, heart sounds  normal, no murmurs or gallops, no peripheral edema Musculoskeletal: No deformities, no cyanosis or clubbing , no tremors       Assessment & Plan:

## 2018-08-07 NOTE — Patient Instructions (Signed)
Change auto settings to 10 to 18 cm and recheck download in 1 month

## 2018-08-11 ENCOUNTER — Ambulatory Visit: Payer: BLUE CROSS/BLUE SHIELD | Admitting: Family

## 2018-08-17 ENCOUNTER — Encounter: Payer: Self-pay | Admitting: Emergency Medicine

## 2018-08-17 DIAGNOSIS — F419 Anxiety disorder, unspecified: Secondary | ICD-10-CM

## 2018-08-21 ENCOUNTER — Encounter: Payer: Self-pay | Admitting: Family

## 2018-08-21 ENCOUNTER — Ambulatory Visit (INDEPENDENT_AMBULATORY_CARE_PROVIDER_SITE_OTHER): Payer: BLUE CROSS/BLUE SHIELD | Admitting: Family

## 2018-08-21 VITALS — BP 135/74 | HR 66 | Temp 98.2°F | Resp 16 | Ht 67.0 in | Wt 252.0 lb

## 2018-08-21 DIAGNOSIS — M545 Low back pain, unspecified: Secondary | ICD-10-CM

## 2018-08-21 DIAGNOSIS — H35033 Hypertensive retinopathy, bilateral: Secondary | ICD-10-CM | POA: Diagnosis not present

## 2018-08-21 DIAGNOSIS — G4733 Obstructive sleep apnea (adult) (pediatric): Secondary | ICD-10-CM | POA: Diagnosis not present

## 2018-08-21 DIAGNOSIS — H3561 Retinal hemorrhage, right eye: Secondary | ICD-10-CM | POA: Diagnosis not present

## 2018-08-21 DIAGNOSIS — H53022 Refractive amblyopia, left eye: Secondary | ICD-10-CM | POA: Diagnosis not present

## 2018-08-21 NOTE — Patient Instructions (Signed)
Please follow back up with Dr. Zada Finders for further evaluation of your ongoing back pain and for disability paperwork.

## 2018-08-21 NOTE — Progress Notes (Signed)
Subjective:    [name], male    DOB: 1955-09-30, 62 y.o.   MRN: [id number]  HPI  Patient is a 62 yr old male who presents today for follow up of his low back pain. Reports ongoing intermittent low back pain which limits his ADL's. He does not feel that he can return to work yet given his back pain.   OSA- saw Dr. Elsworth Soho a few weeks ago.  Reports good compliance wit cpap. More rested during the day.   Review of Systems See HPI  Past Medical History:  Diagnosis Date  . Anemia 11/16/2014  . Bladder disease    "an unusual bladder disease; don't know what it's called" (06/30/2015)  . Bulging lumbar disc   . GERD (gastroesophageal reflux disease)   . History of hiatal hernia   . Hyperglycemia 11/16/2014  . Hyperlipemia   . Hypertension   . Joint pain, knee   . Migraine    06/30/2015 "maybe 2-3 times/year; not as bad as I used to have them"  . OSA (obstructive sleep apnea)      Social History   Socioeconomic History  . Marital status: Married    Spouse name: Not on file  . Number of children: 3  . Years of education: Not on file  . Highest education level: Not on file  Occupational History  . Not on file  Social Needs  . Financial resource strain: Not on file  . Food insecurity:    Worry: Not on file    Inability: Not on file  . Transportation needs:    Medical: Not on file    Non-medical: Not on file  Tobacco Use  . Smoking status: Former Smoker    Types: Cigarettes  . Smokeless tobacco: Never Used  . Tobacco comment: "stopped smoking in ~ 1990"  Substance and Sexual Activity  . Alcohol use: No    Alcohol/week: 0.0 standard drinks  . Drug use: No  . Sexual activity: Yes  Lifestyle  . Physical activity:    Days per week: Not on file    Minutes per session: Not on file  . Stress: Not on file  Relationships  . Social connections:    Talks on phone: Not on file    Gets together: Not on file    Attends religious service: Not on file    Active  member of club or organization: Not on file    Attends meetings of clubs or organizations: Not on file    Relationship status: Not on file  . Intimate partner violence:    Fear of current or ex partner: Not on file    Emotionally abused: Not on file    Physically abused: Not on file    Forced sexual activity: Not on file  Other Topics Concern  . 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 ESOPHAGOGASTRODUODENOSCOPY    . ESOPHAGOGASTRODUODENOSCOPY (EGD) WITH ESOPHAGEAL DILATION  ~ 2014  . KNEE ARTHROSCOPY Right 1995  . NASAL SEPTUM SURGERY  ~ 1972  . TOTAL KNEE ARTHROPLASTY  10/11/2011   Procedure: TOTAL KNEE ARTHROPLASTY;  Surgeon: Rudean Haskell, MD;  Location: [facility];  Service: Orthopedics;  Laterality: Right;  . 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)  ~  2014    Family History  Problem Relation Age of Onset  . Stroke Mother 48       Died of CVA  . Hypertension Mother   . Anesthesia problems Neg Hx   . Hypotension Neg Hx   . Malignant hyperthermia Neg Hx   . Pseudochol deficiency Neg Hx     No Known Allergies  Current Outpatient Medications on File Prior to Visit  Medication Sig Dispense Refill  . albuterol (PROVENTIL HFA;VENTOLIN HFA) 108 (90 Base) MCG/ACT inhaler Inhale 2 puffs into the lungs every 6 (six) hours as needed for wheezing or shortness of breath. 1 Inhaler 2  . albuterol (PROVENTIL) (2.5 MG/3ML) 0.083% nebulizer solution Take 3 mLs (2.5 mg total) by nebulization every 6 (six) hours as needed for wheezing or shortness of breath. 150 mL 1  . ALPRAZolam (XANAX) 0.5 MG tablet Take 0.5 tablets (0.25 mg total) by mouth 2 (two) times daily as needed for anxiety. 30 tablet 0  . amLODipine-benazepril (LOTREL) 10-40 MG capsule Take 1 capsule by mouth daily. 90 capsule 1  .  ARIPiprazole (ABILIFY) 15 MG tablet Take 0.5 tablets (7.5 mg total) by mouth daily. 30 tablet 0  . b complex vitamins tablet Take 1 tablet by mouth daily.    . cyclobenzaprine (FLEXERIL) 10 MG tablet Take 1 tablet by mouth as directed.  0  . ferrous sulfate 325 (65 FE) MG tablet Take 1 tablet (325 mg total) by mouth 2 (two) times daily with a meal. 60 tablet 0  . fexofenadine (ALLEGRA) 180 MG tablet Take 1 tablet (180 mg total) by mouth daily. 30 tablet 0  . furosemide (LASIX) 20 MG tablet TAKE 1 TABLET DAILY 90 tablet 1  . hydrochlorothiazide (HYDRODIURIL) 25 MG tablet TAKE 1 TABLET DAILY 90 tablet 4  . meloxicam (MOBIC) 7.5 MG tablet Take 1 tablet (7.5 mg total) by mouth daily. 30 tablet 0  . metoprolol succinate (TOPROL-XL) 50 MG 24 hr tablet Take 1 tablet (50 mg total) by mouth daily. Take with or immediately following a meal. 90 tablet 1  . Multiple Vitamin (MULTIVITAMIN WITH MINERALS) TABS tablet Take 1 tablet by mouth daily.    . Nebulizers (VIOS AEROSOL DELIVERY SYSTEM) MISC 1 each as directed.  0  . omeprazole (PRILOSEC) 40 MG capsule Take 1 capsule (40 mg total) by mouth daily. 90 capsule 0  . potassium chloride (K-DUR,KLOR-CON) 10 MEQ tablet Take 1 tablet (10 mEq total) by mouth daily. 30 tablet 0  . potassium gluconate 595 (99 K) MG TABS tablet Take by mouth.    . sertraline (ZOLOFT) 100 MG tablet Take 1.5 tablets (150 mg total) by mouth daily. 30 tablet 0   No current facility-administered medications on file prior to visit.     BP 135/74 (BP Location: Right Arm, Patient Position: Sitting, Cuff Size: Large)   Pulse 66   Temp 98.2 F (36.8 C) (Oral)   Resp 16   Ht 5\' 7"  (1.702 m)   Wt 252 lb (114.3 kg)   SpO2 95%   BMI 39.47 kg/m       Objective:   Physical Exam Constitutional:      General: He is not in acute distress.    Appearance: He is well-developed.  HENT:     Head: Normocephalic and atraumatic.  Cardiovascular:     Rate and Rhythm: Normal rate and regular  rhythm.     Heart sounds: No murmur.  Pulmonary:     Effort: Pulmonary effort is  normal. No respiratory distress.     Breath sounds: Normal breath sounds. No wheezing or rales.  Skin:    General: Skin is warm and dry.  Neurological:     Mental Status: He is alert and oriented to person, place, and time.  Psychiatric:        Behavior: Behavior normal.        Thought Content: Thought content normal.           Assessment & Plan:  Low back pain- he is requesting that I complete his ongoing disability papers. He is working with a Forensic psychologist and I have advised him that at this point I think it would be most appropriate for him to follow up with his spine specialist for return to work recommendations and/or ongoing disability certification.  Pt verbalizes understanding.   OSA- stable on cpap, management per sleep specialist.

## 2018-08-22 ENCOUNTER — Ambulatory Visit: Payer: BLUE CROSS/BLUE SHIELD | Admitting: Family

## 2018-08-22 ENCOUNTER — Telehealth: Payer: Self-pay | Admitting: Family

## 2018-08-22 NOTE — Telephone Encounter (Signed)
Routed to [employer] new CMA as tricia is in the lab now.

## 2018-08-22 NOTE — Telephone Encounter (Signed)
Copied from [place] [phone]. Topic: Quick Communication - See Telephone Encounter >> Aug 22, 2018  9:50 AM Rutherford Nail, NT wrote: CRM for notification. See Telephone encounter for: 08/22/18. Patient would like a call from [place] regarding a work note. Please advise.

## 2018-08-24 NOTE — Telephone Encounter (Signed)
Letter faxed to his work at [phone]. Patient was requesting a go back to work date for one month from now, letter from [place] states he is unstable to go back to work but the go back to work date will have to be determined by his back specialist. Patient advised, he has appointment with them on 09-01-2018.

## 2018-08-25 DIAGNOSIS — G4733 Obstructive sleep apnea (adult) (pediatric): Secondary | ICD-10-CM | POA: Diagnosis not present

## 2018-08-29 ENCOUNTER — Encounter: Payer: Self-pay | Admitting: Psychiatry

## 2018-08-29 ENCOUNTER — Ambulatory Visit: Payer: BLUE CROSS/BLUE SHIELD | Admitting: Psychiatry

## 2018-08-29 ENCOUNTER — Telehealth: Payer: Self-pay

## 2018-08-29 DIAGNOSIS — F4001 Agoraphobia with panic disorder: Secondary | ICD-10-CM | POA: Diagnosis not present

## 2018-08-29 DIAGNOSIS — F3342 Major depressive disorder, recurrent, in full remission: Secondary | ICD-10-CM

## 2018-08-29 DIAGNOSIS — F411 Generalized anxiety disorder: Secondary | ICD-10-CM

## 2018-08-29 MED ORDER — SERTRALINE HCL 100 MG PO TABS: 100.0000 mg | ORAL_TABLET | Freq: Every day | ORAL | 1 refills | Status: DC

## 2018-08-29 MED ORDER — ARIPIPRAZOLE 15 MG PO TABS: 7.5000 mg | ORAL_TABLET | Freq: Every day | ORAL | 1 refills | Status: DC

## 2018-08-29 NOTE — Progress Notes (Signed)
Jeremy [name] [id number] 05/14/1956 63 y.o.  Subjective:   Patient ID:  Jeremy [name] is a 63 y.o. (DOB 1956/03/12) male.  Chief Complaint:  Chief Complaint  Patient presents with  . Follow-up    Medication Management    HPI Last seen Sept Jeremy [name] presents to the office today for follow-up of anxiety and depression.  Still good.  Benefit from meds. Help depression, anxiety and anger.  Patient reports stable mood and denies depressed or irritable moods.  Patient denies any recent difficulty with anxiety.  Patient denies difficulty with sleep initiation or maintenance. Denies appetite disturbance.  Patient reports that energy and motivation have been good.  Patient denies any difficulty with concentration.  Patient denies any suicidal ideation.  Went back to work the other day but taken back out DT need for physical to clear him.  Out of work since August.  Past history of psychiatric disability.  Review of Systems:  Review of Systems  Musculoskeletal: Positive for arthralgias and back pain.  Psychiatric/Behavioral: Negative for agitation, behavioral problems, confusion, decreased concentration, dysphoric mood, hallucinations, self-injury, sleep disturbance and suicidal ideas. The patient is not nervous/anxious and is not hyperactive.     Medications: I have reviewed the patient's current medications.  Current Outpatient Medications  Medication Sig Dispense Refill  . albuterol (PROVENTIL HFA;VENTOLIN HFA) 108 (90 Base) MCG/ACT inhaler Inhale 2 puffs into the lungs every 6 (six) hours as needed for wheezing or shortness of breath. 1 Inhaler 2  . albuterol (PROVENTIL) (2.5 MG/3ML) 0.083% nebulizer solution Take 3 mLs (2.5 mg total) by nebulization every 6 (six) hours as needed for wheezing or shortness of breath. 150 mL 1  . amLODipine-benazepril (LOTREL) 10-40 MG capsule Take 1 capsule by mouth daily. 90 capsule 1  . ARIPiprazole (ABILIFY) 15 MG tablet Take 0.5 tablets  (7.5 mg total) by mouth daily. 45 tablet 1  . b complex vitamins tablet Take 1 tablet by mouth daily.    . cyclobenzaprine (FLEXERIL) 10 MG tablet Take 1 tablet by mouth as directed.  0  . ferrous sulfate 325 (65 FE) MG tablet Take 1 tablet (325 mg total) by mouth 2 (two) times daily with a meal. 60 tablet 0  . fexofenadine (ALLEGRA) 180 MG tablet Take 1 tablet (180 mg total) by mouth daily. 30 tablet 0  . furosemide (LASIX) 20 MG tablet TAKE 1 TABLET DAILY 90 tablet 1  . hydrochlorothiazide (HYDRODIURIL) 25 MG tablet TAKE 1 TABLET DAILY 90 tablet 4  . metoprolol succinate (TOPROL-XL) 50 MG 24 hr tablet Take 1 tablet (50 mg total) by mouth daily. Take with or immediately following a meal. 90 tablet 1  . Multiple Vitamin (MULTIVITAMIN WITH MINERALS) TABS tablet Take 1 tablet by mouth daily.    . Nebulizers (VIOS AEROSOL DELIVERY SYSTEM) MISC 1 each as directed.  0  . omeprazole (PRILOSEC) 40 MG capsule Take 1 capsule (40 mg total) by mouth daily. 90 capsule 0  . potassium chloride (K-DUR,KLOR-CON) 10 MEQ tablet Take 1 tablet (10 mEq total) by mouth daily. 30 tablet 0  . sertraline (ZOLOFT) 100 MG tablet Take 1 tablet (100 mg total) by mouth daily. 90 tablet 1  . ALPRAZolam (XANAX) 0.5 MG tablet Take 0.5 tablets (0.25 mg total) by mouth 2 (two) times daily as needed for anxiety. (Patient not taking: Reported on 08/29/2018) 30 tablet 0  . meloxicam (MOBIC) 7.5 MG tablet Take 1 tablet (7.5 mg total) by mouth daily. (Patient not taking: Reported on  08/29/2018) 30 tablet 0  . potassium gluconate 595 (99 K) MG TABS tablet Take by mouth.     No current facility-administered medications for this visit.     Medication Side Effects: None  Allergies: No Known Allergies  Past Medical History:  Diagnosis Date  . Anemia 11/16/2014  . Bladder disease    "an unusual bladder disease; don't know what it's called" (06/30/2015)  . Bulging lumbar disc   . GERD (gastroesophageal reflux disease)   . History of  hiatal hernia   . Hyperglycemia 11/16/2014  . Hyperlipemia   . Hypertension   . Joint pain, knee   . Migraine    06/30/2015 "maybe 2-3 times/year; not as bad as I used to have them"  . OSA (obstructive sleep apnea)     Family History  Problem Relation Age of Onset  . Stroke Mother 79       Died of CVA  . Hypertension Mother   . Anesthesia problems Neg Hx   . Hypotension Neg Hx   . Malignant hyperthermia Neg Hx   . Pseudochol deficiency Neg Hx     Social History   Socioeconomic History  . Marital status: Married    Spouse name: Not on file  . Number of children: 3  . Years of education: Not on file  . Highest education level: Not on file  Occupational History  . Not on file  Social Needs  . Financial resource strain: Not on file  . Food insecurity:    Worry: Not on file    Inability: Not on file  . Transportation needs:    Medical: Not on file    Non-medical: Not on file  Tobacco Use  . Smoking status: Former Smoker    Types: Cigarettes  . Smokeless tobacco: Never Used  . Tobacco comment: "stopped smoking in ~ 1990"  Substance and Sexual Activity  . Alcohol use: No    Alcohol/week: 0.0 standard drinks  . Drug use: No  . Sexual activity: Yes  Lifestyle  . Physical activity:    Days per week: Not on file    Minutes per session: Not on file  . Stress: Not on file  Relationships  . Social connections:    Talks on phone: Not on file    Gets together: Not on file    Attends religious service: Not on file    Active member of club or organization: Not on file    Attends meetings of clubs or organizations: Not on file    Relationship status: Not on file  . Intimate partner violence:    Fear of current or ex partner: Not on file    Emotionally abused: Not on file    Physically abused: Not on file    Forced sexual activity: Not on file  Other Topics Concern  . 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   Please see review of systems for further details on the patient's review from today.   Objective:   Physical Exam:  There were no vitals taken for this visit.  Physical Exam Constitutional:      General: He is not in acute distress.    Appearance: He is well-developed.  Musculoskeletal:  General: No deformity.  Neurological:     Mental Status: He is alert and oriented to person, place, and time.     Motor: No tremor.     Coordination: Coordination normal.     Gait: Gait normal.  Psychiatric:        Attention and Perception: Attention and perception normal.        Mood and Affect: Mood is not anxious or depressed. Affect is not labile, blunt, angry or inappropriate.        Speech: Speech normal.        Behavior: Behavior normal.        Thought Content: Thought content normal. Thought content does not include homicidal or suicidal ideation. Thought content does not include homicidal or suicidal plan.        Cognition and Memory: Cognition normal.        Judgment: Judgment normal.     Comments: Insight intact. No auditory or visual hallucinations. No delusions.      Lab Review:     Component Value Date/Time   NA 143 01/13/2018 1542   K 3.6 01/13/2018 1542   CL 104 01/13/2018 1542   CO2 31 01/13/2018 1542   GLUCOSE 89 01/13/2018 1542   BUN 22 01/13/2018 1542   CREATININE 1.05 01/13/2018 1542   CALCIUM 9.3 01/13/2018 1542   PROT 6.2 (L) 10/22/2017 0316   ALBUMIN 3.4 (L) 10/22/2017 0316   AST 36 10/22/2017 0316   ALT 76 (H) 10/22/2017 0316   ALKPHOS 73 10/22/2017 0316   BILITOT 0.8 10/22/2017 0316   GFRNONAA >60 10/25/2017 0438   GFRAA >60 10/25/2017 0438       Component Value Date/Time   WBC 12.1 (H) 10/25/2017 0438   RBC 4.26 10/25/2017 0438   HGB 12.8 (L) 10/25/2017 0438    HCT 40.5 10/25/2017 0438   PLT 312 10/25/2017 0438   MCV 95.1 10/25/2017 0438   MCH 30.0 10/25/2017 0438   MCHC 31.6 10/25/2017 0438   RDW 14.1 10/25/2017 0438   LYMPHSABS 2.9 10/25/2017 0438   MONOABS 1.0 10/25/2017 0438   EOSABS 0.1 10/25/2017 0438   BASOSABS 0.0 10/25/2017 0438    No results found for: POCLITH, LITHIUM   No results found for: PHENYTOIN, PHENOBARB, VALPROATE, CBMZ   .res Assessment: Plan:    Generalized anxiety disorder  Panic disorder with agoraphobia  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  He has been back to work and then had to come out of disability again due to back pain.  He also has a history of short-term disability in 2006/11/27 after the death of his mother and brother.  He has had good response to the combination of Zoloft and Abilify.  His panic attacks have stopped and his depression is under control.  He has not been at work since his last visit in September due to his back.  The risk of changing meds is greater than is reasonable and we will continue the current medications.  He will contact us if he has relapse.  Discussed potential metabolic side effects associated with atypical antipsychotics, as well as potential risk for movement side effects. Advised pt to contact office if movement side effects occur.    Follow-up 4 months  Lynder Parents, MD, DFAPA  Please see After Visit Summary for patient specific  instructions.  Future Appointments  Date Time Provider [place]  10/20/2018  4:00 PM [facility] NURSE [facility]  12/07/2018  4:15 PM Rigoberto Noel, MD [facility] None    No orders of the defined types were placed in this encounter.     -------------------------------

## 2018-08-29 NOTE — Telephone Encounter (Signed)
Called in rx's that were sent to [facility] for sertraline and aripiprazole due to not escribing over correctly. Left VM with pharmacy

## 2018-08-30 ENCOUNTER — Telehealth: Payer: Self-pay | Admitting: Family

## 2018-08-30 NOTE — Telephone Encounter (Signed)
Patient dropped off a form to be completed by [name]. Completed form needs to be faxed back to [facility] at [phone] for the patient to return work if provider deems fit. Placed in [place].

## 2018-08-31 NOTE — Telephone Encounter (Signed)
Hi Sharon, do you have this form- it has not come to me yet. Thanks.

## 2018-08-31 NOTE — Telephone Encounter (Signed)
Form forwarded to provider with copy of medication list attached/SLS 01/09

## 2018-09-03 ENCOUNTER — Telehealth: Payer: Self-pay | Admitting: Family

## 2018-09-03 NOTE — Telephone Encounter (Signed)
Received request for clearance to return to work.  Please contact pt and ask him if he followed back up with his spine specialist, Dr. Zada Finders.  If not, I recommend follow up to determine appropriateness for return to work. If he has already seen Dr. Zada Finders then I would like to request a copy of the office visit.

## 2018-09-04 NOTE — Telephone Encounter (Signed)
Talked to patient and he reports he has been cleared by his spine specialist to go back to work. Will ask his employer's physician to send clearance to the spine specialist.

## 2018-09-05 NOTE — Telephone Encounter (Signed)
Talked to [facility] and advised her form dropped of by patient to be sing for him to return to work with no restrictions needs to be sent o his back Dr Marcello Moores A. Ostergard at [facility]. For was faxed back to her at [phone]. She will redirect request to patient's back Dr,

## 2018-09-15 ENCOUNTER — Other Ambulatory Visit: Payer: Self-pay | Admitting: Family

## 2018-09-25 DIAGNOSIS — G4733 Obstructive sleep apnea (adult) (pediatric): Secondary | ICD-10-CM | POA: Diagnosis not present

## 2018-10-20 ENCOUNTER — Ambulatory Visit (INDEPENDENT_AMBULATORY_CARE_PROVIDER_SITE_OTHER): Payer: BLUE CROSS/BLUE SHIELD

## 2018-10-20 DIAGNOSIS — Z23 Encounter for immunization: Secondary | ICD-10-CM | POA: Diagnosis not present

## 2018-10-20 NOTE — Progress Notes (Signed)
Patient is here today for second shingrix vaccination. 0.14mL shingrix given in left deltoid IM. Patient tolerated well. VIS given. Pre visit review using our clinic review tool, if applicable. No additional management support is needed unless otherwise documented below in the visit note.

## 2018-10-24 DIAGNOSIS — G4733 Obstructive sleep apnea (adult) (pediatric): Secondary | ICD-10-CM | POA: Diagnosis not present

## 2018-10-28 ENCOUNTER — Other Ambulatory Visit: Payer: Self-pay | Admitting: Family

## 2018-11-06 ENCOUNTER — Ambulatory Visit (INDEPENDENT_AMBULATORY_CARE_PROVIDER_SITE_OTHER): Payer: BLUE CROSS/BLUE SHIELD | Admitting: Family

## 2018-11-06 ENCOUNTER — Ambulatory Visit: Payer: Self-pay | Admitting: Family

## 2018-11-06 ENCOUNTER — Encounter: Payer: Self-pay | Admitting: Family

## 2018-11-06 ENCOUNTER — Other Ambulatory Visit: Payer: Self-pay

## 2018-11-06 VITALS — BP 136/70 | HR 71 | Temp 98.0°F | Resp 18 | Ht 66.0 in | Wt 256.0 lb

## 2018-11-06 DIAGNOSIS — R062 Wheezing: Secondary | ICD-10-CM

## 2018-11-06 DIAGNOSIS — J45901 Unspecified asthma with (acute) exacerbation: Secondary | ICD-10-CM

## 2018-11-06 MED ORDER — AZITHROMYCIN 250 MG PO TABS: ORAL_TABLET | ORAL | 0 refills | Status: DC

## 2018-11-06 MED ORDER — PREDNISONE 10 MG PO TABS: ORAL_TABLET | ORAL | 0 refills | Status: DC

## 2018-11-06 MED ORDER — METHYLPREDNISOLONE SODIUM SUCC 125 MG IJ SOLR: 125.0000 mg | Freq: Once | INTRAMUSCULAR | Status: DC

## 2018-11-06 MED ORDER — ALBUTEROL SULFATE (2.5 MG/3ML) 0.083% IN NEBU: 2.5000 mg | INHALATION_SOLUTION | Freq: Four times a day (QID) | RESPIRATORY_TRACT | 1 refills | Status: DC | PRN

## 2018-11-06 NOTE — Patient Instructions (Signed)
Please complete your chest x-ray on the first floor. Begin azithromycin (antibiotics). Begin prednisone once daily this afternoon. Continue albuterol every 6 hours until symptoms improve. Go to ER if severe shortness of breath. Call if symptoms are not improved in 3-4 days of if you develop a fever.

## 2018-11-06 NOTE — Telephone Encounter (Signed)
  Pt called in c/o wheezing since Friday.   See triage notes.  Agent made him an appt with [name] for today at 8:05.  Triage notes sent to the practice.   Reason for Disposition . [1] MILD longstanding difficulty breathing AND [2]  SAME as normal  Answer Assessment - Initial Assessment Questions 1. RESPIRATORY STATUS: "Describe your breathing?" (e.g., wheezing, shortness of breath, unable to speak, severe coughing)      No shortness of breath.   Just wheezing most of the time. 2. ONSET: "When did this breathing problem begin?"      Started about Friday. 3. PATTERN "Does the difficult breathing come and go, or has it been constant since it started?"      Wheezing all the time. 4. SEVERITY: "How bad is your breathing?" (e.g., mild, moderate, severe)    - MILD: No SOB at rest, mild SOB with walking, speaks normally in sentences, can lay down, no retractions, pulse < 100.    - MODERATE: SOB at rest, SOB with minimal exertion and prefers to sit, cannot lie down flat, speaks in phrases, mild retractions, audible wheezing, pulse 100-120.    - SEVERE: Very SOB at rest, speaks in single words, struggling to breathe, sitting hunched forward, retractions, pulse > 120      I'm using nebulizer every 6 hours.   It helps. 5. RECURRENT SYMPTOM: "Have you had difficulty breathing before?" If so, ask: "When was the last time?" and "What happened that time?"      Bronchitis last year 6. CARDIAC HISTORY: "Do you have any history of heart disease?" (e.g., heart attack, angina, bypass surgery, angioplasty)      No 7. LUNG HISTORY: "Do you have any history of lung disease?"  (e.g., pulmonary embolus, asthma, emphysema)     No 8. CAUSE: "What do you think is causing the breathing problem?"      Just a cold 9. OTHER SYMPTOMS: "Do you have any other symptoms? (e.g., dizziness, runny nose, cough, chest pain, fever)     Little runny nose not much, I don't feel like I have fever. 10. PREGNANCY: "Is  there any chance you are pregnant?" "When was your last menstrual period?"       N/A 11. TRAVEL: "Have you traveled out of the country in the last month?" (e.g., travel history, exposures)       No travel  Protocols used: BREATHING DIFFICULTY-A-AH

## 2018-11-06 NOTE — Progress Notes (Signed)
Subjective:    [name], male    DOB: 01-24-1956, 63 y.o.   MRN: [id number]  HPI  Patient is a 63 yr old male who presents today with chief complaint of cough/wheezing. Symptoms began Friday.  Denies fever, myalgia. Slight nasal congestion.     Review of Systems    see HPI  Past Medical History:  Diagnosis Date  . Anemia 11/16/2014  . Bladder disease    "an unusual bladder disease; don't know what it's called" (06/30/2015)  . Bulging lumbar disc   . GERD (gastroesophageal reflux disease)   . History of hiatal hernia   . Hyperglycemia 11/16/2014  . Hyperlipemia   . Hypertension   . Joint pain, knee   . Migraine    06/30/2015 "maybe 2-3 times/year; not as bad as I used to have them"  . OSA (obstructive sleep apnea)      Social History   Socioeconomic History  . Marital status: Married    Spouse name: Not on file  . Number of children: 3  . Years of education: Not on file  . Highest education level: Not on file  Occupational History  . Not on file  Social Needs  . Financial resource strain: Not on file  . Food insecurity:    Worry: Not on file    Inability: Not on file  . Transportation needs:    Medical: Not on file    Non-medical: Not on file  Tobacco Use  . Smoking status: Former Smoker    Types: Cigarettes  . Smokeless tobacco: Never Used  . Tobacco comment: "stopped smoking in ~ 1990"  Substance and Sexual Activity  . Alcohol use: No    Alcohol/week: 0.0 standard drinks  . Drug use: No  . Sexual activity: Yes  Lifestyle  . Physical activity:    Days per week: Not on file    Minutes per session: Not on file  . Stress: Not on file  Relationships  . Social connections:    Talks on phone: Not on file    Gets together: Not on file    Attends religious service: Not on file    Active member of club or organization: Not on file    Attends meetings of clubs or organizations: Not on file    Relationship status: Not on file  . Intimate  partner violence:    Fear of current or ex partner: Not on file    Emotionally abused: Not on file    Physically abused: Not on file    Forced sexual activity: Not on file  Other Topics Concern  . 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 ESOPHAGOGASTRODUODENOSCOPY    . ESOPHAGOGASTRODUODENOSCOPY (EGD) WITH ESOPHAGEAL DILATION  ~ 2014  . KNEE ARTHROSCOPY Right 1995  . NASAL SEPTUM SURGERY  ~ 1972  . TOTAL KNEE ARTHROPLASTY  10/11/2011   Procedure: TOTAL KNEE ARTHROPLASTY;  Surgeon: Rudean Haskell, MD;  Location: [facility];  Service: Orthopedics;  Laterality: Right;  . 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 Stroke Mother 38       Died of CVA  . Hypertension Mother   .  Anesthesia problems Neg Hx   . Hypotension Neg Hx   . Malignant hyperthermia Neg Hx   . Pseudochol deficiency Neg Hx     No Known Allergies  Current Outpatient Medications on File Prior to Visit  Medication Sig Dispense Refill  . albuterol (PROVENTIL HFA;VENTOLIN HFA) 108 (90 Base) MCG/ACT inhaler Inhale 2 puffs into the lungs every 6 (six) hours as needed for wheezing or shortness of breath. 1 Inhaler 2  . albuterol (PROVENTIL) (2.5 MG/3ML) 0.083% nebulizer solution Take 3 mLs (2.5 mg total) by nebulization every 6 (six) hours as needed for wheezing or shortness of breath. 150 mL 1  . ALPRAZolam (XANAX) 0.5 MG tablet Take 0.5 tablets (0.25 mg total) by mouth 2 (two) times daily as needed for anxiety. 30 tablet 0  . amLODipine-benazepril (LOTREL) 10-40 MG capsule TAKE 1 CAPSULE DAILY 90 capsule 4  . ARIPiprazole (ABILIFY) 15 MG tablet Take 0.5 tablets (7.5 mg total) by mouth daily. 45 tablet 1  . b complex vitamins tablet Take 1 tablet by mouth daily.    .  cyclobenzaprine (FLEXERIL) 10 MG tablet Take 1 tablet by mouth as directed.  0  . ferrous sulfate 325 (65 FE) MG tablet Take 1 tablet (325 mg total) by mouth 2 (two) times daily with a meal. 60 tablet 0  . fexofenadine (ALLEGRA) 180 MG tablet Take 1 tablet (180 mg total) by mouth daily. 30 tablet 0  . furosemide (LASIX) 20 MG tablet TAKE 1 TABLET DAILY 90 tablet 1  . hydrochlorothiazide (HYDRODIURIL) 25 MG tablet TAKE 1 TABLET DAILY 90 tablet 4  . meloxicam (MOBIC) 7.5 MG tablet Take 1 tablet (7.5 mg total) by mouth daily. 30 tablet 0  . metoprolol succinate (TOPROL-XL) 50 MG 24 hr tablet Take 1 tablet (50 mg total) by mouth daily. Take with or immediately following a meal. 90 tablet 1  . Multiple Vitamin (MULTIVITAMIN WITH MINERALS) TABS tablet Take 1 tablet by mouth daily.    . Nebulizers (VIOS AEROSOL DELIVERY SYSTEM) MISC 1 each as directed.  0  . omeprazole (PRILOSEC) 40 MG capsule Take 1 capsule (40 mg total) by mouth daily. 90 capsule 0  . potassium chloride (K-DUR,KLOR-CON) 10 MEQ tablet Take 1 tablet (10 mEq total) by mouth daily. 30 tablet 0  . potassium gluconate 595 (99 K) MG TABS tablet Take by mouth.    . sertraline (ZOLOFT) 100 MG tablet Take 1 tablet (100 mg total) by mouth daily. 90 tablet 1   No current facility-administered medications on file prior to visit.     BP 136/70 (BP Location: Right Arm, Patient Position: Sitting, Cuff Size: Large)   Pulse 71   Temp 98 F (36.7 C) (Oral)   Resp 18   Ht 5\' 6"  (1.676 m)   Wt 256 lb (116.1 kg)   SpO2 97%   BMI 41.32 kg/m    Objective:   Physical Exam Constitutional:      General: He is not in acute distress.    Appearance: He is well-developed.  HENT:     Head: Normocephalic and atraumatic.     Right Ear: Tympanic membrane and ear canal normal.     Left Ear: Tympanic membrane and ear canal normal.  Cardiovascular:     Rate and Rhythm: Normal rate and regular rhythm.     Heart sounds: No murmur.  Pulmonary:      Effort: Pulmonary effort is normal. No respiratory distress.     Breath sounds: Wheezing and rhonchi present.  No rales.  Skin:    General: Skin is warm and dry.  Neurological:     Mental Status: He is alert and oriented to person, place, and time.  Psychiatric:        Behavior: Behavior normal.        Thought Content: Thought content normal.           Assessment & Plan:  Acute asthma exacerbation-  Wt Readings from Last 3 Encounters:  11/06/18 256 lb (116.1 kg)  08/21/18 252 lb (114.3 kg)  08/07/18 249 lb (112.9 kg)   He is up a few pounds as well. Will obtain cxr to rule out PNA/pulmonary edema. IM solumedrol in the office today.  Pt is advised as follows:  Please complete your chest x-ray on the first floor. Begin azithromycin (antibiotics). Begin prednisone once daily this afternoon. Continue albuterol every 6 hours until symptoms improve. Go to ER if severe shortness of breath. Call if symptoms are not improved in 3-4 days of if you develop a fever.

## 2018-11-25 ENCOUNTER — Other Ambulatory Visit: Payer: Self-pay | Admitting: Family

## 2018-11-26 ENCOUNTER — Telehealth: Payer: Self-pay | Admitting: Family

## 2018-11-26 NOTE — Telephone Encounter (Signed)
Please contact pt and schedule routine follow up visit.

## 2018-11-28 ENCOUNTER — Other Ambulatory Visit: Payer: Self-pay

## 2018-11-28 ENCOUNTER — Ambulatory Visit (INDEPENDENT_AMBULATORY_CARE_PROVIDER_SITE_OTHER): Payer: BLUE CROSS/BLUE SHIELD | Admitting: Family

## 2018-11-28 DIAGNOSIS — R739 Hyperglycemia, unspecified: Secondary | ICD-10-CM | POA: Diagnosis not present

## 2018-11-28 DIAGNOSIS — F419 Anxiety disorder, unspecified: Secondary | ICD-10-CM | POA: Diagnosis not present

## 2018-11-28 DIAGNOSIS — J45909 Unspecified asthma, uncomplicated: Secondary | ICD-10-CM

## 2018-11-28 DIAGNOSIS — E785 Hyperlipidemia, unspecified: Secondary | ICD-10-CM | POA: Diagnosis not present

## 2018-11-28 DIAGNOSIS — I10 Essential (primary) hypertension: Secondary | ICD-10-CM | POA: Diagnosis not present

## 2018-11-28 DIAGNOSIS — F32A Depression, unspecified: Secondary | ICD-10-CM

## 2018-11-28 DIAGNOSIS — F329 Major depressive disorder, single episode, unspecified: Secondary | ICD-10-CM

## 2018-11-28 DIAGNOSIS — K219 Gastro-esophageal reflux disease without esophagitis: Secondary | ICD-10-CM

## 2018-11-28 NOTE — Progress Notes (Signed)
Virtual Visit via Video Note  I connected with Jeremy [name] on 11/28/18 at  2:20 PM EDT by a video enabled telemedicine application and verified that I am speaking with the correct person using two identifiers. This visit type was conducted due to national recommendations for restrictions regarding the COVID-19 Pandemic (e.g. social distancing).  This format is felt to be most appropriate for this patient at this time.   I discussed the limitations of evaluation and management by telemedicine and the availability of in person appointments. The patient expressed understanding and agreed to proceed.  Only the patient and myself were on today's video visit. The patient was at home and I was in my office at the time of today's visit.   History of Present Illness:   DM2-  Continues to work on diabetic diet. Lab Results  Component Value Date   HGBA1C 5.8 08/22/2017   HGBA1C 5.8 11/29/2016   HGBA1C 5.6 05/03/2016   Lab Results  Component Value Date   LDLCALC 139 (H) 06/30/2015   CREATININE 1.05 01/13/2018   HTN- maintained on lotrel, hctz and toprol xl.  BP Readings from Last 3 Encounters:  11/06/18 136/70  08/21/18 135/74  08/07/18 118/80   Continues lasix once daily. Denies LE edema.  Depression- he is still following with Dr. Clovis Pu. Reports that his mood has been good.    GERD- reports symptoms are well controlled on omeprazole.  Asthma- reports that he is taking allegra.   Observations/Objective:   Gen: Awake, alert, no acute distress Resp: Breathing is even and non-labored Psych: calm/pleasant demeanor Neuro: Alert and Oriented x 3, + facial symmetry, speech is clear.  Assessment and Plan:  HTN- bp stable, continue current meds.  hyperglycemia- obtain follow up a1c, clinically stable.   Depression- stable- following with psychiaty.  Asthma- reports symptoms are stable no recent wheezing.  GERD- stable on PPI  Follow Up Instructions:  3 months in person.     I discussed the assessment and treatment plan with the patient. The patient was provided an opportunity to ask questions and all were answered. The patient agreed with the plan and demonstrated an understanding of the instructions.   The patient was advised to call back or seek an in-person evaluation if the symptoms worsen or if the condition fails to improve as anticipated.    Nance Pear, NP

## 2018-11-28 NOTE — Telephone Encounter (Signed)
Patient scheduled for today at 2:20

## 2018-12-07 ENCOUNTER — Ambulatory Visit: Payer: BLUE CROSS/BLUE SHIELD | Admitting: Pulmonary Disease

## 2018-12-15 ENCOUNTER — Telehealth: Payer: Self-pay | Admitting: Family

## 2018-12-15 NOTE — Telephone Encounter (Signed)
Pt dropped off document for provider to fill out ([place] - 5 pages) Pt would like document to be faxed to [phone] when done. If there is any question can call Megan at [phone]. Document put at front office tray under providers name.

## 2018-12-22 NOTE — Telephone Encounter (Signed)
Paperwork received; will begin working on it to finish on Monday, if nothing new poses an issue/SLS 05/01

## 2018-12-28 ENCOUNTER — Ambulatory Visit: Payer: BLUE CROSS/BLUE SHIELD | Admitting: Psychiatry

## 2019-01-01 ENCOUNTER — Telehealth: Payer: Self-pay | Admitting: Family

## 2019-01-01 NOTE — Telephone Encounter (Signed)
Please contact pt and let him know that I received his disability paperwork.  Has he returned to work and if so, when did he return?   If he has not returned to work then I would recommend that he request completion of disability paperwork from his spine specialist.   If he has already returned to work I will complete the form.

## 2019-01-03 ENCOUNTER — Encounter: Payer: Self-pay | Admitting: Psychiatry

## 2019-01-03 ENCOUNTER — Ambulatory Visit (INDEPENDENT_AMBULATORY_CARE_PROVIDER_SITE_OTHER): Payer: BLUE CROSS/BLUE SHIELD | Admitting: Psychiatry

## 2019-01-03 ENCOUNTER — Other Ambulatory Visit: Payer: Self-pay

## 2019-01-03 DIAGNOSIS — F3342 Major depressive disorder, recurrent, in full remission: Secondary | ICD-10-CM

## 2019-01-03 DIAGNOSIS — F411 Generalized anxiety disorder: Secondary | ICD-10-CM

## 2019-01-03 DIAGNOSIS — F4001 Agoraphobia with panic disorder: Secondary | ICD-10-CM

## 2019-01-03 MED ORDER — ARIPIPRAZOLE 15 MG PO TABS: 7.5000 mg | ORAL_TABLET | Freq: Every day | ORAL | 1 refills | Status: DC

## 2019-01-03 MED ORDER — SERTRALINE HCL 100 MG PO TABS: 100.0000 mg | ORAL_TABLET | Freq: Every day | ORAL | 1 refills | Status: DC

## 2019-01-03 NOTE — Progress Notes (Signed)
Jeremy [name] [id number] [birth year] 63 y.o.   Virtual Visit via Telephone Note  I connected with pt by telephone and verified that I am speaking with the correct person using two identifiers.   I discussed the limitations, risks, security and privacy concerns of performing an evaluation and management service by telephone and the availability of in person appointments. I also discussed with the patient that there may be a patient responsible charge related to this service. The patient expressed understanding and agreed to proceed.  I discussed the assessment and treatment plan with the patient. The patient was provided an opportunity to ask questions and all were answered. The patient agreed with the plan and demonstrated an understanding of the instructions.   The patient was advised to call back or seek an in-person evaluation if the symptoms worsen or if the condition fails to improve as anticipated.  I provided 15 minutes of non-face-to-face time during this encounter. The call started at 1130 and ended at 1145. The patient was located at home and the provider was located office.   Subjective:   Patient ID:  Jeremy [name] is a 63 y.o. (DOB 05/12/1956) male.  Chief Complaint:  Chief Complaint  Patient presents with  . Follow-up    med management    HPI  Jeremy [name] presents  today for follow-up of anxiety and depression.  He was last seen August 29, 2018.  No meds were changed.  He was getting benefit from the medication.  He had been out on disability due to his back since September at that time.  Still good.  Benefit from meds. Help depression, anxiety and anger.  They are all under control.  Patient reports stable mood and denies depressed or irritable moods.  Patient denies any recent difficulty with anxiety.  Patient denies difficulty with sleep initiation or maintenance. Denies appetite disturbance.  Patient reports that energy and motivation have been good.   Patient denies any difficulty with concentration.  Patient denies any suicidal ideation.  Went back to work the other day but taken back out DT need for physical to clear him.  Out of work since August Still with his back. Pursuing disability for his back.  Past history of psychiatric disability.  Review of Systems:  Review of Systems  Musculoskeletal: Positive for arthralgias and back pain.  Psychiatric/Behavioral: Negative for agitation, behavioral problems, confusion, decreased concentration, dysphoric mood, hallucinations, self-injury, sleep disturbance and suicidal ideas. The patient is not nervous/anxious and is not hyperactive.     Medications: I have reviewed the patient's current medications.  Current Outpatient Medications  Medication Sig Dispense Refill  . albuterol (PROVENTIL) (2.5 MG/3ML) 0.083% nebulizer solution Take 3 mLs (2.5 mg total) by nebulization every 6 (six) hours as needed for wheezing or shortness of breath. 150 mL 1  . amLODipine-benazepril (LOTREL) 10-40 MG capsule TAKE 1 CAPSULE DAILY 90 capsule 4  . ARIPiprazole (ABILIFY) 15 MG tablet Take 0.5 tablets (7.5 mg total) by mouth daily. 45 tablet 1  . b complex vitamins tablet Take 1 tablet by mouth daily.    . cyclobenzaprine (FLEXERIL) 10 MG tablet Take 1 tablet by mouth as directed.  0  . ferrous sulfate 325 (65 FE) MG tablet Take 1 tablet (325 mg total) by mouth 2 (two) times daily with a meal. 60 tablet 0  . fexofenadine (ALLEGRA) 180 MG tablet Take 1 tablet (180 mg total) by mouth daily. 30 tablet 0  . furosemide (LASIX) 20 MG tablet TAKE  1 TABLET DAILY 90 tablet 1  . hydrochlorothiazide (HYDRODIURIL) 25 MG tablet TAKE 1 TABLET DAILY 90 tablet 4  . meloxicam (MOBIC) 7.5 MG tablet Take 1 tablet (7.5 mg total) by mouth daily. 30 tablet 0  . metoprolol succinate (TOPROL-XL) 50 MG 24 hr tablet TAKE 1 TABLET DAILY WITH OR IMMEDIATELY FOLLOWING A MEAL. 90 tablet 0  . omeprazole (PRILOSEC) 40 MG capsule Take 1  capsule (40 mg total) by mouth daily. 90 capsule 0  . potassium chloride (K-DUR,KLOR-CON) 10 MEQ tablet Take 1 tablet (10 mEq total) by mouth daily. 30 tablet 0  . potassium gluconate 595 (99 K) MG TABS tablet Take by mouth.    . sertraline (ZOLOFT) 100 MG tablet Take 1 tablet (100 mg total) by mouth daily. 90 tablet 1  . albuterol (PROVENTIL HFA;VENTOLIN HFA) 108 (90 Base) MCG/ACT inhaler Inhale 2 puffs into the lungs every 6 (six) hours as needed for wheezing or shortness of breath. 1 Inhaler 2  . ALPRAZolam (XANAX) 0.5 MG tablet Take 0.5 tablets (0.25 mg total) by mouth 2 (two) times daily as needed for anxiety. (Patient not taking: Reported on 01/03/2019) 30 tablet 0  . Multiple Vitamin (MULTIVITAMIN WITH MINERALS) TABS tablet Take 1 tablet by mouth daily.    . Nebulizers (VIOS AEROSOL DELIVERY SYSTEM) MISC 1 each as directed.  0   No current facility-administered medications for this visit.     Medication Side Effects: ED with Zoloft.  Viagra failed.  Using shots.  Allergies: No Known Allergies  Past Medical History:  Diagnosis Date  . Anemia 11/16/2014  . Bladder disease    "an unusual bladder disease; don't know what it's called" (06/30/2015)  . Bulging lumbar disc   . GERD (gastroesophageal reflux disease)   . History of hiatal hernia   . Hyperglycemia 11/16/2014  . Hyperlipemia   . Hypertension   . Joint pain, knee   . Migraine    06/30/2015 "maybe 2-3 times/year; not as bad as I used to have them"  . OSA (obstructive sleep apnea)     Family History  Problem Relation Age of Onset  . Stroke Mother 77       Died of CVA  . Hypertension Mother   . Anesthesia problems Neg Hx   . Hypotension Neg Hx   . Malignant hyperthermia Neg Hx   . Pseudochol deficiency Neg Hx     Social History   Socioeconomic History  . Marital status: Married    Spouse name: Not on file  . Number of children: 3  . Years of education: Not on file  . Highest education level: Not on file   Occupational History  . Not on file  Social Needs  . Financial resource strain: Not on file  . Food insecurity:    Worry: Not on file    Inability: Not on file  . Transportation needs:    Medical: Not on file    Non-medical: Not on file  Tobacco Use  . Smoking status: Former Smoker    Types: Cigarettes  . Smokeless tobacco: Never Used  . Tobacco comment: "stopped smoking in ~ 1990"  Substance and Sexual Activity  . Alcohol use: No    Alcohol/week: 0.0 standard drinks  . Drug use: No  . Sexual activity: Yes  Lifestyle  . Physical activity:    Days per week: Not on file    Minutes per session: Not on file  . Stress: Not on file  Relationships  .  Social connections:    Talks on phone: Not on file    Gets together: Not on file    Attends religious service: Not on file    Active member of club or organization: Not on file    Attends meetings of clubs or organizations: Not on file    Relationship status: Not on file  . Intimate partner violence:    Fear of current or ex partner: Not on file    Emotionally abused: Not on file    Physically abused: Not on file    Forced sexual activity: Not on file  Other Topics Concern  . 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   Please see review of systems for further details on the patient's review from today.   Objective:   Physical Exam:  There were no vitals taken for this visit.  Physical Exam Constitutional:      General: He is not in acute distress.    Appearance: He is well-developed.  Musculoskeletal:        General: No deformity.  Neurological:     Mental Status: He is alert and oriented to person, place, and time.     Motor: No tremor.     Coordination: Coordination normal.      Gait: Gait normal.  Psychiatric:        Attention and Perception: Attention and perception normal.        Mood and Affect: Mood is not anxious or depressed. Affect is not labile, blunt, angry or inappropriate.        Speech: Speech normal.        Behavior: Behavior normal.        Thought Content: Thought content normal. Thought content does not include homicidal or suicidal ideation. Thought content does not include homicidal or suicidal plan.        Cognition and Memory: Cognition normal.        Judgment: Judgment normal.     Comments: Insight intact. No auditory or visual hallucinations. No delusions.      Lab Review:     Component Value Date/Time   NA 143 01/13/2018 1542   K 3.6 01/13/2018 1542   CL 104 01/13/2018 1542   CO2 31 01/13/2018 1542   GLUCOSE 89 01/13/2018 1542   BUN 22 01/13/2018 1542   CREATININE 1.05 01/13/2018 1542   CALCIUM 9.3 01/13/2018 1542   PROT 6.2 (L) 10/22/2017 0316   ALBUMIN 3.4 (L) 10/22/2017 0316   AST 36 10/22/2017 0316   ALT 76 (H) 10/22/2017 0316   ALKPHOS 73 10/22/2017 0316   BILITOT 0.8 10/22/2017 0316   GFRNONAA >60 10/25/2017 0438   GFRAA >60 10/25/2017 0438       Component Value Date/Time   WBC 12.1 (H) 10/25/2017 0438   RBC 4.26 10/25/2017 0438   HGB 12.8 (L) 10/25/2017 0438   HCT 40.5 10/25/2017 0438   PLT 312 10/25/2017 0438   MCV 95.1 10/25/2017 0438   MCH 30.0 10/25/2017 0438   MCHC 31.6 10/25/2017 0438   RDW 14.1 10/25/2017 0438   LYMPHSABS 2.9 10/25/2017 0438   MONOABS 1.0 10/25/2017 0438   EOSABS 0.1 10/25/2017 0438   BASOSABS 0.0 10/25/2017 0438  No results found for: POCLITH, LITHIUM   No results found for: PHENYTOIN, PHENOBARB, VALPROATE, CBMZ   .res Assessment: Plan:    Depression, major, recurrent, in complete remission ([place])  Panic disorder with agoraphobia  Generalized anxiety disorder  Jeremy [name] has a history of severe depression and panic disorder and anxiety that caused him to go out on disability in  11/15/2017.  He has been back to work and then had to come out of disability again due to back pain.  He also has a history of short-term disability in Nov 16, 2006 after the death of his mother and brother.  He has had good response to the combination of Zoloft and Abilify.  His panic attacks have stopped and his depression is under control.  He has not been at work since his last visit in September due to his back and is pursuing LTD for back.  The risk of changing meds is greater than is reasonable and we will continue the current medications.  He will contact us if he has relapse.  Discussed potential metabolic side effects associated with atypical antipsychotics, as well as potential risk for movement side effects. Advised pt to contact office if movement side effects occur.   No med changes indicated at this time.  If he does get disability 1 would assume that his stress level will go down and we may try reducing the Abilify.  Follow-up 5 months  Lynder Parents, MD, DFAPA  Please see After Visit Summary for patient specific instructions.  No future appointments.  No orders of the defined types were placed in this encounter.     -------------------------------

## 2019-01-08 ENCOUNTER — Other Ambulatory Visit: Payer: Self-pay

## 2019-01-08 ENCOUNTER — Encounter: Payer: Self-pay | Admitting: Pulmonary Disease

## 2019-01-08 ENCOUNTER — Ambulatory Visit (INDEPENDENT_AMBULATORY_CARE_PROVIDER_SITE_OTHER): Payer: BLUE CROSS/BLUE SHIELD | Admitting: Pulmonary Disease

## 2019-01-08 DIAGNOSIS — I5032 Chronic diastolic (congestive) heart failure: Secondary | ICD-10-CM

## 2019-01-08 DIAGNOSIS — G4733 Obstructive sleep apnea (adult) (pediatric): Secondary | ICD-10-CM | POA: Diagnosis not present

## 2019-01-08 NOTE — Progress Notes (Signed)
   Subjective:    [name], male    DOB: December 10, 1955, 63 y.o.   MRN: [id number]  HPI  63 yo for follow-up of severe OSA. He was started on auto CPAP  and seems to have adjusted well up with a full facemask.   CPAP download 03/2018 showed residual AHI of 11/hour. CPAP download 07/2018 residual AHI of 20/hour with centrals about 9/hour and obstructives about 7/hour with mild leak and average pressure of 14 cm. >> changed to auto 10-18 cm  Weight is unchanged He overall feels much improved over the past year, has more energy, denies sleep pressure.  He is found a better fitting full facemask and is very pleased with the results. No problems with pressure. CPAP download was reviewed on auto 10 to 18 cm and shows average pressure of 16 cm with residual AHI 17/hour with obstructive 5/hour and centrals 9/hour  Significant tests/ events reviewed  NPSG 11/2017  severe OSA with AHI 45/hour with lowest desaturation of 71% >> titrated to bilevel 23/19  Review of Systems Patient denies significant dyspnea,cough, hemoptysis,  chest pain, palpitations, pedal edema, orthopnea, paroxysmal nocturnal dyspnea, lightheadedness, nausea, vomiting, abdominal or  leg pains      Objective:   Physical Exam  Gen. Pleasant, obese, in no distress ENT - no lesions, no post nasal drip Neck: No JVD, no thyromegaly, no carotid bruits Lungs: no use of accessory muscles, no dullness to percussion, decreased without rales or rhonchi  Cardiovascular: Rhythm regular, heart sounds  normal, no murmurs or gallops, no peripheral edema Musculoskeletal: No deformities, no cyanosis or clubbing , no tremors        Assessment & Plan:

## 2019-01-08 NOTE — Telephone Encounter (Signed)
Per patient he is now on long term disability, patient advised Melissa recommends to file paperwork with his spine specialist. Patient will follow up as advised.

## 2019-01-08 NOTE — Assessment & Plan Note (Signed)
Appears well compensated with no overt signs of heart failure

## 2019-01-08 NOTE — Telephone Encounter (Signed)
Lm for patient to call back about this 

## 2019-01-08 NOTE — Addendum Note (Signed)
Addended by: Collier Salina on: 01/08/2019 02:53 PM   Modules accepted: Orders

## 2019-01-08 NOTE — Assessment & Plan Note (Signed)
Change CPAP settings to 12 to 17 cm and check download in 1 month. CPAP supplies will be renewed for a year  He still has some treatment emergent centrals but he is overall asymptomatic and this will accept unless things go more than 10/hour in which case we may have to consider ASV mode. I will tighten his range of auto pressures and limit upper pressures such that centrals do not go higher

## 2019-01-08 NOTE — Patient Instructions (Signed)
Change CPAP settings to 12 to 17 cm and check download in 1 month. CPAP supplies will be renewed for a year

## 2019-01-16 NOTE — Telephone Encounter (Signed)
Patient called stating he needs to speak to Rod Holler regarding his disability paper work. Patient call back # [phone]

## 2019-01-16 NOTE — Telephone Encounter (Signed)
Patient reports his back doctor is no longer at the same place. Patient advised his spine specialist clinic has all of his records for his back problems and they should assigned him another physician. His long term disability should be filled out by them per Melissa.

## 2019-01-29 ENCOUNTER — Other Ambulatory Visit: Payer: Self-pay | Admitting: Family

## 2019-02-01 ENCOUNTER — Telehealth: Payer: Self-pay | Admitting: Psychiatry

## 2019-02-01 NOTE — Telephone Encounter (Signed)
Patient left a message saying he would like to talk to dr. Clovis Pu. Please call him at [phone]

## 2019-02-02 NOTE — Telephone Encounter (Signed)
Pt. Made aware and will try this option instead of stopping medications.

## 2019-02-02 NOTE — Telephone Encounter (Signed)
Please get some information as to the nature of the call.  What are his symptoms ? what are his questions?

## 2019-02-02 NOTE — Telephone Encounter (Signed)
Spoke with pt. And he stated that at the end of July he will no longer have insurance. He is wanting to wean off the medication until he can get new insurance. Please advise.

## 2019-02-02 NOTE — Telephone Encounter (Signed)
Patient had a severe episode of depression in 2019 and was disabled for a period of time related to it.  He is very likely to relapse if he comes off medication.  He can get both Abilify and sertraline using [facility] for a total cost of about $20/month.  Don't stop meds.

## 2019-02-08 ENCOUNTER — Telehealth: Payer: Self-pay | Admitting: Family

## 2019-02-08 NOTE — Telephone Encounter (Signed)
Forms put in provider's folder

## 2019-02-08 NOTE — Telephone Encounter (Signed)
Patient dropped off long term disability forms to be filled out by [facility]. Placed in provider tray.

## 2019-02-19 ENCOUNTER — Telehealth: Payer: Self-pay | Admitting: Family

## 2019-02-19 NOTE — Telephone Encounter (Signed)
Information given to patient he verbalized understanding.

## 2019-02-19 NOTE — Telephone Encounter (Signed)
Patient dropped off long term disability paperwork for his back.  Please advise him that since I am not managing his back pain and he is seeing a specialist for this he will need to have this paperwork completed by his back specialist.

## 2019-02-23 ENCOUNTER — Other Ambulatory Visit: Payer: Self-pay | Admitting: Family

## 2019-05-24 ENCOUNTER — Other Ambulatory Visit: Payer: Self-pay

## 2019-05-24 ENCOUNTER — Ambulatory Visit (INDEPENDENT_AMBULATORY_CARE_PROVIDER_SITE_OTHER): Payer: BC Managed Care – PPO

## 2019-05-24 DIAGNOSIS — Z23 Encounter for immunization: Secondary | ICD-10-CM

## 2019-06-05 ENCOUNTER — Ambulatory Visit (INDEPENDENT_AMBULATORY_CARE_PROVIDER_SITE_OTHER): Payer: BC Managed Care – PPO | Admitting: Psychiatry

## 2019-06-05 ENCOUNTER — Other Ambulatory Visit: Payer: Self-pay

## 2019-06-05 ENCOUNTER — Encounter: Payer: Self-pay | Admitting: Psychiatry

## 2019-06-05 DIAGNOSIS — F4001 Agoraphobia with panic disorder: Secondary | ICD-10-CM

## 2019-06-05 DIAGNOSIS — F3342 Major depressive disorder, recurrent, in full remission: Secondary | ICD-10-CM

## 2019-06-05 DIAGNOSIS — F411 Generalized anxiety disorder: Secondary | ICD-10-CM | POA: Diagnosis not present

## 2019-06-05 MED ORDER — SERTRALINE HCL 100 MG PO TABS: 100.0000 mg | ORAL_TABLET | Freq: Every day | ORAL | 1 refills | Status: DC

## 2019-06-05 MED ORDER — ARIPIPRAZOLE 5 MG PO TABS: 5.0000 mg | ORAL_TABLET | Freq: Every day | ORAL | 0 refills | Status: DC

## 2019-06-05 NOTE — Progress Notes (Signed)
Jeremy [name] [id number] 01-Jan-1956 63 y.o.     Subjective:   Patient ID:  Jeremy [name] is a 63 y.o. (DOB 1956-07-07) male.  Chief Complaint:  Chief Complaint  Patient presents with  . Follow-up    Medication Management  . Anxiety    Medication Management  . Depression    Medication Management    Anxiety Patient reports no confusion, decreased concentration, nervous/anxious behavior or suicidal ideas.    Depression        Associated symptoms include no decreased concentration and no suicidal ideas.  Past medical history includes anxiety.     Jeremy [name] presents  today for follow-up of anxiety and depression.   He had been out on disability due to his back since September at that time.  Last seen May 2020.  No med changes were made.  Has stayed on meds since here.  Sertraline 100 and Abilify 7.5 mg.  Still good.  Benefit from meds. Help depression, anxiety and anger.  They are all under control. No mood swings nor prolonged depression.  Anxiety OK.    Not sure how many episodes of depression he's had in life.  Historically about the same levels of depression and anxiety  Patient reports stable mood and denies depressed or irritable moods.  Patient denies any recent difficulty with anxiety.  Patient denies difficulty with sleep initiation or maintenance. Denies appetite disturbance.  Patient reports that energy and motivation have been good.  Patient denies any difficulty with concentration.  Patient denies any suicidal ideation.  Went back to work the other day but taken back out DT need for physical to clear him.  Out of work since August Still with his back. Pursuing disability for his back.   Has retired.  Has reduced his stress significantly.  Past history of psychiatric disability.  Review of Systems:  Review of Systems  Musculoskeletal: Positive for arthralgias and back pain.  Psychiatric/Behavioral: Positive for depression. Negative for agitation,  behavioral problems, confusion, decreased concentration, dysphoric mood, hallucinations, self-injury, sleep disturbance and suicidal ideas. The patient is not nervous/anxious and is not hyperactive.     Medications: I have reviewed the patient's current medications.  Current Outpatient Medications  Medication Sig Dispense Refill  . albuterol (PROVENTIL HFA;VENTOLIN HFA) 108 (90 Base) MCG/ACT inhaler Inhale 2 puffs into the lungs every 6 (six) hours as needed for wheezing or shortness of breath. 1 Inhaler 2  . albuterol (PROVENTIL) (2.5 MG/3ML) 0.083% nebulizer solution Take 3 mLs (2.5 mg total) by nebulization every 6 (six) hours as needed for wheezing or shortness of breath. 150 mL 1  . ALPRAZolam (XANAX) 0.5 MG tablet Take 0.5 tablets (0.25 mg total) by mouth 2 (two) times daily as needed for anxiety. 30 tablet 0  . amLODipine-benazepril (LOTREL) 10-40 MG capsule TAKE 1 CAPSULE DAILY 90 capsule 4  . b complex vitamins tablet Take 1 tablet by mouth daily.    . cyclobenzaprine (FLEXERIL) 10 MG tablet Take 1 tablet by mouth as directed.  0  . ferrous sulfate 325 (65 FE) MG tablet Take 1 tablet (325 mg total) by mouth 2 (two) times daily with a meal. 60 tablet 0  . fexofenadine (ALLEGRA) 180 MG tablet Take 1 tablet (180 mg total) by mouth daily. 30 tablet 0  . furosemide (LASIX) 20 MG tablet TAKE 1 TABLET DAILY 90 tablet 1  . hydrochlorothiazide (HYDRODIURIL) 25 MG tablet TAKE 1 TABLET DAILY 90 tablet 4  . meloxicam (MOBIC) 7.5 MG tablet  Take 1 tablet (7.5 mg total) by mouth daily. 30 tablet 0  . metoprolol succinate (TOPROL-XL) 50 MG 24 hr tablet TAKE 1 TABLET DAILY WITH OR IMMEDIATELY FOLLOWING A MEAL. 90 tablet 1  . Multiple Vitamin (MULTIVITAMIN WITH MINERALS) TABS tablet Take 1 tablet by mouth daily.    . Nebulizers (VIOS AEROSOL DELIVERY SYSTEM) MISC 1 each as directed.  0  . omeprazole (PRILOSEC) 40 MG capsule Take 1 capsule (40 mg total) by mouth daily. 90 capsule 0  . potassium chloride  (K-DUR) 10 MEQ tablet TAKE 2 TABLETS DAILY 180 tablet 1  . sertraline (ZOLOFT) 100 MG tablet Take 1 tablet (100 mg total) by mouth daily. 90 tablet 1  . ARIPiprazole (ABILIFY) 5 MG tablet Take 1 tablet (5 mg total) by mouth daily. 90 tablet 0   No current facility-administered medications for this visit.     Medication Side Effects: ED with Zoloft.  Viagra failed.  Using shots.  Allergies: No Known Allergies  Past Medical History:  Diagnosis Date  . Anemia 11/16/2014  . Bladder disease    "an unusual bladder disease; don't know what it's called" (06/30/2015)  . Bulging lumbar disc   . GERD (gastroesophageal reflux disease)   . History of hiatal hernia   . Hyperglycemia 11/16/2014  . Hyperlipemia   . Hypertension   . Joint pain, knee   . Migraine    06/30/2015 "maybe 2-3 times/year; not as bad as I used to have them"  . OSA (obstructive sleep apnea)     Family History  Problem Relation Age of Onset  . Stroke Mother 30       Died of CVA  . Hypertension Mother   . Anesthesia problems Neg Hx   . Hypotension Neg Hx   . Malignant hyperthermia Neg Hx   . Pseudochol deficiency Neg Hx     Social History   Socioeconomic History  . Marital status: Married    Spouse name: Not on file  . Number of children: 3  . Years of education: Not on file  . Highest education level: Not on file  Occupational History  . Not on file  Social Needs  . Financial resource strain: Not on file  . Food insecurity    Worry: Not on file    Inability: Not on file  . Transportation needs    Medical: Not on file    Non-medical: Not on file  Tobacco Use  . Smoking status: Former Smoker    Packs/day: 1.50    Years: 6.00    Pack years: 9.00    Types: Cigarettes  . Smokeless tobacco: Never Used  . Tobacco comment: "stopped smoking in ~ 1990"  Substance and Sexual Activity  . Alcohol use: No    Alcohol/week: 0.0 standard drinks  . Drug use: No  . Sexual activity: Yes  Lifestyle  . Physical  activity    Days per week: Not on file    Minutes per session: Not on file  . Stress: Not on file  Relationships  . Social Herbalist on phone: Not on file    Gets together: Not on file    Attends religious service: Not on file    Active member of club or organization: Not on file    Attends meetings of clubs or organizations: Not on file    Relationship status: Not on file  . Intimate partner violence    Fear of current or ex partner: Not on  file    Emotionally abused: Not on file    Physically abused: Not on file    Forced sexual activity: Not on file  Other Topics Concern  . 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   Please see review of systems for further details on the patient's review from today.   Objective:   Physical Exam:  There were no vitals taken for this visit.  Physical Exam Constitutional:      General: He is not in acute distress.    Appearance: He is well-developed.  Musculoskeletal:        General: No deformity.  Neurological:     Mental Status: He is alert and oriented to person, place, and time.     Motor: No tremor.     Coordination: Coordination normal.     Gait: Gait normal.  Psychiatric:        Attention and Perception: Attention and perception normal.        Mood and Affect: Mood is not anxious or depressed. Affect is not labile, blunt, angry or inappropriate.        Speech: Speech normal.        Behavior: Behavior normal.        Thought Content: Thought content normal. Thought content does not include homicidal or suicidal ideation. Thought content does not include homicidal or suicidal plan.        Cognition and Memory: Cognition normal.        Judgment: Judgment normal.     Comments: Insight  intact. No auditory or visual hallucinations. No delusions.      Lab Review:     Component Value Date/Time   NA 143 01/13/2018 1542   K 3.6 01/13/2018 1542   CL 104 01/13/2018 1542   CO2 31 01/13/2018 1542   GLUCOSE 89 01/13/2018 1542   BUN 22 01/13/2018 1542   CREATININE 1.05 01/13/2018 1542   CALCIUM 9.3 01/13/2018 1542   PROT 6.2 (L) 10/22/2017 0316   ALBUMIN 3.4 (L) 10/22/2017 0316   AST 36 10/22/2017 0316   ALT 76 (H) 10/22/2017 0316   ALKPHOS 73 10/22/2017 0316   BILITOT 0.8 10/22/2017 0316   GFRNONAA >60 10/25/2017 0438   GFRAA >60 10/25/2017 0438       Component Value Date/Time   WBC 12.1 (H) 10/25/2017 0438   RBC 4.26 10/25/2017 0438   HGB 12.8 (L) 10/25/2017 0438   HCT 40.5 10/25/2017 0438   PLT 312 10/25/2017 0438   MCV 95.1 10/25/2017 0438   MCH 30.0 10/25/2017 0438   MCHC 31.6 10/25/2017 0438   RDW 14.1 10/25/2017 0438   LYMPHSABS 2.9 10/25/2017 0438   MONOABS 1.0 10/25/2017 0438   EOSABS 0.1 10/25/2017 0438   BASOSABS 0.0 10/25/2017 0438    No results found for: POCLITH, LITHIUM   No results found for: PHENYTOIN, PHENOBARB, VALPROATE, CBMZ   .res Assessment: Plan:    Depression, major, recurrent, in complete remission ([place]) - Plan: ARIPiprazole (ABILIFY) 5 MG tablet  Panic disorder with agoraphobia  Generalized anxiety disorder  Jeremy [name] has a history of severe depression and panic disorder and anxiety that caused him to go out on disability in 2019.  He has been back to  work and then had to come out of disability again due to back pain.  Since the last visit he has completely retired.    He also has a history of short-term disability in 2006-11-27 after the death of his mother and brother.  He has had good response to the combination of Zoloft and Abilify.  His panic attacks have stopped and his depression is under control.    Since retiring his stress level has reduced significantly.  His depression and anxiety are under control.  We will consider  reduction in Abilify to simplify medication and reduce overall side effect risk.  He is aware of the risk of worsening symptoms and will contact us should that occur.  He has a 90-day supply of Abilify 7.5 mg left and wants to use that up.  I have sent in a refill for Abilify 5 mg 1 daily after he completes his current 7.5 mg daily supply. No other med changes  Discussed potential metabolic side effects associated with atypical antipsychotics, as well as potential risk for movement side effects. Advised pt to contact office if movement side effects occur.   Follow-up 6 months  Lynder Parents, MD, DFAPA  Please see After Visit Summary for patient specific instructions.  Future Appointments  Date Time Provider [place]  12/04/2019 10:45 AM Cottle, Billey Co., MD CP-CP None    No orders of the defined types were placed in this encounter.     -------------------------------

## 2019-06-05 NOTE — Patient Instructions (Signed)
Once out of current supply of aripiprazole then new dosage will be lower at 1 of the 5 mg tablets.

## 2019-06-06 ENCOUNTER — Other Ambulatory Visit: Payer: Self-pay | Admitting: Family

## 2019-06-06 MED ORDER — FUROSEMIDE 20 MG PO TABS: 20.0000 mg | ORAL_TABLET | Freq: Every day | ORAL | 1 refills | Status: DC

## 2019-06-06 NOTE — Telephone Encounter (Signed)
Medication Refill - Medication:  furosemide (LASIX) 20 MG tablet  Has the patient contacted their pharmacy? Yes advised to call office.   Preferred Pharmacy (with phone number or street name):  [facility] [id number] [facility], [place] [phone] (Phone) [phone] (Fax)     Agent: Please be advised that RX refills may take up to 3 business days. We ask that you follow-up with your pharmacy.

## 2019-06-06 NOTE — Telephone Encounter (Signed)
Requested medication (s) are due for refill today: yes  Requested medication (s) are on the active medication list: yes  Last refill:  10/28/2018  Future visit scheduled: no  Notes to clinic:  Review for refill Overdue for office visit   Requested Prescriptions  Pending Prescriptions Disp Refills   furosemide (LASIX) 20 MG tablet 90 tablet 1    Sig: Take 1 tablet (20 mg total) by mouth daily.     Cardiovascular:  Diuretics - Loop Failed - 06/06/2019  1:35 PM      Failed - K in normal range and within 360 days    Potassium  Date Value Ref Range Status  01/13/2018 3.6 3.5 - 5.3 mmol/L Final         Failed - Ca in normal range and within 360 days    Calcium  Date Value Ref Range Status  01/13/2018 9.3 8.6 - 10.3 mg/dL Final         Failed - Na in normal range and within 360 days    Sodium  Date Value Ref Range Status  01/13/2018 143 135 - 146 mmol/L Final         Failed - Cr in normal range and within 360 days    Creat  Date Value Ref Range Status  01/13/2018 1.05 0.70 - 1.25 mg/dL Final    Comment:    For patients >50 years of age, the reference limit for Creatinine is approximately 13% higher for people identified as African-American. .          Failed - Valid encounter within last 6 months    Recent Outpatient Visits          6 months ago Hyperglycemia   [facility], NP   7 months ago Moderate asthma with acute exacerbation, unspecified whether persistent   [facility], NP   9 months ago Low back pain, unspecified back pain laterality, unspecified chronicity, unspecified whether sciatica present   [facility], NP   10 months ago Chronic low back pain, unspecified back pain laterality, unspecified whether sciatica present   [facility], NP   1 year ago Chronic left-sided low back pain without sciatica   [facility], NP             Passed - Last BP in normal range    BP Readings from Last 1 Encounters:  01/08/19 116/68

## 2019-06-20 ENCOUNTER — Other Ambulatory Visit: Payer: Self-pay | Admitting: Family

## 2019-06-20 MED ORDER — METOPROLOL SUCCINATE ER 50 MG PO TB24: ORAL_TABLET | ORAL | 0 refills | Status: DC

## 2019-06-20 NOTE — Telephone Encounter (Signed)
Medication Refill - Medication: metoprolol succinate (TOPROL-XL) 50 MG 24 hr tablet  Preferred Pharmacy (with phone number or street name):  [facility] [id number] - [address], [place] [phone] (Phone) [phone] (Fax)     Agent: Please be advised that RX refills may take up to 3 business days. We ask that you follow-up with your pharmacy.

## 2019-06-20 NOTE — Telephone Encounter (Signed)
Requested medication (s) are due for refill today: yes  Requested medication (s) are on the active medication list: yes  Last refill:  02/2019  Future visit scheduled: no  Notes to clinic:  Review for refill Patient overdue for office visit   Requested Prescriptions  Pending Prescriptions Disp Refills   metoprolol succinate (TOPROL-XL) 50 MG 24 hr tablet 90 tablet 1    Sig: TAKE 1 TABLET DAILY WITH OR IMMEDIATELY FOLLOWING A MEAL.     Cardiovascular:  Beta Blockers Failed - 06/20/2019  9:28 AM      Failed - Valid encounter within last 6 months    Recent Outpatient Visits          6 months ago Hyperglycemia   [facility], NP   7 months ago Moderate asthma with acute exacerbation, unspecified whether persistent   [facility], NP   10 months ago Low back pain, unspecified back pain laterality, unspecified chronicity, unspecified whether sciatica present   [facility], NP   11 months ago Chronic low back pain, unspecified back pain laterality, unspecified whether sciatica present   [facility], NP   1 year ago Chronic left-sided low back pain without sciatica   [facility], NP             Passed - Last BP in normal range    BP Readings from Last 1 Encounters:  01/08/19 116/68         Passed - Last Heart Rate in normal range    Pulse Readings from Last 1 Encounters:  01/08/19 62

## 2019-06-20 NOTE — Telephone Encounter (Signed)
Received Metoprolol request from [facility]. Last Rx was sent to [facility] on 02/26/19, # 90 x 1 refill. Is pt requesting 2 week supply from local pharmacy while waiting on mail order? Sent mychart message to pt requesting reason for refill to [facility] and to let him know he is past due for follow up. Current Rx denied.

## 2019-06-25 ENCOUNTER — Other Ambulatory Visit: Payer: Self-pay

## 2019-06-26 ENCOUNTER — Ambulatory Visit: Payer: BC Managed Care – PPO | Admitting: Family

## 2019-06-26 VITALS — BP 123/66 | HR 61 | Temp 97.3°F | Resp 16 | Wt 243.0 lb

## 2019-06-26 DIAGNOSIS — D509 Iron deficiency anemia, unspecified: Secondary | ICD-10-CM | POA: Diagnosis not present

## 2019-06-26 DIAGNOSIS — E785 Hyperlipidemia, unspecified: Secondary | ICD-10-CM | POA: Diagnosis not present

## 2019-06-26 DIAGNOSIS — K219 Gastro-esophageal reflux disease without esophagitis: Secondary | ICD-10-CM | POA: Diagnosis not present

## 2019-06-26 DIAGNOSIS — R739 Hyperglycemia, unspecified: Secondary | ICD-10-CM

## 2019-06-26 DIAGNOSIS — I10 Essential (primary) hypertension: Secondary | ICD-10-CM

## 2019-06-26 DIAGNOSIS — G4733 Obstructive sleep apnea (adult) (pediatric): Secondary | ICD-10-CM

## 2019-06-26 MED ORDER — METOPROLOL SUCCINATE ER 50 MG PO TB24: ORAL_TABLET | ORAL | 1 refills | Status: DC

## 2019-06-26 MED ORDER — HYDROCHLOROTHIAZIDE 25 MG PO TABS: 25.0000 mg | ORAL_TABLET | Freq: Every day | ORAL | 1 refills | Status: DC

## 2019-06-26 NOTE — Patient Instructions (Signed)
Please complete lab work prior to leaving.   

## 2019-06-26 NOTE — Progress Notes (Signed)
Subjective:    [name], male    DOB: 08-15-1956, 63 y.o.   MRN: [id number]  HPI  Patient is a 63 yr old male who presents today for follow up.  Hyperglycemia-  Lab Results  Component Value Date   HGBA1C 5.8 08/22/2017   Chronic back pain-  Reports that his back still bothers him but not "like it did."  He has retired.   HTN-  Current meds include lotrel, hctz, toprol xl.  BP Readings from Last 3 Encounters:  06/26/19 123/66  01/08/19 116/68  11/06/18 136/70   Depression- continues to follow with psychiatry (Dr. Clovis Pu). Reports that he is mainatined on abilify and zoloft and symptoms are stable.   GERD-  Maintained on omeprazole.  Denies gerd symptoms.   Iron deficiency- he continues iron otc bid.    Reports that he has not had to use albuterol recently.    Review of Systems See HPI  Past Medical History:  Diagnosis Date  . Anemia 11/16/2014  . Bladder disease    "an unusual bladder disease; don't know what it's called" (06/30/2015)  . Bulging lumbar disc   . GERD (gastroesophageal reflux disease)   . History of hiatal hernia   . Hyperglycemia 11/16/2014  . Hyperlipemia   . Hypertension   . Joint pain, knee   . Migraine    06/30/2015 "maybe 2-3 times/year; not as bad as I used to have them"  . OSA (obstructive sleep apnea)      Social History   Socioeconomic History  . Marital status: Married    Spouse name: Not on file  . Number of children: 3  . Years of education: Not on file  . Highest education level: Not on file  Occupational History  . Not on file  Social Needs  . Financial resource strain: Not on file  . Food insecurity    Worry: Not on file    Inability: Not on file  . Transportation needs    Medical: Not on file    Non-medical: Not on file  Tobacco Use  . Smoking status: Former Smoker    Packs/day: 1.50    Years: 6.00    Pack years: 9.00    Types: Cigarettes  . Smokeless tobacco: Never Used  . Tobacco comment:  "stopped smoking in ~ 1990"  Substance and Sexual Activity  . Alcohol use: No    Alcohol/week: 0.0 standard drinks  . Drug use: No  . Sexual activity: Yes  Lifestyle  . Physical activity    Days per week: Not on file    Minutes per session: Not on file  . Stress: Not on file  Relationships  . Social Herbalist on phone: Not on file    Gets together: Not on file    Attends religious service: Not on file    Active member of club or organization: Not on file    Attends meetings of clubs or organizations: Not on file    Relationship status: Not on file  . Intimate partner violence    Fear of current or ex partner: Not on file    Emotionally abused: Not on file    Physically abused: Not on file    Forced sexual activity: Not on file  Other Topics Concern  . 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 ESOPHAGOGASTRODUODENOSCOPY    . ESOPHAGOGASTRODUODENOSCOPY (EGD) WITH ESOPHAGEAL DILATION  ~ 2014  . KNEE ARTHROSCOPY Right 1995  . NASAL SEPTUM SURGERY  ~ 1972  . TOTAL KNEE ARTHROPLASTY  10/11/2011   Procedure: TOTAL KNEE ARTHROPLASTY;  Surgeon: Rudean Haskell, MD;  Location: [facility];  Service: Orthopedics;  Laterality: Right;  . 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 Stroke Mother 29       Died of CVA  . Hypertension Mother   . Anesthesia problems Neg Hx   . Hypotension Neg Hx   . Malignant hyperthermia Neg Hx   . Pseudochol deficiency Neg Hx     No Known Allergies  Current Outpatient Medications on File Prior to Visit  Medication Sig Dispense Refill  . albuterol (PROVENTIL HFA;VENTOLIN HFA) 108 (90 Base) MCG/ACT inhaler Inhale 2 puffs into the lungs every 6 (six) hours as needed for wheezing or shortness  of breath. 1 Inhaler 2  . albuterol (PROVENTIL) (2.5 MG/3ML) 0.083% nebulizer solution Take 3 mLs (2.5 mg total) by nebulization every 6 (six) hours as needed for wheezing or shortness of breath. 150 mL 1  . ALPRAZolam (XANAX) 0.5 MG tablet Take 0.5 tablets (0.25 mg total) by mouth 2 (two) times daily as needed for anxiety. 30 tablet 0  . amLODipine-benazepril (LOTREL) 10-40 MG capsule TAKE 1 CAPSULE DAILY 90 capsule 4  . ARIPiprazole (ABILIFY) 5 MG tablet Take 1 tablet (5 mg total) by mouth daily. 90 tablet 0  . b complex vitamins tablet Take 1 tablet by mouth daily.    . cyclobenzaprine (FLEXERIL) 10 MG tablet Take 1 tablet by mouth as directed.  0  . ferrous sulfate 325 (65 FE) MG tablet Take 1 tablet (325 mg total) by mouth 2 (two) times daily with a meal. 60 tablet 0  . fexofenadine (ALLEGRA) 180 MG tablet Take 1 tablet (180 mg total) by mouth daily. 30 tablet 0  . furosemide (LASIX) 20 MG tablet Take 1 tablet (20 mg total) by mouth daily. 90 tablet 1  . hydrochlorothiazide (HYDRODIURIL) 25 MG tablet TAKE 1 TABLET DAILY 90 tablet 4  . meloxicam (MOBIC) 7.5 MG tablet Take 1 tablet (7.5 mg total) by mouth daily. 30 tablet 0  . metoprolol succinate (TOPROL-XL) 50 MG 24 hr tablet TAKE 1 TABLET DAILY WITH OR IMMEDIATELY FOLLOWING A MEAL. 30 tablet 0  . Multiple Vitamin (MULTIVITAMIN WITH MINERALS) TABS tablet Take 1 tablet by mouth daily.    . Nebulizers (VIOS AEROSOL DELIVERY SYSTEM) MISC 1 each as directed.  0  . omeprazole (PRILOSEC) 40 MG capsule Take 1 capsule (40 mg total) by mouth daily. 90 capsule 0  . potassium chloride (K-DUR) 10 MEQ tablet TAKE 2 TABLETS DAILY 180 tablet 1  . sertraline (ZOLOFT) 100 MG tablet Take 1 tablet (100 mg total) by mouth daily. 90 tablet 1   No current facility-administered medications on file prior to visit.     BP 123/66 (BP Location: Right Arm, Patient Position: Sitting, Cuff Size: Large)   Pulse 61   Temp (!) 97.3 F (36.3 C) (Temporal)   Resp 16    Wt 243 lb (110.2 kg)   SpO2 97%   BMI 38.06 kg/m       Objective:   Physical Exam Constitutional:  General: He is not in acute distress.    Appearance: He is well-developed.  HENT:     Head: Normocephalic and atraumatic.  Cardiovascular:     Rate and Rhythm: Normal rate and regular rhythm.     Heart sounds: No murmur.  Pulmonary:     Effort: Pulmonary effort is normal. No respiratory distress.     Breath sounds: Normal breath sounds. No wheezing or rales.  Skin:    General: Skin is warm and dry.  Neurological:     Mental Status: He is alert and oriented to person, place, and time.  Psychiatric:        Behavior: Behavior normal.        Thought Content: Thought content normal.           Assessment & Plan:  HTN- bp stable on current medication, continue same.  Depression- stable, management per psychiatry.  Hyperglycemia- obtain follow up A1C.  GERD- stable on PPI. Continue same.  Iron deficiency anemia- check cbc, iron, ferritin.   OSA- good compliance with CPAP.  Managed by Dr. Elsworth Soho.

## 2019-06-27 LAB — COMPREHENSIVE METABOLIC PANEL
ALT: 27 U/L (ref 0–53)
AST: 18 U/L (ref 0–37)
Albumin: 4.3 g/dL (ref 3.5–5.2)
Alkaline Phosphatase: 90 U/L (ref 39–117)
BUN: 13 mg/dL (ref 6–23)
CO2: 32 mEq/L (ref 19–32)
Calcium: 9.4 mg/dL (ref 8.4–10.5)
Chloride: 102 mEq/L (ref 96–112)
Creatinine, Ser: 0.87 mg/dL (ref 0.40–1.50)
GFR: 88.18 mL/min (ref >60.00)
Glucose, Bld: 105 mg/dL — ABNORMAL HIGH (ref 70–99)
Potassium: 3.6 mEq/L (ref 3.5–5.1)
Sodium: 142 mEq/L (ref 135–145)
Total Bilirubin: 0.4 mg/dL (ref 0.2–1.2)
Total Protein: 6.5 g/dL (ref 6.0–8.3)

## 2019-06-27 LAB — LDL CHOLESTEROL, DIRECT: Direct LDL: 105 mg/dL

## 2019-06-27 LAB — FERRITIN: Ferritin: 126.3 ng/mL (ref 22.0–322.0)

## 2019-06-27 LAB — IRON: Iron: 75 ug/dL (ref 42–165)

## 2019-06-27 LAB — CBC WITH DIFFERENTIAL/PLATELET
Basophils Absolute: 0.1 10*3/uL (ref 0.0–0.1)
Basophils Relative: 0.9 % (ref 0.0–3.0)
Eosinophils Absolute: 0.2 10*3/uL (ref 0.0–0.7)
Eosinophils Relative: 2.5 % (ref 0.0–5.0)
HCT: 41.8 % (ref 39.0–52.0)
Hemoglobin: 14.3 g/dL (ref 13.0–17.0)
Lymphocytes Relative: 32.8 % (ref 12.0–46.0)
Lymphs Abs: 2.3 10*3/uL (ref 0.7–4.0)
MCHC: 34.3 g/dL (ref 30.0–36.0)
MCV: 90.8 fl (ref 78.0–100.0)
Monocytes Absolute: 0.6 10*3/uL (ref 0.1–1.0)
Monocytes Relative: 9.2 % (ref 3.0–12.0)
Neutro Abs: 3.8 10*3/uL (ref 1.4–7.7)
Neutrophils Relative %: 54.6 % (ref 43.0–77.0)
Platelets: 258 10*3/uL (ref 150.0–400.0)
RBC: 4.6 Mil/uL (ref 4.22–5.81)
RDW: 14 % (ref 11.5–15.5)
WBC: 6.9 10*3/uL (ref 4.0–10.5)

## 2019-06-27 LAB — LIPID PANEL
Cholesterol: 175 mg/dL (ref 0–200)
HDL: 31.6 mg/dL — ABNORMAL LOW (ref >39.00)
NonHDL: 143.04
Total CHOL/HDL Ratio: 6
Triglycerides: 331 mg/dL — ABNORMAL HIGH (ref 0.0–149.0)
VLDL: 66.2 mg/dL — ABNORMAL HIGH (ref 0.0–40.0)

## 2019-06-27 LAB — HEMOGLOBIN A1C: Hgb A1c MFr Bld: 5.4 % (ref 4.6–6.5)

## 2019-06-29 ENCOUNTER — Other Ambulatory Visit: Payer: Self-pay | Admitting: Psychiatry

## 2019-06-30 ENCOUNTER — Other Ambulatory Visit: Payer: Self-pay | Admitting: Psychiatry

## 2019-07-11 ENCOUNTER — Other Ambulatory Visit: Payer: Self-pay | Admitting: Family

## 2019-07-11 MED ORDER — METOPROLOL SUCCINATE ER 50 MG PO TB24: ORAL_TABLET | ORAL | 1 refills | Status: DC

## 2019-07-11 MED ORDER — AMLODIPINE BESY-BENAZEPRIL HCL 10-40 MG PO CAPS: 1.0000 | ORAL_CAPSULE | Freq: Every day | ORAL | 4 refills | Status: DC

## 2019-07-11 NOTE — Telephone Encounter (Signed)
Copied from [place] [phone] [id number]. Topic: Quick Communication - Rx Refill/Question >> Jul 11, 2019  9:37 AM Yvette Rack wrote: Medication: metoprolol succinate (TOPROL-XL) 50 MG 24 hr tablet and amLODipine-benazepril (LOTREL) 10-40 MG capsule  Has the patient contacted their pharmacy? no  Preferred Pharmacy (with phone number or street name): [facility] [id number] - [address], [place] [phone] (Phone)  [phone] (Fax)  Agent: Please be advised that RX refills may take up to 3 business days. We ask that you follow-up with your pharmacy.

## 2019-07-30 ENCOUNTER — Other Ambulatory Visit: Payer: Self-pay | Admitting: Family

## 2019-07-30 MED ORDER — POTASSIUM CHLORIDE CRYS ER 10 MEQ PO TBCR: 20.0000 meq | EXTENDED_RELEASE_TABLET | Freq: Every day | ORAL | 0 refills | Status: DC

## 2019-07-30 NOTE — Telephone Encounter (Signed)
Copied from [place] [phone]. Topic: Quick Communication - Rx Refill/Question >> Jul 30, 2019  8:58 AM Leward Quan A wrote: Medication: potassium chloride (K-DUR) 10 MEQ tablet   Has the patient contacted their pharmacy? Yes.   (Agent: If no, request that the patient contact the pharmacy for the refill.) (Agent: If yes, when and what did the pharmacy advise?)  Preferred Pharmacy (with phone number or street name): [facility] STORE [id number] [facility], [place] [phone] (Phone) [phone] (Fax)    Agent: Please be advised that RX refills may take up to 3 business days. We ask that you follow-up with your pharmacy.

## 2019-08-20 DIAGNOSIS — H5213 Myopia, bilateral: Secondary | ICD-10-CM | POA: Diagnosis not present

## 2019-08-20 DIAGNOSIS — H3562 Retinal hemorrhage, left eye: Secondary | ICD-10-CM | POA: Diagnosis not present

## 2019-08-20 DIAGNOSIS — H3561 Retinal hemorrhage, right eye: Secondary | ICD-10-CM | POA: Diagnosis not present

## 2019-08-20 DIAGNOSIS — H35033 Hypertensive retinopathy, bilateral: Secondary | ICD-10-CM | POA: Diagnosis not present

## 2019-09-01 ENCOUNTER — Other Ambulatory Visit: Payer: Self-pay | Admitting: Psychiatry

## 2019-09-01 DIAGNOSIS — F3342 Major depressive disorder, recurrent, in full remission: Secondary | ICD-10-CM

## 2019-09-03 ENCOUNTER — Encounter (INDEPENDENT_AMBULATORY_CARE_PROVIDER_SITE_OTHER): Payer: Self-pay | Admitting: Ophthalmology

## 2019-09-06 ENCOUNTER — Encounter (INDEPENDENT_AMBULATORY_CARE_PROVIDER_SITE_OTHER): Payer: BC Managed Care – PPO | Admitting: Ophthalmology

## 2019-09-06 DIAGNOSIS — I10 Essential (primary) hypertension: Secondary | ICD-10-CM | POA: Diagnosis not present

## 2019-09-06 DIAGNOSIS — H35033 Hypertensive retinopathy, bilateral: Secondary | ICD-10-CM

## 2019-09-06 DIAGNOSIS — H43813 Vitreous degeneration, bilateral: Secondary | ICD-10-CM

## 2019-09-06 DIAGNOSIS — D3132 Benign neoplasm of left choroid: Secondary | ICD-10-CM

## 2019-09-06 DIAGNOSIS — H3562 Retinal hemorrhage, left eye: Secondary | ICD-10-CM | POA: Diagnosis not present

## 2019-09-06 DIAGNOSIS — H318 Other specified disorders of choroid: Secondary | ICD-10-CM

## 2019-09-06 DIAGNOSIS — H2513 Age-related nuclear cataract, bilateral: Secondary | ICD-10-CM

## 2019-09-28 ENCOUNTER — Ambulatory Visit: Payer: BC Managed Care – PPO | Admitting: Family

## 2019-09-28 ENCOUNTER — Encounter: Payer: Self-pay | Admitting: Family

## 2019-09-28 ENCOUNTER — Other Ambulatory Visit: Payer: Self-pay

## 2019-09-28 VITALS — BP 127/68 | HR 65 | Temp 97.2°F | Resp 16 | Ht 67.0 in | Wt 253.0 lb

## 2019-09-28 DIAGNOSIS — I10 Essential (primary) hypertension: Secondary | ICD-10-CM

## 2019-09-28 DIAGNOSIS — E781 Pure hyperglyceridemia: Secondary | ICD-10-CM | POA: Diagnosis not present

## 2019-09-28 DIAGNOSIS — J4520 Mild intermittent asthma, uncomplicated: Secondary | ICD-10-CM | POA: Diagnosis not present

## 2019-09-28 DIAGNOSIS — R739 Hyperglycemia, unspecified: Secondary | ICD-10-CM

## 2019-09-28 NOTE — Progress Notes (Signed)
Subjective:    [name], male    DOB: 1956-06-09, 64 y.o.   MRN: [id number]  HPI   Patient is a 64 yr old male who presents today for follow up.  1) Iron deficiency anemia- continues iron supplement.  Lab Results  Component Value Date   WBC 6.9 06/26/2019   HGB 14.3 06/26/2019   HCT 41.8 06/26/2019   MCV 90.8 06/26/2019   PLT 258.0 06/26/2019   2) Hyperglycemia-  Lab Results  Component Value Date   HGBA1C 5.4 06/26/2019   3) Asthma-no recent breathing issues.    5) HTN- maintained on lotrel, hctz, furosemide, toprol xl. BP Readings from Last 3 Encounters:  09/28/19 127/68  06/26/19 123/66  01/08/19 116/68   6) Depression/Anxiety- following with psychiatry (Dr. Clovis Pu). Maintained on abilify and zoloft.  Has a follow up appointment scheduled this spring.   Review of Systems See HPI  Past Medical History:  Diagnosis Date  . Anemia 11/16/2014  . Bladder disease    "an unusual bladder disease; don't know what it's called" (06/30/2015)  . Bulging lumbar disc   . GERD (gastroesophageal reflux disease)   . History of hiatal hernia   . Hyperglycemia 11/16/2014  . Hyperlipemia   . Hypertension   . Joint pain, knee   . Migraine    06/30/2015 "maybe 2-3 times/year; not as bad as I used to have them"  . OSA (obstructive sleep apnea)      Social History   Socioeconomic History  . Marital status: Married    Spouse name: Not on file  . Number of children: 3  . Years of education: Not on file  . Highest education level: Not on file  Occupational History  . Not on file  Tobacco Use  . Smoking status: Former Smoker    Packs/day: 1.50    Years: 6.00    Pack years: 9.00    Types: Cigarettes  . Smokeless tobacco: Never Used  . Tobacco comment: "stopped smoking in ~ 1990"  Substance and Sexual Activity  . Alcohol use: No    Alcohol/week: 0.0 standard drinks  . Drug use: No  . Sexual activity: Yes  Other Topics Concern  . 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 Difficulty of Paying Living Expenses: Not on file  Food Insecurity:   . Worried About Charity fundraiser in the Last Year: Not on file  . Ran Out of Food in the Last Year: Not on file  Transportation Needs:   . Lack of Transportation (Medical): Not on file  . Lack of Transportation (Non-Medical): Not on file  Physical Activity:   . Days of Exercise per Week: Not on file  . Minutes of Exercise per Session: Not on file  Stress:   . Feeling of Stress : Not on file  Social Connections:   . Frequency of Communication with Friends and Family: Not on file  . Frequency of Social Gatherings with Friends and Family: Not on file  . Attends Religious Services: Not on file  . Active Member of Clubs or Organizations: Not on file  . Attends Archivist Meetings: Not on file  . Marital Status: Not on file  Intimate Partner Violence:   .  Fear of Current or Ex-Partner: Not on file  . Emotionally Abused: Not on file  . Physically Abused: Not on file  . Sexually Abused: Not on file    Past Surgical History:  Procedure Laterality Date  . ESOPHAGOGASTRODUODENOSCOPY    . ESOPHAGOGASTRODUODENOSCOPY (EGD) WITH ESOPHAGEAL DILATION  ~ 2014  . KNEE ARTHROSCOPY Right 1995  . NASAL SEPTUM SURGERY  ~ 1972  . TOTAL KNEE ARTHROPLASTY  10/11/2011   Procedure: TOTAL KNEE ARTHROPLASTY;  Surgeon: Rudean Haskell, MD;  Location: [facility];  Service: Orthopedics;  Laterality: Right;  . 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 Stroke Mother 51       Died of CVA  . Hypertension Mother   . Anesthesia problems Neg Hx   . Hypotension Neg Hx   . Malignant hyperthermia Neg Hx   . Pseudochol deficiency Neg Hx     No  Known Allergies  Current Outpatient Medications on File Prior to Visit  Medication Sig Dispense Refill  . albuterol (PROVENTIL HFA;VENTOLIN HFA) 108 (90 Base) MCG/ACT inhaler Inhale 2 puffs into the lungs every 6 (six) hours as needed for wheezing or shortness of breath. 1 Inhaler 2  . albuterol (PROVENTIL) (2.5 MG/3ML) 0.083% nebulizer solution Take 3 mLs (2.5 mg total) by nebulization every 6 (six) hours as needed for wheezing or shortness of breath. 150 mL 1  . amLODipine-benazepril (LOTREL) 10-40 MG capsule Take 1 capsule by mouth daily. 90 capsule 4  . ARIPiprazole (ABILIFY) 5 MG tablet TAKE 1 TABLET(5 MG) BY MOUTH DAILY 90 tablet 0  . b complex vitamins tablet Take 1 tablet by mouth daily.    . cyclobenzaprine (FLEXERIL) 10 MG tablet Take 1 tablet by mouth as directed.  0  . ferrous sulfate 325 (65 FE) MG tablet Take 1 tablet (325 mg total) by mouth 2 (two) times daily with a meal. 60 tablet 0  . fexofenadine (ALLEGRA) 180 MG tablet Take 1 tablet (180 mg total) by mouth daily. 30 tablet 0  . furosemide (LASIX) 20 MG tablet Take 1 tablet (20 mg total) by mouth daily. 90 tablet 1  . hydrochlorothiazide (HYDRODIURIL) 25 MG tablet Take 1 tablet (25 mg total) by mouth daily. 90 tablet 1  . meloxicam (MOBIC) 7.5 MG tablet Take 1 tablet (7.5 mg total) by mouth daily. 30 tablet 0  . metoprolol succinate (TOPROL-XL) 50 MG 24 hr tablet TAKE 1 TABLET DAILY WITH OR IMMEDIATELY FOLLOWING A MEAL. 90 tablet 1  . Multiple Vitamin (MULTIVITAMIN WITH MINERALS) TABS tablet Take 1 tablet by mouth daily.    . Nebulizers (VIOS AEROSOL DELIVERY SYSTEM) MISC 1 each as directed.  0  . omeprazole (PRILOSEC) 40 MG capsule Take 1 capsule (40 mg total) by mouth daily. 90 capsule 0  . potassium chloride (KLOR-CON) 10 MEQ tablet Take 2 tablets (20 mEq total) by mouth daily. 180 tablet 0  . sertraline (ZOLOFT) 100 MG tablet TAKE 1 TABLET(100 MG) BY MOUTH DAILY 90 tablet 1   No current facility-administered medications  on file prior to visit.    BP 127/68 (BP Location: Right Arm, Patient Position: Sitting, Cuff Size: Large)   Pulse 65   Temp (!) 97.2 F (36.2 C) (Temporal)   Resp 16   Ht 5\' 7"  (1.702 m)   Wt 253 lb (114.8 kg)   SpO2 98%   BMI 39.63 kg/m  Objective:   Physical Exam Constitutional:      General: He is not in acute distress.    Appearance: He is well-developed.  HENT:     Head: Normocephalic and atraumatic.  Cardiovascular:     Rate and Rhythm: Normal rate and regular rhythm.     Heart sounds: No murmur.  Pulmonary:     Effort: Pulmonary effort is normal. No respiratory distress.     Breath sounds: Normal breath sounds. No wheezing or rales.  Skin:    General: Skin is warm and dry.  Neurological:     Mental Status: He is alert and oriented to person, place, and time.  Psychiatric:        Behavior: Behavior normal.        Thought Content: Thought content normal.           Assessment & Plan:  HTN- bp stable on current meds.  Hypertriglyceridemia- reviewed lab work that he brought from his work cpx. Follow  Up Trigs some better at 213 mg/dL.  Asthma- stable, has prn albuterol on hand.  Anxiety/depression- stable, being managed by psychiatry.  Iron def anemia- continues iron supplement.  H/H WNL. Iron was WNL back in 11/20.  Plan to repeat iron studies next visit.   This visit occurred during the SARS-CoV-2 public health emergency.  Safety protocols were in place, including screening questions prior to the visit, additional usage of staff PPE, and extensive cleaning of exam room while observing appropriate contact time as indicated for disinfecting solutions.

## 2019-10-04 ENCOUNTER — Encounter (INDEPENDENT_AMBULATORY_CARE_PROVIDER_SITE_OTHER): Payer: BC Managed Care – PPO | Admitting: Ophthalmology

## 2019-10-04 DIAGNOSIS — H3562 Retinal hemorrhage, left eye: Secondary | ICD-10-CM | POA: Diagnosis not present

## 2019-11-29 ENCOUNTER — Other Ambulatory Visit: Payer: Self-pay | Admitting: Family

## 2019-11-29 ENCOUNTER — Other Ambulatory Visit: Payer: Self-pay

## 2019-11-29 MED ORDER — FUROSEMIDE 20 MG PO TABS: 20.0000 mg | ORAL_TABLET | Freq: Every day | ORAL | 1 refills | Status: DC

## 2019-11-29 NOTE — Telephone Encounter (Signed)
Patient needs refill on furosemide (LASIX) 20 MG tablet . Please advise .

## 2019-11-30 NOTE — Telephone Encounter (Signed)
Pt was called and given information.  

## 2019-12-04 ENCOUNTER — Ambulatory Visit (INDEPENDENT_AMBULATORY_CARE_PROVIDER_SITE_OTHER): Payer: Medicare Other | Admitting: Psychiatry

## 2019-12-04 ENCOUNTER — Encounter: Payer: Self-pay | Admitting: Psychiatry

## 2019-12-04 ENCOUNTER — Other Ambulatory Visit: Payer: Self-pay

## 2019-12-04 DIAGNOSIS — F411 Generalized anxiety disorder: Secondary | ICD-10-CM

## 2019-12-04 DIAGNOSIS — F4001 Agoraphobia with panic disorder: Secondary | ICD-10-CM | POA: Diagnosis not present

## 2019-12-04 DIAGNOSIS — F3342 Major depressive disorder, recurrent, in full remission: Secondary | ICD-10-CM

## 2019-12-04 MED ORDER — ARIPIPRAZOLE 5 MG PO TABS: 5.0000 mg | ORAL_TABLET | Freq: Every day | ORAL | 0 refills | Status: DC

## 2019-12-04 NOTE — Progress Notes (Signed)
JADORE PAOLUCCI [id number] 06/08/1956 64 y.o.     Subjective:   Patient ID:  Jeremy [name] is a 64 y.o. (DOB 08-21-1956) male.  Chief Complaint:  Chief Complaint  Patient presents with  . Follow-up  . Depression  . Anxiety    Anxiety Patient reports no chest pain, confusion, decreased concentration, nervous/anxious behavior or suicidal ideas.    Depression        Associated symptoms include no decreased concentration and no suicidal ideas.  Past medical history includes anxiety.     Jud Fabregas Mapps presents  today for follow-up of anxiety and depression.   He had been out on disability due to his back since September at that time.  Last seen April 2020.  Since retirement he has been under less stress and had asked to reduce and hopefully eliminate some medications.  We reduced Abilify from 7.5 to 5 mg daily.  As of December 04, 2019 the following is noted: Still good. nOTHing worse with dose reduction.   Benefit from meds. Help depression, anxiety and anger.  They are all under control. No mood swings nor prolonged depression.  Anxiety OK.  Wants to try to reduce meds again if possible bc less stress as noted.  Patient reports stable mood and denies depressed or irritable moods.  Patient denies any recent difficulty with anxiety.  No panic. Patient denies difficulty with sleep initiation or maintenance. Denies appetite disturbance.  Patient reports that energy and motivation have been good.  Patient denies any difficulty with concentration.  Patient denies any suicidal ideation.  Has retired.  Has reduced his stress significantly.  Past history of psychiatric disability. Not sure how many episodes of depression he's had in life.  Historically about the same levels of depression and anxiety   Review of Systems:  Review of Systems  Cardiovascular: Negative for chest pain.  Musculoskeletal: Positive for arthralgias and back pain.  Psychiatric/Behavioral: Positive for  depression. Negative for agitation, behavioral problems, confusion, decreased concentration, dysphoric mood, hallucinations, self-injury, sleep disturbance and suicidal ideas. The patient is not nervous/anxious and is not hyperactive.     Medications: I have reviewed the patient's current medications.  Current Outpatient Medications  Medication Sig Dispense Refill  . albuterol (PROVENTIL HFA;VENTOLIN HFA) 108 (90 Base) MCG/ACT inhaler Inhale 2 puffs into the lungs every 6 (six) hours as needed for wheezing or shortness of breath. 1 Inhaler 2  . albuterol (PROVENTIL) (2.5 MG/3ML) 0.083% nebulizer solution Take 3 mLs (2.5 mg total) by nebulization every 6 (six) hours as needed for wheezing or shortness of breath. 150 mL 1  . amLODipine-benazepril (LOTREL) 10-40 MG capsule Take 1 capsule by mouth daily. 90 capsule 4  . ARIPiprazole (ABILIFY) 5 MG tablet TAKE 1 TABLET(5 MG) BY MOUTH DAILY 90 tablet 0  . b complex vitamins tablet Take 1 tablet by mouth daily.    . cyclobenzaprine (FLEXERIL) 10 MG tablet Take 1 tablet by mouth as directed.  0  . ferrous sulfate 325 (65 FE) MG tablet Take 1 tablet (325 mg total) by mouth 2 (two) times daily with a meal. 60 tablet 0  . fexofenadine (ALLEGRA) 180 MG tablet Take 1 tablet (180 mg total) by mouth daily. 30 tablet 0  . furosemide (LASIX) 20 MG tablet Take 1 tablet (20 mg total) by mouth daily. 90 tablet 1  . hydrochlorothiazide (HYDRODIURIL) 25 MG tablet Take 1 tablet (25 mg total) by mouth daily. 90 tablet 1  . meloxicam (MOBIC) 7.5 MG tablet  Take 1 tablet (7.5 mg total) by mouth daily. 30 tablet 0  . metoprolol succinate (TOPROL-XL) 50 MG 24 hr tablet TAKE 1 TABLET DAILY WITH OR IMMEDIATELY FOLLOWING A MEAL. 90 tablet 1  . Multiple Vitamin (MULTIVITAMIN WITH MINERALS) TABS tablet Take 1 tablet by mouth daily.    . Nebulizers (VIOS AEROSOL DELIVERY SYSTEM) MISC 1 each as directed.  0  . omeprazole (PRILOSEC) 40 MG capsule Take 1 capsule (40 mg total) by  mouth daily. 90 capsule 0  . potassium chloride (KLOR-CON) 10 MEQ tablet Take 2 tablets (20 mEq total) by mouth daily. 180 tablet 0  . sertraline (ZOLOFT) 100 MG tablet TAKE 1 TABLET(100 MG) BY MOUTH DAILY 90 tablet 1   No current facility-administered medications for this visit.    Medication Side Effects: ED with Zoloft.  Viagra failed.  Using shots.  Allergies: No Known Allergies  Past Medical History:  Diagnosis Date  . Anemia 11/16/2014  . Bladder disease    "an unusual bladder disease; don't know what it's called" (06/30/2015)  . Bulging lumbar disc   . GERD (gastroesophageal reflux disease)   . History of hiatal hernia   . Hyperglycemia 11/16/2014  . Hyperlipemia   . Hypertension   . Joint pain, knee   . Migraine    06/30/2015 "maybe 2-3 times/year; not as bad as I used to have them"  . OSA (obstructive sleep apnea)     Family History  Problem Relation Age of Onset  . Stroke Mother 27       Died of CVA  . Hypertension Mother   . Anesthesia problems Neg Hx   . Hypotension Neg Hx   . Malignant hyperthermia Neg Hx   . Pseudochol deficiency Neg Hx     Social History   Socioeconomic History  . Marital status: Married    Spouse name: Not on file  . Number of children: 3  . Years of education: Not on file  . Highest education level: Not on file  Occupational History  . Not on file  Tobacco Use  . Smoking status: Former Smoker    Packs/day: 1.50    Years: 6.00    Pack years: 9.00    Types: Cigarettes  . Smokeless tobacco: Never Used  . Tobacco comment: "stopped smoking in ~ 1990"  Substance and Sexual Activity  . Alcohol use: No    Alcohol/week: 0.0 standard drinks  . Drug use: No  . Sexual activity: Yes  Other Topics Concern  . 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  Difficulty of Paying Living Expenses:   Food Insecurity:   . Worried About Charity fundraiser in the Last Year:   . Arboriculturist in the Last Year:   Transportation Needs:   . Film/video editor (Medical):   Marland Kitchen Lack of Transportation (Non-Medical):   Physical Activity:   . Days of Exercise per Week:   . Minutes of Exercise per Session:   Stress:   . Feeling of Stress :   Social Connections:   . Frequency of Communication with Friends and Family:   . Frequency of Social Gatherings with Friends and Family:   . Attends Religious Services:   . Active Member of Clubs  or Organizations:   . Attends Archivist Meetings:   Marland Kitchen Marital Status:   Intimate Partner Violence:   . Fear of Current or Ex-Partner:   . Emotionally Abused:   Marland Kitchen Physically Abused:   . Sexually Abused:     Past Medical History, Surgical history, Social history, and Family history were reviewed and updated as appropriate.   Please see review of systems for further details on the patient's review from today.   Objective:   Physical Exam:  There were no vitals taken for this visit.  Physical Exam Constitutional:      General: He is not in acute distress.    Appearance: He is well-developed.  Musculoskeletal:        General: No deformity.  Neurological:     Mental Status: He is alert and oriented to person, place, and time.     Motor: No tremor.     Coordination: Coordination normal.     Gait: Gait normal.  Psychiatric:        Attention and Perception: Attention and perception normal.        Mood and Affect: Mood is not anxious or depressed. Affect is not labile, blunt, angry, tearful or inappropriate.        Speech: Speech normal.        Behavior: Behavior normal.        Thought Content: Thought content normal. Thought content does not include homicidal or suicidal ideation. Thought content does not include homicidal or suicidal plan.        Cognition and Memory: Cognition normal.         Judgment: Judgment normal.     Comments: Insight intact. No auditory or visual hallucinations. No delusions.      Lab Review:     Component Value Date/Time   NA 142 06/26/2019 1443   K 3.6 06/26/2019 1443   CL 102 06/26/2019 1443   CO2 32 06/26/2019 1443   GLUCOSE 105 (H) 06/26/2019 1443   BUN 13 06/26/2019 1443   CREATININE 0.87 06/26/2019 1443   CREATININE 1.05 01/13/2018 1542   CALCIUM 9.4 06/26/2019 1443   PROT 6.5 06/26/2019 1443   ALBUMIN 4.3 06/26/2019 1443   AST 18 06/26/2019 1443   ALT 27 06/26/2019 1443   ALKPHOS 90 06/26/2019 1443   BILITOT 0.4 06/26/2019 1443   GFRNONAA >60 10/25/2017 0438   GFRAA >60 10/25/2017 0438       Component Value Date/Time   WBC 6.9 06/26/2019 1443   RBC 4.60 06/26/2019 1443   HGB 14.3 06/26/2019 1443   HCT 41.8 06/26/2019 1443   PLT 258.0 06/26/2019 1443   MCV 90.8 06/26/2019 1443   MCH 30.0 10/25/2017 0438   MCHC 34.3 06/26/2019 1443   RDW 14.0 06/26/2019 1443   LYMPHSABS 2.3 06/26/2019 1443   MONOABS 0.6 06/26/2019 1443   EOSABS 0.2 06/26/2019 1443   BASOSABS 0.1 06/26/2019 1443    No results found for: POCLITH, LITHIUM   No results found for: PHENYTOIN, PHENOBARB, VALPROATE, CBMZ   .res Assessment: Plan:    Depression, major, recurrent, in complete remission ([place])  Panic disorder with agoraphobia  Generalized anxiety disorder Erectile 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 We discussed [name] has a history of severe depression and panic disorder and anxiety that caused him to go out on disability in 2019.  He has been back to work  and then had to come out of disability again due to back pain.  Since the last visit he has completely retired.    He also has a history of short-term disability in 12-13-06 after the death of his mother and brother.  He has had good response to the combination of Zoloft and Abilify.  His panic  attacks have stopped and his depression is under control.    Since retiring his stress level has reduced significantly.  His depression and anxiety are under control.  He wants to try to reduce medications again.  He did not have any problems with the reduction in Abilify from 7.5 to 5 mg daily.  We discussed the options of trying to eliminate gradually the Abilify versus reducing the sertraline which is probably more risky.  However he is having sexual side effects from sertraline and no side effects from Abilify.  He prefers to reduce the sertraline and see if he can get by with less of it. Therefore reduce sertraline to 50 mg daily.  Discussed the risk of worsening depression and anxiety and return of panic and if those occur call to let us know.  We also discussed the option of switching to an alternative SSRI in order to try to avoid the sexual side effects.   Disc SE in detail and SSRI withdrawal sx. Continue Abilify 5 mg daily until follow-up.  If this goes well and he does have a reduction in sexual side effects then we will proceed with eliminating the Abilify at the follow-up appointment.  Discussed potential metabolic side effects associated with atypical antipsychotics, as well as potential risk for movement side effects. Advised pt to contact office if movement side effects occur.   Follow-up 3 months  Lynder Parents, MD, DFAPA  Please see After Visit Summary for patient specific instructions.  Future Appointments  Date Time Provider [place]  12/26/2019 12:00 PM [name], NP [facility]  01/04/2020 12:45 PM Hayden Pedro, MD [facility] None    No orders of the defined types were placed in this encounter.     -------------------------------

## 2019-12-04 NOTE — Patient Instructions (Signed)
Reduce sertraline to one half of 100 mg daily or 50 mg daily

## 2019-12-21 ENCOUNTER — Other Ambulatory Visit: Payer: Self-pay

## 2019-12-21 ENCOUNTER — Ambulatory Visit (INDEPENDENT_AMBULATORY_CARE_PROVIDER_SITE_OTHER): Payer: Medicare Other | Admitting: Family

## 2019-12-21 ENCOUNTER — Encounter: Payer: Self-pay | Admitting: Family

## 2019-12-21 VITALS — BP 127/67 | HR 68 | Temp 97.4°F | Resp 16 | Wt 253.0 lb

## 2019-12-21 DIAGNOSIS — Z0000 Encounter for general adult medical examination without abnormal findings: Secondary | ICD-10-CM

## 2019-12-21 DIAGNOSIS — R739 Hyperglycemia, unspecified: Secondary | ICD-10-CM

## 2019-12-21 DIAGNOSIS — I10 Essential (primary) hypertension: Secondary | ICD-10-CM | POA: Diagnosis not present

## 2019-12-21 DIAGNOSIS — E781 Pure hyperglyceridemia: Secondary | ICD-10-CM | POA: Diagnosis not present

## 2019-12-21 DIAGNOSIS — D649 Anemia, unspecified: Secondary | ICD-10-CM | POA: Diagnosis not present

## 2019-12-21 DIAGNOSIS — D509 Iron deficiency anemia, unspecified: Secondary | ICD-10-CM | POA: Diagnosis not present

## 2019-12-21 DIAGNOSIS — M549 Dorsalgia, unspecified: Secondary | ICD-10-CM

## 2019-12-21 DIAGNOSIS — J45909 Unspecified asthma, uncomplicated: Secondary | ICD-10-CM

## 2019-12-21 DIAGNOSIS — G8929 Other chronic pain: Secondary | ICD-10-CM

## 2019-12-21 LAB — LIPID PANEL
Cholesterol: 183 mg/dL (ref 0–200)
HDL: 33.1 mg/dL — ABNORMAL LOW (ref >39.00)
NonHDL: 150.28
Total CHOL/HDL Ratio: 6
Triglycerides: 238 mg/dL — ABNORMAL HIGH (ref 0.0–149.0)
VLDL: 47.6 mg/dL — ABNORMAL HIGH (ref 0.0–40.0)

## 2019-12-21 LAB — CBC WITH DIFFERENTIAL/PLATELET
Basophils Absolute: 0.1 K/uL (ref 0.0–0.1)
Basophils Relative: 0.8 % (ref 0.0–3.0)
Eosinophils Absolute: 0.2 K/uL (ref 0.0–0.7)
Eosinophils Relative: 2.2 % (ref 0.0–5.0)
HCT: 42.3 % (ref 39.0–52.0)
Hemoglobin: 14.5 g/dL (ref 13.0–17.0)
Lymphocytes Relative: 30.2 % (ref 12.0–46.0)
Lymphs Abs: 2.3 K/uL (ref 0.7–4.0)
MCHC: 34.4 g/dL (ref 30.0–36.0)
MCV: 90.9 fl (ref 78.0–100.0)
Monocytes Absolute: 0.7 K/uL (ref 0.1–1.0)
Monocytes Relative: 8.7 % (ref 3.0–12.0)
Neutro Abs: 4.4 K/uL (ref 1.4–7.7)
Neutrophils Relative %: 58.1 % (ref 43.0–77.0)
Platelets: 268 K/uL (ref 150.0–400.0)
RBC: 4.65 Mil/uL (ref 4.22–5.81)
RDW: 14.1 % (ref 11.5–15.5)
WBC: 7.7 K/uL (ref 4.0–10.5)

## 2019-12-21 LAB — BASIC METABOLIC PANEL
BUN: 17 mg/dL (ref 6–23)
CO2: 34 mEq/L — ABNORMAL HIGH (ref 19–32)
Calcium: 9 mg/dL (ref 8.4–10.5)
Chloride: 101 mEq/L (ref 96–112)
Creatinine, Ser: 0.88 mg/dL (ref 0.40–1.50)
GFR: 86.9 mL/min (ref >60.00)
Glucose, Bld: 94 mg/dL (ref 70–99)
Potassium: 3.5 mEq/L (ref 3.5–5.1)
Sodium: 141 mEq/L (ref 135–145)

## 2019-12-21 LAB — FERRITIN: Ferritin: 177.8 ng/mL (ref 22.0–322.0)

## 2019-12-21 LAB — LDL CHOLESTEROL, DIRECT: Direct LDL: 113 mg/dL

## 2019-12-21 LAB — IRON: Iron: 80 ug/dL (ref 42–165)

## 2019-12-21 MED ORDER — POTASSIUM CHLORIDE CRYS ER 10 MEQ PO TBCR: 20.0000 meq | EXTENDED_RELEASE_TABLET | Freq: Every day | ORAL | 1 refills | Status: DC

## 2019-12-21 NOTE — Progress Notes (Signed)
Subjective:    [name], male    DOB: 04-28-1956, 64 y.o.   MRN: [id number]  HPI   Patient is a 64 yr old male who presents today for follow up.  Iron deficiency anemia- maintained on iron supplement.  Lab Results  Component Value Date   WBC 6.9 06/26/2019   HGB 14.3 06/26/2019   HCT 41.8 06/26/2019   MCV 90.8 06/26/2019   PLT 258.0 06/26/2019   Hyperglycemia-  Lab Results  Component Value Date   HGBA1C 5.4 06/26/2019   Hypertriglyceridemia-  Lab Results  Component Value Date   CHOL 175 06/26/2019   HDL 31.60 (L) 06/26/2019   LDLCALC 139 (H) 06/30/2015   LDLDIRECT 105.0 06/26/2019   TRIG 331.0 (H) 06/26/2019   CHOLHDL 6 06/26/2019   HTN- maintained on lotrel, hctz, toprol xl, lasix.  BP Readings from Last 3 Encounters:  12/21/19 127/67  09/28/19 127/68  06/26/19 123/66   Asthma- uses albuterol prn. Reports that his asthma has been "alright."  Not using albuterol "hardly ever."    Back pain- this is a chronic problem for him.  He has had a previous ESI for his pain.  Hurts when he has to bend over.  Non-radiating.  No pain right now with sitting.     Review of Systems See HPI  Past Medical History:  Diagnosis Date  . Anemia 11/16/2014  . Bladder disease    "an unusual bladder disease; don't know what it's called" (06/30/2015)  . Bulging lumbar disc   . GERD (gastroesophageal reflux disease)   . History of hiatal hernia   . Hyperglycemia 11/16/2014  . Hyperlipemia   . Hypertension   . Joint pain, knee   . Migraine    06/30/2015 "maybe 2-3 times/year; not as bad as I used to have them"  . OSA (obstructive sleep apnea)      Social History   Socioeconomic History  . Marital status: Married    Spouse name: Not on file  . Number of children: 3  . Years of education: Not on file  . Highest education level: Not on file  Occupational History  . Not on file  Tobacco Use  . Smoking status: Former Smoker    Packs/day: 1.50    Years:  6.00    Pack years: 9.00    Types: Cigarettes  . Smokeless tobacco: Never Used  . Tobacco comment: "stopped smoking in ~ 1990"  Substance and Sexual Activity  . Alcohol use: No    Alcohol/week: 0.0 standard drinks  . Drug use: No  . Sexual activity: Yes  Other Topics Concern  . 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 Difficulty of Paying Living Expenses:   Food Insecurity:   . Worried About Charity fundraiser in the Last Year:   . Arboriculturist in the Last Year:   Transportation Needs:   . Film/video editor (Medical):   Marland Kitchen Lack of Transportation (Non-Medical):   Physical Activity:   . Days of Exercise per Week:   . Minutes of Exercise per Session:   Stress:   . Feeling of Stress :   Social Connections:   . Frequency of Communication with Friends and Family:   .  Frequency of Social Gatherings with Friends and Family:   . Attends Religious Services:   . Active Member of Clubs or Organizations:   . Attends Archivist Meetings:   Marland Kitchen Marital Status:   Intimate Partner Violence:   . Fear of Current or Ex-Partner:   . Emotionally Abused:   Marland Kitchen Physically Abused:   . Sexually Abused:     Past Surgical History:  Procedure Laterality Date  . ESOPHAGOGASTRODUODENOSCOPY    . ESOPHAGOGASTRODUODENOSCOPY (EGD) WITH ESOPHAGEAL DILATION  ~ 2014  . KNEE ARTHROSCOPY Right 1995  . NASAL SEPTUM SURGERY  ~ 1972  . TOTAL KNEE ARTHROPLASTY  10/11/2011   Procedure: TOTAL KNEE ARTHROPLASTY;  Surgeon: Rudean Haskell, MD;  Location: [facility];  Service: Orthopedics;  Laterality: Right;  . 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 Stroke Mother 73       Died of CVA  . Hypertension Mother   . Anesthesia problems  Neg Hx   . Hypotension Neg Hx   . Malignant hyperthermia Neg Hx   . Pseudochol deficiency Neg Hx     No Known Allergies  Current Outpatient Medications on File Prior to Visit  Medication Sig Dispense Refill  . albuterol (PROVENTIL HFA;VENTOLIN HFA) 108 (90 Base) MCG/ACT inhaler Inhale 2 puffs into the lungs every 6 (six) hours as needed for wheezing or shortness of breath. 1 Inhaler 2  . albuterol (PROVENTIL) (2.5 MG/3ML) 0.083% nebulizer solution Take 3 mLs (2.5 mg total) by nebulization every 6 (six) hours as needed for wheezing or shortness of breath. 150 mL 1  . amLODipine-benazepril (LOTREL) 10-40 MG capsule Take 1 capsule by mouth daily. 90 capsule 4  . ARIPiprazole (ABILIFY) 5 MG tablet Take 1 tablet (5 mg total) by mouth daily. 90 tablet 0  . b complex vitamins tablet Take 1 tablet by mouth daily.    . cyclobenzaprine (FLEXERIL) 10 MG tablet Take 1 tablet by mouth as directed.  0  . ferrous sulfate 325 (65 FE) MG tablet Take 1 tablet (325 mg total) by mouth 2 (two) times daily with a meal. 60 tablet 0  . fexofenadine (ALLEGRA) 180 MG tablet Take 1 tablet (180 mg total) by mouth daily. 30 tablet 0  . furosemide (LASIX) 20 MG tablet Take 1 tablet (20 mg total) by mouth daily. 90 tablet 1  . hydrochlorothiazide (HYDRODIURIL) 25 MG tablet Take 1 tablet (25 mg total) by mouth daily. 90 tablet 1  . meloxicam (MOBIC) 7.5 MG tablet Take 1 tablet (7.5 mg total) by mouth daily. 30 tablet 0  . metoprolol succinate (TOPROL-XL) 50 MG 24 hr tablet TAKE 1 TABLET DAILY WITH OR IMMEDIATELY FOLLOWING A MEAL. 90 tablet 1  . Multiple Vitamin (MULTIVITAMIN WITH MINERALS) TABS tablet Take 1 tablet by mouth daily.    . Nebulizers (VIOS AEROSOL DELIVERY SYSTEM) MISC 1 each as directed.  0  . omeprazole (PRILOSEC) 40 MG capsule Take 1 capsule (40 mg total) by mouth daily. 90 capsule 0  . potassium chloride (KLOR-CON) 10 MEQ tablet Take 2 tablets (20 mEq total) by mouth daily. 180 tablet 0  . sertraline  (ZOLOFT) 100 MG tablet TAKE 1 TABLET(100 MG) BY MOUTH DAILY 90 tablet 1   No current facility-administered medications on file prior to visit.    BP 127/67 (BP Location: Right Arm, Patient Position: Sitting, Cuff Size: Large)   Pulse 68   Temp Marland Kitchen)  97.4 F (36.3 C) (Temporal)   Resp 16   Wt 253 lb (114.8 kg)   SpO2 96%   BMI 39.63 kg/m       Objective:   Physical Exam Constitutional:      General: He is not in acute distress.    Appearance: He is well-developed.  HENT:     Head: Normocephalic and atraumatic.  Cardiovascular:     Rate and Rhythm: Normal rate and regular rhythm.     Heart sounds: No murmur.  Pulmonary:     Effort: Pulmonary effort is normal. No respiratory distress.     Breath sounds: Normal breath sounds. No wheezing or rales.  Skin:    General: Skin is warm and dry.  Neurological:     Mental Status: He is alert and oriented to person, place, and time.  Psychiatric:        Behavior: Behavior normal.        Thought Content: Thought content normal.           Assessment & Plan:  HTN- bp stable on current meds.  Continue same. Need to repeat K+, pt states he ran out of [place] and was taking otc K+ supplement. Advised pt to d/c supplement and restart kdur- refills sent.  Hypertriglyceridemia- obtain follow up lipid panel.   Asthma- stable, not needing to use albuterol recently. Monitor.  Hyperglycemia- last a1c was stable. Monitor.  Iron deficiency anemia- check follow up serum iron, ferritin, cbc.  I would like him to complete an IFOB and get back in with his GI for follow up colonoscopy.    Chronic back pain- at baseline. Gave pt some exercises to do at home.  This visit occurred during the SARS-CoV-2 public health emergency.  Safety protocols were in place, including screening questions prior to the visit, additional usage of staff PPE, and extensive cleaning of exam room while observing appropriate contact time as indicated for disinfecting  solutions.

## 2019-12-21 NOTE — Patient Instructions (Signed)

## 2019-12-25 ENCOUNTER — Other Ambulatory Visit: Payer: Self-pay

## 2019-12-25 MED ORDER — HYDROCHLOROTHIAZIDE 25 MG PO TABS: 25.0000 mg | ORAL_TABLET | Freq: Every day | ORAL | 1 refills | Status: DC

## 2019-12-26 ENCOUNTER — Ambulatory Visit: Payer: BC Managed Care – PPO | Admitting: Family

## 2020-01-01 ENCOUNTER — Other Ambulatory Visit: Payer: Self-pay

## 2020-01-01 ENCOUNTER — Telehealth: Payer: Self-pay | Admitting: Psychiatry

## 2020-01-01 DIAGNOSIS — K3189 Other diseases of stomach and duodenum: Secondary | ICD-10-CM | POA: Diagnosis not present

## 2020-01-01 DIAGNOSIS — K621 Rectal polyp: Secondary | ICD-10-CM | POA: Diagnosis not present

## 2020-01-01 DIAGNOSIS — K219 Gastro-esophageal reflux disease without esophagitis: Secondary | ICD-10-CM | POA: Diagnosis not present

## 2020-01-01 DIAGNOSIS — D509 Iron deficiency anemia, unspecified: Secondary | ICD-10-CM | POA: Diagnosis not present

## 2020-01-01 MED ORDER — SERTRALINE HCL 50 MG PO TABS: 50.0000 mg | ORAL_TABLET | Freq: Every day | ORAL | 1 refills | Status: DC

## 2020-01-01 NOTE — Telephone Encounter (Signed)
Rx updated for Sertraline 50 mg 1 tablet daily per last visit on 12/04/2019, sent to pharmacy

## 2020-01-01 NOTE — Telephone Encounter (Signed)
Pt said that his dose of sertraline was cut in half and he would like a rx sent in for 50mg  not 100mg . Send to [facility] in [place]

## 2020-01-02 ENCOUNTER — Other Ambulatory Visit: Payer: Self-pay

## 2020-01-02 MED ORDER — METOPROLOL SUCCINATE ER 50 MG PO TB24: ORAL_TABLET | ORAL | 1 refills | Status: DC

## 2020-01-04 ENCOUNTER — Other Ambulatory Visit: Payer: Self-pay

## 2020-01-04 ENCOUNTER — Encounter (INDEPENDENT_AMBULATORY_CARE_PROVIDER_SITE_OTHER): Payer: Medicare Other | Admitting: Ophthalmology

## 2020-01-04 DIAGNOSIS — H43813 Vitreous degeneration, bilateral: Secondary | ICD-10-CM

## 2020-01-04 DIAGNOSIS — H35033 Hypertensive retinopathy, bilateral: Secondary | ICD-10-CM | POA: Diagnosis not present

## 2020-01-04 DIAGNOSIS — I10 Essential (primary) hypertension: Secondary | ICD-10-CM | POA: Diagnosis not present

## 2020-01-04 DIAGNOSIS — H318 Other specified disorders of choroid: Secondary | ICD-10-CM

## 2020-01-04 DIAGNOSIS — D3132 Benign neoplasm of left choroid: Secondary | ICD-10-CM

## 2020-01-04 DIAGNOSIS — H3562 Retinal hemorrhage, left eye: Secondary | ICD-10-CM | POA: Diagnosis not present

## 2020-02-14 DIAGNOSIS — K3189 Other diseases of stomach and duodenum: Secondary | ICD-10-CM | POA: Diagnosis not present

## 2020-02-21 ENCOUNTER — Ambulatory Visit (INDEPENDENT_AMBULATORY_CARE_PROVIDER_SITE_OTHER): Payer: Medicare Other | Admitting: Psychiatry

## 2020-02-21 ENCOUNTER — Encounter: Payer: Self-pay | Admitting: Psychiatry

## 2020-02-21 ENCOUNTER — Other Ambulatory Visit: Payer: Self-pay

## 2020-02-21 DIAGNOSIS — F4001 Agoraphobia with panic disorder: Secondary | ICD-10-CM | POA: Diagnosis not present

## 2020-02-21 DIAGNOSIS — F411 Generalized anxiety disorder: Secondary | ICD-10-CM | POA: Diagnosis not present

## 2020-02-21 DIAGNOSIS — F3342 Major depressive disorder, recurrent, in full remission: Secondary | ICD-10-CM | POA: Diagnosis not present

## 2020-02-21 DIAGNOSIS — N539 Unspecified male sexual dysfunction: Secondary | ICD-10-CM

## 2020-02-21 NOTE — Progress Notes (Signed)
Jeremy [name] [id number] June 26, 1956 64 y.o.     Subjective:   Patient ID:  Jeremy [name] is a 64 y.o. (DOB 30-Dec-1955) male.  Chief Complaint:  Chief Complaint  Patient presents with  . Follow-up  . Medication Problem    Anxiety Patient reports no chest pain, confusion, decreased concentration, nervous/anxious behavior or suicidal ideas.    Depression        Associated symptoms include no decreased concentration and no suicidal ideas.  Past medical history includes anxiety.     Jeremy [name] presents  today for follow-up of anxiety and depression.   Jeremy [name] had been out on disability due to his back since September at that time.  Last seen April 2020.  Since retirement Jeremy [name] has been under less stress and had asked to reduce and hopefully eliminate some medications.  We reduced Abilify from 7.5 to 5 mg daily.  As of December 04, 2019 the following is noted: Still good. nOTHing worse with dose reduction.   Benefit from meds. Help depression, anxiety and anger.  They are all under control. No mood swings nor prolonged depression.  Anxiety OK.  Wants to try to reduce meds again if possible bc less stress as noted. Plan because of sexual side effects with sertraline Jeremy [name] prefers to reduce sertraline to 50 mg daily and continue Abilify 5 mg daily.  02/21/2020 appointment with the following noted: Doing well still after reducing sertraline to 50 mg daily but no improvement in sexual function.  depression and anxiety  Are still under control. No panic.    Patient reports stable mood and denies depressed or irritable moods.  Patient denies any recent difficulty with anxiety.  No panic. Patient denies difficulty with sleep initiation or maintenance. Denies appetite disturbance.  Patient reports that energy and motivation have been good.  Patient denies any difficulty with concentration.  Patient denies any suicidal ideation.  Has retired.  Has reduced his stress significantly.  Past history of  psychiatric disability. Not sure how many episodes of depression Jeremy [name]'s had in life.  Historically about the same levels of depression and anxiety   Review of Systems:  Review of Systems  Cardiovascular: Negative for chest pain.  Musculoskeletal: Positive for arthralgias and back pain.  Psychiatric/Behavioral: Positive for depression. Negative for agitation, behavioral problems, confusion, decreased concentration, dysphoric mood, hallucinations, self-injury, sleep disturbance and suicidal ideas. The patient is not nervous/anxious and is not hyperactive.     Medications: I have reviewed the patient's current medications.  Current Outpatient Medications  Medication Sig Dispense Refill  . albuterol (PROVENTIL HFA;VENTOLIN HFA) 108 (90 Base) MCG/ACT inhaler Inhale 2 puffs into the lungs every 6 (six) hours as needed for wheezing or shortness of breath. 1 Inhaler 2  . albuterol (PROVENTIL) (2.5 MG/3ML) 0.083% nebulizer solution Take 3 mLs (2.5 mg total) by nebulization every 6 (six) hours as needed for wheezing or shortness of breath. 150 mL 1  . amLODipine-benazepril (LOTREL) 10-40 MG capsule Take 1 capsule by mouth daily. 90 capsule 4  . ARIPiprazole (ABILIFY) 5 MG tablet Take 1 tablet (5 mg total) by mouth daily. 90 tablet 0  . b complex vitamins tablet Take 1 tablet by mouth daily.    . cyclobenzaprine (FLEXERIL) 10 MG tablet Take 1 tablet by mouth as directed.  0  . ferrous sulfate 325 (65 FE) MG tablet Take 1 tablet (325 mg total) by mouth 2 (two) times daily with a meal. 60 tablet 0  . fexofenadine (ALLEGRA) 180  MG tablet Take 1 tablet (180 mg total) by mouth daily. 30 tablet 0  . furosemide (LASIX) 20 MG tablet Take 1 tablet (20 mg total) by mouth daily. 90 tablet 1  . hydrochlorothiazide (HYDRODIURIL) 25 MG tablet Take 1 tablet (25 mg total) by mouth daily. 90 tablet 1  . meloxicam (MOBIC) 7.5 MG tablet Take 1 tablet (7.5 mg total) by mouth daily. 30 tablet 0  . metoprolol succinate  (TOPROL-XL) 50 MG 24 hr tablet TAKE 1 TABLET DAILY WITH OR IMMEDIATELY FOLLOWING A MEAL. 90 tablet 1  . Multiple Vitamin (MULTIVITAMIN WITH MINERALS) TABS tablet Take 1 tablet by mouth daily.    . Nebulizers (VIOS AEROSOL DELIVERY SYSTEM) MISC 1 each as directed.  0  . omeprazole (PRILOSEC) 40 MG capsule Take 1 capsule (40 mg total) by mouth daily. 90 capsule 0  . potassium chloride (KLOR-CON) 10 MEQ tablet Take 2 tablets (20 mEq total) by mouth daily. 180 tablet 1  . sertraline (ZOLOFT) 50 MG tablet Take 1 tablet (50 mg total) by mouth daily. 90 tablet 1   No current facility-administered medications for this visit.    Medication Side Effects: ED with Zoloft.  Viagra failed.  Using shots.  Allergies: No Known Allergies  Past Medical History:  Diagnosis Date  . Anemia 11/16/2014  . Bladder disease    "an unusual bladder disease; don't know what it's called" (06/30/2015)  . Bulging lumbar disc   . GERD (gastroesophageal reflux disease)   . History of hiatal hernia   . Hyperglycemia 11/16/2014  . Hyperlipemia   . Hypertension   . Joint pain, knee   . Migraine    06/30/2015 "maybe 2-3 times/year; not as bad as I used to have them"  . OSA (obstructive sleep apnea)     Family History  Problem Relation Age of Onset  . Stroke Mother 58       Died of CVA  . Hypertension Mother   . Anesthesia problems Neg Hx   . Hypotension Neg Hx   . Malignant hyperthermia Neg Hx   . Pseudochol deficiency Neg Hx     Social History   Socioeconomic History  . Marital status: Married    Spouse name: Not on file  . Number of children: 3  . Years of education: Not on file  . Highest education level: Not on file  Occupational History  . Not on file  Tobacco Use  . Smoking status: Former Smoker    Packs/day: 1.50    Years: 6.00    Pack years: 9.00    Types: Cigarettes  . Smokeless tobacco: Never Used  . Tobacco comment: "stopped smoking in ~ 1990"  Substance and Sexual Activity  . Alcohol  use: No    Alcohol/week: 0.0 standard drinks  . Drug use: No  . Sexual activity: Yes  Other Topics Concern  . 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 Difficulty of Paying Living Expenses:   Food Insecurity:   . Worried About Charity fundraiser in the Last Year:   . Arboriculturist in the Last Year:   Transportation Needs:   . Film/video editor (Medical):   Marland Kitchen Lack of Transportation (Non-Medical):   Physical Activity:   .  Days of Exercise per Week:   . Minutes of Exercise per Session:   Stress:   . Feeling of Stress :   Social Connections:   . Frequency of Communication with Friends and Family:   . Frequency of Social Gatherings with Friends and Family:   . Attends Religious Services:   . Active Member of Clubs or Organizations:   . Attends Archivist Meetings:   Marland Kitchen Marital Status:   Intimate Partner Violence:   . Fear of Current or Ex-Partner:   . Emotionally Abused:   Marland Kitchen Physically Abused:   . Sexually Abused:     Past Medical History, Surgical history, Social history, and Family history were reviewed and updated as appropriate.   Please see review of systems for further details on the patient's review from today.   Objective:   Physical Exam:  There were no vitals taken for this visit.  Physical Exam Constitutional:      General: Jeremy [name] is not in acute distress.    Appearance: Jeremy [name] is well-developed.  Musculoskeletal:        General: No deformity.  Neurological:     Mental Status: Jeremy [name] is alert and oriented to person, place, and time.     Motor: No tremor.     Coordination: Coordination normal.     Gait: Gait normal.  Psychiatric:        Attention and Perception: Attention and perception normal.        Mood and Affect: Mood is not anxious or depressed. Affect is not  labile, blunt, angry, tearful or inappropriate.        Speech: Speech normal.        Behavior: Behavior normal.        Thought Content: Thought content normal. Thought content does not include homicidal or suicidal ideation. Thought content does not include homicidal or suicidal plan.        Cognition and Memory: Cognition normal.        Judgment: Judgment normal.     Comments: Insight intact. No auditory or visual hallucinations. No delusions.      Lab Review:     Component Value Date/Time   NA 141 12/21/2019 1047   K 3.5 12/21/2019 1047   CL 101 12/21/2019 1047   CO2 34 (H) 12/21/2019 1047   GLUCOSE 94 12/21/2019 1047   BUN 17 12/21/2019 1047   CREATININE 0.88 12/21/2019 1047   CREATININE 1.05 01/13/2018 1542   CALCIUM 9.0 12/21/2019 1047   PROT 6.5 06/26/2019 1443   ALBUMIN 4.3 06/26/2019 1443   AST 18 06/26/2019 1443   ALT 27 06/26/2019 1443   ALKPHOS 90 06/26/2019 1443   BILITOT 0.4 06/26/2019 1443   GFRNONAA >60 10/25/2017 0438   GFRAA >60 10/25/2017 0438       Component Value Date/Time   WBC 7.7 12/21/2019 1047   RBC 4.65 12/21/2019 1047   HGB 14.5 12/21/2019 1047   HCT 42.3 12/21/2019 1047   PLT 268.0 12/21/2019 1047   MCV 90.9 12/21/2019 1047   MCH 30.0 10/25/2017 0438   MCHC 34.4 12/21/2019 1047   RDW 14.1 12/21/2019 1047   LYMPHSABS 2.3 12/21/2019 1047   MONOABS 0.7 12/21/2019 1047   EOSABS 0.2 12/21/2019 1047   BASOSABS 0.1 12/21/2019 1047    No results found for: POCLITH, LITHIUM   No results found for: PHENYTOIN, PHENOBARB, VALPROATE, CBMZ   .res Assessment: Plan:    Depression, major, recurrent, in complete remission ([place])  Panic disorder with  agoraphobia  Generalized anxiety disorder  Male sexual dysfunction Erectile 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 We discussed Jeremy [name] has a history of severe depression and panic disorder and anxiety  that caused Jeremy [name] to go out on disability in 11-28-2017.  Jeremy [name] has been back to work and then had to come out of disability again due to back pain.  Since the last visit Jeremy [name] has completely retired.    Jeremy [name] also has a history of short-term disability in 11/29/06 after the death of his mother and brother.  Jeremy [name] has had good response to the combination of Zoloft and Abilify.  His panic attacks have stopped and his depression is under control.    Since retiring his stress level has reduced significantly.  His depression and anxiety are under control.  Jeremy [name] wants to try to reduce medications again.  Jeremy [name] did not have any problems with the reduction in Abilify from 7.5 to 5 mg daily.  We discussed the options of trying to eliminate gradually the Abilify versus reducing the sertraline which is probably more risky.  However Jeremy [name] is having sexual side effects from sertraline and no side effects from Abilify.  Jeremy [name] prefers to stop the sertraline and see if Jeremy [name] can get by without it. Therefore reduce sertraline to 25 mg daily for 2 weeks then stop it.  Discussed the risk of worsening depression and anxiety and return of panic and if those occur call to let us know.  We also discussed the option of switching to an alternative SSRI in order to try to avoid the sexual side effects.   Disc SE in detail and SSRI withdrawal sx. Continue Abilify 5 mg daily until follow-up.  Discussed potential metabolic side effects associated with atypical antipsychotics, as well as potential risk for movement side effects. Advised Jeremy [name] to contact office if movement side effects occur.   Call if worsening sx.  Follow-up 2-3 months  Lynder Parents, MD, DFAPA  Please see After Visit Summary for patient specific instructions.  Future Appointments  Date Time Provider [place]  03/17/2020 10:20 AM [name], NP [facility]  05/08/2020  1:30 PM Hayden Pedro, MD [facility] None    No orders of the defined types were placed in this  encounter.     -------------------------------

## 2020-03-03 ENCOUNTER — Ambulatory Visit: Payer: Medicare Other | Admitting: Psychiatry

## 2020-03-07 ENCOUNTER — Other Ambulatory Visit: Payer: Self-pay | Admitting: Psychiatry

## 2020-03-07 DIAGNOSIS — F3342 Major depressive disorder, recurrent, in full remission: Secondary | ICD-10-CM

## 2020-03-17 ENCOUNTER — Ambulatory Visit (INDEPENDENT_AMBULATORY_CARE_PROVIDER_SITE_OTHER): Payer: Medicare Other | Admitting: Family

## 2020-03-17 ENCOUNTER — Other Ambulatory Visit: Payer: Self-pay

## 2020-03-17 ENCOUNTER — Encounter: Payer: Self-pay | Admitting: Family

## 2020-03-17 VITALS — BP 123/78 | HR 65 | Temp 98.6°F | Resp 16 | Ht 67.0 in | Wt 264.0 lb

## 2020-03-17 DIAGNOSIS — Z125 Encounter for screening for malignant neoplasm of prostate: Secondary | ICD-10-CM

## 2020-03-17 DIAGNOSIS — D509 Iron deficiency anemia, unspecified: Secondary | ICD-10-CM | POA: Diagnosis not present

## 2020-03-17 DIAGNOSIS — Z23 Encounter for immunization: Secondary | ICD-10-CM

## 2020-03-17 DIAGNOSIS — E785 Hyperlipidemia, unspecified: Secondary | ICD-10-CM

## 2020-03-17 DIAGNOSIS — R739 Hyperglycemia, unspecified: Secondary | ICD-10-CM | POA: Diagnosis not present

## 2020-03-17 DIAGNOSIS — Z0000 Encounter for general adult medical examination without abnormal findings: Secondary | ICD-10-CM | POA: Diagnosis not present

## 2020-03-17 DIAGNOSIS — E1169 Type 2 diabetes mellitus with other specified complication: Secondary | ICD-10-CM | POA: Diagnosis not present

## 2020-03-17 DIAGNOSIS — I5032 Chronic diastolic (congestive) heart failure: Secondary | ICD-10-CM

## 2020-03-17 DIAGNOSIS — G4733 Obstructive sleep apnea (adult) (pediatric): Secondary | ICD-10-CM

## 2020-03-17 DIAGNOSIS — K219 Gastro-esophageal reflux disease without esophagitis: Secondary | ICD-10-CM

## 2020-03-17 LAB — HEMOGLOBIN A1C: Hgb A1c MFr Bld: 5.6 % (ref 4.6–6.5)

## 2020-03-17 LAB — CBC WITH DIFFERENTIAL/PLATELET
Basophils Absolute: 0.1 10*3/uL (ref 0.0–0.1)
Basophils Relative: 1.1 % (ref 0.0–3.0)
Eosinophils Absolute: 0.1 10*3/uL (ref 0.0–0.7)
Eosinophils Relative: 2.2 % (ref 0.0–5.0)
HCT: 41.5 % (ref 39.0–52.0)
Hemoglobin: 14.2 g/dL (ref 13.0–17.0)
Lymphocytes Relative: 30.2 % (ref 12.0–46.0)
Lymphs Abs: 1.9 10*3/uL (ref 0.7–4.0)
MCHC: 34.3 g/dL (ref 30.0–36.0)
MCV: 91.6 fl (ref 78.0–100.0)
Monocytes Absolute: 0.6 10*3/uL (ref 0.1–1.0)
Monocytes Relative: 9.3 % (ref 3.0–12.0)
Neutro Abs: 3.6 10*3/uL (ref 1.4–7.7)
Neutrophils Relative %: 57.2 % (ref 43.0–77.0)
Platelets: 234 10*3/uL (ref 150.0–400.0)
RBC: 4.53 Mil/uL (ref 4.22–5.81)
RDW: 13.8 % (ref 11.5–15.5)
WBC: 6.3 10*3/uL (ref 4.0–10.5)

## 2020-03-17 LAB — IBC + FERRITIN
Ferritin: 175.5 ng/mL (ref 22.0–322.0)
Iron: 87 ug/dL (ref 42–165)
Saturation Ratios: 24.1 % (ref 20.0–50.0)
Transferrin: 258 mg/dL (ref 212.0–360.0)

## 2020-03-17 LAB — COMPREHENSIVE METABOLIC PANEL
ALT: 32 U/L (ref 0–53)
AST: 21 U/L (ref 0–37)
Albumin: 4.1 g/dL (ref 3.5–5.2)
Alkaline Phosphatase: 66 U/L (ref 39–117)
BUN: 12 mg/dL (ref 6–23)
CO2: 32 mEq/L (ref 19–32)
Calcium: 9.4 mg/dL (ref 8.4–10.5)
Chloride: 102 mEq/L (ref 96–112)
Creatinine, Ser: 0.91 mg/dL (ref 0.40–1.50)
GFR: 83.54 mL/min (ref >60.00)
Glucose, Bld: 107 mg/dL — ABNORMAL HIGH (ref 70–99)
Potassium: 4 mEq/L (ref 3.5–5.1)
Sodium: 140 mEq/L (ref 135–145)
Total Bilirubin: 0.6 mg/dL (ref 0.2–1.2)
Total Protein: 6.2 g/dL (ref 6.0–8.3)

## 2020-03-17 LAB — LIPID PANEL
Cholesterol: 171 mg/dL (ref 0–200)
HDL: 35 mg/dL — ABNORMAL LOW (ref >39.00)
NonHDL: 135.88
Total CHOL/HDL Ratio: 5
Triglycerides: 213 mg/dL — ABNORMAL HIGH (ref 0.0–149.0)
VLDL: 42.6 mg/dL — ABNORMAL HIGH (ref 0.0–40.0)

## 2020-03-17 LAB — PSA, MEDICARE: PSA: 0.57 ng/ml (ref 0.10–4.00)

## 2020-03-17 LAB — LDL CHOLESTEROL, DIRECT: Direct LDL: 114 mg/dL

## 2020-03-17 NOTE — Patient Instructions (Addendum)
Please schedule follow up with Dr. Elsworth Soho- Cardiolgy.  Work on [facility], and reducing your portions and snacks. Complete lab work prior to leaving. Please consider completing the [place] Advanced Directive form and then bring [place] a copy for your chart.    Preventive Care 34 Years and Older, Male Preventive care refers to lifestyle choices and visits with your health care provider that can promote health and wellness. This includes:  A yearly physical exam. This is also called an annual well check.  Regular dental and eye exams.  Immunizations.  Screening for certain conditions.  Healthy lifestyle choices, such as diet and exercise. What can I expect for my preventive care visit? Physical exam Your health care provider will check:  Height and weight. These may be used to calculate body mass index (BMI), which is a measurement that tells if you are at a healthy weight.  Heart rate and blood pressure.  Your skin for abnormal spots. Counseling Your health care provider may ask you questions about:  Alcohol, tobacco, and drug use.  Emotional well-being.  Home and relationship well-being.  Sexual activity.  Eating habits.  History of falls.  Memory and ability to understand (cognition).  Work and work Statistician. What immunizations do I need?  Influenza (flu) vaccine  This is recommended every year. Tetanus, diphtheria, and pertussis (Tdap) vaccine  You may need a Td booster every 10 years. Varicella (chickenpox) vaccine  You may need this vaccine if you have not already been vaccinated. Zoster (shingles) vaccine  You may need this after age 80. Pneumococcal conjugate (PCV13) vaccine  One dose is recommended after age 28. Pneumococcal polysaccharide (PPSV23) vaccine  One dose is recommended after age 46. Measles, mumps, and rubella (MMR) vaccine  You may need at least one dose of MMR if you were born in 1957 or later. You may also need a second  dose. Meningococcal conjugate (MenACWY) vaccine  You may need this if you have certain conditions. Hepatitis A vaccine  You may need this if you have certain conditions or if you travel or work in places where you may be exposed to hepatitis A. Hepatitis B vaccine  You may need this if you have certain conditions or if you travel or work in places where you may be exposed to hepatitis B. Haemophilus influenzae type b (Hib) vaccine  You may need this if you have certain conditions. You may receive vaccines as individual doses or as more than one vaccine together in one shot (combination vaccines). Talk with your health care provider about the risks and benefits of combination vaccines. What tests do I need? Blood tests  Lipid and cholesterol levels. These may be checked every 5 years, or more frequently depending on your overall health.  Hepatitis C test.  Hepatitis B test. Screening  Lung cancer screening. You may have this screening every year starting at age 25 if you have a 30-pack-year history of smoking and currently smoke or have quit within the past 15 years.  Colorectal cancer screening. All adults should have this screening starting at age 62 and continuing until age 43. Your health care provider may recommend screening at age 59 if you are at increased risk. You will have tests every 1-10 years, depending on your results and the type of screening test.  Prostate cancer screening. Recommendations will vary depending on your family history and other risks.  Diabetes screening. This is done by checking your blood sugar (glucose) after you have not eaten  for a while (fasting). You may have this done every 1-3 years.  Abdominal aortic aneurysm (AAA) screening. You may need this if you are a current or former smoker.  Sexually transmitted disease (STD) testing. Follow these instructions at home: Eating and drinking  Eat a diet that includes fresh fruits and vegetables, whole  grains, lean protein, and low-fat dairy products. Limit your intake of foods with high amounts of sugar, saturated fats, and salt.  Take vitamin and mineral supplements as recommended by your health care provider.  Do not drink alcohol if your health care provider tells you not to drink.  If you drink alcohol: ? Limit how much you have to 0-2 drinks a day. ? Be aware of how much alcohol is in your drink. In the [place] one drink equals one 12 oz bottle of beer (355 mL), one 5 oz glass of wine (148 mL), or one 1 oz glass of hard liquor (44 mL). Lifestyle  Take daily care of your teeth and gums.  Stay active. Exercise for at least 30 minutes on 5 or more days each week.  Do not use any products that contain nicotine or tobacco, such as cigarettes, e-cigarettes, and chewing tobacco. If you need help quitting, ask your health care provider.  If you are sexually active, practice safe sex. Use a condom or other form of protection to prevent STIs (sexually transmitted infections).  Talk with your health care provider about taking a low-dose aspirin or statin. What's next?  Visit your health care provider once a year for a well check visit.  Ask your health care provider how often you should have your eyes and teeth checked.  Stay up to date on all vaccines. This information is not intended to replace advice given to you by your health care provider. Make sure you discuss any questions you have with your health care provider. Document Revised: 08/03/2018 Document Reviewed: 08/03/2018 Elsevier Patient Education  2020 [employer].

## 2020-03-17 NOTE — Progress Notes (Signed)
Subjective:   Subjective:    Jeremy [name] is a 64 y.o. male who presents for Medicare Initial preventive examination.   Preventive Screening-Counseling & Management  Tobacco Social History   Tobacco Use  Smoking Status Former Smoker   Packs/day: 1.50   Years: 6.00   Pack years: 9.00   Types: Cigarettes  Smokeless Tobacco Never Used  Tobacco Comment   "stopped smoking in ~ 1990"    Problems Prior to Visit  1. Anemia- maintained on iron 325mg  bid. IFOB was ordered last visit but was not completed. He has never had a colonoscopy.  Lab Results  Component Value Date   WBC 7.7 12/21/2019   HGB 14.5 12/21/2019   HCT 42.3 12/21/2019   MCV 90.9 12/21/2019   PLT 268.0 12/21/2019   Depression/Anxiety-followed by Dr. Clovis Pu. Reports that sertraline was discontinued.  He is on abilify.   GERD- stable on omeprazole.   Hyperglycemia-  Lab Results  Component Value Date   HGBA1C 5.4 06/26/2019   OSA- reports good compliance with cpap  Morbid obesity-  BP Readings from Last 3 Encounters:  03/17/20 123/78  12/21/19 127/67  09/28/19 127/68   Chronic diastolic heart failure- reports that he only has some mild DOE.   Hyperlipidemia-  Lab Results  Component Value Date   CHOL 183 12/21/2019   HDL 33.10 (L) 12/21/2019   LDLCALC 139 (H) 06/30/2015   LDLDIRECT 113.0 12/21/2019   TRIG 238.0 (H) 12/21/2019   CHOLHDL 6 12/21/2019    Current Problems (verified) Patient Active Problem List   Diagnosis Date Noted   Morbid obesity ([place]) 03/07/2018   Dyspnea 10/22/2017   Acute on chronic diastolic CHF (congestive heart failure) ([place])    Hyperlipidemia    Chronic diastolic heart failure ([place])    Atypical chest pain 06/30/2015   OSA (obstructive sleep apnea) 02/10/2015   GERD (gastroesophageal reflux disease) 02/10/2015   Anxiety and depression 02/10/2015   Anemia 11/16/2014   Preventative health care 11/16/2014   Hyperglycemia 11/16/2014   HTN  (hypertension) 10/10/2014   Allergic rhinitis 10/10/2014    Medications Prior to Visit Current Outpatient Medications on File Prior to Visit  Medication Sig Dispense Refill   albuterol (PROVENTIL HFA;VENTOLIN HFA) 108 (90 Base) MCG/ACT inhaler Inhale 2 puffs into the lungs every 6 (six) hours as needed for wheezing or shortness of breath. 1 Inhaler 2   albuterol (PROVENTIL) (2.5 MG/3ML) 0.083% nebulizer solution Take 3 mLs (2.5 mg total) by nebulization every 6 (six) hours as needed for wheezing or shortness of breath. 150 mL 1   amLODipine-benazepril (LOTREL) 10-40 MG capsule Take 1 capsule by mouth daily. 90 capsule 4   ARIPiprazole (ABILIFY) 5 MG tablet TAKE 1 TABLET(5 MG) BY MOUTH DAILY 90 tablet 0   b complex vitamins tablet Take 1 tablet by mouth daily.     cyclobenzaprine (FLEXERIL) 10 MG tablet Take 1 tablet by mouth as directed.  0   ferrous sulfate 325 (65 FE) MG tablet Take 1 tablet (325 mg total) by mouth 2 (two) times daily with a meal. 60 tablet 0   fexofenadine (ALLEGRA) 180 MG tablet Take 1 tablet (180 mg total) by mouth daily. 30 tablet 0   furosemide (LASIX) 20 MG tablet Take 1 tablet (20 mg total) by mouth daily. 90 tablet 1   hydrochlorothiazide (HYDRODIURIL) 25 MG tablet Take 1 tablet (25 mg total) by mouth daily. 90 tablet 1   meloxicam (MOBIC) 7.5 MG tablet Take 1 tablet (7.5 mg total)  by mouth daily. 30 tablet 0   metoprolol succinate (TOPROL-XL) 50 MG 24 hr tablet TAKE 1 TABLET DAILY WITH OR IMMEDIATELY FOLLOWING A MEAL. 90 tablet 1   Multiple Vitamin (MULTIVITAMIN WITH MINERALS) TABS tablet Take 1 tablet by mouth daily.     Nebulizers (VIOS AEROSOL DELIVERY SYSTEM) MISC 1 each as directed.  0   omeprazole (PRILOSEC) 40 MG capsule Take 1 capsule (40 mg total) by mouth daily. 90 capsule 0   potassium chloride (KLOR-CON) 10 MEQ tablet Take 2 tablets (20 mEq total) by mouth daily. 180 tablet 1   No current facility-administered medications on file prior  to visit.    Current Medications (verified) Current Outpatient Medications  Medication Sig Dispense Refill   albuterol (PROVENTIL HFA;VENTOLIN HFA) 108 (90 Base) MCG/ACT inhaler Inhale 2 puffs into the lungs every 6 (six) hours as needed for wheezing or shortness of breath. 1 Inhaler 2   albuterol (PROVENTIL) (2.5 MG/3ML) 0.083% nebulizer solution Take 3 mLs (2.5 mg total) by nebulization every 6 (six) hours as needed for wheezing or shortness of breath. 150 mL 1   amLODipine-benazepril (LOTREL) 10-40 MG capsule Take 1 capsule by mouth daily. 90 capsule 4   ARIPiprazole (ABILIFY) 5 MG tablet TAKE 1 TABLET(5 MG) BY MOUTH DAILY 90 tablet 0   b complex vitamins tablet Take 1 tablet by mouth daily.     cyclobenzaprine (FLEXERIL) 10 MG tablet Take 1 tablet by mouth as directed.  0   ferrous sulfate 325 (65 FE) MG tablet Take 1 tablet (325 mg total) by mouth 2 (two) times daily with a meal. 60 tablet 0   fexofenadine (ALLEGRA) 180 MG tablet Take 1 tablet (180 mg total) by mouth daily. 30 tablet 0   furosemide (LASIX) 20 MG tablet Take 1 tablet (20 mg total) by mouth daily. 90 tablet 1   hydrochlorothiazide (HYDRODIURIL) 25 MG tablet Take 1 tablet (25 mg total) by mouth daily. 90 tablet 1   meloxicam (MOBIC) 7.5 MG tablet Take 1 tablet (7.5 mg total) by mouth daily. 30 tablet 0   metoprolol succinate (TOPROL-XL) 50 MG 24 hr tablet TAKE 1 TABLET DAILY WITH OR IMMEDIATELY FOLLOWING A MEAL. 90 tablet 1   Multiple Vitamin (MULTIVITAMIN WITH MINERALS) TABS tablet Take 1 tablet by mouth daily.     Nebulizers (VIOS AEROSOL DELIVERY SYSTEM) MISC 1 each as directed.  0   omeprazole (PRILOSEC) 40 MG capsule Take 1 capsule (40 mg total) by mouth daily. 90 capsule 0   potassium chloride (KLOR-CON) 10 MEQ tablet Take 2 tablets (20 mEq total) by mouth daily. 180 tablet 1   No current facility-administered medications for this visit.     Allergies (verified) Patient has no known allergies.    PAST HISTORY  Family History Family History  Problem Relation Age of Onset   Stroke Mother 9       Died of CVA   Hypertension Mother    Anesthesia problems Neg Hx    Hypotension Neg Hx    Malignant hyperthermia Neg Hx    Pseudochol deficiency Neg Hx     Social History Social History   Tobacco Use   Smoking status: Former Smoker    Packs/day: 1.50    Years: 6.00    Pack years: 9.00    Types: Cigarettes   Smokeless tobacco: Never Used   Tobacco comment: "stopped smoking in ~ 1990"  Substance Use Topics   Alcohol use: No    Alcohol/week: 0.0 standard drinks  Are there smokers in your home (other than you)?  No  Risk Factors Current exercise habits: some walking, limited by back pain  Dietary issues discussed: rare soda/juices  Wt Readings from Last 3 Encounters:  03/17/20 (!) 264 lb (119.7 kg)  12/21/19 253 lb (114.8 kg)  09/28/19 253 lb (114.8 kg)      Cardiac risk factors: age, hyperlipiemia  Depression Screen (Note: if answer to either of the following is "Yes", a more complete depression screening is indicated)   Q1: Over the past two weeks, have you felt down, depressed or hopeless? No  Q2: Over the past two weeks, have you felt little interest or pleasure in doing things? No  Have you lost interest or pleasure in daily life? No  Do you often feel hopeless? No  Do you cry easily over simple problems? No  Activities of Daily Living In your present state of health, do you have any difficulty performing the following activities?:  Driving? no Managing money?  No Feeding yourself? no Getting from bed to chair? no Climbing a flight of stairs?  Preparing food and eating?: No Bathing or showering? No Getting dressed: No Getting to the toilet? No Using the toilet:No Moving around from place to place: No In the past year have you fallen or had a near fall?:No   Are you sexually active?  Yes  Do you have more than one partner?   No  Hearing Difficulties: Yes Do you often ask people to speak up or repeat themselves? Yes Do you experience ringing or noises in your ears? sometimes Do you have difficulty understanding soft or whispered voices? Yes   Do you feel that you have a problem with memory?  Sometimes, family does not complain. Forgets where he placed his keys  Do you often misplace items? sometimes Do you feel safe at home?  Yes  Cognitive Testing  Alert? Yes  Normal Appearance?Yes  Oriented to person? Yes  Place? Yes   Time? Yes  Recall of three objects?  Yes  Can perform simple calculations? Yes  Displays appropriate judgment?Yes  Can read the correct time from a watch face?Yes   Advanced Directives have been discussed with the patient? Yes   List the Names of Other Physician/Practitioners you currently use: 1.  Dr. Elsworth Soho 2.  Dr. Sigurd Sos any recent Medical Services you may have received from other than [facility] providers in the past year (date may be approximate).  Immunization History  Administered Date(s) Administered   Influenza,inj,Quad PF,6+ Mos 05/03/2016, 05/16/2017, 05/02/2018, 05/24/2019   Influenza-Unspecified 05/23/2014, 05/07/2015, 05/19/2015   Moderna SARS-COVID-2 Vaccination 09/24/2019, 10/22/2019   Tdap 11/11/2014   Zoster Recombinat (Shingrix) 07/19/2018, 10/20/2018    Screening Tests Health Maintenance  Topic Date Due   COLONOSCOPY  09/23/2018   PNA vac Low Risk Adult (1 of 2 - PCV13) Never done   INFLUENZA VACCINE  03/23/2020   TETANUS/TDAP  11/10/2024   COVID-19 Vaccine  Completed   Hepatitis C Screening  Completed   HIV Screening  Completed    All answers were reviewed with the patient and necessary referrals were made:  Nance Pear, NP   03/17/2020   History reviewed: allergies, current medications, past family history, past medical history, past social history, past surgical history and problem list  Immunizations- pneumovax  today  Review of Systems Pertinent items are noted in HPI.    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 Wears a contact in the right eye  Visual Acuity Screening   Right eye Left eye Both eyes  Without correction: 20/20 20/100 20/20  With correction:       Blood pressure 123/78, pulse 65, temperature 98.6 F (37 C), temperature source Oral, resp. rate 16, height 5\' 7"  (1.702 m), weight (!) 264 lb (119.7 kg), SpO2 94 %. Body mass index is 41.35 kg/m.  Physical Exam  Constitutional: He is oriented to person, place, and time. He appears well-developed and well-nourished. No distress.  HENT:  Head: Normocephalic and atraumatic.  Right Ear: Tympanic membrane and ear canal normal.  Left Ear: Tympanic membrane and ear canal normal.  Mouth/Throat: Oropharynx is clear and moist.  Eyes: Pupils are equal, round, and reactive to light. No scleral icterus.  Neck: Normal range of motion. No thyromegaly present.  Cardiovascular: Normal rate and regular rhythm.   No murmur heard. trace bilateral LE edema.  Pulmonary/Chest: Effort normal and breath sounds normal. No respiratory distress. He has no wheezes. He has no rales. He exhibits no tenderness.  Abdominal: Soft. Bowel sounds are normal. He exhibits no distension and no mass. There is no tenderness. There is no rebound and no guarding.  Musculoskeletal: He exhibits no edema.  Lymphadenopathy:    He has no cervical adenopathy.  Neurological: He is alert and oriented to person, place, and time. He has normal patellar reflexes. He exhibits normal muscle tone. Coordination normal.  Skin: Skin is warm and dry.  Psychiatric: He has a normal mood and affect. His behavior is normal. Judgment and thought content normal.           Assessment & Plan:   Hyperglycemia- obtain follow up A1C. Discussed importance of diet/exercise/weight loss.  OSA- good compliance with cpap.  Hyperlipidemia-obtain follow up  lipid panel.  GERD- stable on PPI.  Diastolic chf- appears euvolemic today. Advised pt to schedule a follow up with cardiology.    Anemia- I would like him to complete an IFOB and have re-ordered this. He did recently see GI- their notes stated last colo was 2015.    This visit occurred during the SARS-CoV-2 public health emergency.  Safety protocols were in place, including screening questions prior to the visit, additional usage of staff PPE, and extensive cleaning of exam room while observing appropriate contact time as indicated for disinfecting solutions.        Assessment:         Plan:     During the course of the visit the patient was educated and counseled about appropriate screening and preventive services including:    Pneumococcal vaccine   Advanced directives: pt has no advanced directive- pt given copy of [place] advanced directive form  Diet review for nutrition referral? Yes ____  Not Indicated ____   Patient Instructions (the written plan) was given to the patient.  Medicare Attestation I have personally reviewed: The patient's medical and social history Their use of alcohol, tobacco or illicit drugs Their current medications and supplements The patient's functional ability including ADLs,fall risks, home safety risks, cognitive, and hearing and visual impairment Diet and physical activities Evidence for depression or mood disorders  The patient's weight, height, BMI, and visual acuity have been recorded in the chart.  I have made referrals, counseling, and provided education to the patient based on review of the above and I have provided the patient with a written personalized care plan for preventive services.  Nance Pear, NP   03/17/2020

## 2020-03-19 ENCOUNTER — Other Ambulatory Visit (INDEPENDENT_AMBULATORY_CARE_PROVIDER_SITE_OTHER): Payer: Medicare Other

## 2020-03-19 DIAGNOSIS — D649 Anemia, unspecified: Secondary | ICD-10-CM

## 2020-03-19 LAB — FECAL OCCULT BLOOD, IMMUNOCHEMICAL: Fecal Occult Bld: NEGATIVE

## 2020-03-21 ENCOUNTER — Encounter: Payer: Self-pay | Admitting: Family

## 2020-05-07 ENCOUNTER — Other Ambulatory Visit: Payer: Self-pay

## 2020-05-07 ENCOUNTER — Encounter: Payer: Self-pay | Admitting: Psychiatry

## 2020-05-07 ENCOUNTER — Ambulatory Visit (INDEPENDENT_AMBULATORY_CARE_PROVIDER_SITE_OTHER): Payer: Medicare Other | Admitting: Psychiatry

## 2020-05-07 DIAGNOSIS — N539 Unspecified male sexual dysfunction: Secondary | ICD-10-CM | POA: Diagnosis not present

## 2020-05-07 DIAGNOSIS — F4001 Agoraphobia with panic disorder: Secondary | ICD-10-CM | POA: Diagnosis not present

## 2020-05-07 DIAGNOSIS — F411 Generalized anxiety disorder: Secondary | ICD-10-CM

## 2020-05-07 DIAGNOSIS — F3342 Major depressive disorder, recurrent, in full remission: Secondary | ICD-10-CM | POA: Diagnosis not present

## 2020-05-07 NOTE — Progress Notes (Signed)
Jeremy [name] [id number] Jan 13, 1956 64 y.o.     Subjective:   Patient ID:  Jeremy [name] is a 64 y.o. (DOB 06-27-1956) male.  Chief Complaint:  Chief Complaint  Patient presents with  . Follow-up    Medication Management , stopped sertraline  . Depression    Medication Management    Anxiety Patient reports no chest pain, confusion, decreased concentration, nervous/anxious behavior or suicidal ideas.    Depression        Associated symptoms include no decreased concentration and no suicidal ideas.  Past medical history includes anxiety.     Jeremy [name] presents  today for follow-up of anxiety and depression.   He had been out on disability due to his back since September at that time.  seen April 2020.  Since retirement he has been under less stress and had asked to reduce and hopefully eliminate some medications.  We reduced Abilify from 7.5 to 5 mg daily.  As of December 04, 2019 the following is noted: Still good. nOTHing worse with dose reduction.   Benefit from meds. Help depression, anxiety and anger.  They are all under control. No mood swings nor prolonged depression.  Anxiety OK.  Wants to try to reduce meds again if possible bc less stress as noted. Plan because of sexual side effects with sertraline he prefers to reduce sertraline to 50 mg daily and continue Abilify 5 mg daily.  02/21/2020 appointment with the following noted: Doing well still after reducing sertraline to 50 mg daily but no improvement in sexual function.  depression and anxiety  Are still under control. No panic.   Plan:  We discussed the options of trying to eliminate gradually the Abilify versus reducing the sertraline which is probably more risky.  However he is having sexual side effects from sertraline and no side effects from Abilify.  He prefers to stop the sertraline and see if he can get by without it. Therefore reduce sertraline to 25 mg daily for 2 weeks then stop it  05/07/20 appt with  the following noted: Off sertraline without more depression or anxiety.  No panic.  No withdrawal effects. Sexual function is not better.  Erection problems. Tried Viagra without help.  Saw urologist and had shots which helped then stopped helping. He wants to try stopping Abilify to see if sexual function will return.  Patient reports stable mood and denies depressed or irritable moods.  Patient denies any recent difficulty with anxiety.  No panic. Patient denies difficulty with sleep initiation or maintenance. Denies appetite disturbance.  Patient reports that energy and motivation have been good.  Patient denies any difficulty with concentration.  Patient denies any suicidal ideation.  Has retired.  Has reduced his stress significantly.  Past history of psychiatric disability. Not sure how many episodes of depression he's had in life.  Historically about the same levels of depression and anxiety   Review of Systems:  Review of Systems  Cardiovascular: Negative for chest pain.  Genitourinary:       ED persistent  Musculoskeletal: Positive for arthralgias and back pain.  Psychiatric/Behavioral: Positive for depression. Negative for agitation, behavioral problems, confusion, decreased concentration, dysphoric mood, hallucinations, self-injury, sleep disturbance and suicidal ideas. The patient is not nervous/anxious and is not hyperactive.     Medications: I have reviewed the patient's current medications.  Current Outpatient Medications  Medication Sig Dispense Refill  . albuterol (PROVENTIL HFA;VENTOLIN HFA) 108 (90 Base) MCG/ACT inhaler Inhale 2 puffs into the  lungs every 6 (six) hours as needed for wheezing or shortness of breath. 1 Inhaler 2  . albuterol (PROVENTIL) (2.5 MG/3ML) 0.083% nebulizer solution Take 3 mLs (2.5 mg total) by nebulization every 6 (six) hours as needed for wheezing or shortness of breath. 150 mL 1  . amLODipine-benazepril (LOTREL) 10-40 MG capsule Take 1 capsule  by mouth daily. 90 capsule 4  . ARIPiprazole (ABILIFY) 5 MG tablet TAKE 1 TABLET(5 MG) BY MOUTH DAILY 90 tablet 0  . b complex vitamins tablet Take 1 tablet by mouth daily.    . cyclobenzaprine (FLEXERIL) 10 MG tablet Take 1 tablet by mouth as directed.  0  . ferrous sulfate 325 (65 FE) MG tablet Take 1 tablet (325 mg total) by mouth 2 (two) times daily with a meal. 60 tablet 0  . fexofenadine (ALLEGRA) 180 MG tablet Take 1 tablet (180 mg total) by mouth daily. 30 tablet 0  . furosemide (LASIX) 20 MG tablet Take 1 tablet (20 mg total) by mouth daily. 90 tablet 1  . hydrochlorothiazide (HYDRODIURIL) 25 MG tablet Take 1 tablet (25 mg total) by mouth daily. 90 tablet 1  . meloxicam (MOBIC) 7.5 MG tablet Take 1 tablet (7.5 mg total) by mouth daily. 30 tablet 0  . metoprolol succinate (TOPROL-XL) 50 MG 24 hr tablet TAKE 1 TABLET DAILY WITH OR IMMEDIATELY FOLLOWING A MEAL. 90 tablet 1  . Multiple Vitamin (MULTIVITAMIN WITH MINERALS) TABS tablet Take 1 tablet by mouth daily.    . Nebulizers (VIOS AEROSOL DELIVERY SYSTEM) MISC 1 each as directed.  0  . omeprazole (PRILOSEC) 40 MG capsule Take 1 capsule (40 mg total) by mouth daily. 90 capsule 0  . potassium chloride (KLOR-CON) 10 MEQ tablet Take 2 tablets (20 mEq total) by mouth daily. 180 tablet 1   No current facility-administered medications for this visit.    Medication Side Effects: ED with Zoloft.  Viagra failed.  Using shots.  Allergies: No Known Allergies  Past Medical History:  Diagnosis Date  . Anemia 11/16/2014  . Bladder disease    "an unusual bladder disease; don't know what it's called" (06/30/2015)  . Bulging lumbar disc   . GERD (gastroesophageal reflux disease)   . History of hiatal hernia   . Hyperglycemia 11/16/2014  . Hyperlipemia   . Hypertension   . Joint pain, knee   . Migraine    06/30/2015 "maybe 2-3 times/year; not as bad as I used to have them"  . OSA (obstructive sleep apnea)     Family History  Problem  Relation Age of Onset  . Stroke Mother 18       Died of CVA  . Hypertension Mother   . Anesthesia problems Neg Hx   . Hypotension Neg Hx   . Malignant hyperthermia Neg Hx   . Pseudochol deficiency Neg Hx     Social History   Socioeconomic History  . Marital status: Married    Spouse name: Not on file  . Number of children: 3  . Years of education: Not on file  . Highest education level: Not on file  Occupational History  . Not on file  Tobacco Use  . Smoking status: Former Smoker    Packs/day: 1.50    Years: 6.00    Pack years: 9.00    Types: Cigarettes  . Smokeless tobacco: Never Used  . Tobacco comment: "stopped smoking in ~ 1990"  Substance and Sexual Activity  . Alcohol use: No    Alcohol/week: 0.0 standard  drinks  . Drug use: No  . Sexual activity: Yes  Other Topics Concern  . 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 Difficulty of Paying Living Expenses: Not on file  Food Insecurity:   . Worried About Charity fundraiser in the Last Year: Not on file  . Ran Out of Food in the Last Year: Not on file  Transportation Needs:   . Lack of Transportation (Medical): Not on file  . Lack of Transportation (Non-Medical): Not on file  Physical Activity:   . Days of Exercise per Week: Not on file  . Minutes of Exercise per Session: Not on file  Stress:   . Feeling of Stress : Not on file  Social Connections:   . Frequency of Communication with Friends and Family: Not on file  . Frequency of Social Gatherings with Friends and Family: Not on file  . Attends Religious Services: Not on file  . Active Member of Clubs or Organizations: Not on file  . Attends Archivist Meetings: Not on file  . Marital Status: Not on file  Intimate Partner Violence:   . Fear of Current or  Ex-Partner: Not on file  . Emotionally Abused: Not on file  . Physically Abused: Not on file  . Sexually Abused: Not on file    Past Medical History, Surgical history, Social history, and Family history were reviewed and updated as appropriate.   Please see review of systems for further details on the patient's review from today.   Objective:   Physical Exam:  There were no vitals taken for this visit.  Physical Exam Constitutional:      General: He is not in acute distress.    Appearance: He is well-developed.  Musculoskeletal:        General: No deformity.  Neurological:     Mental Status: He is alert and oriented to person, place, and time.     Motor: No tremor.     Coordination: Coordination normal.     Gait: Gait normal.  Psychiatric:        Attention and Perception: Attention and perception normal.        Mood and Affect: Mood is not anxious or depressed. Affect is tearful. Affect is not labile, blunt, angry or inappropriate.        Speech: Speech normal.        Behavior: Behavior normal.        Thought Content: Thought content normal. Thought content does not include homicidal or suicidal ideation. Thought content does not include homicidal or suicidal plan.        Cognition and Memory: Cognition normal.        Judgment: Judgment normal.     Comments: Insight intact. No auditory or visual hallucinations. No delusions.      Lab Review:     Component Value Date/Time   NA 140 03/17/2020 1111   K 4.0 03/17/2020 1111   CL 102 03/17/2020 1111   CO2 32 03/17/2020 1111   GLUCOSE 107 (H) 03/17/2020 1111   BUN 12 03/17/2020 1111   CREATININE 0.91 03/17/2020 1111   CREATININE 1.05 01/13/2018 1542   CALCIUM 9.4 03/17/2020 1111   PROT 6.2 03/17/2020 1111   ALBUMIN 4.1 03/17/2020  1111   AST 21 03/17/2020 1111   ALT 32 03/17/2020 1111   ALKPHOS 66 03/17/2020 1111   BILITOT 0.6 03/17/2020 1111   GFRNONAA >60 10/25/2017 0438   GFRAA >60 10/25/2017 0438        Component Value Date/Time   WBC 6.3 03/17/2020 1111   RBC 4.53 03/17/2020 1111   HGB 14.2 03/17/2020 1111   HCT 41.5 03/17/2020 1111   PLT 234.0 03/17/2020 1111   MCV 91.6 03/17/2020 1111   MCH 30.0 10/25/2017 0438   MCHC 34.3 03/17/2020 1111   RDW 13.8 03/17/2020 1111   LYMPHSABS 1.9 03/17/2020 1111   MONOABS 0.6 03/17/2020 1111   EOSABS 0.1 03/17/2020 1111   BASOSABS 0.1 03/17/2020 1111    No results found for: POCLITH, LITHIUM   No results found for: PHENYTOIN, PHENOBARB, VALPROATE, CBMZ   .res Assessment: Plan:    Depression, major, recurrent, in complete remission [facility])  Male sexual dysfunction  Panic disorder with agoraphobia  Generalized anxiety disorder Erectile 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 We discussed [name] has a history of severe depression and panic disorder and anxiety that caused him to go out on disability in 12-10-17.  He has been back to work and then had to come out of disability again due to back pain.  Since the last visit he has completely retired.    He also has a history of short-term disability in 12/11/2006 after the death of his mother and brother.  He has had good response to the combination of Zoloft and Abilify.  His panic attacks have stopped and his depression is under control.    Since retiring his stress level has reduced significantly.  His depression and anxiety are under control.  He wants to try to reduce medications again.   He wants to try DC Abilify to see if sexual function is better despite risk relapse. He's been fine so far off sertraline.    Talk to primary care doctor about whether metoprolol could be causing sexual problems now that you are off sertraline.  Discussed potential metabolic side effects associated with atypical antipsychotics, as well as potential risk for movement side effects. Advised pt to contact office if movement  side effects occur.   Call if worsening sx.  Follow-up 3 months  Lynder Parents, MD, DFAPA  Please see After Visit Summary for patient specific instructions.  Future Appointments  Date Time Provider [place]  05/08/2020  1:30 PM Hayden Pedro, MD [facility] None  09/17/2020 10:00 AM [name], NP [id number]    No orders of the defined types were placed in this encounter.     -------------------------------

## 2020-05-07 NOTE — Patient Instructions (Addendum)
Talk to primary care doctor about whether metoprolol could be causing sexual problems now that you are off sertraline.  Stop aripiprazole.  Call if depression or anxiety recur.

## 2020-05-08 ENCOUNTER — Encounter (INDEPENDENT_AMBULATORY_CARE_PROVIDER_SITE_OTHER): Payer: Medicare Other | Admitting: Ophthalmology

## 2020-05-08 DIAGNOSIS — H35033 Hypertensive retinopathy, bilateral: Secondary | ICD-10-CM

## 2020-05-08 DIAGNOSIS — D3132 Benign neoplasm of left choroid: Secondary | ICD-10-CM

## 2020-05-08 DIAGNOSIS — H318 Other specified disorders of choroid: Secondary | ICD-10-CM

## 2020-05-08 DIAGNOSIS — H43813 Vitreous degeneration, bilateral: Secondary | ICD-10-CM

## 2020-05-08 DIAGNOSIS — H3562 Retinal hemorrhage, left eye: Secondary | ICD-10-CM

## 2020-05-08 DIAGNOSIS — I10 Essential (primary) hypertension: Secondary | ICD-10-CM | POA: Diagnosis not present

## 2020-05-24 ENCOUNTER — Other Ambulatory Visit: Payer: Self-pay | Admitting: Family

## 2020-05-24 MED ORDER — FUROSEMIDE 20 MG PO TABS: ORAL_TABLET | ORAL | 1 refills | Status: DC

## 2020-05-24 NOTE — Addendum Note (Signed)
Addended by: [name] on: 05/24/2020 10:29 AM   Modules accepted: Orders

## 2020-06-02 ENCOUNTER — Telehealth: Payer: Self-pay | Admitting: Psychiatry

## 2020-06-02 NOTE — Telephone Encounter (Signed)
Please review

## 2020-06-02 NOTE — Telephone Encounter (Signed)
Fairly recently discontinued sertraline 50 mg daily.  That is the best antipanic med.  Have him restart sertraline 25 mg daily for 7 days and then 50 mg daily.  Will take several weeks to help.  Hold off the Abilify bc that was for depression.

## 2020-06-02 NOTE — Telephone Encounter (Signed)
Pt called to report he had a panic attack last night and it scared him very badly. Asking if he needs to start meds again. Contact @ [phone]. Apt 12/13

## 2020-06-02 NOTE — Telephone Encounter (Signed)
Duplicate message from this morning. Already addressed.

## 2020-06-02 NOTE — Telephone Encounter (Signed)
Patient aware and has plenty of the Sertraline 50 mg at home. Will call back with further concerns.

## 2020-06-02 NOTE — Telephone Encounter (Signed)
Pt called and said that he had a panic attack yesterday. He wants to know if he can go back on his medications. Please give him a call at [phone]

## 2020-06-13 ENCOUNTER — Ambulatory Visit: Payer: Medicare Other

## 2020-06-13 ENCOUNTER — Telehealth: Payer: Self-pay | Admitting: Psychiatry

## 2020-06-13 NOTE — Telephone Encounter (Signed)
Please review

## 2020-06-13 NOTE — Telephone Encounter (Signed)
Pt LM on VM needing apt today to see CC. Returned call, Pt stated he is still having issues with anxiety and needs to see CC today. His follow up apt is 12/13.  Contact # [phone].  Advise if CC can work in today and I will schedule apt.

## 2020-06-16 ENCOUNTER — Encounter: Payer: Self-pay | Admitting: Psychiatry

## 2020-06-16 ENCOUNTER — Other Ambulatory Visit: Payer: Self-pay

## 2020-06-16 ENCOUNTER — Ambulatory Visit (INDEPENDENT_AMBULATORY_CARE_PROVIDER_SITE_OTHER): Payer: Medicare Other | Admitting: Psychiatry

## 2020-06-16 DIAGNOSIS — F411 Generalized anxiety disorder: Secondary | ICD-10-CM

## 2020-06-16 DIAGNOSIS — F4001 Agoraphobia with panic disorder: Secondary | ICD-10-CM | POA: Diagnosis not present

## 2020-06-16 DIAGNOSIS — F333 Major depressive disorder, recurrent, severe with psychotic symptoms: Secondary | ICD-10-CM | POA: Diagnosis not present

## 2020-06-16 DIAGNOSIS — N539 Unspecified male sexual dysfunction: Secondary | ICD-10-CM

## 2020-06-16 MED ORDER — SERTRALINE HCL 100 MG PO TABS: 50.0000 mg | ORAL_TABLET | Freq: Every day | ORAL | 0 refills | Status: DC

## 2020-06-16 MED ORDER — ARIPIPRAZOLE 10 MG PO TABS: 10.0000 mg | ORAL_TABLET | Freq: Every day | ORAL | 1 refills | Status: DC

## 2020-06-16 MED ORDER — ALPRAZOLAM 0.5 MG PO TABS: 0.5000 mg | ORAL_TABLET | Freq: Two times a day (BID) | ORAL | 0 refills | Status: DC | PRN

## 2020-06-16 NOTE — Patient Instructions (Signed)
Increase sertraline 100 mg daily Increase aripiprazole to 10 mg daily. Alprazolam 0.5 mg as needed for panic

## 2020-06-16 NOTE — Progress Notes (Signed)
Jeremy [name] [id number] 12-01-1955 64 y.o.     Subjective:   Patient ID:  Jeremy [name] is a 64 y.o. (DOB 1956/05/04) male.  Chief Complaint:  Chief Complaint  Patient presents with  . Follow-up  . Anxiety    relapse  . Depression  . Panic Attack    Depression        Associated symptoms include no decreased concentration and no suicidal ideas.  Past medical history includes anxiety.   Anxiety Symptoms include nervous/anxious behavior and palpitations. Patient reports no chest pain, confusion, decreased concentration or suicidal ideas.      Jeremy [name] presents  today for follow-up of anxiety and depression.   He had been out on disability due to his back since September at that time.  seen April 2020.  Since retirement he has been under less stress and had asked to reduce and hopefully eliminate some medications.  We reduced Abilify from 7.5 to 5 mg daily.  As of December 04, 2019 the following is noted: Still good. nOTHing worse with dose reduction.   Benefit from meds. Help depression, anxiety and anger.  They are all under control. No mood swings nor prolonged depression.  Anxiety OK.  Wants to try to reduce meds again if possible bc less stress as noted. Plan because of sexual side effects with sertraline he prefers to reduce sertraline to 50 mg daily and continue Abilify 5 mg daily.  02/21/2020 appointment with the following noted: Doing well still after reducing sertraline to 50 mg daily but no improvement in sexual function.  depression and anxiety  Are still under control. No panic.   Plan:  We discussed the options of trying to eliminate gradually the Abilify versus reducing the sertraline which is probably more risky.  However he is having sexual side effects from sertraline and no side effects from Abilify.  He prefers to stop the sertraline and see if he can get by without it. Therefore reduce sertraline to 25 mg daily for 2 weeks then stop it  05/07/20 appt  with the following noted: Off sertraline without more depression or anxiety.  No panic.  No withdrawal effects. Sexual function is not better.  Erection problems. Tried Viagra without help.  Saw urologist and had shots which helped then stopped helping. He wants to try stopping Abilify to see if sexual function will return. Plan: Since retiring his stress level has reduced significantly.  His depression and anxiety are under control.  He wants to try to reduce medications again.   He wants to try DC Abilify to see if sexual function is better despite risk relapse. He's been fine so far off sertraline.    06/02/2020 phone call from patient:Pt called and said that he had a panic attack yesterday. He wants to know if he can go back on his medications MD response: Fairly recently discontinued sertraline 50 mg daily.  That is the best antipanic med.  Have him restart sertraline 25 mg daily for 7 days and then 50 mg daily.  Will take several weeks to help.  Hold off the Abilify bc that was for depression. 06/13/20 TC: Pt LM on VM needing apt today to see CC. Returned call, Pt stated he is still having issues with anxiety and needs to see CC today. His follow up apt is 12/13.   2 panic attacks but close today.  06/16/2020 urgent appointment with the following noted: Seen with wife Panic and wanted to restart meds.  Wife says he's more depressed.  Only sleeps and eats and is like a walking zombie without meds. Lost father and stepmother and friend.  She thinks he's depressed.  B committed suicide years ago.   Now he thinks I'm going to leave him. Family notices his depression.  Having to help adopted father with cancer. He agrees with wife. Tense and SOB with anxiety. Wife thinks he represses things.  Mo left him in an orphanage and has abandonment issues even with her.  Thinking she will leave.  Has retired.  Has reduced his stress significantly.  Past history of psychiatric disability. Not sure  how many episodes of depression he's had in life.  Historically about the same levels of depression and anxiety   Past Psychiatric Medication Trials: Sertraline 100 Abilify 7.5 Xanax 0.5  Review of Systems:  Review of Systems  Cardiovascular: Positive for palpitations. Negative for chest pain.  Genitourinary:       ED persistent  Musculoskeletal: Positive for arthralgias and back pain.  Psychiatric/Behavioral: Positive for depression and dysphoric mood. Negative for agitation, behavioral problems, confusion, decreased concentration, hallucinations, self-injury, sleep disturbance and suicidal ideas. The patient is nervous/anxious. The patient is not hyperactive.     Medications: I have reviewed the patient's current medications.  Current Outpatient Medications  Medication Sig Dispense Refill  . albuterol (PROVENTIL HFA;VENTOLIN HFA) 108 (90 Base) MCG/ACT inhaler Inhale 2 puffs into the lungs every 6 (six) hours as needed for wheezing or shortness of breath. 1 Inhaler 2  . albuterol (PROVENTIL) (2.5 MG/3ML) 0.083% nebulizer solution Take 3 mLs (2.5 mg total) by nebulization every 6 (six) hours as needed for wheezing or shortness of breath. 150 mL 1  . amLODipine-benazepril (LOTREL) 10-40 MG capsule Take 1 capsule by mouth daily. 90 capsule 4  . b complex vitamins tablet Take 1 tablet by mouth daily.    . cyclobenzaprine (FLEXERIL) 10 MG tablet Take 1 tablet by mouth as directed.  0  . ferrous sulfate 325 (65 FE) MG tablet Take 1 tablet (325 mg total) by mouth 2 (two) times daily with a meal. 60 tablet 0  . fexofenadine (ALLEGRA) 180 MG tablet Take 1 tablet (180 mg total) by mouth daily. 30 tablet 0  . furosemide (LASIX) 20 MG tablet TAKE 1 TABLET(20 MG) BY MOUTH DAILY 90 tablet 1  . hydrochlorothiazide (HYDRODIURIL) 25 MG tablet Take 1 tablet (25 mg total) by mouth daily. 90 tablet 1  . meloxicam (MOBIC) 7.5 MG tablet Take 1 tablet (7.5 mg total) by mouth daily. 30 tablet 0  . metoprolol  succinate (TOPROL-XL) 50 MG 24 hr tablet TAKE 1 TABLET DAILY WITH OR IMMEDIATELY FOLLOWING A MEAL. 90 tablet 1  . Multiple Vitamin (MULTIVITAMIN WITH MINERALS) TABS tablet Take 1 tablet by mouth daily.    . Nebulizers (VIOS AEROSOL DELIVERY SYSTEM) MISC 1 each as directed.  0  . omeprazole (PRILOSEC) 40 MG capsule Take 1 capsule (40 mg total) by mouth daily. 90 capsule 0  . potassium chloride (KLOR-CON) 10 MEQ tablet Take 2 tablets (20 mEq total) by mouth daily. 180 tablet 1  . sertraline (ZOLOFT) 100 MG tablet Take 0.5 tablets (50 mg total) by mouth daily. 90 tablet 0  . ALPRAZolam (XANAX) 0.5 MG tablet Take 1 tablet (0.5 mg total) by mouth 2 (two) times daily as needed for anxiety (panic). 30 tablet 0  . ARIPiprazole (ABILIFY) 10 MG tablet Take 1 tablet (10 mg total) by mouth daily. 30 tablet 1   No  current facility-administered medications for this visit.    Medication Side Effects: ED with Zoloft.  Viagra failed.  Using shots.  Allergies: No Known Allergies  Past Medical History:  Diagnosis Date  . Anemia 11/16/2014  . Bladder disease    "an unusual bladder disease; don't know what it's called" (06/30/2015)  . Bulging lumbar disc   . GERD (gastroesophageal reflux disease)   . History of hiatal hernia   . Hyperglycemia 11/16/2014  . Hyperlipemia   . Hypertension   . Joint pain, knee   . Migraine    06/30/2015 "maybe 2-3 times/year; not as bad as I used to have them"  . OSA (obstructive sleep apnea)     Family History  Problem Relation Age of Onset  . Stroke Mother 23       Died of CVA  . Hypertension Mother   . Anesthesia problems Neg Hx   . Hypotension Neg Hx   . Malignant hyperthermia Neg Hx   . Pseudochol deficiency Neg Hx     Social History   Socioeconomic History  . Marital status: Married    Spouse name: Not on file  . Number of children: 3  . Years of education: Not on file  . Highest education level: Not on file  Occupational History  . Not on file   Tobacco Use  . Smoking status: Former Smoker    Packs/day: 1.50    Years: 6.00    Pack years: 9.00    Types: Cigarettes  . Smokeless tobacco: Never Used  . Tobacco comment: "stopped smoking in ~ 1990"  Substance and Sexual Activity  . Alcohol use: No    Alcohol/week: 0.0 standard drinks  . Drug use: No  . Sexual activity: Yes  Other Topics Concern  . 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 Difficulty of Paying Living Expenses: Not on file  Food Insecurity:   . Worried About Charity fundraiser in the Last Year: Not on file  . Ran Out of Food in the Last Year: Not on file  Transportation Needs:   . Lack of Transportation (Medical): Not on file  . Lack of Transportation (Non-Medical): Not on file  Physical Activity:   . Days of Exercise per Week: Not on file  . Minutes of Exercise per Session: Not on file  Stress:   . Feeling of Stress : Not on file  Social Connections:   . Frequency of Communication with Friends and Family: Not on file  . Frequency of Social Gatherings with Friends and Family: Not on file  . Attends Religious Services: Not on file  . Active Member of Clubs or Organizations: Not on file  . Attends Archivist Meetings: Not on file  . Marital Status: Not on file  Intimate Partner Violence:   . Fear of Current or Ex-Partner: Not on file  . Emotionally Abused: Not on file  . Physically Abused: Not on file  . Sexually Abused: Not on file    Past Medical History, Surgical history, Social history, and Family history were reviewed and updated as appropriate.   Please see review of systems for further details on the patient's review from today.   Objective:   Physical Exam:  There were no  vitals taken for this visit.  Physical Exam Constitutional:       General: He is not in acute distress.    Appearance: He is well-developed.  Musculoskeletal:        General: No deformity.  Neurological:     Mental Status: He is alert and oriented to person, place, and time.     Motor: No tremor.     Coordination: Coordination normal.     Gait: Gait normal.  Psychiatric:        Attention and Perception: Attention and perception normal.        Mood and Affect: Mood is anxious and depressed. Affect is blunt and flat. Affect is not labile, angry or inappropriate.        Speech: Speech normal.        Behavior: Behavior is slowed.        Thought Content: Thought content is paranoid. Thought content does not include homicidal or suicidal ideation. Thought content does not include homicidal or suicidal plan.        Cognition and Memory: Cognition normal.        Judgment: Judgment normal.     Comments: Insight intact. No auditory or visual hallucinations. No delusions.  ruminative     Lab Review:     Component Value Date/Time   NA 140 03/17/2020 1111   K 4.0 03/17/2020 1111   CL 102 03/17/2020 1111   CO2 32 03/17/2020 1111   GLUCOSE 107 (H) 03/17/2020 1111   BUN 12 03/17/2020 1111   CREATININE 0.91 03/17/2020 1111   CREATININE 1.05 01/13/2018 1542   CALCIUM 9.4 03/17/2020 1111   PROT 6.2 03/17/2020 1111   ALBUMIN 4.1 03/17/2020 1111   AST 21 03/17/2020 1111   ALT 32 03/17/2020 1111   ALKPHOS 66 03/17/2020 1111   BILITOT 0.6 03/17/2020 1111   GFRNONAA >60 10/25/2017 0438   GFRAA >60 10/25/2017 0438       Component Value Date/Time   WBC 6.3 03/17/2020 1111   RBC 4.53 03/17/2020 1111   HGB 14.2 03/17/2020 1111   HCT 41.5 03/17/2020 1111   PLT 234.0 03/17/2020 1111   MCV 91.6 03/17/2020 1111   MCH 30.0 10/25/2017 0438   MCHC 34.3 03/17/2020 1111   RDW 13.8 03/17/2020 1111   LYMPHSABS 1.9 03/17/2020 1111   MONOABS 0.6 03/17/2020 1111   EOSABS 0.1 03/17/2020 1111   BASOSABS 0.1 03/17/2020 1111    No results found for: POCLITH,  LITHIUM   No results found for: PHENYTOIN, PHENOBARB, VALPROATE, CBMZ   .res Assessment: Plan:    Severe recurrent major depression w/psychotic features, mood-congruent (Jeremy [name]) - Plan: ARIPiprazole (ABILIFY) 10 MG tablet, sertraline (ZOLOFT) 100 MG tablet  Panic disorder with agoraphobia - Plan: sertraline (ZOLOFT) 100 MG tablet, ALPRAZolam (XANAX) 0.5 MG tablet  Generalized anxiety disorder - Plan: sertraline (ZOLOFT) 100 MG tablet  Male sexual dysfunction Erectile 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 We discussed Jeremy [name] has a history of severe depression and panic disorder and anxiety that caused him to go out on disability in 14-Nov-2017.  He has been back to work and then had to come out of disability again due to back pain.  Since the last visit he has completely retired.    He also has a history of short-term disability in 11/15/06 after the death of his mother and brother.  He has had good  response to the combination of Zoloft and Abilify.  His panic attacks have stopped and his depression is under control.    Relapse worse off meds   Increase sertraline to 100 mg daily.  May need to go higher Increase Abilify to 10 mg daily for severe depression with paranoid rumination. OK prn Xanax prn panic  Discussed potential metabolic side effects associated with atypical antipsychotics, as well as potential risk for movement side effects. Advised pt to contact office if movement side effects occur.   Rec counseling as soon as available. Lanetta Inch.  Follow-up 6-8 weeks  Lynder Parents, MD, DFAPA  Please see After Visit Summary for patient specific instructions.  Future Appointments  Date Time Provider [place]  06/18/2020 10:30 AM [facility] NURSE [facility]  08/04/2020 10:30 AM Cottle, Billey Co., MD CP[facility]CP None  09/10/2020  1:30 PM Hayden Pedro, MD T[facility]TRE None  09/17/2020 10:00 AM  [name], NP [facility]    No orders of the defined types were placed in this encounter.     -------------------------------

## 2020-06-18 ENCOUNTER — Ambulatory Visit (INDEPENDENT_AMBULATORY_CARE_PROVIDER_SITE_OTHER): Payer: Medicare Other

## 2020-06-18 ENCOUNTER — Other Ambulatory Visit: Payer: Self-pay

## 2020-06-18 DIAGNOSIS — Z23 Encounter for immunization: Secondary | ICD-10-CM

## 2020-06-18 NOTE — Progress Notes (Signed)
Pt was given flu shot in left deltoid Pt tolerated well

## 2020-06-19 ENCOUNTER — Other Ambulatory Visit: Payer: Self-pay | Admitting: Family

## 2020-07-01 ENCOUNTER — Other Ambulatory Visit: Payer: Self-pay

## 2020-07-01 ENCOUNTER — Ambulatory Visit (INDEPENDENT_AMBULATORY_CARE_PROVIDER_SITE_OTHER): Payer: Medicare Other | Admitting: Mental Health

## 2020-07-01 DIAGNOSIS — F333 Major depressive disorder, recurrent, severe with psychotic symptoms: Secondary | ICD-10-CM

## 2020-07-01 DIAGNOSIS — F411 Generalized anxiety disorder: Secondary | ICD-10-CM | POA: Diagnosis not present

## 2020-07-01 DIAGNOSIS — F4001 Agoraphobia with panic disorder: Secondary | ICD-10-CM | POA: Diagnosis not present

## 2020-07-01 NOTE — Progress Notes (Signed)
[facility] Counsel[facility] Initial Adult Exam  Name: Jeremy [name] Date: 07/01/2020 MRN: [id number] DOB: 22-Apr-1956 PCP: [name], NP  Time spent: 53 minutes  Reason for Visit /Presenting Problem: Patient stated his father has been diagnosed w/ lung cancer a few months ago, currently in hospital w/ double pnemonia; he is age 65. Stepmother passed away in 05-22-23, also a good friend passed away around the same time. Reports coping w/ continued grief; his stepmother passed of cancer. He reports feeling depressed, rates it a 7/10 for the last months. Feels he has improved by taking his psychiatric medications again rx'd by Dr. Clovis Pu. He returned to care around May 22, 2023. Reports anxiety most days but decreasing due to his taking his medications again. Reports panic attack hx, last episode was in 05/22/2023, none since.  He stated recently to his wife "I fee like I'm not the man I used to be", worries his wife will leave him, she has reassured him she will not. He stated he has had sexual side effects due to his taking medications again. He was off his medications for about 1-2 months before starting back in 05-22-2023. Reports anhedonia, not playing golf anymore. He and his wife have been married 63 years.  He wants to enjoy activities again. He stated he has a hx of walking for exercise but has stopped.   Mental Status Exam:   Appearance:   Casual     Behavior:  Appropriate  Motor:  Normal  Speech/Language:   Clear and Coherent  Affect:  Constricted  Mood:  depressed  Thought process:  normal  Thought content:    WNL  Sensory/Perceptual disturbances:    none  Orientation:  x4   Attention:  Good  Concentration:  Good  Memory:  WNL  Fund of knowledge:   Good  Insight:    Good  Judgment:   Good  Impulse Control:  Good   Reported Symptoms:  Depressed, anxiety,   Risk Assessment: Danger to Self:  No Self-injurious Behavior: No Danger to Others: No Duty to Warn:no Physical  Aggression / Violence:No  Access to Firearms a concern: No  Gang Involvement:No  Patient / guardian was educated about steps to take if suicide or homicide risk level increases between visits: yes While future psychiatric events cannot be accurately predicted, the patient does not currently require acute inpatient psychiatric care and does not currently meet [facility] involuntary commitment criteria.  Substance Abuse History: Current substance abuse: No     Past Psychiatric History:   [facility]: [facility] History of Psych Hospitalization: No  Psychological Testing: none  Abuse History: Victim- none Report needed: No. Victim of Neglect:No. Perpetrator of none  Witness / Exposure to Domestic Violence: No   Protective Services Involvement: No  Witness to [employer] Violence:  No   Family History:  Family History  Problem Relation Age of Onset  . Stroke Mother 35       Died of CVA  . Hypertension Mother   . Anesthesia problems Neg Hx   . Hypotension Neg Hx   . Malignant hyperthermia Neg Hx   . Pseudochol deficiency Neg Hx     Living situation: the patient lives w/ his wife  Sexual Orientation:  heterosexual  Relationship Status: married x 47 years Name of spouse / other: [name]             If a parent, number of children / ages: 3 adult children- Josh 58, Amy 46, [place]; family,  friends  Financial Stress:  none  Income/Employment/Disability: retired  Armed forces logistics/support/administrative officer: none  Educational History: Education: [employer] History/Surgical History: Past Medical History:  Diagnosis Date  . Anemia 11/16/2014  . Bladder disease    "an unusual bladder disease; don't know what it's called" (06/30/2015)  . Bulging lumbar disc   . GERD (gastroesophageal reflux disease)   . History of hiatal hernia   . Hyperglycemia 11/16/2014  . Hyperlipemia   . Hypertension   . Joint pain, knee   . Migraine    06/30/2015 "maybe 2-3  times/year; not as bad as I used to have them"  . OSA (obstructive sleep apnea)     Past Surgical History:  Procedure Laterality Date  . ESOPHAGOGASTRODUODENOSCOPY    . ESOPHAGOGASTRODUODENOSCOPY (EGD) WITH ESOPHAGEAL DILATION  ~ 2014  . KNEE ARTHROSCOPY Right 1995  . NASAL SEPTUM SURGERY  ~ 1972  . TOTAL KNEE ARTHROPLASTY  10/11/2011   Procedure: TOTAL KNEE ARTHROPLASTY;  Surgeon: Rudean Haskell, MD;  Location: [facility];  Service: Orthopedics;  Laterality: Right;  . 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)  ~2014    Medications: Current Outpatient Medications  Medication Sig Dispense Refill  . albuterol (PROVENTIL HFA;VENTOLIN HFA) 108 (90 Base) MCG/ACT inhaler Inhale 2 puffs into the lungs every 6 (six) hours as needed for wheezing or shortness of breath. 1 Inhaler 2  . albuterol (PROVENTIL) (2.5 MG/3ML) 0.083% nebulizer solution Take 3 mLs (2.5 mg total) by nebulization every 6 (six) hours as needed for wheezing or shortness of breath. 150 mL 1  . ALPRAZolam (XANAX) 0.5 MG tablet Take 1 tablet (0.5 mg total) by mouth 2 (two) times daily as needed for anxiety (panic). 30 tablet 0  . amLODipine-benazepril (LOTREL) 10-40 MG capsule Take 1 capsule by mouth daily. 90 capsule 4  . ARIPiprazole (ABILIFY) 10 MG tablet Take 1 tablet (10 mg total) by mouth daily. 30 tablet 1  . b complex vitamins tablet Take 1 tablet by mouth daily.    . cyclobenzaprine (FLEXERIL) 10 MG tablet Take 1 tablet by mouth as directed.  0  . ferrous sulfate 325 (65 FE) MG tablet Take 1 tablet (325 mg total) by mouth 2 (two) times daily with a meal. 60 tablet 0  . fexofenadine (ALLEGRA) 180 MG tablet Take 1 tablet (180 mg total) by mouth daily. 30 tablet 0  . furosemide (LASIX) 20 MG tablet TAKE 1 TABLET(20 MG) BY MOUTH DAILY 90 tablet 1  . hydrochlorothiazide (HYDRODIURIL) 25 MG tablet Take 1 tablet (25 mg total) by mouth daily. 90 tablet 1  . meloxicam (MOBIC) 7.5 MG tablet Take 1 tablet (7.5 mg  total) by mouth daily. 30 tablet 0  . metoprolol succinate (TOPROL-XL) 50 MG 24 hr tablet TAKE 1 TABLET DAILY WITH OR IMMEDIATELY FOLLOWING A MEAL. 90 tablet 1  . Multiple Vitamin (MULTIVITAMIN WITH MINERALS) TABS tablet Take 1 tablet by mouth daily.    . Nebulizers (VIOS AEROSOL DELIVERY SYSTEM) MISC 1 each as directed.  0  . omeprazole (PRILOSEC) 40 MG capsule Take 1 capsule (40 mg total) by mouth daily. 90 capsule 0  . potassium chloride (KLOR-CON) 10 MEQ tablet TAKE 2 TABLETS(20 MEQ) BY MOUTH DAILY 180 tablet 1  . sertraline (ZOLOFT) 100 MG tablet Take 0.5 tablets (50 mg total) by mouth daily. 90 tablet 0   No current facility-administered medications for this visit.    No Known Allergies  Diagnoses:    ICD-10-CM   1.  Severe recurrent major depression w/psychotic features, mood-congruent (HCC)  F33.3   2. Generalized anxiety disorder  F41.1   3. Panic disorder with agoraphobia  F40.01     Plan of Care: TBD   [place], [facility]

## 2020-07-02 ENCOUNTER — Other Ambulatory Visit: Payer: Self-pay

## 2020-07-02 ENCOUNTER — Telehealth: Payer: Self-pay | Admitting: Family

## 2020-07-02 MED ORDER — AMLODIPINE BESY-BENAZEPRIL HCL 10-40 MG PO CAPS: 1.0000 | ORAL_CAPSULE | Freq: Every day | ORAL | 3 refills | Status: DC

## 2020-07-02 MED ORDER — HYDROCHLOROTHIAZIDE 25 MG PO TABS
25.0000 mg | ORAL_TABLET | Freq: Every day | ORAL | 1 refills | Status: DC
Start: 2020-07-02 — End: 2020-12-26

## 2020-07-02 NOTE — Telephone Encounter (Signed)
Medication:    hydrochlorothiazide (HYDRODIURIL) 25 MG tablet [[id number]]   amLODipine-benazepril (LOTREL) 10-40 MG capsule [[id number]]   Has the patient contacted their pharmacy?  (If no, request that the patient contact the pharmacy for the refill.) (If yes, when and what did the pharmacy advise?)     Preferred Pharmacy (with phone number or street name): [facility] [address], [address]  [place], [place] [address]  Phone:  [phone] Fax:  [phone]    Agent: Please be advised that RX refills may take up to 3 business days. We ask that you follow-up with your pharmacy.

## 2020-07-02 NOTE — Telephone Encounter (Signed)
Refills sent to patient's pharmacy.

## 2020-07-07 ENCOUNTER — Other Ambulatory Visit: Payer: Self-pay | Admitting: Family

## 2020-07-09 ENCOUNTER — Encounter: Payer: Self-pay | Admitting: Internal Medicine

## 2020-07-09 ENCOUNTER — Ambulatory Visit (INDEPENDENT_AMBULATORY_CARE_PROVIDER_SITE_OTHER): Payer: Medicare Other | Admitting: Internal Medicine

## 2020-07-09 ENCOUNTER — Other Ambulatory Visit: Payer: Self-pay

## 2020-07-09 VITALS — BP 114/73 | HR 72 | Temp 97.9°F | Resp 18 | Ht 67.0 in | Wt 244.1 lb

## 2020-07-09 DIAGNOSIS — L03319 Cellulitis of trunk, unspecified: Secondary | ICD-10-CM

## 2020-07-09 MED ORDER — KETOCONAZOLE 2 % EX CREA: 1.0000 "application " | TOPICAL_CREAM | Freq: Every day | CUTANEOUS | 0 refills | Status: DC

## 2020-07-09 MED ORDER — DOXYCYCLINE HYCLATE 100 MG PO TABS: 100.0000 mg | ORAL_TABLET | Freq: Two times a day (BID) | ORAL | 0 refills | Status: DC

## 2020-07-09 NOTE — Progress Notes (Signed)
Subjective:    [name], male    DOB: 08/22/1956, 64 y.o.   MRN: [id number]  DOS:  07/09/2020 Type of visit - description: Acute  The patient noted a lump at the right suprapubic area yesterday, it started to increase in size and hurt.   It was red. He has poked it twice and obtain some white material. Subsequently he noticed some redness around it.  He denies any fever chills. No penile discharge No scrotal swelling.   Review of Systems See above   Past Medical History:  Diagnosis Date  . Anemia 11/16/2014  . Bladder disease    "an unusual bladder disease; don't know what it's called" (06/30/2015)  . Bulging lumbar disc   . GERD (gastroesophageal reflux disease)   . History of hiatal hernia   . Hyperglycemia 11/16/2014  . Hyperlipemia   . Hypertension   . Joint pain, knee   . Migraine    06/30/2015 "maybe 2-3 times/year; not as bad as I used to have them"  . OSA (obstructive sleep apnea)     Past Surgical History:  Procedure Laterality Date  . ESOPHAGOGASTRODUODENOSCOPY    . ESOPHAGOGASTRODUODENOSCOPY (EGD) WITH ESOPHAGEAL DILATION  ~ 2014  . KNEE ARTHROSCOPY Right 1995  . NASAL SEPTUM SURGERY  ~ 1972  . TOTAL KNEE ARTHROPLASTY  10/11/2011   Procedure: TOTAL KNEE ARTHROPLASTY;  Surgeon: Rudean Haskell, MD;  Location: [facility];  Service: Orthopedics;  Laterality: Right;  . 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)  ~2014    Allergies as of 07/09/2020   No Known Allergies     Medication List       Accurate as of July 09, 2020 11:10 AM. If you have any questions, ask your nurse or doctor.        albuterol 108 (90 Base) MCG/ACT inhaler Commonly known as: VENTOLIN HFA Inhale 2 puffs into the lungs every 6 (six) hours as needed for wheezing or shortness of breath.   albuterol (2.5 MG/3ML) 0.083% nebulizer solution Commonly known as: PROVENTIL Take 3 mLs (2.5 mg total) by nebulization every 6 (six) hours as needed  for wheezing or shortness of breath.   ALPRAZolam 0.5 MG tablet Commonly known as: Xanax Take 1 tablet (0.5 mg total) by mouth 2 (two) times daily as needed for anxiety (panic).   amLODipine-benazepril 10-40 MG capsule Commonly known as: LOTREL Take 1 capsule by mouth daily.   ARIPiprazole 10 MG tablet Commonly known as: ABILIFY Take 1 tablet (10 mg total) by mouth daily.   b complex vitamins tablet Take 1 tablet by mouth daily.   cyclobenzaprine 10 MG tablet Commonly known as: FLEXERIL Take 1 tablet by mouth as directed.   ferrous sulfate 325 (65 FE) MG tablet Take 1 tablet (325 mg total) by mouth 2 (two) times daily with a meal.   fexofenadine 180 MG tablet Commonly known as: ALLEGRA Take 1 tablet (180 mg total) by mouth daily.   furosemide 20 MG tablet Commonly known as: LASIX TAKE 1 TABLET(20 MG) BY MOUTH DAILY   hydrochlorothiazide 25 MG tablet Commonly known as: HYDRODIURIL Take 1 tablet (25 mg total) by mouth daily.   meloxicam 7.5 MG tablet Commonly known as: MOBIC Take 1 tablet (7.5 mg total) by mouth daily.   metoprolol succinate 50 MG 24 hr tablet Commonly known as: TOPROL-XL Take 1 tablet (50 mg total) by mouth daily. Take with or immediately following a meal.   multivitamin with minerals  Tabs tablet Take 1 tablet by mouth daily.   omeprazole 40 MG capsule Commonly known as: PRILOSEC Take 1 capsule (40 mg total) by mouth daily.   potassium chloride 10 MEQ tablet Commonly known as: KLOR-CON TAKE 2 TABLETS(20 MEQ) BY MOUTH DAILY   sertraline 100 MG tablet Commonly known as: ZOLOFT Take 0.5 tablets (50 mg total) by mouth daily.   Vios Aerosol Delivery System Misc 1 each as directed.          Objective:   Physical Exam Genitourinary:     BP 114/73 (BP Location: Right Arm, Patient Position: Sitting, Cuff Size: Normal)   Pulse 72   Temp 97.9 F (36.6 C) (Oral)   Resp 18   Ht 5\' 7"  (1.702 m)   Wt 244 lb 2 oz (110.7 kg)   SpO2 97%    BMI 38.24 kg/m    General:   Well developed, NAD, BMI noted. HEENT:  Normocephalic . Face symmetric, atraumatic Lower extremities: no pretibial edema bilaterally  Skin: See picture in graphic. There is also some maceration at the right groin. Neurologic:  alert & oriented X3.  Speech normal, gait appropriate for age and unassisted Psych--  Cognition and judgment appear intact.  Cooperative with normal attention span and concentration.  Behavior appropriate. No anxious or depressed appearing.       Assessment     64 year old gentleman, history of CHF, HTN, OSA, anxiety depression, morbid obesity, on multiple medications, normal renal functions, no drug allergies presents with:  Suprapubic cellulitis and nondrainable abscess: He does not seem toxic and has no systemic symptoms. Recommend warm compresses, doxycycline and Nizoral for right groin fungal infection. Explained patient that he needs to seek medical attention if he is not promptly responding. I arrange for a follow-up in 2 days with PCP to be sure he is not getting worse before the weekend. He verbalized understanding     This visit occurred during the SARS-CoV-2 public health emergency.  Safety protocols were in place, including screening questions prior to the visit, additional usage of staff PPE, and extensive cleaning of exam room while observing appropriate contact time as indicated for disinfecting solutions.

## 2020-07-09 NOTE — Patient Instructions (Signed)
Start taking the antibiotic called doxycycline, 1 tablet ASAP, the next tablet at bedtime.  Apply the cream to the groins twice a day  You have an appointment to see Jeremy [name] on Friday at 2:40 PM.  Call or go to the ER if: Fever, chills, the redness is spreading despite antibiotics, you have nausea or vomiting or inflammation in the penis or testicles

## 2020-07-09 NOTE — Progress Notes (Signed)
Pre visit review using our clinic review tool, if applicable. No additional management support is needed unless otherwise documented below in the visit note. 

## 2020-07-11 ENCOUNTER — Encounter: Payer: Self-pay | Admitting: Family

## 2020-07-11 ENCOUNTER — Ambulatory Visit (INDEPENDENT_AMBULATORY_CARE_PROVIDER_SITE_OTHER): Payer: Medicare Other | Admitting: Family

## 2020-07-11 ENCOUNTER — Other Ambulatory Visit: Payer: Self-pay

## 2020-07-11 VITALS — BP 119/68 | HR 67 | Temp 97.8°F | Resp 16 | Ht 67.0 in | Wt 244.0 lb

## 2020-07-11 DIAGNOSIS — L0291 Cutaneous abscess, unspecified: Secondary | ICD-10-CM

## 2020-07-11 DIAGNOSIS — L0390 Cellulitis, unspecified: Secondary | ICD-10-CM | POA: Diagnosis not present

## 2020-07-11 NOTE — Progress Notes (Signed)
Subjective:    [name], male    DOB: Jun 30, 1956, 64 y.o.   MRN: [id number]  HPI  Patient is a 64 yr old male who presents today for follow up of his suprapubic cellulitis.  Symptoms started on 11/16.  Saw Dr. Larose Kells on 07/09/20 and was placed on doxycycline.  He thinks that the area may be less red. Reports good compliance with doxycycline.   Review of Systems See HPI  Past Medical History:  Diagnosis Date   Anemia 11/16/2014   Bladder disease    "an unusual bladder disease; don't know what it's called" (06/30/2015)   Bulging lumbar disc    GERD (gastroesophageal reflux disease)    History of hiatal hernia    Hyperglycemia 11/16/2014   Hyperlipemia    Hypertension    Joint pain, knee    Migraine    06/30/2015 "maybe 2-3 times/year; not as bad as I used to have them"   OSA (obstructive sleep apnea)      Social History   Socioeconomic History   Marital status: Married    Spouse name: Not on file   Number of children: 3   Years of education: Not on file   Highest education level: Not on file  Occupational History   Not on file  Tobacco Use   Smoking status: Former Smoker    Packs/day: 1.50    Years: 6.00    Pack years: 9.00    Types: Cigarettes   Smokeless tobacco: Never Used   Tobacco comment: "stopped smoking in ~ 1990"  Substance and Sexual Activity   Alcohol use: No    Alcohol/week: 0.0 standard drinks   Drug use: No   Sexual activity: Yes  Other Topics Concern   Not on file  Social History Narrative   3 children    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, research (life sciences))   Married   Completed 12th grade   Enjoys softball   Social Determinants of Radio broadcast assistant Strain:    Difficulty of Paying Living Expenses: Not on file  Food Insecurity:    Worried About Charity fundraiser in the Last Year: Not on file   [employer] of Food in the Last Year: Not on file  Transportation Needs:    Lack of Transportation  (Medical): Not on file   Lack of Transportation (Non-Medical): Not on file  Physical Activity:    Days of Exercise per Week: Not on file   Minutes of Exercise per Session: Not on file  Stress:    Feeling of Stress : Not on file  Social Connections:    Frequency of Communication with Friends and Family: Not on file   Frequency of Social Gatherings with Friends and Family: Not on file   Attends Religious Services: Not on file   Active Member of Clubs or Organizations: Not on file   Attends Archivist Meetings: Not on file   Marital Status: Not on file  Intimate Partner Violence:    Fear of Current or Ex-Partner: Not on file   Emotionally Abused: Not on file   Physically Abused: Not on file   Sexually Abused: Not on file    Past Surgical History:  Procedure Laterality Date   ESOPHAGOGASTRODUODENOSCOPY     ESOPHAGOGASTRODUODENOSCOPY (EGD) WITH ESOPHAGEAL DILATION  ~ 2014   KNEE ARTHROSCOPY Right 1995   NASAL SEPTUM SURGERY  ~ [place]  10/11/2011   Procedure:  TOTAL KNEE ARTHROPLASTY;  Surgeon: Rudean Haskell, MD;  Location: [facility];  Service: Orthopedics;  Laterality: Right;   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)  ~2014    Family History  Problem Relation Age of Onset   Stroke Mother 62       Died of CVA   Hypertension Mother    Anesthesia problems Neg Hx    Hypotension Neg Hx    Malignant hyperthermia Neg Hx    Pseudochol deficiency Neg Hx     No Known Allergies  Current Outpatient Medications on File Prior to Visit  Medication Sig Dispense Refill   albuterol (PROVENTIL HFA;VENTOLIN HFA) 108 (90 Base) MCG/ACT inhaler Inhale 2 puffs into the lungs every 6 (six) hours as needed for wheezing or shortness of breath. 1 Inhaler 2   albuterol (PROVENTIL) (2.5 MG/3ML) 0.083% nebulizer solution Take 3 mLs (2.5 mg total) by nebulization every 6 (six) hours as needed for wheezing or shortness of  breath. 150 mL 1   ALPRAZolam (XANAX) 0.5 MG tablet Take 1 tablet (0.5 mg total) by mouth 2 (two) times daily as needed for anxiety (panic). 30 tablet 0   amLODipine-benazepril (LOTREL) 10-40 MG capsule Take 1 capsule by mouth daily. 90 capsule 3   ARIPiprazole (ABILIFY) 10 MG tablet Take 1 tablet (10 mg total) by mouth daily. 30 tablet 1   b complex vitamins tablet Take 1 tablet by mouth daily.     cyclobenzaprine (FLEXERIL) 10 MG tablet Take 1 tablet by mouth as directed.  0   doxycycline (VIBRA-TABS) 100 MG tablet Take 1 tablet (100 mg total) by mouth 2 (two) times daily. 20 tablet 0   ferrous sulfate 325 (65 FE) MG tablet Take 1 tablet (325 mg total) by mouth 2 (two) times daily with a meal. 60 tablet 0   fexofenadine (ALLEGRA) 180 MG tablet Take 1 tablet (180 mg total) by mouth daily. 30 tablet 0   furosemide (LASIX) 20 MG tablet TAKE 1 TABLET(20 MG) BY MOUTH DAILY 90 tablet 1   hydrochlorothiazide (HYDRODIURIL) 25 MG tablet Take 1 tablet (25 mg total) by mouth daily. 90 tablet 1   ketoconazole (NIZORAL) 2 % cream Apply 1 application topically daily. 30 g 0   meloxicam (MOBIC) 7.5 MG tablet Take 1 tablet (7.5 mg total) by mouth daily. 30 tablet 0   metoprolol succinate (TOPROL-XL) 50 MG 24 hr tablet Take 1 tablet (50 mg total) by mouth daily. Take with or immediately following a meal. 90 tablet 1   Multiple Vitamin (MULTIVITAMIN WITH MINERALS) TABS tablet Take 1 tablet by mouth daily.     Nebulizers (VIOS AEROSOL DELIVERY SYSTEM) MISC 1 each as directed.  0   omeprazole (PRILOSEC) 40 MG capsule Take 1 capsule (40 mg total) by mouth daily. 90 capsule 0   potassium chloride (KLOR-CON) 10 MEQ tablet TAKE 2 TABLETS(20 MEQ) BY MOUTH DAILY 180 tablet 1   sertraline (ZOLOFT) 100 MG tablet Take 0.5 tablets (50 mg total) by mouth daily. 90 tablet 0   No current facility-administered medications on file prior to visit.    BP 119/68 (BP Location: Right Arm, Patient Position:  Sitting, Cuff Size: Large)    Pulse 67    Temp 97.8 F (36.6 C) (Temporal)    Resp 16    Ht 5\' 7"  (1.702 m)    Wt 244 lb (110.7 kg)    SpO2 96%    BMI 38.22 kg/m       Objective:  Physical Exam Constitutional:      Appearance: He is not ill-appearing.  HENT:     Head: Normocephalic and atraumatic.  Skin:    General: Skin is warm and dry.     Comments: + erythema noted suprapubic (right side >L side) + fluctuance on the right (approximately 1 inch in diameter) with surrounding induration  Neurological:     Mental Status: He is alert and oriented to person, place, and time.  Psychiatric:        Mood and Affect: Mood normal.        Behavior: Behavior normal.            Assessment & Plan:  Cellulitis/abscess or suprapubic region- improvement in the redness on the left when compared to 11/17 picture, however there area on the right is more red and indurated but has now become fluctuant. .Procedure including risks/benefits explained to patient.  Questions were answered. After informed consent was obtained and a time out completed, site was cleansed with betadine x 2. 1% Lidocaine with epinephrine was infiltrated into the abscess and then using sterile technique incision and drainage was performed with scalpel. Copious amount of grey, foul smelling purulence was expressed.  Pt tolerated procedure well. Culture was obtained and will be sent for evaluation.  Chaperone was present for today's procedure.  Pt is advised as follows:  Please continue doxycycline twice daily. Apply warm compresses to the affected area twice daily to promote drainage. Follow up on 11/22.   This visit occurred during the SARS-CoV-2 public health emergency.  Safety protocols were in place, including screening questions prior to the visit, additional usage of staff PPE, and extensive cleaning of exam room while observing appropriate contact time as indicated for disinfecting solutions.

## 2020-07-11 NOTE — Patient Instructions (Signed)
Please continue doxycycline twice daily. Apply warm compresses to the affected area twice daily to promote drainage.

## 2020-07-14 ENCOUNTER — Encounter: Payer: Self-pay | Admitting: Family

## 2020-07-14 ENCOUNTER — Ambulatory Visit: Payer: Medicare Other | Admitting: Family

## 2020-07-14 ENCOUNTER — Other Ambulatory Visit: Payer: Self-pay

## 2020-07-14 VITALS — BP 127/68 | HR 61 | Temp 97.8°F | Resp 16 | Ht 67.0 in | Wt 242.0 lb

## 2020-07-14 DIAGNOSIS — L0291 Cutaneous abscess, unspecified: Secondary | ICD-10-CM

## 2020-07-14 DIAGNOSIS — L03314 Cellulitis of groin: Secondary | ICD-10-CM

## 2020-07-14 LAB — WOUND CULTURE
MICRO NUMBER:: 11226731
SPECIMEN QUALITY:: ADEQUATE

## 2020-07-14 MED ORDER — DOXYCYCLINE HYCLATE 100 MG PO TABS: 100.0000 mg | ORAL_TABLET | Freq: Two times a day (BID) | ORAL | 0 refills | Status: DC

## 2020-07-14 NOTE — Patient Instructions (Addendum)
Please continue doxycycline.  Continue warm compresses twice daily.  Call if pain/redness does not continue to improve.

## 2020-07-14 NOTE — Progress Notes (Signed)
Subjective:    [name], male    DOB: [birth year], 64 y.o.   MRN: [id number]  HPI   Patient is a 64 yr old male who presents today for follow up of his abscess following I and D on 07/11/20.  He reports that the area is improving but still painful.  He states that he continues warm compresses bid but the area has recently stopped draining.      Review of Systems See HPI  Past Medical History:  Diagnosis Date  . Anemia 11/16/2014  . Bladder disease    "an unusual bladder disease; don't know what it's called" (06/30/2015)  . Bulging lumbar disc   . GERD (gastroesophageal reflux disease)   . History of hiatal hernia   . Hyperglycemia 11/16/2014  . Hyperlipemia   . Hypertension   . Joint pain, knee   . Migraine    06/30/2015 "maybe 2-3 times/year; not as bad as I used to have them"  . OSA (obstructive sleep apnea)      Social History   Socioeconomic History  . Marital status: Married    Spouse name: Not on file  . Number of children: 3  . Years of education: Not on file  . Highest education level: Not on file  Occupational History  . Not on file  Tobacco Use  . Smoking status: Former Smoker    Packs/day: 1.50    Years: 6.00    Pack years: 9.00    Types: Cigarettes  . Smokeless tobacco: Never Used  . Tobacco comment: "stopped smoking in ~ 1990"  Substance and Sexual Activity  . Alcohol use: No    Alcohol/week: 0.0 standard drinks  . Drug use: No  . Sexual activity: Yes  Other Topics Concern  . 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 Difficulty of Paying Living Expenses: Not on file  Food Insecurity:   . Worried About Charity fundraiser in the Last Year: Not on file  . Ran Out of Food in the Last Year: Not on file  Transportation Needs:   . Lack of  Transportation (Medical): Not on file  . Lack of Transportation (Non-Medical): Not on file  Physical Activity:   . Days of Exercise per Week: Not on file  . Minutes of Exercise per Session: Not on file  Stress:   . Feeling of Stress : Not on file  Social Connections:   . Frequency of Communication with Friends and Family: Not on file  . Frequency of Social Gatherings with Friends and Family: Not on file  . Attends Religious Services: Not on file  . Active Member of Clubs or Organizations: Not on file  . Attends Archivist Meetings: Not on file  . Marital Status: Not on file  Intimate Partner Violence:   . Fear of Current or Ex-Partner: Not on file  . Emotionally Abused: Not on file  . Physically Abused: Not on file  . Sexually Abused: Not on file    Past Surgical History:  Procedure Laterality Date  . ESOPHAGOGASTRODUODENOSCOPY    . ESOPHAGOGASTRODUODENOSCOPY (EGD) WITH ESOPHAGEAL DILATION  ~ 2014  . KNEE ARTHROSCOPY Right 1995  . NASAL SEPTUM SURGERY  ~ 1972  . TOTAL KNEE  ARTHROPLASTY  10/11/2011   Procedure: TOTAL KNEE ARTHROPLASTY;  Surgeon: Rudean Haskell, MD;  Location: [facility];  Service: Orthopedics;  Laterality: Right;  . 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 Stroke Mother 65       Died of CVA  . Hypertension Mother   . Anesthesia problems Neg Hx   . Hypotension Neg Hx   . Malignant hyperthermia Neg Hx   . Pseudochol deficiency Neg Hx     No Known Allergies  Current Outpatient Medications on File Prior to Visit  Medication Sig Dispense Refill  . albuterol (PROVENTIL HFA;VENTOLIN HFA) 108 (90 Base) MCG/ACT inhaler Inhale 2 puffs into the lungs every 6 (six) hours as needed for wheezing or shortness of breath. 1 Inhaler 2  . albuterol (PROVENTIL) (2.5 MG/3ML) 0.083% nebulizer solution Take 3 mLs (2.5 mg total) by nebulization every 6 (six) hours as needed for wheezing or  shortness of breath. 150 mL 1  . ALPRAZolam (XANAX) 0.5 MG tablet Take 1 tablet (0.5 mg total) by mouth 2 (two) times daily as needed for anxiety (panic). 30 tablet 0  . amLODipine-benazepril (LOTREL) 10-40 MG capsule Take 1 capsule by mouth daily. 90 capsule 3  . ARIPiprazole (ABILIFY) 10 MG tablet Take 1 tablet (10 mg total) by mouth daily. 30 tablet 1  . b complex vitamins tablet Take 1 tablet by mouth daily.    . cyclobenzaprine (FLEXERIL) 10 MG tablet Take 1 tablet by mouth as directed.  0  . ferrous sulfate 325 (65 FE) MG tablet Take 1 tablet (325 mg total) by mouth 2 (two) times daily with a meal. 60 tablet 0  . fexofenadine (ALLEGRA) 180 MG tablet Take 1 tablet (180 mg total) by mouth daily. 30 tablet 0  . furosemide (LASIX) 20 MG tablet TAKE 1 TABLET(20 MG) BY MOUTH DAILY 90 tablet 1  . hydrochlorothiazide (HYDRODIURIL) 25 MG tablet Take 1 tablet (25 mg total) by mouth daily. 90 tablet 1  . ketoconazole (NIZORAL) 2 % cream Apply 1 application topically daily. 30 g 0  . meloxicam (MOBIC) 7.5 MG tablet Take 1 tablet (7.5 mg total) by mouth daily. 30 tablet 0  . metoprolol succinate (TOPROL-XL) 50 MG 24 hr tablet Take 1 tablet (50 mg total) by mouth daily. Take with or immediately following a meal. 90 tablet 1  . Multiple Vitamin (MULTIVITAMIN WITH MINERALS) TABS tablet Take 1 tablet by mouth daily.    . Nebulizers (VIOS AEROSOL DELIVERY SYSTEM) MISC 1 each as directed.  0  . omeprazole (PRILOSEC) 40 MG capsule Take 1 capsule (40 mg total) by mouth daily. 90 capsule 0  . potassium chloride (KLOR-CON) 10 MEQ tablet TAKE 2 TABLETS(20 MEQ) BY MOUTH DAILY 180 tablet 1  . sertraline (ZOLOFT) 100 MG tablet Take 0.5 tablets (50 mg total) by mouth daily. 90 tablet 0   No current facility-administered medications on file prior to visit.    BP 127/68 (BP Location: Right Arm, Patient Position: Sitting, Cuff Size: Large)   Pulse 61   Temp 97.8 F (36.6 C) (Temporal)   Resp 16   Ht 5\' 7"  (1.702  m)   Wt 242 lb (109.8 kg)   SpO2 97%   BMI 37.90 kg/m       Objective:   Physical Exam Constitutional:      Appearance: Normal appearance.  Skin:    General: Skin is warm and dry.  Comments: Significant reduction  in the erythema.  Affected area is significantly smaller.  Pea sized area of fluctuance noted in the middle with surrounding induration.   Neurological:     Mental Status: He is alert.           Assessment & Plan:  Abscess/cellulitis- significantly improving. Culture results pending.  Pt is advised as follows:  Please continue doxycycline until you are seen back in the office. Continue warm compresses twice daily.  Call if pain/redness does not continue to improve.  Follow up in 1 week.  Pt verbalizes understanding.  This visit occurred during the SARS-CoV-2 public health emergency.  Safety protocols were in place, including screening questions prior to the visit, additional usage of staff PPE, and extensive cleaning of exam room while observing appropriate contact time as indicated for disinfecting solutions.

## 2020-07-15 ENCOUNTER — Telehealth: Payer: Self-pay | Admitting: Mental Health

## 2020-07-15 NOTE — Telephone Encounter (Signed)
I thought I said yes to this last week ... ?

## 2020-07-15 NOTE — Telephone Encounter (Signed)
This patient has been seen once by Lanetta Inch, however, he has Medicare and cannot see Gerald Stabs. This was an oversight by our staff. I want to check with Burna Mortimer to see if he would be willing to take patient and when we could schedule him. Burna Mortimer. Can take Medicare. Please reply back to me. Thanks.

## 2020-07-16 ENCOUNTER — Other Ambulatory Visit: Payer: Self-pay

## 2020-07-16 ENCOUNTER — Telehealth: Payer: Self-pay | Admitting: Family

## 2020-07-16 ENCOUNTER — Other Ambulatory Visit: Payer: Self-pay | Admitting: Internal Medicine

## 2020-07-16 MED ORDER — DOXYCYCLINE HYCLATE 100 MG PO TABS
100.0000 mg | ORAL_TABLET | Freq: Two times a day (BID) | ORAL | 0 refills | Status: DC
Start: 2020-07-16 — End: 2020-07-21

## 2020-07-16 MED FILL — DOXYCYCLINE HYCLATE 100 MG: 100 | 3 days supply | Qty: 6 | Fill #0

## 2020-07-16 NOTE — Telephone Encounter (Signed)
Sent rx downstairs to [facility] per patient request. States he will pick it up here. Will pay OOP if still denied.

## 2020-07-16 NOTE — Telephone Encounter (Signed)
Please advise on alternative medication-Insurance denied doxy. PCP out of office.

## 2020-07-16 NOTE — Telephone Encounter (Signed)
The  prescription was sent as a generic to my knowledge. Advise patient 2 options: Pay out of pocket, hopefully he will be able to afford it. Another option is send generic doxycycline to our pharmacy downstairs and patient to pick it up today.

## 2020-07-16 NOTE — Telephone Encounter (Signed)
Patient is checking the status of medication

## 2020-07-16 NOTE — Telephone Encounter (Signed)
Kaylyn have you gotten PA or are you able to start one for patient?

## 2020-07-16 NOTE — Telephone Encounter (Signed)
According to [employer] deny doxycycline. Patient is unsure what is going on

## 2020-07-16 NOTE — Telephone Encounter (Signed)
No I have not seen PA for doxycycline- most of the time its not needed for this medication.

## 2020-07-21 ENCOUNTER — Ambulatory Visit: Payer: Medicare Other | Admitting: Internal Medicine

## 2020-07-21 ENCOUNTER — Ambulatory Visit: Payer: Medicare Other | Admitting: Family

## 2020-07-21 ENCOUNTER — Other Ambulatory Visit: Payer: Self-pay

## 2020-07-21 VITALS — BP 129/84 | HR 54 | Temp 98.1°F | Ht 67.0 in | Wt 245.0 lb

## 2020-07-21 DIAGNOSIS — L03314 Cellulitis of groin: Secondary | ICD-10-CM

## 2020-07-21 MED ORDER — CEPHALEXIN 500 MG PO CAPS: 500.0000 mg | ORAL_CAPSULE | Freq: Four times a day (QID) | ORAL | 0 refills | Status: DC

## 2020-07-21 NOTE — Progress Notes (Signed)
Subjective:    [name], male    DOB: July 14, 1956, 64 y.o.   MRN: [id number]  DOS:  07/21/2020 Type of visit - description: Follow-up Patient was seen by me 07/09/2020 with suprapubic cellulitis and a nondrainable abscess, started antibiotics, was seen 07/14/2020 by PCP,   noted significant reduction of the erythema and a pea-sized fluctuant area, it was lanceted .  Since the last visit the  Redness size and pain have decreased but not completely resolved.  No fever chills No discharge noted   Review of Systems See above   Past Medical History:  Diagnosis Date   Anemia 11/16/2014   Bladder disease    "an unusual bladder disease; don't know what it's called" (06/30/2015)   Bulging lumbar disc    GERD (gastroesophageal reflux disease)    History of hiatal hernia    Hyperglycemia 11/16/2014   Hyperlipemia    Hypertension    Joint pain, knee    Migraine    06/30/2015 "maybe 2-3 times/year; not as bad as I used to have them"   OSA (obstructive sleep apnea)     Past Surgical History:  Procedure Laterality Date   ESOPHAGOGASTRODUODENOSCOPY     ESOPHAGOGASTRODUODENOSCOPY (EGD) WITH ESOPHAGEAL DILATION  ~ 2014   KNEE ARTHROSCOPY Right 1995   NASAL SEPTUM SURGERY  ~ 1972   TOTAL KNEE ARTHROPLASTY  10/11/2011   Procedure: TOTAL KNEE ARTHROPLASTY;  Surgeon: Rudean Haskell, MD;  Location: [place];  Service: Orthopedics;  Laterality: Right;   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)  ~2014    Allergies as of 07/21/2020   No Known Allergies     Medication List       Accurate as of July 21, 2020 11:59 PM. If you have any questions, ask your nurse or doctor.        STOP taking these medications   doxycycline 100 MG tablet Commonly known as: VIBRA-TABS Stopped by: Kathlene November, MD     TAKE these medications   albuterol 108 (90 Base) MCG/ACT inhaler Commonly known as: VENTOLIN HFA Inhale 2 puffs into the lungs every 6  (six) hours as needed for wheezing or shortness of breath.   albuterol (2.5 MG/3ML) 0.083% nebulizer solution Commonly known as: PROVENTIL Take 3 mLs (2.5 mg total) by nebulization every 6 (six) hours as needed for wheezing or shortness of breath.   ALPRAZolam 0.5 MG tablet Commonly known as: Xanax Take 1 tablet (0.5 mg total) by mouth 2 (two) times daily as needed for anxiety (panic).   amLODipine-benazepril 10-40 MG capsule Commonly known as: LOTREL Take 1 capsule by mouth daily.   ARIPiprazole 10 MG tablet Commonly known as: ABILIFY Take 1 tablet (10 mg total) by mouth daily.   b complex vitamins tablet Take 1 tablet by mouth daily.   cephALEXin 500 MG capsule Commonly known as: KEFLEX Take 1 capsule (500 mg total) by mouth 4 (four) times daily. Started by: Kathlene November, MD   cyclobenzaprine 10 MG tablet Commonly known as: FLEXERIL Take 1 tablet by mouth as directed.   ferrous sulfate 325 (65 FE) MG tablet Take 1 tablet (325 mg total) by mouth 2 (two) times daily with a meal.   fexofenadine 180 MG tablet Commonly known as: ALLEGRA Take 1 tablet (180 mg total) by mouth daily.   furosemide 20 MG tablet Commonly known as: LASIX TAKE 1 TABLET(20 MG) BY MOUTH DAILY   hydrochlorothiazide 25 MG tablet Commonly known as: HYDRODIURIL  Take 1 tablet (25 mg total) by mouth daily.   ketoconazole 2 % cream Commonly known as: NIZORAL Apply 1 application topically daily.   meloxicam 7.5 MG tablet Commonly known as: MOBIC Take 1 tablet (7.5 mg total) by mouth daily.   metoprolol succinate 50 MG 24 hr tablet Commonly known as: TOPROL-XL Take 1 tablet (50 mg total) by mouth daily. Take with or immediately following a meal.   multivitamin with minerals Tabs tablet Take 1 tablet by mouth daily.   omeprazole 40 MG capsule Commonly known as: PRILOSEC Take 1 capsule (40 mg total) by mouth daily.   potassium chloride 10 MEQ tablet Commonly known as: KLOR-CON TAKE 2 TABLETS(20  MEQ) BY MOUTH DAILY   sertraline 100 MG tablet Commonly known as: ZOLOFT Take 0.5 tablets (50 mg total) by mouth daily.   Vios Aerosol Delivery System Misc 1 each as directed.          Objective:   Physical Exam Genitourinary:     BP 129/84 (BP Location: Left Arm, Patient Position: Sitting, Cuff Size: Large)    Pulse (!) 54    Temp 98.1 F (36.7 C) (Oral)    Ht 5\' 7"  (1.702 m)    Wt 245 lb (111.1 kg)    SpO2 94%    BMI 38.37 kg/m  General:   Well developed, NAD, BMI noted. HEENT:  Normocephalic . Face symmetric, atraumatic  Skin: See graphic Neurologic:  alert & oriented X3.  Speech normal, gait appropriate for age and unassisted Psych--  Cognition and judgment appear intact.  Cooperative with normal attention span and concentration.  Behavior appropriate. No anxious or depressed appearing.        Assessment      64 year old gentleman, history of CHF, HTN, OSA, anxiety depression, morbid obesity, on multiple medications, normal renal functions, no drug allergies presents with:  Suprapubic cellulitis and nondrainable abscess: Please see HPI for details. The patient has cellulitis, at the second visit PCP was able to drain a small amount of pus. He continues to clinically improved, still has area of induration. Already had 13 days of doxycycline. Plan: Keflex for 5 days, no further antibiotic after that, if induration does not gradually decrease in the next 3 weeks or if symptoms get worse he will call. Continue using the antifungal cream although the rash looks better Also recommend probiotics. See AVS    This visit occurred during the SARS-CoV-2 public health emergency.  Safety protocols were in place, including screening questions prior to the visit, additional usage of staff PPE, and extensive cleaning of exam room while observing appropriate contact time as indicated for disinfecting solutions.

## 2020-07-21 NOTE — Patient Instructions (Signed)
Start cephalexin for 5 days.  Continue applying a warm compress to the area  If the hard area does not decrease gradually over the next 3 weeks let us know.  If you have redness, pain, getting worse: Also let us know.  Take a probiotic over-the-counter such as Align daily.

## 2020-07-30 ENCOUNTER — Ambulatory Visit: Payer: Medicare Other | Admitting: Mental Health

## 2020-08-04 ENCOUNTER — Encounter: Payer: Self-pay | Admitting: Psychiatry

## 2020-08-04 ENCOUNTER — Ambulatory Visit (INDEPENDENT_AMBULATORY_CARE_PROVIDER_SITE_OTHER): Payer: Medicare Other | Admitting: Psychiatry

## 2020-08-04 ENCOUNTER — Other Ambulatory Visit: Payer: Self-pay

## 2020-08-04 DIAGNOSIS — F333 Major depressive disorder, recurrent, severe with psychotic symptoms: Secondary | ICD-10-CM | POA: Diagnosis not present

## 2020-08-04 DIAGNOSIS — F411 Generalized anxiety disorder: Secondary | ICD-10-CM | POA: Diagnosis not present

## 2020-08-04 DIAGNOSIS — F4001 Agoraphobia with panic disorder: Secondary | ICD-10-CM

## 2020-08-04 MED ORDER — SERTRALINE HCL 100 MG PO TABS: 100.0000 mg | ORAL_TABLET | Freq: Every day | ORAL | 0 refills | Status: DC

## 2020-08-04 NOTE — Progress Notes (Signed)
Jeremy [name] [id number] 1955/12/24 64 y.o.     Subjective:   Patient ID:  Jeremy [name] is a 63 y.o. (DOB 10/20/1955) male.  Chief Complaint:  Chief Complaint  Patient presents with  . Follow-up  . Depression  . Anxiety    Depression        Associated symptoms include no decreased concentration and no suicidal ideas.  Past medical history includes anxiety.   Anxiety Patient reports no chest pain, confusion, decreased concentration, nervous/anxious behavior, palpitations or suicidal ideas.      Jeremy [name] presents  today for follow-up of anxiety and depression.   He had been out on disability due to his back since September at that time.  seen April 2020.  Since retirement he has been under less stress and had asked to reduce and hopefully eliminate some medications.  We reduced Abilify from 7.5 to 5 mg daily.  As of December 04, 2019 the following is noted: Still good. nOTHing worse with dose reduction.   Benefit from meds. Help depression, anxiety and anger.  They are all under control. No mood swings nor prolonged depression.  Anxiety OK.  Wants to try to reduce meds again if possible bc less stress as noted. Plan because of sexual side effects with sertraline he prefers to reduce sertraline to 50 mg daily and continue Abilify 5 mg daily.  02/21/2020 appointment with the following noted: Doing well still after reducing sertraline to 50 mg daily but no improvement in sexual function.  depression and anxiety  Are still under control. No panic.   Plan:  We discussed the options of trying to eliminate gradually the Abilify versus reducing the sertraline which is probably more risky.  However he is having sexual side effects from sertraline and no side effects from Abilify.  He prefers to stop the sertraline and see if he can get by without it. Therefore reduce sertraline to 25 mg daily for 2 weeks then stop it  05/07/20 appt with the following noted: Off sertraline without  more depression or anxiety.  No panic.  No withdrawal effects. Sexual function is not better.  Erection problems. Tried Viagra without help.  Saw urologist and had shots which helped then stopped helping. He wants to try stopping Abilify to see if sexual function will return. Plan: Since retiring his stress level has reduced significantly.  His depression and anxiety are under control.  He wants to try to reduce medications again.   He wants to try DC Abilify to see if sexual function is better despite risk relapse. He's been fine so far off sertraline.    06/02/2020 phone call from patient:Pt called and said that he had a panic attack yesterday. He wants to know if he can go back on his medications MD response: Fairly recently discontinued sertraline 50 mg daily.  That is the best antipanic med.  Have him restart sertraline 25 mg daily for 7 days and then 50 mg daily.  Will take several weeks to help.  Hold off the Abilify bc that was for depression. 06/13/20 TC: Pt LM on VM needing apt today to see CC. Returned call, Pt stated he is still having issues with anxiety and needs to see CC today. His follow up apt is 12/13.   2 panic attacks but close today.  06/16/2020 urgent appointment with the following noted: Seen with wife Panic and wanted to restart meds.  Wife says he's more depressed.  Only sleeps and eats and  is like a walking zombie without meds. Lost father and stepmother and friend.  She thinks he's depressed.  B committed suicide years ago.   Now he thinks I'm going to leave him. Family notices his depression.  Having to help adopted father with cancer. He agrees with wife. Tense and SOB with anxiety. Wife thinks he represses things.  Mo left him in an orphanage and has abandonment issues even with her.  Thinking she will leave. Plan: Relapse worse off meds  Increase sertraline to 100 mg daily.  May need to go higher Increase Abilify to 10 mg daily for severe depression with  paranoid rumination. OK prn Xanax prn panic   08/04/20 appt with following noted:  Seen with wife, Jeremy [name] Better with tension.  Feels less down too.   SE increased appetite.  But had recently lost 20 # and lately snacking more. Wife sees dramatic turnaround and he's more talkative and engaged and friends notice too.  It's working. No Xanax used.  Staying awake during day.   Not quite back to normal but really improved per wife.     Has retired.  Has reduced his stress significantly.  Past history of psychiatric disability. Not sure how many episodes of depression he's had in life.  Historically about the same levels of depression and anxiety   Past Psychiatric Medication Trials: Sertraline 100 Abilify 7.5 Xanax 0.5  Review of Systems:  Review of Systems  Cardiovascular: Negative for chest pain and palpitations.  Genitourinary:       ED persistent  Musculoskeletal: Positive for arthralgias and back pain.  Psychiatric/Behavioral: Positive for depression and dysphoric mood. Negative for agitation, behavioral problems, confusion, decreased concentration, hallucinations, self-injury, sleep disturbance and suicidal ideas. The patient is not nervous/anxious and is not hyperactive.     Medications: I have reviewed the patient's current medications.  Current Outpatient Medications  Medication Sig Dispense Refill  . albuterol (PROVENTIL HFA;VENTOLIN HFA) 108 (90 Base) MCG/ACT inhaler Inhale 2 puffs into the lungs every 6 (six) hours as needed for wheezing or shortness of breath. 1 Inhaler 2  . albuterol (PROVENTIL) (2.5 MG/3ML) 0.083% nebulizer solution Take 3 mLs (2.5 mg total) by nebulization every 6 (six) hours as needed for wheezing or shortness of breath. 150 mL 1  . ALPRAZolam (XANAX) 0.5 MG tablet Take 1 tablet (0.5 mg total) by mouth 2 (two) times daily as needed for anxiety (panic). 30 tablet 0  . amLODipine-benazepril (LOTREL) 10-40 MG capsule Take 1 capsule by mouth daily. 90  capsule 3  . ARIPiprazole (ABILIFY) 10 MG tablet Take 1 tablet (10 mg total) by mouth daily. 30 tablet 1  . b complex vitamins tablet Take 1 tablet by mouth daily.    . cephALEXin (KEFLEX) 500 MG capsule Take 1 capsule (500 mg total) by mouth 4 (four) times daily. 20 capsule 0  . cyclobenzaprine (FLEXERIL) 10 MG tablet Take 1 tablet by mouth as directed.  0  . ferrous sulfate 325 (65 FE) MG tablet Take 1 tablet (325 mg total) by mouth 2 (two) times daily with a meal. 60 tablet 0  . fexofenadine (ALLEGRA) 180 MG tablet Take 1 tablet (180 mg total) by mouth daily. 30 tablet 0  . furosemide (LASIX) 20 MG tablet TAKE 1 TABLET(20 MG) BY MOUTH DAILY 90 tablet 1  . hydrochlorothiazide (HYDRODIURIL) 25 MG tablet Take 1 tablet (25 mg total) by mouth daily. 90 tablet 1  . ketoconazole (NIZORAL) 2 % cream Apply 1 application topically daily. [place]  g 0  . meloxicam (MOBIC) 7.5 MG tablet Take 1 tablet (7.5 mg total) by mouth daily. 30 tablet 0  . metoprolol succinate (TOPROL-XL) 50 MG 24 hr tablet Take 1 tablet (50 mg total) by mouth daily. Take with or immediately following a meal. 90 tablet 1  . Multiple Vitamin (MULTIVITAMIN WITH MINERALS) TABS tablet Take 1 tablet by mouth daily.    . Nebulizers (VIOS AEROSOL DELIVERY SYSTEM) MISC 1 each as directed.  0  . omeprazole (PRILOSEC) 40 MG capsule Take 1 capsule (40 mg total) by mouth daily. 90 capsule 0  . potassium chloride (KLOR-CON) 10 MEQ tablet TAKE 2 TABLETS(20 MEQ) BY MOUTH DAILY 180 tablet 1  . sertraline (ZOLOFT) 100 MG tablet Take 1 tablet (100 mg total) by mouth daily. 90 tablet 0   No current facility-administered medications for this visit.    Medication Side Effects: ED with Zoloft.  Viagra failed.  Using shots.  Allergies: No Known Allergies  Past Medical History:  Diagnosis Date  . Anemia 11/16/2014  . Bladder disease    "an unusual bladder disease; don't know what it's called" (06/30/2015)  . Bulging lumbar disc   . GERD  (gastroesophageal reflux disease)   . History of hiatal hernia   . Hyperglycemia 11/16/2014  . Hyperlipemia   . Hypertension   . Joint pain, knee   . Migraine    06/30/2015 "maybe 2-3 times/year; not as bad as I used to have them"  . OSA (obstructive sleep apnea)     Family History  Problem Relation Age of Onset  . Stroke Mother 26       Died of CVA  . Hypertension Mother   . Anesthesia problems Neg Hx   . Hypotension Neg Hx   . Malignant hyperthermia Neg Hx   . Pseudochol deficiency Neg Hx     Social History   Socioeconomic History  . Marital status: Married    Spouse name: Not on file  . Number of children: 3  . Years of education: Not on file  . Highest education level: Not on file  Occupational History  . Not on file  Tobacco Use  . Smoking status: Former Smoker    Packs/day: 1.50    Years: 6.00    Pack years: 9.00    Types: Cigarettes  . Smokeless tobacco: Never Used  . Tobacco comment: "stopped smoking in ~ 1990"  Substance and Sexual Activity  . Alcohol use: No    Alcohol/week: 0.0 standard drinks  . Drug use: No  . Sexual activity: Yes  Other Topics Concern  . 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   Please see review of systems for further details on the patient's review from today.   Objective:   Physical Exam:  There were no vitals taken for this visit.  Physical Exam Constitutional:      General: He is not in acute distress.  Appearance: He is well-developed.  Musculoskeletal:        General: No deformity.  Neurological:     Mental Status: He is alert and oriented to person, place, and time.     Motor: No tremor.     Coordination: Coordination normal.     Gait: Gait normal.  Psychiatric:        Attention and Perception: Attention and perception normal.        Mood and Affect: Mood is depressed. Mood is not anxious. Affect is blunt. Affect is not labile, flat, angry or inappropriate.        Speech: Speech normal.        Behavior: Behavior is not slowed.        Thought Content: Thought content is not paranoid. Thought content does not include homicidal or suicidal ideation. Thought content does not include homicidal or suicidal plan.        Cognition and Memory: Cognition normal.        Judgment: Judgment normal.     Comments: Insight intact. No auditory or visual hallucinations. No delusions.  Not ruminative     Lab Review:     Component Value Date/Time   NA 140 03/17/2020 1111   K 4.0 03/17/2020 1111   CL 102 03/17/2020 1111   CO2 32 03/17/2020 1111   GLUCOSE 107 (H) 03/17/2020 1111   BUN 12 03/17/2020 1111   CREATININE 0.91 03/17/2020 1111   CREATININE 1.05 01/13/2018 1542   CALCIUM 9.4 03/17/2020 1111   PROT 6.2 03/17/2020 1111   ALBUMIN 4.1 03/17/2020 1111   AST 21 03/17/2020 1111   ALT 32 03/17/2020 1111   ALKPHOS 66 03/17/2020 1111   BILITOT 0.6 03/17/2020 1111   GFRNONAA >60 10/25/2017 0438   GFRAA >60 10/25/2017 0438       Component Value Date/Time   WBC 6.3 03/17/2020 1111   RBC 4.53 03/17/2020 1111   HGB 14.2 03/17/2020 1111   HCT 41.5 03/17/2020 1111   PLT 234.0 03/17/2020 1111   MCV 91.6 03/17/2020 1111   MCH 30.0 10/25/2017 0438   MCHC 34.3 03/17/2020 1111   RDW 13.8 03/17/2020 1111   LYMPHSABS 1.9 03/17/2020 1111   MONOABS 0.6 03/17/2020 1111   EOSABS 0.1 03/17/2020 1111   BASOSABS 0.1 03/17/2020 1111    No results found for: POCLITH, LITHIUM   No results found  for: PHENYTOIN, PHENOBARB, VALPROATE, CBMZ   .res Assessment: Plan:    Severe recurrent major depression w/psychotic features, mood-congruent ([place]) - Plan: sertraline (ZOLOFT) 100 MG tablet  Panic disorder with agoraphobia - Plan: sertraline (ZOLOFT) 100 MG tablet  Generalized anxiety disorder - Plan: sertraline (ZOLOFT) 100 MG tablet Erectile 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 We discussed [name] has a history of severe depression and panic disorder and anxiety that caused him to go out on disability in 12-10-2017.  He has been back to work and then had to come out of disability again due to back pain.  Since he has completely retired.    He also has a history of short-term disability in December 11, 2006 after the death of his mother and brother.  He has had good response to the combination of Zoloft and Abilify.  Relapse worse off meds.  He has improved significantly but is not quite yet back to baseline.  He is tolerating the meds except the Abilify is making him  hungry.  Continue sertraline 100 mg daily Continue Abilify 10 mg daily for the history of severe paranoid rumination and depression which are improved.  If he is back to baseline at follow-up we will consider reducing the Abilify somewhat in order to reduce the weight gain potential if possible.  Discussed potential metabolic side effects associated with atypical antipsychotics, as well as potential risk for movement side effects. Advised pt to contact office if movement side effects occur.   Rec counseling as soon as available. Lanetta Inch.  Follow-up 2-3 mos  Lynder Parents, MD, DFAPA  Please see After Visit Summary for patient specific instructions.  Future Appointments  Date Time Provider [place]  08/05/2020  4:00 PM Blanchie Serve, PhD [facility] None  08/27/2020  9:00 AM Blanchie Serve, PhD [facility] None  09/10/2020  9:00 AM Blanchie Serve, PhD [facility] None  09/10/2020  1:30 PM Hayden Pedro, MD [facility] None  09/17/2020 10:00 AM [name], NP [id number]  09/24/2020  9:00 AM Blanchie Serve, PhD [facility] None  10/07/2020 10:00 AM Cottle, Billey Co., MD CP-CP None  10/08/2020  9:00 AM Blanchie Serve, PhD [facility] None    No orders of the defined types were placed in this encounter.     -------------------------------

## 2020-08-05 ENCOUNTER — Ambulatory Visit (INDEPENDENT_AMBULATORY_CARE_PROVIDER_SITE_OTHER): Payer: Medicare Other | Admitting: Psychiatry

## 2020-08-05 DIAGNOSIS — Z634 Disappearance and death of family member: Secondary | ICD-10-CM

## 2020-08-05 DIAGNOSIS — F4001 Agoraphobia with panic disorder: Secondary | ICD-10-CM | POA: Diagnosis not present

## 2020-08-05 DIAGNOSIS — F333 Major depressive disorder, recurrent, severe with psychotic symptoms: Secondary | ICD-10-CM
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
## 2020-08-11 ENCOUNTER — Other Ambulatory Visit: Payer: Self-pay | Admitting: Psychiatry

## 2020-08-11 DIAGNOSIS — F333 Major depressive disorder, recurrent, severe with psychotic symptoms: Secondary | ICD-10-CM

## 2020-08-13 ENCOUNTER — Ambulatory Visit: Payer: Medicare Other | Admitting: Mental Health

## 2020-08-20 DIAGNOSIS — H5203 Hypermetropia, bilateral: Secondary | ICD-10-CM | POA: Diagnosis not present

## 2020-08-20 DIAGNOSIS — H524 Presbyopia: Secondary | ICD-10-CM | POA: Diagnosis not present

## 2020-08-27 ENCOUNTER — Ambulatory Visit (INDEPENDENT_AMBULATORY_CARE_PROVIDER_SITE_OTHER): Payer: Medicare Other | Admitting: Psychiatry

## 2020-08-27 ENCOUNTER — Other Ambulatory Visit: Payer: Self-pay

## 2020-08-27 ENCOUNTER — Ambulatory Visit: Payer: Medicare Other | Admitting: Mental Health

## 2020-08-27 DIAGNOSIS — F4001 Agoraphobia with panic disorder: Secondary | ICD-10-CM | POA: Diagnosis not present

## 2020-08-27 DIAGNOSIS — F3341 Major depressive disorder, recurrent, in partial remission: Secondary | ICD-10-CM

## 2020-08-27 NOTE — Progress Notes (Signed)
Psychotherapy Progress Note [facility] Marliss Czar, PhD LP  [name]     MRN: [id number] Therapy format: Individual psychotherapy Date: 08/27/2020      Start: 9:14a     Stop: 10:03a     Time Spent: 49 min Location: In-person   Session narrative (presenting needs, interim history, self-report of stressors and symptoms, applications of prior therapy, status changes, and interventions made in session) Good news with father's health -- cancer in remission.    Did try picking up guitar, a couple days ago, dusted it off, pulled out chord book, then got frustrated with tuning and stiff fingers.  Probed motivation to work on it, reinforced self-compassion should he choose to try to limber up his fingers and try anyway, and suggested he could just work on components like strum technique, or just reaching fingerings, or stretching fingers without having to make a chord configuration.  Re. bereavement, feeling more at peace with Bo Mcclintock passing, not feeling any real need to process.  Not feeling withdrawn now that he's reestablished on meds.  Est. 2-3 months stable now.  No experiences of anxiety nor panic attack since last seen.    Probed motivation to become more "fireproof" with panic, modest interest.  Taught combination of body awareness, focused stretching, diaphragmatic breathing, and paced exhalations.  Oriented to practice and how interoceptive desensitization would work if motivated.  Therapeutic modalities: Cognitive Behavioral Therapy and Solution-Oriented/Positive Psychology  Mental Status/Observations:  Appearance:   Casual     Behavior:  Appropriate  Motor:  Normal  Speech/Language:   Clear and Coherent  Affect:  Appropriate  Mood:  normal  Thought process:  concrete  Thought content:    WNL  Sensory/Perceptual disturbances:    WNL  Orientation:  Fully oriented  Attention:  Good    Concentration:  Good  Memory:  WNL  Insight:     Fair  Judgment:   Good  Impulse Control:  Good   Risk Assessment: Danger to Self: No Self-injurious Behavior: No Danger to Others: No Physical Aggression / Violence: No Duty to Warn: No Access to Firearms a concern: No  Assessment of progress:  progressing well  Diagnosis:   ICD-10-CM   1. Major depressive disorder, recurrent episode, in partial remission (HCC)  F33.41   2. Panic disorder with agoraphobia -- in remission  F40.01    Plan:  . Pick up guitar again, give self permission to play around with it, just hear sounds, explore.  Recommend tuning the instrument and stretching hands before attempting to work up playing he would like to do.  Grant self permission to work on components -- chord fingerings, strum pattern -- rather than go straight to playing (e.g., "Horse with No Name") to buffer against frustration. . Practice muscle/body awareness, targeted muscle tension/relaxation, diaphragmatic breathing, and paced exhalations ("air mattress" breathing) 2/day . Next time can pick up interoceptive desensitization for panic-proofing . Other recommendations/advice as may be noted above . Continue to utilize previously learned skills ad lib . Maintain medication as prescribed and work faithfully with relevant prescriber(s) if any changes are desired or seem indicated . Call the clinic on-call service, present to ER, or call 911 if any life-threatening psychiatric crisis Return for time as available. . Already scheduled visit in this office 09/10/2020.  Robley Fries, PhD Marliss Czar, PhD LP Clinical Psychologist, [facility] [address] [place], [place] [address] [phone]

## 2020-09-10 ENCOUNTER — Encounter (INDEPENDENT_AMBULATORY_CARE_PROVIDER_SITE_OTHER): Payer: Medicare Other | Admitting: Ophthalmology

## 2020-09-10 ENCOUNTER — Ambulatory Visit: Payer: Medicare Other | Admitting: Psychiatry

## 2020-09-10 ENCOUNTER — Other Ambulatory Visit: Payer: Self-pay

## 2020-09-10 ENCOUNTER — Ambulatory Visit: Payer: Medicare Other | Admitting: Mental Health

## 2020-09-10 DIAGNOSIS — H318 Other specified disorders of choroid: Secondary | ICD-10-CM | POA: Diagnosis not present

## 2020-09-10 DIAGNOSIS — I10 Essential (primary) hypertension: Secondary | ICD-10-CM

## 2020-09-10 DIAGNOSIS — H35033 Hypertensive retinopathy, bilateral: Secondary | ICD-10-CM | POA: Diagnosis not present

## 2020-09-10 DIAGNOSIS — H43813 Vitreous degeneration, bilateral: Secondary | ICD-10-CM

## 2020-09-10 DIAGNOSIS — D3132 Benign neoplasm of left choroid: Secondary | ICD-10-CM

## 2020-09-17 ENCOUNTER — Ambulatory Visit: Payer: Medicare Other | Admitting: Family

## 2020-09-23 ENCOUNTER — Encounter: Payer: Self-pay | Admitting: Family

## 2020-09-23 ENCOUNTER — Ambulatory Visit (INDEPENDENT_AMBULATORY_CARE_PROVIDER_SITE_OTHER): Payer: Medicare Other | Admitting: Family

## 2020-09-23 ENCOUNTER — Other Ambulatory Visit: Payer: Self-pay

## 2020-09-23 VITALS — BP 121/68 | HR 64 | Temp 98.3°F | Resp 16 | Ht 67.0 in | Wt 256.2 lb

## 2020-09-23 DIAGNOSIS — R739 Hyperglycemia, unspecified: Secondary | ICD-10-CM

## 2020-09-23 DIAGNOSIS — I10 Essential (primary) hypertension: Secondary | ICD-10-CM

## 2020-09-23 DIAGNOSIS — Z8709 Personal history of other diseases of the respiratory system: Secondary | ICD-10-CM | POA: Diagnosis not present

## 2020-09-23 DIAGNOSIS — D509 Iron deficiency anemia, unspecified: Secondary | ICD-10-CM

## 2020-09-23 LAB — COMPREHENSIVE METABOLIC PANEL
ALT: 29 U/L (ref 0–53)
AST: 21 U/L (ref 0–37)
Albumin: 4.1 g/dL (ref 3.5–5.2)
Alkaline Phosphatase: 75 U/L (ref 39–117)
BUN: 20 mg/dL (ref 6–23)
CO2: 34 mEq/L — ABNORMAL HIGH (ref 19–32)
Calcium: 9.6 mg/dL (ref 8.4–10.5)
Chloride: 101 mEq/L (ref 96–112)
Creatinine, Ser: 0.93 mg/dL (ref 0.40–1.50)
GFR: 85.98 mL/min (ref >60.00)
Glucose, Bld: 90 mg/dL (ref 70–99)
Potassium: 3.7 mEq/L (ref 3.5–5.1)
Sodium: 140 mEq/L (ref 135–145)
Total Bilirubin: 0.4 mg/dL (ref 0.2–1.2)
Total Protein: 6.4 g/dL (ref 6.0–8.3)

## 2020-09-23 LAB — HEMOGLOBIN A1C: Hgb A1c MFr Bld: 5.5 % (ref 4.6–6.5)

## 2020-09-23 NOTE — Patient Instructions (Signed)
Please complete lab work prior to leaving.   

## 2020-09-23 NOTE — Progress Notes (Signed)
Subjective:    [name], male    DOB: Dec 28, 1955, 65 y.o.   MRN: [id number]  HPI  Patient is a 65 yr old male who presents today for follow up. He reports resolution of the inguinal cellulitis.   HTN- Toprol xl 50mg , HCTZ 25mg , lotrel 10-40. Continues lasix 20mg  once daily with Kdur.  BP Readings from Last 3 Encounters:  09/23/20 121/68  07/21/20 129/84  07/14/20 127/68   Asthma- reports no current need for albuterol.    Iron def anemia- taking iron bid.  Lab Results  Component Value Date   WBC 6.3 03/17/2020   HGB 14.2 03/17/2020   HCT 41.5 03/17/2020   MCV 91.6 03/17/2020   PLT 234.0 03/17/2020   Pt had moderna Booster does not remember date.   Hyperglycemia-  Lab Results  Component Value Date   HGBA1C 5.6 03/17/2020   HGBA1C 5.4 06/26/2019   HGBA1C 5.8 08/22/2017   Lab Results  Component Value Date   LDLCALC 139 (H) 06/30/2015   CREATININE 0.91 03/17/2020       Review of Systems See HPI  Past Medical History:  Diagnosis Date  . Anemia 11/16/2014  . Bladder disease    "an unusual bladder disease; don't know what it's called" (06/30/2015)  . Bulging lumbar disc   . GERD (gastroesophageal reflux disease)   . History of hiatal hernia   . Hyperglycemia 11/16/2014  . Hyperlipemia   . Hypertension   . Joint pain, knee   . Migraine    06/30/2015 "maybe 2-3 times/year; not as bad as I used to have them"  . OSA (obstructive sleep apnea)      Social History   Socioeconomic History  . Marital status: Married    Spouse name: Not on file  . Number of children: 3  . Years of education: Not on file  . Highest education level: Not on file  Occupational History  . Not on file  Tobacco Use  . Smoking status: Former Smoker    Packs/day: 1.50    Years: 6.00    Pack years: 9.00    Types: Cigarettes  . Smokeless tobacco: Never Used  . Tobacco comment: "stopped smoking in ~ 1990"  Substance and Sexual Activity  . Alcohol use: No     Alcohol/week: 0.0 standard drinks  . Drug use: No  . Sexual activity: Yes  Other Topics Concern  . 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 ESOPHAGOGASTRODUODENOSCOPY    . ESOPHAGOGASTRODUODENOSCOPY (EGD) WITH ESOPHAGEAL DILATION  ~ 2014  . KNEE ARTHROSCOPY Right 1995  . NASAL SEPTUM SURGERY  ~ 1972  . TOTAL KNEE ARTHROPLASTY  10/11/2011   Procedure: TOTAL KNEE ARTHROPLASTY;  Surgeon: Rudean Haskell, MD;  Location: [place];  Service: Orthopedics;  Laterality: Right;  . 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 Stroke Mother 38  Died of CVA  . Hypertension Mother   . Anesthesia problems Neg Hx   . Hypotension Neg Hx   . Malignant hyperthermia Neg Hx   . Pseudochol deficiency Neg Hx     No Known Allergies  Current Outpatient Medications on File Prior to Visit  Medication Sig Dispense Refill  . albuterol (PROVENTIL HFA;VENTOLIN HFA) 108 (90 Base) MCG/ACT inhaler Inhale 2 puffs into the lungs every 6 (six) hours as needed for wheezing or shortness of breath. 1 Inhaler 2  . albuterol (PROVENTIL) (2.5 MG/3ML) 0.083% nebulizer solution Take 3 mLs (2.5 mg total) by nebulization every 6 (six) hours as needed for wheezing or shortness of breath. 150 mL 1  . ALPRAZolam (XANAX) 0.5 MG tablet Take 1 tablet (0.5 mg total) by mouth 2 (two) times daily as needed for anxiety (panic). 30 tablet 0  . amLODipine-benazepril (LOTREL) 10-40 MG capsule Take 1 capsule by mouth  daily. 90 capsule 3  . ARIPiprazole (ABILIFY) 10 MG tablet TAKE 1 TABLET(10 MG) BY MOUTH DAILY 90 tablet 0  . b complex vitamins tablet Take 1 tablet by mouth daily.    . ferrous sulfate 325 (65 FE) MG tablet Take 1 tablet (325 mg total) by mouth 2 (two) times daily with a meal. 60 tablet 0  . fexofenadine (ALLEGRA) 180 MG tablet Take 1 tablet (180 mg total) by mouth daily. 30 tablet 0  . furosemide (LASIX) 20 MG tablet TAKE 1 TABLET(20 MG) BY MOUTH DAILY 90 tablet 1  . hydrochlorothiazide (HYDRODIURIL) 25 MG tablet Take 1 tablet (25 mg total) by mouth daily. 90 tablet 1  . ketoconazole (NIZORAL) 2 % cream Apply 1 application topically daily. 30 g 0  . metoprolol succinate (TOPROL-XL) 50 MG 24 hr tablet Take 1 tablet (50 mg total) by mouth daily. Take with or immediately following a meal. 90 tablet 1  . Multiple Vitamin (MULTIVITAMIN WITH MINERALS) TABS tablet Take 1 tablet by mouth daily.    . Nebulizers (VIOS AEROSOL DELIVERY SYSTEM) MISC 1 each as directed.  0  . omeprazole (PRILOSEC) 40 MG capsule Take 1 capsule (40 mg total) by mouth daily. 90 capsule 0  . potassium chloride (KLOR-CON) 10 MEQ tablet TAKE 2 TABLETS(20 MEQ) BY MOUTH DAILY 180 tablet 1  . sertraline (ZOLOFT) 100 MG tablet Take 1 tablet (100 mg total) by mouth daily. 90 tablet 0   No current facility-administered medications on file prior to visit.    BP 121/68 (BP Location: Right Arm, Patient Position: Sitting, Cuff Size: Large)   Pulse 64   Temp 98.3 F (36.8 C) (Oral)   Resp 16   Ht 5\' 7"  (1.702 m)   Wt 256 lb 3.2 oz (116.2 kg)   SpO2 97%   BMI 40.13 kg/m       Objective:   Physical Exam Constitutional:      General: He is not in acute distress.    Appearance: He is well-developed and well-nourished.  HENT:     Head: Normocephalic and atraumatic.  Cardiovascular:     Rate and Rhythm: Normal rate and regular rhythm.     Heart sounds: No murmur heard.   Pulmonary:     Effort: Pulmonary effort is normal.  No respiratory distress.     Breath sounds: Normal breath sounds. No wheezing or rales.  Musculoskeletal:        General: No edema.  Skin:    General: Skin is warm and dry.  Neurological:     Mental Status:  He is alert and oriented to person, place, and time.  Psychiatric:        Mood and Affect: Mood and affect normal.        Behavior: Behavior normal.        Thought Content: Thought content normal.           Assessment & Plan:  HTN- bp is stable. Continue oprol xl 50mg , HCTZ 25mg , lotrel 10-40. Continues lasix 20mg  once daily with Kdur.  Asthma- reports no recent need for albuterol. He reports that he usually only needs with bronchitis.  Iron deficiency anemia- continue iron supplement. Last CBC stable.  Hyperglycemia- obtain follow up A1c. Consider discussion of statin next visit. Back in 2017 he declined statin therapy.  This visit occurred during the SARS-CoV-2 public health emergency.  Safety protocols were in place, including screening questions prior to the visit, additional usage of staff PPE, and extensive cleaning of exam room while observing appropriate contact time as indicated for disinfecting solutions.

## 2020-09-24 ENCOUNTER — Ambulatory Visit: Payer: Medicare Other | Admitting: Mental Health

## 2020-09-24 ENCOUNTER — Ambulatory Visit (INDEPENDENT_AMBULATORY_CARE_PROVIDER_SITE_OTHER): Payer: Medicare Other | Admitting: Psychiatry

## 2020-09-24 ENCOUNTER — Telehealth: Payer: Self-pay | Admitting: Family

## 2020-09-24 DIAGNOSIS — F3340 Major depressive disorder, recurrent, in remission, unspecified: Secondary | ICD-10-CM | POA: Diagnosis not present

## 2020-09-24 DIAGNOSIS — Z634 Disappearance and death of family member: Secondary | ICD-10-CM

## 2020-09-24 DIAGNOSIS — F4001 Agoraphobia with panic disorder: Secondary | ICD-10-CM | POA: Diagnosis not present

## 2020-09-24 NOTE — Telephone Encounter (Signed)
Patient call to inform you on the date he took his booster shot which was on the 08/08/20

## 2020-09-24 NOTE — Telephone Encounter (Signed)
Information entered in records

## 2020-09-24 NOTE — Progress Notes (Addendum)
Psychotherapy Progress Note [facility] [name], PhD LP  [name]     MRN: [id number] Therapy format: Individual psychotherapy Date: 09/24/2020      Start: 9:11a     Stop: 9:34a     Time Spent: 23 min Location: In-person   Session narrative (presenting needs, interim history, self-report of stressors and symptoms, applications of prior therapy, status changes, and interventions made in session) Got alarmed about a potential noshow charge, wants to make sure it's understood that staff told him (erroneously) [place] would be out when last scheduled (1/19).  Assured would not be charged and  documented.  Has gotten some modest practice at the relaxation skill, agrees he felt some reduction in tension.  Denies panic attacks and any triggers for anxiety in the past month.  Not explicitly interested in developing panic control skill, just feels better back stable on meds.  Otherwise, just "trying to get back to normal".  Hard to specify what would be more normal except feeling more motivated, able to "get over the hump".  Very difficult to specify what would happen, or what he would be doing, if that were coming true, or if today was going to be 2-3% better than yesterday.  Picking up guitar is a wish that comes and goes, but never that compelling, not a burning desire of any kind.    PHQ-9 from PCP's office very recently scores 0, though he may be underreporting or failing to notice relatively mild sxs.  In interaction today, seems to be demotivated compared to what may be his normal, but acceptable to patient.  Eventually notes that one morale boost today would be to go to Bible study at 10am, where there is an older couple he really likes being with.  As it is about 25 minutes away anda more desirable way to spend his time, agreed to shorten session and go do that.  Briefly discussed outlook for therapy, will most likely go PRN after a check-in in 3 weeks,  previously scheduled.  Feels clear he has largely gotten over Burnard Leigh and Tina's deaths, and being back on medication has been stable, wife is satisfied he is back on track.  Therapeutic modalities: Cognitive Behavioral Therapy and Solution-Oriented/Positive Psychology  Mental Status/Observations:  Appearance:   Casual     Behavior:  Appropriate  Motor:  Normal  Speech/Language:   Clear and Coherent  Affect:  Appropriate  Mood:  somewhat demotivated  Thought process:  concrete and vagueness  Thought content:    normal, somewhat impoverished  Sensory/Perceptual disturbances:    WNL  Orientation:  Fully oriented  Attention:  Good    Concentration:  Good  Memory:  grossly intact  Insight:    Fair  Judgment:   Good  Impulse Control:  Good   Risk Assessment: Danger to Self: No Self-injurious Behavior: No Danger to Others: No Physical Aggression / Violence: No Duty to Warn: No Access to Firearms a concern: No  Assessment of progress:  stabilized  Diagnosis:   ICD-10-CM   1. Major depressive disorder, recurrent, in remission ([place])  F33.40   2. Bereavement in remission  Z63.4   3. Panic disorder with agoraphobia -- in remission  F40.01    Plan:  . Attend Bible study today as agreed, enjoy desired company . Continue practicing relaxation skill ad lib . Look for where behavioral desires might be -- what would happen, what would he actually be doing if things were getting  better than they have so far . Most likely terminate after next session, return to med management only and PRN availability for [place] . Other recommendations/advice as may be noted above . Continue to utilize previously learned skills ad lib . Maintain medication as prescribed and work faithfully with relevant prescriber(s) if any changes are desired or seem indicated . Call the clinic on-call service, present to ER, or call 911 if any life-threatening psychiatric crisis Return for session(s) already  scheduled. . Already scheduled visit in this office 10/07/2020.  Blanchie Serve, PhD [name], PhD LP Clinical Psychologist, [facility] [address] [place], [place] [address] [phone]

## 2020-09-24 NOTE — Progress Notes (Signed)
This appointment is not a noshow -- PT was told erroneously that provider would be out this day by staff looking at the wrong Wednesday.  [name], PhD LP Clinical Psychologist, [facility] [address], [place], [place] [address] [phone]

## 2020-10-07 ENCOUNTER — Encounter: Payer: Self-pay | Admitting: Psychiatry

## 2020-10-07 ENCOUNTER — Ambulatory Visit (INDEPENDENT_AMBULATORY_CARE_PROVIDER_SITE_OTHER): Payer: Medicare Other | Admitting: Psychiatry

## 2020-10-07 ENCOUNTER — Other Ambulatory Visit: Payer: Self-pay

## 2020-10-07 DIAGNOSIS — F411 Generalized anxiety disorder: Secondary | ICD-10-CM

## 2020-10-07 DIAGNOSIS — F4001 Agoraphobia with panic disorder: Secondary | ICD-10-CM | POA: Diagnosis not present

## 2020-10-07 DIAGNOSIS — F333 Major depressive disorder, recurrent, severe with psychotic symptoms: Secondary | ICD-10-CM | POA: Diagnosis not present

## 2020-10-07 DIAGNOSIS — G4710 Hypersomnia, unspecified: Secondary | ICD-10-CM

## 2020-10-07 DIAGNOSIS — G4730 Sleep apnea, unspecified: Secondary | ICD-10-CM

## 2020-10-07 MED ORDER — SERTRALINE HCL 100 MG PO TABS
100.0000 mg | ORAL_TABLET | Freq: Every day | ORAL | 0 refills | Status: DC
Start: 2020-10-07 — End: 2021-01-07

## 2020-10-07 NOTE — Progress Notes (Signed)
Jeremy [name] [id number] Dec 25, 1955 65 y.o.     Subjective:   Patient ID:  Jeremy [name] is a 65 y.o. (DOB 04-Apr-1956) male.  Chief Complaint:  Chief Complaint  Patient presents with  . Follow-up  . Severe recurrent major depression w/psychotic features, moo  . Sleeping Problem    Depression        Associated symptoms include no decreased concentration and no suicidal ideas.  Past medical history includes anxiety.   Anxiety Patient reports no chest pain, confusion, decreased concentration, nervous/anxious behavior, palpitations, shortness of breath or suicidal ideas.      Jeremy [name] presents  today for follow-up of anxiety and depression.   He had been out on disability due to his back since September at that time.  seen April 2020.  Since retirement he has been under less stress and had asked to reduce and hopefully eliminate some medications.  We reduced Abilify from 7.5 to 5 mg daily.  As of December 04, 2019 the following is noted: Still good. nOTHing worse with dose reduction.   Benefit from meds. Help depression, anxiety and anger.  They are all under control. No mood swings nor prolonged depression.  Anxiety OK.  Wants to try to reduce meds again if possible bc less stress as noted. Plan because of sexual side effects with sertraline he prefers to reduce sertraline to 50 mg daily and continue Abilify 5 mg daily.  02/21/2020 appointment with the following noted: Doing well still after reducing sertraline to 50 mg daily but no improvement in sexual function.  depression and anxiety  Are still under control. No panic.   Plan:  We discussed the options of trying to eliminate gradually the Abilify versus reducing the sertraline which is probably more risky.  However he is having sexual side effects from sertraline and no side effects from Abilify.  He prefers to stop the sertraline and see if he can get by without it. Therefore reduce sertraline to 25 mg daily for 2 weeks  then stop it  05/07/20 appt with the following noted: Off sertraline without more depression or anxiety.  No panic.  No withdrawal effects. Sexual function is not better.  Erection problems. Tried Viagra without help.  Saw urologist and had shots which helped then stopped helping. He wants to try stopping Abilify to see if sexual function will return. Plan: Since retiring his stress level has reduced significantly.  His depression and anxiety are under control.  He wants to try to reduce medications again.   He wants to try DC Abilify to see if sexual function is better despite risk relapse. He's been fine so far off sertraline.    06/02/2020 phone call from patient:Pt called and said that he had a panic attack yesterday. He wants to know if he can go back on his medications MD response: Fairly recently discontinued sertraline 50 mg daily.  That is the best antipanic med.  Have him restart sertraline 25 mg daily for 7 days and then 50 mg daily.  Will take several weeks to help.  Hold off the Abilify bc that was for depression. 06/13/20 TC: Pt LM on VM needing apt today to see CC. Returned call, Pt stated he is still having issues with anxiety and needs to see CC today. His follow up apt is 12/13.   2 panic attacks but close today.  06/16/2020 urgent appointment with the following noted: Seen with wife Panic and wanted to restart meds.  Wife  says he's more depressed.  Only sleeps and eats and is like a walking zombie without meds. Lost father and stepmother and friend.  She thinks he's depressed.  B committed suicide years ago.   Now he thinks I'm going to leave him. Family notices his depression.  Having to help adopted father with cancer. He agrees with wife. Tense and SOB with anxiety. Wife thinks he represses things.  Mo left him in an orphanage and has abandonment issues even with her.  Thinking she will leave. Plan: Relapse worse off meds  Increase sertraline to 100 mg daily.  May need  to go higher Increase Abilify to 10 mg daily for severe depression with paranoid rumination. OK prn Xanax prn panic   08/04/20 appt with following noted:  Seen with wife, [name] Better with tension.  Feels less down too.   SE increased appetite.  But had recently lost 20 # and lately snacking more. Wife sees dramatic turnaround and he's more talkative and engaged and friends notice too.  It's working. No Xanax used.  Staying awake during day.   Not quite back to normal but really improved per wife.   Plan: If he is back to baseline at follow-up we will consider reducing the Abilify somewhat in order to reduce the weight gain potential if possible.  10/07/2020 appointment with following noted: Still sleeping too much and eating a lot.  Otherwise is OK.  Doesn't feel depressed.  Interest is a little better.  Not anxious.    Not aware of SE otherwise.  Not worrying too much.  Has retired.  Has reduced his stress significantly. Using CPAP. No panic.  Past history of psychiatric disability. Not sure how many episodes of depression he's had in life.  Historically about the same levels of depression and anxiety   Past Psychiatric Medication Trials: Sertraline 100 Abilify 7.5 Xanax 0.5  Review of Systems:  Review of Systems  Respiratory: Negative for shortness of breath.   Cardiovascular: Negative for chest pain and palpitations.  Genitourinary:       ED persistent  Musculoskeletal: Positive for arthralgias and back pain.  Psychiatric/Behavioral: Positive for depression and dysphoric mood. Negative for agitation, behavioral problems, confusion, decreased concentration, hallucinations, self-injury, sleep disturbance and suicidal ideas. The patient is not nervous/anxious and is not hyperactive.     Medications: I have reviewed the patient's current medications.  Current Outpatient Medications  Medication Sig Dispense Refill  . albuterol (PROVENTIL HFA;VENTOLIN HFA) 108 (90 Base) MCG/ACT  inhaler Inhale 2 puffs into the lungs every 6 (six) hours as needed for wheezing or shortness of breath. 1 Inhaler 2  . albuterol (PROVENTIL) (2.5 MG/3ML) 0.083% nebulizer solution Take 3 mLs (2.5 mg total) by nebulization every 6 (six) hours as needed for wheezing or shortness of breath. 150 mL 1  . ALPRAZolam (XANAX) 0.5 MG tablet Take 1 tablet (0.5 mg total) by mouth 2 (two) times daily as needed for anxiety (panic). 30 tablet 0  . amLODipine-benazepril (LOTREL) 10-40 MG capsule Take 1 capsule by mouth daily. 90 capsule 3  . ARIPiprazole (ABILIFY) 10 MG tablet TAKE 1 TABLET(10 MG) BY MOUTH DAILY 90 tablet 0  . b complex vitamins tablet Take 1 tablet by mouth daily.    . ferrous sulfate 325 (65 FE) MG tablet Take 1 tablet (325 mg total) by mouth 2 (two) times daily with a meal. 60 tablet 0  . fexofenadine (ALLEGRA) 180 MG tablet Take 1 tablet (180 mg total) by mouth daily. [place]  tablet 0  . furosemide (LASIX) 20 MG tablet TAKE 1 TABLET(20 MG) BY MOUTH DAILY 90 tablet 1  . hydrochlorothiazide (HYDRODIURIL) 25 MG tablet Take 1 tablet (25 mg total) by mouth daily. 90 tablet 1  . ketoconazole (NIZORAL) 2 % cream Apply 1 application topically daily. 30 g 0  . metoprolol succinate (TOPROL-XL) 50 MG 24 hr tablet Take 1 tablet (50 mg total) by mouth daily. Take with or immediately following a meal. 90 tablet 1  . Multiple Vitamin (MULTIVITAMIN WITH MINERALS) TABS tablet Take 1 tablet by mouth daily.    . Nebulizers (VIOS AEROSOL DELIVERY SYSTEM) MISC 1 each as directed.  0  . omeprazole (PRILOSEC) 40 MG capsule Take 1 capsule (40 mg total) by mouth daily. 90 capsule 0  . potassium chloride (KLOR-CON) 10 MEQ tablet TAKE 2 TABLETS(20 MEQ) BY MOUTH DAILY 180 tablet 1  . sertraline (ZOLOFT) 100 MG tablet Take 1 tablet (100 mg total) by mouth daily. 90 tablet 0   No current facility-administered medications for this visit.    Medication Side Effects: ED with Zoloft.  Viagra failed.  Using shots.  Allergies:  No Known Allergies  Past Medical History:  Diagnosis Date  . Anemia 11/16/2014  . Bladder disease    "an unusual bladder disease; don't know what it's called" (06/30/2015)  . Bulging lumbar disc   . GERD (gastroesophageal reflux disease)   . History of hiatal hernia   . Hyperglycemia 11/16/2014  . Hyperlipemia   . Hypertension   . Joint pain, knee   . Migraine    06/30/2015 "maybe 2-3 times/year; not as bad as I used to have them"  . OSA (obstructive sleep apnea)     Family History  Problem Relation Age of Onset  . Stroke Mother 31       Died of CVA  . Hypertension Mother   . Anesthesia problems Neg Hx   . Hypotension Neg Hx   . Malignant hyperthermia Neg Hx   . Pseudochol deficiency Neg Hx     Social History   Socioeconomic History  . Marital status: Married    Spouse name: Not on file  . Number of children: 3  . Years of education: Not on file  . Highest education level: Not on file  Occupational History  . Not on file  Tobacco Use  . Smoking status: Former Smoker    Packs/day: 1.50    Years: 6.00    Pack years: 9.00    Types: Cigarettes  . Smokeless tobacco: Never Used  . Tobacco comment: "stopped smoking in ~ 1990"  Substance and Sexual Activity  . Alcohol use: No    Alcohol/week: 0.0 standard drinks  . Drug use: No  . Sexual activity: Yes  Other Topics Concern  . 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   Please see review of systems for further  details on the patient's review from today.  Objective:   Physical Exam:  There were no vitals taken for this visit.  Physical Exam Constitutional:      General: He is not in acute distress.    Appearance: He is well-developed.  Musculoskeletal:        General: No deformity.  Neurological:     Mental Status: He is alert and oriented to person, place, and time.     Motor: No tremor.     Coordination: Coordination normal.     Gait: Gait normal.  Psychiatric:        Attention and Perception: Attention and perception normal.        Mood and Affect: Mood is not anxious or depressed. Affect is blunt. Affect is not labile, flat, angry or inappropriate.        Speech: Speech normal.        Behavior: Behavior is not slowed.        Thought Content: Thought content is not paranoid. Thought content does not include homicidal or suicidal ideation. Thought content does not include homicidal or suicidal plan.        Cognition and Memory: Cognition normal.        Judgment: Judgment normal.     Comments: Insight intact. No auditory or visual hallucinations. No delusions.  Not ruminative     Lab Review:     Component Value Date/Time   NA 140 09/23/2020 0949   K 3.7 09/23/2020 0949   CL 101 09/23/2020 0949   CO2 34 (H) 09/23/2020 0949   GLUCOSE 90 09/23/2020 0949   BUN 20 09/23/2020 0949   CREATININE 0.93 09/23/2020 0949   CREATININE 1.05 01/13/2018 1542   CALCIUM 9.6 09/23/2020 0949   PROT 6.4 09/23/2020 0949   ALBUMIN 4.1 09/23/2020 0949   AST 21 09/23/2020 0949   ALT 29 09/23/2020 0949   ALKPHOS 75 09/23/2020 0949   BILITOT 0.4 09/23/2020 0949   GFRNONAA >60 10/25/2017 0438   GFRAA >60 10/25/2017 0438       Component Value Date/Time   WBC 6.3 03/17/2020 1111   RBC 4.53 03/17/2020 1111   HGB 14.2 03/17/2020 1111   HCT 41.5 03/17/2020 1111   PLT 234.0 03/17/2020 1111   MCV 91.6 03/17/2020 1111   MCH 30.0 10/25/2017 0438   MCHC 34.3 03/17/2020 1111   RDW 13.8 03/17/2020  1111   LYMPHSABS 1.9 03/17/2020 1111   MONOABS 0.6 03/17/2020 1111   EOSABS 0.1 03/17/2020 1111   BASOSABS 0.1 03/17/2020 1111    No results found for: POCLITH, LITHIUM   No results found for: PHENYTOIN, PHENOBARB, VALPROATE, CBMZ   .res Assessment: Plan:    Severe recurrent major depression w/psychotic features, mood-congruent ([place]) - Plan: sertraline (ZOLOFT) 100 MG tablet  Panic disorder with agoraphobia -- in remission  Generalized anxiety disorder - Plan: sertraline (ZOLOFT) 100 MG tablet  Hypersomnia with sleep apnea  Panic disorder with agoraphobia - Plan: sertraline (ZOLOFT) 100 MG tablet Erectile 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 We discussed Jeremy [name] has a history of severe depression and panic disorder and anxiety that caused him to go out on disability in Nov 25, 2017.  He has been back to work and then had to come out of disability again due to back pain.  Since he has completely retired.    He also has a history of short-term disability in 2006/11/26 after the death of his mother  and brother.  He has had good response to the combination of Zoloft and Abilify.  Relapse worse off meds. Back to baseline except sedated and hungry.  Continue sertraline 100 mg daily Reduce Abilify to 5 mg daily for the history of severe paranoid rumination and depression which are improved, but apparently it's sedating him. Disc risk relapse.  Discussed potential metabolic side effects associated with atypical antipsychotics, as well as potential risk for movement side effects. Advised pt to contact office if movement side effects occur.   Rec counseling as soon as available. Lanetta Inch.  Follow-up 2-3 mos  Lynder Parents, MD, DFAPA  Please see After Visit Summary for patient specific instructions.  Future Appointments  Date Time Provider [place]  10/13/2020 10:00 AM Blanchie Serve, PhD  CP-CP None  03/10/2021  9:00 AM Hayden Pedro, MD TRE-TRE None  03/23/2021  9:00 AM [name], NP [facility]    No orders of the defined types were placed in this encounter.     -------------------------------

## 2020-10-08 ENCOUNTER — Ambulatory Visit: Payer: Medicare Other | Admitting: Mental Health

## 2020-10-08 ENCOUNTER — Ambulatory Visit: Payer: Medicare Other | Admitting: Psychiatry

## 2020-10-13 ENCOUNTER — Ambulatory Visit (INDEPENDENT_AMBULATORY_CARE_PROVIDER_SITE_OTHER): Payer: Medicare Other | Admitting: Psychiatry

## 2020-10-13 ENCOUNTER — Other Ambulatory Visit: Payer: Self-pay

## 2020-10-13 DIAGNOSIS — F3341 Major depressive disorder, recurrent, in partial remission: Secondary | ICD-10-CM

## 2020-10-13 DIAGNOSIS — N539 Unspecified male sexual dysfunction: Secondary | ICD-10-CM

## 2020-10-13 DIAGNOSIS — G4710 Hypersomnia, unspecified: Secondary | ICD-10-CM | POA: Diagnosis not present

## 2020-10-13 DIAGNOSIS — F4001 Agoraphobia with panic disorder: Secondary | ICD-10-CM

## 2020-10-13 DIAGNOSIS — G4730 Sleep apnea, unspecified: Secondary | ICD-10-CM

## 2020-10-13 NOTE — Progress Notes (Signed)
Psychotherapy Progress Note [facility] [name], PhD LP  [name]     MRN: [id number] Therapy format: Individual psychotherapy Date: 10/13/2020      Start: 10:20a     Stop: 11:00a     Time Spent: 40 min Location: In-person   Session narrative (presenting needs, interim history, self-report of stressors and symptoms, applications of prior therapy, status changes, and interventions made in session) Est 8/10 mood, no depression to speak of, and no anxiety attacks.  Generally, feels like it had all to do with the string of losses he went through.  Lessons taken that he can get through, don't despair.  Has found prayer and talking to various people has helped settle grief.  Affirmed and encouraged reaching out and being ready to talk to himself the ay he would like to be talked to as a child.  Last session wound up going to Bible study, enjoyed it anticipates continuing with it.  Retirement about 1.5 yrs in now.  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  Discussed remotivating to go there, when/how he would resume.  Probed his interest in guitar, still figures it's a nonstarter b/c of his stiff fingers.  Encouraged stretching them, a la the old [employer] Hugger workout show on TV.  Knows he needs to lose some weight.  Gym time and cardiovascular exercise obviously make sense.  Loves starches, and tends to gobble food quickly, in front of TV.  Advised on benefits of slowing down, smaller portions, unhooking from TV, remembering that it takes 20 minutes for the "full" signal to reach the brain no matter how much or how fast food gets in stomach.  Advised partnering with wife Jeremy [name] on any changes in eating patterns he would like to make.    Medication (Abilify) reduced, since been sleeping and eating too much.  Still 100mg  Zoloft.  F/u in April, anticipates 4 wks to see results of the dose change.  Reviewed medical  advice from last meeting, refreshed the idea to notice if relapses in depression or anxiety.  Queried "paranoid" ruminations mentioned -- these were only happening at work, and the idea was whether he was measuring up, or rejected by others, not fitting in.  All in keeping with his history of being adopted.  No dangerous notions or evil suspicions with others.  Medically, currently on BP medication and diuretic, tweaked recently.  Advised that reducing salt would present some hope of lightening up on these meds and their side effects (frequent need to urinate, fatigue, ED).  Does wish he could overcome ED.  Assured that cardiovascular exercise, better BP control, and reducing Zoloft and BP med -- once OK to do so -- would be the most likely ways to help restore sexual functioning.  Discussed out look for therapy, feels ready to take these initiatives on his own, go on an as-needed basis.  Therapeutic modalities: Cognitive Behavioral Therapy, Solution-Oriented/Positive Psychology and Psycho-education/Bibliotherapy  Mental Status/Observations:  Appearance:   Casual     Behavior:  Appropriate  Motor:  Normal  Speech/Language:   Clear and Coherent and limited content  Affect:  Constricted  Mood:  normal  Thought process:  concrete  Thought content:    WNL  Sensory/Perceptual disturbances:    WNL  Orientation:  Fully oriented  Attention:  Good    Concentration:  Good  Memory:  WNL  Insight:    Fair  Judgment:  Good  Impulse Control:  Good   Risk Assessment: Danger to Self: No Self-injurious Behavior: No Danger to Others: No Physical Aggression / Violence: No Duty to Warn: No Access to Firearms a concern: No  Assessment of progress:  progressing  Diagnosis:   ICD-10-CM   1. Major depressive disorder, recurrent episode, in partial remission ([place])  F33.41   2. Panic disorder with agoraphobia -- in remission  F40.01   3. Hypersomnia with sleep apnea  G47.10    G47.30   4. 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 Agreement to hit the gym next Sunday afternoon.  Priority to restore cardiovascular exercise to address several concerns . Partner with wife Jeremy [name] on food management -- reduced portions, reduced salt, slower eating, try to eat away from TV . Report in if depressive feelings and intrusive thoughts relapse with medication decrease . Other recommendations/advice as may be noted above . Continue to utilize previously learned skills ad lib . Maintain medication as prescribed and work faithfully with relevant prescriber(s) if any changes are desired or seem indicated . Call the clinic on-call service, present to ER, or call 911 if any life-threatening psychiatric crisis Return as needed; PRN for now. . Already scheduled visit in this office 12/04/2020.  Blanchie Serve, PhD [name], PhD LP Clinical Psychologist, [facility] [address] [place], [place] [address] [phone]

## 2020-10-22 ENCOUNTER — Ambulatory Visit: Payer: Medicare Other | Admitting: Mental Health

## 2020-11-07 ENCOUNTER — Other Ambulatory Visit: Payer: Self-pay | Admitting: Psychiatry

## 2020-11-07 DIAGNOSIS — F333 Major depressive disorder, recurrent, severe with psychotic symptoms: Secondary | ICD-10-CM

## 2020-11-14 ENCOUNTER — Other Ambulatory Visit: Payer: Self-pay | Admitting: Family

## 2020-12-03 ENCOUNTER — Telehealth: Payer: Self-pay | Admitting: Family

## 2020-12-03 MED ORDER — POTASSIUM CHLORIDE CRYS ER 10 MEQ PO TBCR: EXTENDED_RELEASE_TABLET | ORAL | 1 refills | Status: DC

## 2020-12-03 NOTE — Telephone Encounter (Signed)
Rx refill was sent to his pharmacy

## 2020-12-03 NOTE — Telephone Encounter (Signed)
Medication: potassium chloride (KLOR-CON) 10 MEQ tablet [[id number]]       Has the patient contacted their pharmacy?  (If no, request that the patient contact the pharmacy for the refill.) (If yes, when and what did the pharmacy advise?)     Preferred Pharmacy (with phone number or street name): [facility] [place], [address] [place]  [place], [place] [address]  Phone:  [phone] Fax:  [phone]      Agent: Please be advised that RX refills may take up to 3 business days. We ask that you follow-up with your pharmacy.

## 2020-12-04 ENCOUNTER — Ambulatory Visit: Payer: Medicare Other | Admitting: Psychiatry

## 2020-12-11 ENCOUNTER — Ambulatory Visit (INDEPENDENT_AMBULATORY_CARE_PROVIDER_SITE_OTHER): Payer: Medicare Other | Admitting: Psychiatry

## 2020-12-11 ENCOUNTER — Encounter: Payer: Self-pay | Admitting: Psychiatry

## 2020-12-11 ENCOUNTER — Other Ambulatory Visit: Payer: Self-pay

## 2020-12-11 DIAGNOSIS — F411 Generalized anxiety disorder: Secondary | ICD-10-CM | POA: Diagnosis not present

## 2020-12-11 DIAGNOSIS — F4001 Agoraphobia with panic disorder: Secondary | ICD-10-CM | POA: Diagnosis not present

## 2020-12-11 DIAGNOSIS — N539 Unspecified male sexual dysfunction: Secondary | ICD-10-CM

## 2020-12-11 DIAGNOSIS — F333 Major depressive disorder, recurrent, severe with psychotic symptoms: Secondary | ICD-10-CM

## 2020-12-11 DIAGNOSIS — G4710 Hypersomnia, unspecified: Secondary | ICD-10-CM | POA: Diagnosis not present

## 2020-12-11 DIAGNOSIS — G4730 Sleep apnea, unspecified: Secondary | ICD-10-CM

## 2020-12-11 NOTE — Progress Notes (Signed)
Jeremy [name] [id number] 01/26/1956 65 y.o.     Subjective:   Patient ID:  Jeremy [name] is a 65 y.o. (DOB 12-May-1956) male.  Chief Complaint:  Chief Complaint  Patient presents with  . Follow-up  . Severe recurrent major depression w/psychotic features, moo  . Anxiety  . Depression  . Medication Reaction    Depression        Associated symptoms include no decreased concentration and no suicidal ideas.  Past medical history includes anxiety.   Anxiety Patient reports no chest pain, confusion, decreased concentration, nervous/anxious behavior, palpitations, shortness of breath or suicidal ideas.      Jeremy [name] presents  today for follow-up of anxiety and depression.   He had been out on disability due to his back since September at that time.  seen April 2020.  Since retirement he has been under less stress and had asked to reduce and hopefully eliminate some medications.  We reduced Abilify from 7.5 to 5 mg daily.  As of December 04, 2019 the following is noted: Still good. nOTHing worse with dose reduction.   Benefit from meds. Help depression, anxiety and anger.  They are all under control. No mood swings nor prolonged depression.  Anxiety OK.  Wants to try to reduce meds again if possible bc less stress as noted. Plan because of sexual side effects with sertraline he prefers to reduce sertraline to 50 mg daily and continue Abilify 5 mg daily.  02/21/2020 appointment with the following noted: Doing well still after reducing sertraline to 50 mg daily but no improvement in sexual function.  depression and anxiety  Are still under control. No panic.   Plan:  We discussed the options of trying to eliminate gradually the Abilify versus reducing the sertraline which is probably more risky.  However he is having sexual side effects from sertraline and no side effects from Abilify.  He prefers to stop the sertraline and see if he can get by without it. Therefore reduce  sertraline to 25 mg daily for 2 weeks then stop it  05/07/20 appt with the following noted: Off sertraline without more depression or anxiety.  No panic.  No withdrawal effects. Sexual function is not better.  Erection problems. Tried Viagra without help.  Saw urologist and had shots which helped then stopped helping. He wants to try stopping Abilify to see if sexual function will return. Plan: Since retiring his stress level has reduced significantly.  His depression and anxiety are under control.  He wants to try to reduce medications again.   He wants to try DC Abilify to see if sexual function is better despite risk relapse. He's been fine so far off sertraline.    06/02/2020 phone call from patient:Pt called and said that he had a panic attack yesterday. He wants to know if he can go back on his medications MD response: Fairly recently discontinued sertraline 50 mg daily.  That is the best antipanic med.  Have him restart sertraline 25 mg daily for 7 days and then 50 mg daily.  Will take several weeks to help.  Hold off the Abilify bc that was for depression. 06/13/20 TC: Pt LM on VM needing apt today to see CC. Returned call, Pt stated he is still having issues with anxiety and needs to see CC today. His follow up apt is 12/13.   2 panic attacks but close today.  06/16/2020 urgent appointment with the following noted: Seen with wife Panic and  wanted to restart meds.  Wife says he's more depressed.  Only sleeps and eats and is like a walking zombie without meds. Lost father and stepmother and friend.  She thinks he's depressed.  B committed suicide years ago.   Now he thinks I'm going to leave him. Family notices his depression.  Having to help adopted father with cancer. He agrees with wife. Tense and SOB with anxiety. Wife thinks he represses things.  Mo left him in an orphanage and has abandonment issues even with her.  Thinking she will leave. Plan: Relapse worse off meds  Increase  sertraline to 100 mg daily.  May need to go higher Increase Abilify to 10 mg daily for severe depression with paranoid rumination. OK prn Xanax prn panic   08/04/20 appt with following noted:  Seen with wife, [name] Better with tension.  Feels less down too.   SE increased appetite.  But had recently lost 20 # and lately snacking more. Wife sees dramatic turnaround and he's more talkative and engaged and friends notice too.  It's working. No Xanax used.  Staying awake during day.   Not quite back to normal but really improved per wife.   Plan: If he is back to baseline at follow-up we will consider reducing the Abilify somewhat in order to reduce the weight gain potential if possible.  10/07/2020 appointment with following noted: Still sleeping too much and eating a lot.  Otherwise is OK.  Doesn't feel depressed.  Interest is a little better.  Not anxious.    Not aware of SE otherwise.  Not worrying too much.  Has retired.  Has reduced his stress significantly. Using CPAP. No panic. Plan: Continue sertraline 100 mg daily Reduce Abilify to 5 mg daily for the history of severe paranoid rumination and depression which are improved, but apparently it's sedating him.  12/11/2020 appointment with the following noted: Reduced Abilify to 5 mg daily without much change.   Mood been alright withuot depression.  Anxiety is not a problem. Sleep 9 hours at night and then naps in the morning after breakfast and then in afternoon.  Not much to do.  Not much he wants to do in retirement except golf and physical limitations hurt that. No other hobbies.     Weight stable.  He stopped the Abilify couple of weeks ago bc insurance told him they would not cover it and it would be $800 dollars.  Past history of psychiatric disability. Not sure how many episodes of depression he's had in life.  Historically about the same levels of depression and anxiety   Past Psychiatric Medication Trials: Sertraline  100 Abilify 10 helped Xanax 0.5  Review of Systems:  Review of Systems  Respiratory: Negative for shortness of breath.   Cardiovascular: Negative for chest pain and palpitations.  Genitourinary:       ED persistent  Musculoskeletal: Positive for arthralgias and back pain.  Psychiatric/Behavioral: Positive for depression. Negative for agitation, behavioral problems, confusion, decreased concentration, dysphoric mood, hallucinations, self-injury, sleep disturbance and suicidal ideas. The patient is not nervous/anxious and is not hyperactive.     Medications: I have reviewed the patient's current medications.  Current Outpatient Medications  Medication Sig Dispense Refill  . albuterol (PROVENTIL HFA;VENTOLIN HFA) 108 (90 Base) MCG/ACT inhaler Inhale 2 puffs into the lungs every 6 (six) hours as needed for wheezing or shortness of breath. 1 Inhaler 2  . albuterol (PROVENTIL) (2.5 MG/3ML) 0.083% nebulizer solution Take 3 mLs (2.5 mg total)  by nebulization every 6 (six) hours as needed for wheezing or shortness of breath. 150 mL 1  . ALPRAZolam (XANAX) 0.5 MG tablet Take 1 tablet (0.5 mg total) by mouth 2 (two) times daily as needed for anxiety (panic). 30 tablet 0  . amLODipine-benazepril (LOTREL) 10-40 MG capsule Take 1 capsule by mouth daily. 90 capsule 3  . ARIPiprazole (ABILIFY) 10 MG tablet TAKE 1 TABLET(10 MG) BY MOUTH DAILY (Patient taking differently: Take 5 mg by mouth daily.) 90 tablet 0  . b complex vitamins tablet Take 1 tablet by mouth daily.    . ferrous sulfate 325 (65 FE) MG tablet Take 1 tablet (325 mg total) by mouth 2 (two) times daily with a meal. 60 tablet 0  . fexofenadine (ALLEGRA) 180 MG tablet Take 1 tablet (180 mg total) by mouth daily. 30 tablet 0  . furosemide (LASIX) 20 MG tablet TAKE 1 TABLET(20 MG) BY MOUTH DAILY 90 tablet 1  . hydrochlorothiazide (HYDRODIURIL) 25 MG tablet Take 1 tablet (25 mg total) by mouth daily. 90 tablet 1  . ketoconazole (NIZORAL) 2 %  cream Apply 1 application topically daily. 30 g 0  . metoprolol succinate (TOPROL-XL) 50 MG 24 hr tablet Take 1 tablet (50 mg total) by mouth daily. Take with or immediately following a meal. 90 tablet 1  . Multiple Vitamin (MULTIVITAMIN WITH MINERALS) TABS tablet Take 1 tablet by mouth daily.    . Nebulizers (VIOS AEROSOL DELIVERY SYSTEM) MISC 1 each as directed.  0  . omeprazole (PRILOSEC) 40 MG capsule Take 1 capsule (40 mg total) by mouth daily. 90 capsule 0  . potassium chloride (KLOR-CON) 10 MEQ tablet TAKE 2 TABLETS(20 MEQ) BY MOUTH DAILY 180 tablet 1  . sertraline (ZOLOFT) 100 MG tablet Take 1 tablet (100 mg total) by mouth daily. 90 tablet 0   No current facility-administered medications for this visit.    Medication Side Effects: ED with Zoloft.  Viagra failed.  Using shots.  Allergies: Not on File  Past Medical History:  Diagnosis Date  . Anemia 11/16/2014  . Bladder disease    "an unusual bladder disease; don't know what it's called" (06/30/2015)  . Bulging lumbar disc   . GERD (gastroesophageal reflux disease)   . History of hiatal hernia   . Hyperglycemia 11/16/2014  . Hyperlipemia   . Hypertension   . Joint pain, knee   . Migraine    06/30/2015 "maybe 2-3 times/year; not as bad as I used to have them"  . OSA (obstructive sleep apnea)     Family History  Problem Relation Age of Onset  . Stroke Mother 50       Died of CVA  . Hypertension Mother   . Anesthesia problems Neg Hx   . Hypotension Neg Hx   . Malignant hyperthermia Neg Hx   . Pseudochol deficiency Neg Hx     Social History   Socioeconomic History  . Marital status: Married    Spouse name: Not on file  . Number of children: 3  . Years of education: Not on file  . Highest education level: Not on file  Occupational History  . Not on file  Tobacco Use  . Smoking status: Former Smoker    Packs/day: 1.50    Years: 6.00    Pack years: 9.00    Types: Cigarettes  . Smokeless tobacco: Never Used  .  Tobacco comment: "stopped smoking in ~ 1990"  Substance and Sexual Activity  . Alcohol use: No  Alcohol/week: 0.0 standard drinks  . Drug use: No  . Sexual activity: Yes  Other Topics Concern  . 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   Please see review of systems for further details on the patient's review from today.   Objective:   Physical Exam:  There were no vitals taken for this visit.  Physical Exam Constitutional:      General: He is not in acute distress.    Appearance: He is well-developed.  Musculoskeletal:        General: No deformity.  Neurological:     Mental Status: He is alert and oriented to person, place, and time.     Motor: No tremor.     Coordination: Coordination normal.     Gait: Gait normal.  Psychiatric:        Attention and Perception: Attention and perception normal.        Mood and Affect: Mood is not anxious or depressed. Affect is blunt. Affect is not labile, flat, angry or inappropriate.        Speech: Speech normal.        Behavior: Behavior is not slowed.        Thought Content: Thought content is not paranoid. Thought content does not include homicidal or suicidal ideation. Thought content does not include homicidal or suicidal plan.        Cognition and Memory: Cognition normal.        Judgment: Judgment normal.     Comments: Insight intact. No auditory or visual hallucinations. No delusions.  Not ruminative     Lab Review:      Component Value Date/Time   NA 140 09/23/2020 0949   K 3.7 09/23/2020 0949   CL 101 09/23/2020 0949   CO2 34 (H) 09/23/2020 0949   GLUCOSE 90 09/23/2020 0949   BUN 20 09/23/2020 0949   CREATININE 0.93 09/23/2020 0949   CREATININE 1.05 01/13/2018 1542   CALCIUM 9.6 09/23/2020 0949   PROT 6.4 09/23/2020 0949   ALBUMIN 4.1 09/23/2020 0949   AST 21 09/23/2020 0949   ALT 29 09/23/2020 0949   ALKPHOS 75 09/23/2020 0949   BILITOT 0.4 09/23/2020 0949   GFRNONAA >60 10/25/2017 0438   GFRAA >60 10/25/2017 0438       Component Value Date/Time   WBC 6.3 03/17/2020 1111   RBC 4.53 03/17/2020 1111   HGB 14.2 03/17/2020 1111   HCT 41.5 03/17/2020 1111   PLT 234.0 03/17/2020 1111   MCV 91.6 03/17/2020 1111   MCH 30.0 10/25/2017 0438   MCHC 34.3 03/17/2020 1111   RDW 13.8 03/17/2020 1111   LYMPHSABS 1.9 03/17/2020 1111   MONOABS 0.6 03/17/2020 1111   EOSABS 0.1 03/17/2020 1111   BASOSABS 0.1 03/17/2020 1111    No results found for: POCLITH, LITHIUM   No results found for: PHENYTOIN, PHENOBARB, VALPROATE, CBMZ   .res Assessment: Plan:    Severe recurrent major  depression w/psychotic features, mood-congruent ([place])  Panic disorder with agoraphobia -- in remission  Hypersomnia with sleep apnea  Generalized anxiety disorder  Male sexual dysfunction Erectile 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 We discussed [name] has a history of severe depression and panic disorder and anxiety that caused him to go out on disability in 2019.  He has been back to work and then had to come out of disability again due to back pain.  Since he has completely retired.   He relapsed after retirement off meds.  He has had good response to the combination of Zoloft and Abilify.  Relapse worse off meds. Back to baseline.  He's much too inactive in retirement.  Emphasized need to be more active in retirment  otherwise health will decline including mental health.  Wife busy in wildlife rehab.    Continue sertraline 100 mg daily He stopped Abilify 5 mg 2 weeks ago DT cost Disc risk relapse.  Call if relapse bc at risk for a couple of mos.  Discussed potential metabolic side effects associated with atypical antipsychotics, as well as potential risk for movement side effects. Advised pt to contact office if movement side effects occur.   Rec counseling as soon as available. Lanetta Inch.  Follow-up 3 mos  Lynder Parents, MD, DFAPA  Please see After Visit Summary for patient specific instructions.  Future Appointments  Date Time Provider [place]  03/10/2021  9:00 AM Hayden Pedro, MD [facility] None  03/23/2021  9:00 AM [name], NP [facility]    No orders of the defined types were placed in this encounter.     -------------------------------

## 2020-12-22 ENCOUNTER — Other Ambulatory Visit: Payer: Self-pay

## 2020-12-22 ENCOUNTER — Ambulatory Visit (INDEPENDENT_AMBULATORY_CARE_PROVIDER_SITE_OTHER): Payer: Medicare Other | Admitting: Family

## 2020-12-22 VITALS — BP 126/72 | HR 64 | Temp 98.6°F | Resp 16 | Ht 67.0 in | Wt 255.0 lb

## 2020-12-22 DIAGNOSIS — M5416 Radiculopathy, lumbar region: Secondary | ICD-10-CM

## 2020-12-22 MED ORDER — METHYLPREDNISOLONE 4 MG PO TBPK: ORAL_TABLET | ORAL | 0 refills | Status: DC

## 2020-12-22 NOTE — Patient Instructions (Signed)
Please begin medrol dose pak.  Call if symptoms worsen or if not improved in 1 weeks.

## 2020-12-22 NOTE — Progress Notes (Signed)
Subjective:   By signing my name below, I, [name], attest that this documentation has been prepared under the direction and in the presence of Jeremy Alar, NP. 12/22/2020   [name], male    DOB: [birth year], 65 y.o.   MRN: [id number]  No chief complaint on file.   HPI Patient is in today for a office visit. He is complaining of lower back pain for past 3 weeks. It radiates down through his buttocks to his right ankle. He is having weakness in his right leg and adds that his right foot feels numb. He is taking ibuprofen to manage his pain but finds no relief. He has no recent history of losing control of his bowel or bladder.  Of note, he has hx of lumbar disc disease.  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  2 mm anterolisthesis L5-S1 secondary to BILATERAL L5 pars defects. Uncovering of the disc with central protrusion extending to both foramina. BILATERAL L5 neural impingement is noted.   Past Medical History:  Diagnosis Date  . Anemia 11/16/2014  . Bladder disease    "an unusual bladder disease; don't know what it's called" (06/30/2015)  . Bulging lumbar disc   . GERD (gastroesophageal reflux disease)   . History of hiatal hernia   . Hyperglycemia 11/16/2014  . Hyperlipemia   . Hypertension   . Joint pain, knee   . Migraine    06/30/2015 "maybe 2-3 times/year; not as bad as I used to have them"  . OSA (obstructive sleep apnea)     Past Surgical History:  Procedure Laterality Date  . ESOPHAGOGASTRODUODENOSCOPY    . ESOPHAGOGASTRODUODENOSCOPY (EGD) WITH ESOPHAGEAL DILATION  ~ 2014  . KNEE ARTHROSCOPY Right 1995  . NASAL SEPTUM SURGERY  ~ 1972  . TOTAL KNEE ARTHROPLASTY  10/11/2011   Procedure: TOTAL KNEE ARTHROPLASTY;  Surgeon: Rudean Haskell, MD;  Location: [facility];  Service: Orthopedics;  Laterality: Right;  . TRANSURETHRAL RESECTION OF BLADDER TUMOR WITH  GYRUS (TURBT-GYRUS)  ~2014    Family History  Problem Relation Age of Onset  . Stroke Mother 35       Died of CVA  . Hypertension Mother   . Anesthesia problems Neg Hx   . Hypotension Neg Hx   . Malignant hyperthermia Neg Hx   . Pseudochol deficiency Neg Hx     Social History   Socioeconomic History  . Marital status: Married    Spouse name: Not on file  . Number of children: 3  . Years of education: Not on file  . Highest education level: Not on file  Occupational History  . Not on file  Tobacco Use  . Smoking status: Former Smoker    Packs/day: 1.50    Years: 6.00    Pack years: 9.00    Types: Cigarettes  . Smokeless tobacco: Never Used  . Tobacco comment: "stopped smoking in ~ 1990"  Substance and Sexual Activity  . Alcohol use: No    Alcohol/week: 0.0 standard drinks  . Drug use: No  . Sexual activity: Yes  Other Topics Concern  . 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 albuterol (PROVENTIL HFA;VENTOLIN HFA) 108 (90 Base) MCG/ACT inhaler Inhale 2 puffs into the lungs every 6 (six) hours as needed for wheezing or shortness of breath. 1 Inhaler 2  . albuterol (PROVENTIL) (2.5 MG/3ML) 0.083% nebulizer solution Take 3 mLs (2.5 mg total) by nebulization every 6 (six) hours as needed for wheezing or shortness of breath. 150 mL 1  . ALPRAZolam (XANAX) 0.5 MG tablet Take 1 tablet (0.5 mg total) by mouth 2 (two) times daily as needed for anxiety (panic). 30 tablet 0  . amLODipine-benazepril (LOTREL) 10-40 MG  capsule Take 1 capsule by mouth daily. 90 capsule 3  . ARIPiprazole (ABILIFY) 10 MG tablet TAKE 1 TABLET(10 MG) BY MOUTH DAILY (Patient taking differently: Take 5 mg by mouth daily. He stopped 2 weeks ago) 90 tablet 0  . b complex vitamins tablet Take 1 tablet by mouth daily.    . ferrous sulfate 325 (65 FE) MG tablet Take 1 tablet (325 mg total) by mouth 2 (two) times daily with a meal. 60 tablet 0  . fexofenadine (ALLEGRA) 180 MG tablet Take 1 tablet (180 mg total) by mouth daily. 30 tablet 0  . furosemide (LASIX) 20 MG tablet TAKE 1 TABLET(20 MG) BY MOUTH DAILY 90 tablet 1  . hydrochlorothiazide (HYDRODIURIL) 25 MG tablet Take 1 tablet (25 mg total) by mouth daily. 90 tablet 1  . ketoconazole (NIZORAL) 2 % cream Apply 1 application topically daily. 30 g 0  . metoprolol succinate (TOPROL-XL) 50 MG 24 hr tablet Take 1 tablet (50 mg total) by mouth daily. Take with or immediately following a meal. 90 tablet 1  . Multiple Vitamin (MULTIVITAMIN WITH MINERALS) TABS tablet Take 1 tablet by mouth daily.    . Nebulizers (VIOS AEROSOL DELIVERY SYSTEM) MISC 1 each as directed.  0  . omeprazole (PRILOSEC) 40 MG capsule Take 1 capsule (40 mg total) by mouth daily. 90 capsule 0  . potassium chloride (KLOR-CON) 10 MEQ tablet TAKE 2 TABLETS(20 MEQ) BY MOUTH DAILY 180 tablet 1  . sertraline (ZOLOFT) 100 MG tablet Take 1 tablet (100 mg total) by mouth daily. 90 tablet 0   No facility-administered medications prior to visit.    Not on File  Review of Systems  Musculoskeletal: Positive for back pain (lumbar).  Neurological: Positive for weakness (right leg).       Objective:    Physical Exam Constitutional:      Appearance: Normal appearance.  HENT:     Head: Normocephalic and atraumatic.  Cardiovascular:     Rate and Rhythm: Normal rate and regular rhythm.     Heart sounds: Normal heart sounds.  Pulmonary:     Effort: Pulmonary effort is normal.     Breath sounds: Normal breath sounds.   Musculoskeletal:     Comments: 5/5 strength bilateral lower extremities.  Neurological:     Mental Status: He is alert.     Deep Tendon Reflexes:     Reflex Scores:      Patellar reflexes are 3+ on the right side and 3+ on the left side.    There were no vitals taken for this visit. Wt Readings from Last 3 Encounters:  09/23/20 256 lb 3.2 oz (116.2 kg)  07/21/20  245 lb (111.1 kg)  07/14/20 242 lb (109.8 kg)    Diabetic Foot Exam - Simple   No data filed    Lab Results  Component Value Date   WBC 6.3 03/17/2020   HGB 14.2 03/17/2020   HCT 41.5 03/17/2020   PLT 234.0 03/17/2020   GLUCOSE 90 09/23/2020   CHOL 171 03/17/2020   TRIG 213.0 (H) 03/17/2020   HDL 35.00 (L) 03/17/2020   LDLDIRECT 114.0 03/17/2020   LDLCALC 139 (H) 06/30/2015   ALT 29 09/23/2020   AST 21 09/23/2020   NA 140 09/23/2020   K 3.7 09/23/2020   CL 101 09/23/2020   CREATININE 0.93 09/23/2020   BUN 20 09/23/2020   CO2 34 (H) 09/23/2020   TSH 3.61 05/03/2016   PSA 0.57 03/17/2020   INR 0.90 10/01/2011   HGBA1C 5.5 09/23/2020    Lab Results  Component Value Date   TSH 3.61 05/03/2016   Lab Results  Component Value Date   WBC 6.3 03/17/2020   HGB 14.2 03/17/2020   HCT 41.5 03/17/2020   MCV 91.6 03/17/2020   PLT 234.0 03/17/2020   Lab Results  Component Value Date   NA 140 09/23/2020   K 3.7 09/23/2020   CO2 34 (H) 09/23/2020   GLUCOSE 90 09/23/2020   BUN 20 09/23/2020   CREATININE 0.93 09/23/2020   BILITOT 0.4 09/23/2020   ALKPHOS 75 09/23/2020   AST 21 09/23/2020   ALT 29 09/23/2020   PROT 6.4 09/23/2020   ALBUMIN 4.1 09/23/2020   CALCIUM 9.6 09/23/2020   ANIONGAP 9 10/25/2017   GFR 85.98 09/23/2020   Lab Results  Component Value Date   CHOL 171 03/17/2020   Lab Results  Component Value Date   HDL 35.00 (L) 03/17/2020   Lab Results  Component Value Date   LDLCALC 139 (H) 06/30/2015   Lab Results  Component Value Date   TRIG 213.0 (H) 03/17/2020   Lab Results   Component Value Date   CHOLHDL 5 03/17/2020   Lab Results  Component Value Date   HGBA1C 5.5 09/23/2020       Assessment & Plan:   Problem List Items Addressed This Visit   None    I, Nance Pear, NP, have reviewed all documentation for this visit. The documentation on 12/22/20 for the exam, diagnosis, procedures, and orders are all accurate and complete. Sherayar Baig served as a Education administrator for this visit.

## 2020-12-22 NOTE — Assessment & Plan Note (Signed)
Uncontrolled. Will rx with medrol dose pak. Pt is advised to call if symptoms worsen or if symptoms fail to improve.

## 2020-12-26 ENCOUNTER — Other Ambulatory Visit: Payer: Self-pay | Admitting: Family

## 2020-12-29 ENCOUNTER — Ambulatory Visit (INDEPENDENT_AMBULATORY_CARE_PROVIDER_SITE_OTHER): Payer: Medicare Other | Admitting: Family

## 2020-12-29 ENCOUNTER — Other Ambulatory Visit: Payer: Self-pay

## 2020-12-29 ENCOUNTER — Encounter: Payer: Self-pay | Admitting: Family

## 2020-12-29 VITALS — BP 118/71 | HR 61 | Temp 98.4°F | Resp 16 | Ht 67.0 in | Wt 250.0 lb

## 2020-12-29 DIAGNOSIS — M5416 Radiculopathy, lumbar region: Secondary | ICD-10-CM

## 2020-12-29 NOTE — Progress Notes (Signed)
Subjective:   By signing my name below, I, Jeremy [name], attest that this documentation has been prepared under the direction and in the presence of [name] NP. 12/29/2020    [name], male    DOB: [birth year], 65 y.o.   MRN: [id number]  No chief complaint on file.   HPI   Patient is in today for a office visit. He continues having low back pain for past 3 weeks which radiates down into his buttock and ankle which has not improved since his last visit. We gave him a  medrol dose pack this past week to manage his pain but he has found minimal. He has visited a neuro surgeon in the past to have these issues.    Past Medical History:  Diagnosis Date  . Anemia 11/16/2014  . Bladder disease    "an unusual bladder disease; don't know what it's called" (06/30/2015)  . Bulging lumbar disc   . GERD (gastroesophageal reflux disease)   . History of hiatal hernia   . Hyperglycemia 11/16/2014  . Hyperlipemia   . Hypertension   . Joint pain, knee   . Migraine    06/30/2015 "maybe 2-3 times/year; not as bad as I used to have them"  . OSA (obstructive sleep apnea)     Past Surgical History:  Procedure Laterality Date  . ESOPHAGOGASTRODUODENOSCOPY    . ESOPHAGOGASTRODUODENOSCOPY (EGD) WITH ESOPHAGEAL DILATION  ~ 2014  . KNEE ARTHROSCOPY Right 1995  . NASAL SEPTUM SURGERY  ~ 1972  . TOTAL KNEE ARTHROPLASTY  10/11/2011   Procedure: TOTAL KNEE ARTHROPLASTY;  Surgeon: Rudean Haskell, MD;  Location: [place];  Service: Orthopedics;  Laterality: Right;  . 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 Stroke Mother 15       Died of CVA  . Hypertension Mother   . Anesthesia problems Neg Hx   . Hypotension Neg Hx   . Malignant hyperthermia Neg Hx   . Pseudochol deficiency Neg Hx     Social History   Socioeconomic History  . Marital status: Married    Spouse name: Not on file  .  Number of children: 3  . Years of education: Not on file  . Highest education level: Not on file  Occupational History  . Not on file  Tobacco Use  . Smoking status: Former Smoker    Packs/day: 1.50    Years: 6.00    Pack years: 9.00    Types: Cigarettes  . Smokeless tobacco: Never Used  . Tobacco comment: "stopped smoking in ~ 1990"  Substance and Sexual Activity  . Alcohol use: No    Alcohol/week: 0.0 standard drinks  . Drug use: No  . Sexual activity: Yes  Other Topics Concern  . 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 albuterol (PROVENTIL HFA;VENTOLIN HFA) 108 (90 Base) MCG/ACT inhaler  Inhale 2 puffs into the lungs every 6 (six) hours as needed for wheezing or shortness of breath. 1 Inhaler 2  . albuterol (PROVENTIL) (2.5 MG/3ML) 0.083% nebulizer solution Take 3 mLs (2.5 mg total) by nebulization every 6 (six) hours as needed for wheezing or shortness of breath. 150 mL 1  . amLODipine-benazepril (LOTREL) 10-40 MG capsule Take 1 capsule by mouth daily. 90 capsule 3  . b complex vitamins tablet Take 1 tablet by mouth daily.    . ferrous sulfate 325 (65 FE) MG tablet Take 1 tablet (325 mg total) by mouth 2 (two) times daily with a meal. 60 tablet 0  . fexofenadine (ALLEGRA) 180 MG tablet Take 1 tablet (180 mg total) by mouth daily. 30 tablet 0  . furosemide (LASIX) 20 MG tablet TAKE 1 TABLET(20 MG) BY MOUTH DAILY 90 tablet 1  . hydrochlorothiazide (HYDRODIURIL) 25 MG tablet TAKE 1 TABLET(25 MG) BY MOUTH DAILY 90 tablet 1   . ketoconazole (NIZORAL) 2 % cream Apply 1 application topically daily. 30 g 0  . methylPREDNISolone (MEDROL DOSEPAK) 4 MG TBPK tablet Take by mouth per package instructions. 21 tablet 0  . metoprolol succinate (TOPROL-XL) 50 MG 24 hr tablet Take 1 tablet (50 mg total) by mouth daily. Take with or immediately following a meal. 90 tablet 1  . Multiple Vitamin (MULTIVITAMIN WITH MINERALS) TABS tablet Take 1 tablet by mouth daily.    . Nebulizers (VIOS AEROSOL DELIVERY SYSTEM) MISC 1 each as directed.  0  . omeprazole (PRILOSEC) 40 MG capsule Take 1 capsule (40 mg total) by mouth daily. 90 capsule 0  . potassium chloride (KLOR-CON) 10 MEQ tablet TAKE 2 TABLETS(20 MEQ) BY MOUTH DAILY 180 tablet 1  . sertraline (ZOLOFT) 100 MG tablet Take 1 tablet (100 mg total) by mouth daily. 90 tablet 0   No facility-administered medications prior to visit.    Not on File  Review of Systems  Musculoskeletal: Positive for back pain (Lower back) and myalgias (buttocks and lower extremities).       Objective:    Physical Exam Constitutional:      Appearance: Normal appearance.  HENT:     Head: Normocephalic and atraumatic.     Right Ear: External ear normal.     Left Ear: External ear normal.  Eyes:     Extraocular Movements: Extraocular movements intact.     Pupils: Pupils are equal, round, and reactive to light.  Cardiovascular:     Rate and Rhythm: Normal rate and regular rhythm.     Pulses: Normal pulses.     Heart sounds: Normal heart sounds.  Pulmonary:     Effort: Pulmonary effort is normal. No respiratory distress.     Breath sounds: No stridor. Wheezing present. No rhonchi or rales.  Musculoskeletal:     Cervical back: Normal. No tenderness.     Thoracic back: Normal. No tenderness.     Lumbar back: Normal. No tenderness.     Comments: 5/5 strength in upper and lower extremities.  Skin:    General: Skin is warm and dry.  Neurological:     Mental Status: He is alert and oriented to  person, place, and time.     Deep Tendon Reflexes:     Reflex Scores:      Patellar reflexes are 3+ on the right side and 3+ on the left side. Psychiatric:        Behavior: Behavior normal.     There were no vitals taken for  this visit. Wt Readings from Last 3 Encounters:  12/22/20 255 lb (115.7 kg)  09/23/20 256 lb 3.2 oz (116.2 kg)  07/21/20 245 lb (111.1 kg)    Diabetic Foot Exam - Simple   No data filed    Lab Results  Component Value Date   WBC 6.3 03/17/2020   HGB 14.2 03/17/2020   HCT 41.5 03/17/2020   PLT 234.0 03/17/2020   GLUCOSE 90 09/23/2020   CHOL 171 03/17/2020   TRIG 213.0 (H) 03/17/2020   HDL 35.00 (L) 03/17/2020   LDLDIRECT 114.0 03/17/2020   LDLCALC 139 (H) 06/30/2015   ALT 29 09/23/2020   AST 21 09/23/2020   NA 140 09/23/2020   K 3.7 09/23/2020   CL 101 09/23/2020   CREATININE 0.93 09/23/2020   BUN 20 09/23/2020   CO2 34 (H) 09/23/2020   TSH 3.61 05/03/2016   PSA 0.57 03/17/2020   INR 0.90 10/01/2011   HGBA1C 5.5 09/23/2020    Lab Results  Component Value Date   TSH 3.61 05/03/2016   Lab Results  Component Value Date   WBC 6.3 03/17/2020   HGB 14.2 03/17/2020   HCT 41.5 03/17/2020   MCV 91.6 03/17/2020   PLT 234.0 03/17/2020   Lab Results  Component Value Date   NA 140 09/23/2020   K 3.7 09/23/2020   CO2 34 (H) 09/23/2020   GLUCOSE 90 09/23/2020   BUN 20 09/23/2020   CREATININE 0.93 09/23/2020   BILITOT 0.4 09/23/2020   ALKPHOS 75 09/23/2020   AST 21 09/23/2020   ALT 29 09/23/2020   PROT 6.4 09/23/2020   ALBUMIN 4.1 09/23/2020   CALCIUM 9.6 09/23/2020   ANIONGAP 9 10/25/2017   GFR 85.98 09/23/2020   Lab Results  Component Value Date   CHOL 171 03/17/2020   Lab Results  Component Value Date   HDL 35.00 (L) 03/17/2020   Lab Results  Component Value Date   LDLCALC 139 (H) 06/30/2015   Lab Results  Component Value Date   TRIG 213.0 (H) 03/17/2020   Lab Results  Component Value Date   CHOLHDL 5 03/17/2020    Lab Results  Component Value Date   HGBA1C 5.5 09/23/2020       Assessment & Plan:   Problem List Items Addressed This Visit   None      No orders of the defined types were placed in this encounter.   I, [name] NP, personally preformed the services described in this documentation.  All medical record entries made by the scribe were at my direction and in my presence.  I have reviewed the chart and discharge instructions (if applicable) and agree that the record reflects my personal performance and is accurate and complete. 12/29/2020   I,Jeremy [name],acting as a scribe for Nance Pear, NP.,have documented all relevant documentation on the behalf of Nance Pear, NP,as directed by  Nance Pear, NP while in the presence of Nance Pear, NP.   Jeremy [employer]

## 2020-12-29 NOTE — Patient Instructions (Signed)
You should be contacted about scheduling your appointment with the Neurosurgeon.

## 2020-12-29 NOTE — Assessment & Plan Note (Signed)
Uncontrolled.  Will refer pt back to his neurosurgeon for ongoing management.

## 2020-12-31 DIAGNOSIS — M4310 Spondylolisthesis, site unspecified: Secondary | ICD-10-CM | POA: Diagnosis not present

## 2021-01-02 ENCOUNTER — Other Ambulatory Visit: Payer: Self-pay | Admitting: Family

## 2021-01-05 ENCOUNTER — Telehealth: Payer: Self-pay | Admitting: Family

## 2021-01-05 NOTE — Telephone Encounter (Signed)
Medication: metoprolol succinate (TOPROL-XL) 50 MG 24 hr tablet [[id number]]      Has the patient contacted their pharmacy? no (If no, request that the patient contact the pharmacy for the refill.) (If yes, when and what did the pharmacy advise?)    Preferred Pharmacy (with phone number or street name):  [facility] [place], [place] Phone:  [phone]  Fax:  [phone]        Agent: Please be advised that RX refills may take up to 3 business days. We ask that you follow-up with your pharmacy.

## 2021-01-05 NOTE — Telephone Encounter (Signed)
Rx sent earlier this morning,.

## 2021-01-07 ENCOUNTER — Other Ambulatory Visit: Payer: Self-pay | Admitting: Psychiatry

## 2021-01-07 DIAGNOSIS — F4001 Agoraphobia with panic disorder: Secondary | ICD-10-CM

## 2021-01-07 DIAGNOSIS — F411 Generalized anxiety disorder: Secondary | ICD-10-CM

## 2021-01-07 DIAGNOSIS — F333 Major depressive disorder, recurrent, severe with psychotic symptoms: Secondary | ICD-10-CM

## 2021-01-09 ENCOUNTER — Other Ambulatory Visit: Payer: Self-pay | Admitting: Physician Assistant

## 2021-01-09 DIAGNOSIS — U071 COVID-19: Secondary | ICD-10-CM

## 2021-01-09 HISTORY — DX: COVID-19: U07.1

## 2021-01-09 MED ORDER — NIRMATRELVIR/RITONAVIR (PAXLOVID)TABLET: 3.0000 | ORAL_TABLET | Freq: Two times a day (BID) | ORAL | 0 refills | Status: DC

## 2021-01-09 NOTE — Progress Notes (Signed)
Outpatient Oral COVID Treatment Note  I connected with Jeremy [name] on 01/09/2021/10:21 AM by telephone and verified that I am speaking with the correct person using two identifiers.  I discussed the limitations, risks, security, and privacy concerns of performing an evaluation and management service by telephone and the availability of in person appointments. I also discussed with the patient that there may be a patient responsible charge related to this service. The patient expressed understanding and agreed to proceed.  Patient location: home  Provider location: office   Diagnosis: COVID-19 infection  Purpose of visit: Discussion of potential use of Molnupiravir or Paxlovid, a new treatment for mild to moderate COVID-19 viral infection in non-hospitalized patients.   Subjective: Patient is a 65 y.o. male who has been diagnosed with COVID 19 viral infection.  Their symptoms began on 5/19 with fatigue and cough and cold.    Past Medical History:  Diagnosis Date  . Anemia 11/16/2014  . Bladder disease    "an unusual bladder disease; don't know what it's called" (06/30/2015)  . Bulging lumbar disc   . GERD (gastroesophageal reflux disease)   . History of hiatal hernia   . Hyperglycemia 11/16/2014  . Hyperlipemia   . Hypertension   . Joint pain, knee   . Migraine    06/30/2015 "maybe 2-3 times/year; not as bad as I used to have them"  . OSA (obstructive sleep apnea)     Not on File   Current Outpatient Medications:  .  albuterol (PROVENTIL HFA;VENTOLIN HFA) 108 (90 Base) MCG/ACT inhaler, Inhale 2 puffs into the lungs every 6 (six) hours as needed for wheezing or shortness of breath., Disp: 1 Inhaler, Rfl: 2 .  albuterol (PROVENTIL) (2.5 MG/3ML) 0.083% nebulizer solution, Take 3 mLs (2.5 mg total) by nebulization every 6 (six) hours as needed for wheezing or shortness of breath., Disp: 150 mL, Rfl: 1 .  amLODipine-benazepril (LOTREL) 10-40 MG capsule, Take 1 capsule by mouth daily.,  Disp: 90 capsule, Rfl: 3 .  b complex vitamins tablet, Take 1 tablet by mouth daily., Disp: , Rfl:  .  ferrous sulfate 325 (65 FE) MG tablet, Take 1 tablet (325 mg total) by mouth 2 (two) times daily with a meal., Disp: 60 tablet, Rfl: 0 .  fexofenadine (ALLEGRA) 180 MG tablet, Take 1 tablet (180 mg total) by mouth daily., Disp: 30 tablet, Rfl: 0 .  furosemide (LASIX) 20 MG tablet, TAKE 1 TABLET(20 MG) BY MOUTH DAILY, Disp: 90 tablet, Rfl: 1 .  hydrochlorothiazide (HYDRODIURIL) 25 MG tablet, TAKE 1 TABLET(25 MG) BY MOUTH DAILY, Disp: 90 tablet, Rfl: 1 .  ketoconazole (NIZORAL) 2 % cream, Apply 1 application topically daily., Disp: 30 g, Rfl: 0 .  metoprolol succinate (TOPROL-XL) 50 MG 24 hr tablet, TAKE 1 TABLET(50 MG) BY MOUTH DAILY WITH OR IMMEDIATELY FOLLOWING A MEAL, Disp: 90 tablet, Rfl: 1 .  Multiple Vitamin (MULTIVITAMIN WITH MINERALS) TABS tablet, Take 1 tablet by mouth daily., Disp: , Rfl:  .  Nebulizers (VIOS AEROSOL DELIVERY SYSTEM) MISC, 1 each as directed., Disp: , Rfl: 0 .  omeprazole (PRILOSEC) 40 MG capsule, Take 1 capsule (40 mg total) by mouth daily., Disp: 90 capsule, Rfl: 0 .  potassium chloride (KLOR-CON) 10 MEQ tablet, TAKE 2 TABLETS(20 MEQ) BY MOUTH DAILY, Disp: 180 tablet, Rfl: 1 .  sertraline (ZOLOFT) 100 MG tablet, TAKE 1 TABLET(100 MG) BY MOUTH DAILY, Disp: 90 tablet, Rfl: 0  Objective: Patient sounds good on phone.  They are in no apparent  distress.  Breathing is non labored.  Mood and behavior are normal.  Laboratory Data:  No results found for this or any previous visit (from the past 2160 hour(s)).   Assessment: 65 y.o. male with mild/moderate COVID 19 viral infection diagnosed on 5/19 at high risk for progression to severe COVID 19.  Plan:  This patient is a 65 y.o. male that meets the following criteria for Emergency Use Authorization of: Paxlovid 1. Age >12 yr AND > 40 kg 2. SARS-COV-2 positive test 3. Symptom onset < 5 days 4. Mild-to-moderate COVID  disease with high risk for severe progression to hospitalization or death  I have spoken and communicated the following to the patient or parent/caregiver regarding: 1. Paxlovid is an unapproved drug that is authorized for use under an Emergency Use Authorization.  2. There are no adequate, approved, available products for the treatment of COVID-19 in adults who have mild-to-moderate COVID-19 and are at high risk for progressing to severe COVID-19, including hospitalization or death. 3. Other therapeutics are currently authorized. For additional information on all products authorized for treatment or prevention of COVID-19, please see [email or web address].  4. There are benefits and risks of taking this treatment as outlined in the "Fact Sheet for Patients and Caregivers."  5. "Fact Sheet for Patients and Caregivers" was reviewed with patient. A hard copy will be provided to patient from pharmacy prior to the patient receiving treatment. 6. Patients should continue to self-isolate and use infection control measures (e.g., wear mask, isolate, social distance, avoid sharing personal items, clean and disinfect "high touch" surfaces, and frequent handwashing) according to CDC guidelines.  7. The patient or parent/caregiver has the option to accept or refuse treatment. 8. Patient medication history was reviewed for potential drug interactions:Interaction with home meds: Lotrel (will hold it) 9. Patient's GFR was calculated to be >60, and they were therefore prescribed Normal dose (GFR>60) - nirmatrelvir 150mg  tab (2 tablet) by mouth twice daily AND ritonavir 100mg  tab (1 tablet) by mouth twice daily   After reviewing above information with the patient, the patient agrees to receive Paxlovid.  Follow up instructions:    . Take prescription BID x 5 days as directed . Reach out to pharmacist for  counseling on medication if desired . For concerns regarding further COVID symptoms please follow up with your PCP or urgent care . For urgent or life-threatening issues, seek care at your local emergency department  The patient was provided an opportunity to ask questions, and all were answered. The patient agreed with the plan and demonstrated an understanding of the instructions.   Script sent to [facility] and opted to pick up RX.  The patient was advised to call their PCP or seek an in-person evaluation if the symptoms worsen or if the condition fails to improve as anticipated.   I provided 10 minutes of non face-to-face telephone visit time during this encounter, and > 50% was spent counseling as documented under my assessment & plan.  Angelena Form, PA-C 01/09/2021 /10:21 AM

## 2021-01-12 ENCOUNTER — Telehealth (INDEPENDENT_AMBULATORY_CARE_PROVIDER_SITE_OTHER): Payer: Medicare Other | Admitting: Family

## 2021-01-12 VITALS — BP 133/78

## 2021-01-12 DIAGNOSIS — U071 COVID-19: Secondary | ICD-10-CM | POA: Diagnosis not present

## 2021-01-12 NOTE — Assessment & Plan Note (Signed)
Pt is advised to complete paxlovid. He is also advised of CDC guidelines for self isolation/ ending isolation.  Advised of safe practice guidelines. Symptom Tier reviewed.  Encouraged to monitor for any worsening symptoms; watch for increased shortness of breath, weakness, and signs of dehydration. Advised when to seek emergency care.  Instructed to rest and hydrate well.  Advised to leave the house during recommended isolation period, only if it is necessary to seek medical care

## 2021-01-12 NOTE — Progress Notes (Signed)
Virtual telephone visit    Virtual Visit via Telephone Note   This visit type was conducted due to national recommendations for restrictions regarding the COVID-19 Pandemic (e.g. social distancing) in an effort to limit this patient's exposure and mitigate transmission in our community. Due to his co-morbid illnesses, this patient is at least at moderate risk for complications without adequate follow up. This format is felt to be most appropriate for this patient at this time. The patient did not have access to video technology or had technical difficulties with video requiring transitioning to audio format only (telephone). Physical exam was limited to content and character of the telephone converstion. CMA was able to get the patient set up on a telephone visit.   Patient location: home Patient and provider in visit Provider location: Office  I discussed the limitations of evaluation and management by telemedicine and the availability of in person appointments. The patient expressed understanding and agreed to proceed.   Visit Date: 01/12/2021  Today's healthcare provider: Nance Pear, NP     Subjective:    [name], male    DOB: 1956/04/03, 65 y.o.   MRN: [id number]  Chief Complaint  Patient presents with  . Covid Positive    Tested positive on 01-09-21  . Nasal Congestion    Complains of nasal congestion  . Fatigue    Complains of feeling fatigue    HPI Patient is in today for COVID 19 infection. He started having symptoms Friday 01/09/21 (hoarseness, sore throat, nasal congestion). He tested positive for COVID on 01/09/21.  He was started on Paxlovid by our outpatient oral covid treatment team on 01/10/2019.   He denies SOB. Mild cough.  + Fatigue.  Wife is also positive for covid. He has not had his second booster.    Past Medical History:  Diagnosis Date  . Anemia 11/16/2014  . Bladder disease    "an unusual bladder disease; don't know what  it's called" (06/30/2015)  . Bulging lumbar disc   . GERD (gastroesophageal reflux disease)   . History of hiatal hernia   . Hyperglycemia 11/16/2014  . Hyperlipemia   . Hypertension   . Joint pain, knee   . Migraine    06/30/2015 "maybe 2-3 times/year; not as bad as I used to have them"  . OSA (obstructive sleep apnea)     Past Surgical History:  Procedure Laterality Date  . ESOPHAGOGASTRODUODENOSCOPY    . ESOPHAGOGASTRODUODENOSCOPY (EGD) WITH ESOPHAGEAL DILATION  ~ 2014  . KNEE ARTHROSCOPY Right 1995  . NASAL SEPTUM SURGERY  ~ 1972  . TOTAL KNEE ARTHROPLASTY  10/11/2011   Procedure: TOTAL KNEE ARTHROPLASTY;  Surgeon: Rudean Haskell, MD;  Location: [place];  Service: Orthopedics;  Laterality: Right;  . 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 Stroke Mother 52       Died of CVA  . Hypertension Mother   . Anesthesia problems Neg Hx   . Hypotension Neg Hx   . Malignant hyperthermia Neg Hx   . Pseudochol deficiency Neg Hx     Social History   Socioeconomic History  . Marital status: Married    Spouse name: Not on file  . Number of children: 3  . Years of education: Not on file  . Highest education level: Not on file  Occupational History  . Not on file  Tobacco Use  .  Smoking status: Former Smoker    Packs/day: 1.50    Years: 6.00    Pack years: 9.00    Types: Cigarettes  . Smokeless tobacco: Never Used  . Tobacco comment: "stopped smoking in ~ 1990"  Substance and Sexual Activity  . Alcohol use: No    Alcohol/week: 0.0 standard drinks  . Drug use: No  . Sexual activity: Yes  Other Topics Concern  . 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 albuterol (PROVENTIL HFA;VENTOLIN HFA) 108 (90 Base) MCG/ACT inhaler Inhale 2 puffs into the lungs every 6 (six) hours as needed for wheezing or shortness of breath. 1 Inhaler 2  . albuterol (PROVENTIL) (2.5 MG/3ML) 0.083% nebulizer solution Take 3 mLs (2.5 mg total) by nebulization every 6 (six) hours as needed for wheezing or shortness of breath. 150 mL 1  . amLODipine-benazepril (LOTREL) 10-40 MG capsule Take 1 capsule by mouth daily. 90 capsule 3  . b complex vitamins tablet Take 1 tablet by mouth daily.    . ferrous sulfate 325 (65 FE) MG tablet Take 1 tablet (325 mg total) by mouth 2 (two) times daily with a meal. 60 tablet 0  . fexofenadine (ALLEGRA) 180 MG tablet Take 1 tablet (180 mg total) by mouth daily. 30 tablet 0  . furosemide (LASIX) 20 MG tablet TAKE 1 TABLET(20 MG) BY MOUTH DAILY 90 tablet 1  . hydrochlorothiazide (HYDRODIURIL) 25 MG tablet TAKE 1 TABLET(25 MG) BY MOUTH DAILY 90 tablet 1  . ketoconazole (NIZORAL) 2 % cream Apply 1 application topically daily. 30 g 0  . metoprolol succinate (TOPROL-XL) 50 MG 24 hr tablet TAKE 1 TABLET(50 MG) BY MOUTH DAILY WITH OR IMMEDIATELY FOLLOWING A MEAL 90 tablet 1  . Multiple Vitamin (MULTIVITAMIN WITH MINERALS) TABS tablet Take 1 tablet by mouth daily.    . Nebulizers (VIOS AEROSOL DELIVERY SYSTEM) MISC 1 each as directed.  0  . nirmatrelvir/ritonavir EUA (PAXLOVID) TABS Take 3 tablets by mouth 2 (two) times daily. 30 tablet 0  . omeprazole (PRILOSEC) 40 MG capsule Take 1 capsule (40 mg total) by mouth daily. 90 capsule 0  . potassium chloride (KLOR-CON) 10 MEQ tablet TAKE 2 TABLETS(20 MEQ) BY MOUTH DAILY 180 tablet 1  . sertraline (ZOLOFT) 100 MG tablet TAKE 1 TABLET(100 MG) BY MOUTH DAILY 90 tablet 0   No  facility-administered medications prior to visit.    Not on File  ROS     Objective:    Physical Exam Constitutional:      Appearance: Normal appearance.  HENT:     Head:     Comments: Voice hoarseness noted Neurological:     Mental Status: He is alert.  Psychiatric:        Attention and Perception: Attention and perception normal.        Mood and Affect: Mood normal.        Speech: Speech normal.        Behavior: Behavior normal.  BP 133/78  Wt Readings from Last 3 Encounters:  12/29/20 250 lb (113.4 kg)  12/22/20 255 lb (115.7 kg)  09/23/20 256 lb 3.2 oz (116.2 kg)    Diabetic Foot Exam - Simple   No data filed    Lab Results  Component Value Date   WBC 6.3 03/17/2020   HGB 14.2 03/17/2020   HCT 41.5 03/17/2020   PLT 234.0 03/17/2020   GLUCOSE 90 09/23/2020   CHOL 171 03/17/2020   TRIG 213.0 (H) 03/17/2020   HDL 35.00 (L) 03/17/2020   LDLDIRECT 114.0 03/17/2020   LDLCALC 139 (H) 06/30/2015   ALT 29 09/23/2020   AST 21 09/23/2020   NA 140 09/23/2020   K 3.7 09/23/2020   CL 101 09/23/2020   CREATININE 0.93 09/23/2020   BUN 20 09/23/2020   CO2 34 (H) 09/23/2020   TSH 3.61 05/03/2016   PSA 0.57 03/17/2020   INR 0.90 10/01/2011   HGBA1C 5.5 09/23/2020    Lab Results  Component Value Date   TSH 3.61 05/03/2016   Lab Results  Component Value Date   WBC 6.3 03/17/2020   HGB 14.2 03/17/2020   HCT 41.5 03/17/2020   MCV 91.6 03/17/2020   PLT 234.0 03/17/2020   Lab Results  Component Value Date   NA 140 09/23/2020   K 3.7 09/23/2020   CO2 34 (H) 09/23/2020   GLUCOSE 90 09/23/2020   BUN 20 09/23/2020   CREATININE 0.93 09/23/2020   BILITOT 0.4 09/23/2020   ALKPHOS 75 09/23/2020   AST 21 09/23/2020   ALT 29 09/23/2020   PROT 6.4 09/23/2020   ALBUMIN 4.1 09/23/2020   CALCIUM 9.6 09/23/2020   ANIONGAP 9 10/25/2017   GFR 85.98 09/23/2020   Lab Results  Component Value Date   CHOL 171 03/17/2020   Lab Results  Component Value Date    HDL 35.00 (L) 03/17/2020   Lab Results  Component Value Date   LDLCALC 139 (H) 06/30/2015   Lab Results  Component Value Date   TRIG 213.0 (H) 03/17/2020   Lab Results  Component Value Date   CHOLHDL 5 03/17/2020   Lab Results  Component Value Date   HGBA1C 5.5 09/23/2020       Assessment & Plan:   Problem List Items Addressed This Visit   None     I am having Chancellor S. Branscum "Tim" maintain his b complex vitamins, multivitamin with minerals, omeprazole, albuterol, Vios Aerosol Delivery System, ferrous sulfate, fexofenadine, albuterol, amLODipine-benazepril, ketoconazole, furosemide, potassium chloride, hydrochlorothiazide, metoprolol succinate, sertraline, and nirmatrelvir/ritonavir EUA.  No orders of the defined types were placed in this encounter.    I discussed the assessment and treatment plan with the patient. The patient was provided an opportunity to ask questions and all were answered. The patient agreed with the plan and demonstrated an understanding of the instructions.   The patient was advised to call back or seek an in-person evaluation if the symptoms worsen or if the condition fails to improve as anticipated.  I provided 11 minutes of non-face-to-face time during this encounter.   Nance Pear, NP [facility] [facility] [phone] (phone) [phone] (fax)  [place]

## 2021-01-21 DIAGNOSIS — G4733 Obstructive sleep apnea (adult) (pediatric): Secondary | ICD-10-CM | POA: Diagnosis not present

## 2021-02-03 DIAGNOSIS — M5416 Radiculopathy, lumbar region: Secondary | ICD-10-CM | POA: Diagnosis not present

## 2021-02-11 ENCOUNTER — Telehealth: Payer: Self-pay | Admitting: Psychiatry

## 2021-02-11 DIAGNOSIS — G4733 Obstructive sleep apnea (adult) (pediatric): Secondary | ICD-10-CM | POA: Diagnosis not present

## 2021-02-11 NOTE — Telephone Encounter (Signed)
There a couple of matters to consider in his situation.  The first is that he stopped Abilify mid April and is at risk of a relapse of depression from having done so.  The second issue is the stress that has seemed to trigger the current relapse of depression.  Usually grief does not respond to medication changes.  Grief is something that has to be worked through.  However in his case I think it is reasonable to try a medicine change.  He was concerned about the cost of Abilify although the generic can be purchased for a reasonable cost through [facility].  However given that cost concern we will increase sertraline to 1-1/2 of the 100 mg tablets.  If he does not feel better within a couple of weeks or so call us back and we will get him back on Abilify using [facility].

## 2021-02-11 NOTE — Telephone Encounter (Signed)
Jeremy [name] called wanting to get a sooner appointment but nothing is available before his appointment Apr 02, 2021. He said he had a death in the family and his medication is not helping him so wants to discuss what he can do to help.  Please call.

## 2021-02-11 NOTE — Telephone Encounter (Signed)
Pt stated he had some increased anxiety and depression.He thinks he needs to increase his zoloft or add something for his symptoms during this time

## 2021-02-11 NOTE — Telephone Encounter (Signed)
Pt informed

## 2021-02-12 NOTE — Telephone Encounter (Signed)
Reviewed

## 2021-02-25 ENCOUNTER — Other Ambulatory Visit (HOSPITAL_COMMUNITY): Payer: Self-pay | Admitting: Neurological Surgery

## 2021-02-25 DIAGNOSIS — M5416 Radiculopathy, lumbar region: Secondary | ICD-10-CM

## 2021-03-01 ENCOUNTER — Other Ambulatory Visit: Payer: Self-pay | Admitting: Psychiatry

## 2021-03-01 ENCOUNTER — Other Ambulatory Visit: Payer: Self-pay | Admitting: Family

## 2021-03-01 DIAGNOSIS — F4001 Agoraphobia with panic disorder: Secondary | ICD-10-CM

## 2021-03-01 DIAGNOSIS — F333 Major depressive disorder, recurrent, severe with psychotic symptoms: Secondary | ICD-10-CM

## 2021-03-01 DIAGNOSIS — F411 Generalized anxiety disorder: Secondary | ICD-10-CM

## 2021-03-05 ENCOUNTER — Ambulatory Visit (HOSPITAL_COMMUNITY)
Admission: RE | Admit: 2021-03-05 | Discharge: 2021-03-05 | Disposition: A | Discharge status: Home / Self Care | Payer: Medicare Other | Source: Ambulatory Visit | Attending: Neurological Surgery | Admitting: Neurological Surgery

## 2021-03-05 ENCOUNTER — Other Ambulatory Visit: Payer: Self-pay

## 2021-03-05 DIAGNOSIS — M5416 Radiculopathy, lumbar region: Secondary | ICD-10-CM | POA: Diagnosis not present

## 2021-03-05 DIAGNOSIS — M545 Low back pain, unspecified: Secondary | ICD-10-CM | POA: Diagnosis not present

## 2021-03-10 ENCOUNTER — Other Ambulatory Visit: Payer: Self-pay

## 2021-03-10 ENCOUNTER — Encounter (INDEPENDENT_AMBULATORY_CARE_PROVIDER_SITE_OTHER): Payer: Medicare Other | Admitting: Ophthalmology

## 2021-03-10 DIAGNOSIS — H43813 Vitreous degeneration, bilateral: Secondary | ICD-10-CM

## 2021-03-10 DIAGNOSIS — H318 Other specified disorders of choroid: Secondary | ICD-10-CM

## 2021-03-10 DIAGNOSIS — H35033 Hypertensive retinopathy, bilateral: Secondary | ICD-10-CM

## 2021-03-10 DIAGNOSIS — I10 Essential (primary) hypertension: Secondary | ICD-10-CM | POA: Diagnosis not present

## 2021-03-10 DIAGNOSIS — D3132 Benign neoplasm of left choroid: Secondary | ICD-10-CM

## 2021-03-16 ENCOUNTER — Other Ambulatory Visit: Payer: Self-pay | Admitting: Neurological Surgery

## 2021-03-23 ENCOUNTER — Encounter: Payer: Medicare Other | Admitting: Family

## 2021-03-24 ENCOUNTER — Other Ambulatory Visit: Payer: Self-pay

## 2021-03-24 ENCOUNTER — Encounter: Payer: Self-pay | Admitting: Psychiatry

## 2021-03-24 ENCOUNTER — Ambulatory Visit: Payer: Medicare Other | Admitting: Psychiatry

## 2021-03-24 DIAGNOSIS — F333 Major depressive disorder, recurrent, severe with psychotic symptoms: Secondary | ICD-10-CM | POA: Diagnosis not present

## 2021-03-24 DIAGNOSIS — F411 Generalized anxiety disorder: Secondary | ICD-10-CM | POA: Diagnosis not present

## 2021-03-24 DIAGNOSIS — F4001 Agoraphobia with panic disorder: Secondary | ICD-10-CM

## 2021-03-24 MED ORDER — ARIPIPRAZOLE 10 MG PO TABS
10.0000 mg | ORAL_TABLET | Freq: Every day | ORAL | 0 refills | Status: DC
Start: 2021-03-24 — End: 2021-06-08

## 2021-03-24 NOTE — Progress Notes (Signed)
Jeremy [name] [id number] 13-Nov-1955 65 y.o.     Subjective:   Patient ID:  Jeremy [name] is a 65 y.o. (DOB October 20, 1955) male.  Chief Complaint:  Chief Complaint  Patient presents with   Follow-up   Depression   Anxiety    Depression        Associated symptoms include no decreased concentration and no suicidal ideas.  Past medical history includes anxiety.   Anxiety Patient reports no chest pain, confusion, decreased concentration, nervous/anxious behavior, palpitations, shortness of breath or suicidal ideas.     Jeremy [name] presents  today for follow-up of anxiety and depression.   He had been out on disability due to his back since September at that time.  seen April 2020.  Since retirement he has been under less stress and had asked to reduce and hopefully eliminate some medications.  We reduced Abilify from 7.5 to 5 mg daily.  As of December 04, 2019 the following is noted: Still good. nOTHing worse with dose reduction.   Benefit from meds. Help depression, anxiety and anger.  They are all under control. No mood swings nor prolonged depression.  Anxiety OK.  Wants to try to reduce meds again if possible bc less stress as noted. Plan because of sexual side effects with sertraline he prefers to reduce sertraline to 50 mg daily and continue Abilify 5 mg daily.  02/21/2020 appointment with the following noted: Doing well still after reducing sertraline to 50 mg daily but no improvement in sexual function.  depression and anxiety  Are still under control. No panic.   Plan:  We discussed the options of trying to eliminate gradually the Abilify versus reducing the sertraline which is probably more risky.  However he is having sexual side effects from sertraline and no side effects from Abilify.  He prefers to stop the sertraline and see if he can get by without it. Therefore reduce sertraline to 25 mg daily for 2 weeks then stop it  05/07/20 appt with the following noted: Off  sertraline without more depression or anxiety.  No panic.  No withdrawal effects. Sexual function is not better.  Erection problems. Tried Viagra without help.  Saw urologist and had shots which helped then stopped helping. He wants to try stopping Abilify to see if sexual function will return. Plan: Since retiring his stress level has reduced significantly.  His depression and anxiety are under control.  He wants to try to reduce medications again.   He wants to try DC Abilify to see if sexual function is better despite risk relapse. He's been fine so far off sertraline.    06/02/2020 phone call from patient:Pt called and said that he had a panic attack yesterday. He wants to know if he can go back on his medications MD response: Fairly recently discontinued sertraline 50 mg daily.  That is the best antipanic med.  Have him restart sertraline 25 mg daily for 7 days and then 50 mg daily.  Will take several weeks to help.  Hold off the Abilify bc that was for depression. 06/13/20 TC: Pt LM on VM needing apt today to see CC. Returned call, Pt stated he is still having issues with anxiety and needs to see CC today. His follow up apt is 12/13.   2 panic attacks but close today.  06/16/2020 urgent appointment with the following noted: Seen with wife Panic and wanted to restart meds.  Wife says he's more depressed.  Only sleeps and  eats and is like a walking zombie without meds. Lost father and stepmother and friend.  She thinks he's depressed.  B committed suicide years ago.   Now he thinks I'm going to leave him. Family notices his depression.  Having to help adopted father with cancer. He agrees with wife. Tense and SOB with anxiety. Wife thinks he represses things.  Mo left him in an orphanage and has abandonment issues even with her.  Thinking she will leave. Plan: Relapse worse off meds  Increase sertraline to 100 mg daily.  May need to go higher Increase Abilify to 10 mg daily for severe  depression with paranoid rumination. OK prn Xanax prn panic   08/04/20 appt with following noted:  Seen with wife, [name] Better with tension.  Feels less down too.   SE increased appetite.  But had recently lost 20 # and lately snacking more. Wife sees dramatic turnaround and he's more talkative and engaged and friends notice too.  It's working. No Xanax used.  Staying awake during day.   Not quite back to normal but really improved per wife.   Plan: If he is back to baseline at follow-up we will consider reducing the Abilify somewhat in order to reduce the weight gain potential if possible.  10/07/2020 appointment with following noted: Still sleeping too much and eating a lot.  Otherwise is OK.  Doesn't feel depressed.  Interest is a little better.  Not anxious.    Not aware of SE otherwise.  Not worrying too much.  Has retired.  Has reduced his stress significantly. Using CPAP. No panic. Plan: Continue sertraline 100 mg daily Reduce Abilify to 5 mg daily for the history of severe paranoid rumination and depression which are improved, but apparently it's sedating him.  12/11/2020 appointment with the following noted: Reduced Abilify to 5 mg daily without much change.   Mood been alright withuot depression.  Anxiety is not a problem. Sleep 9 hours at night and then naps in the morning after breakfast and then in afternoon.  Not much to do.  Not much he wants to do in retirement except golf and physical limitations hurt that. No other hobbies.     Weight stable.  He stopped the Abilify couple of weeks ago bc insurance told him they would not cover it and it would be $800 dollars. Plan:  Continue sertraline 100 mg daily He stopped Abilify 5 mg 2 weeks ago DT cost Disc risk relapse.  Call if relapse bc at risk for a couple of mos.  03/24/2021 appointment with the following noted:  Seen with daughter, Delsa Sale Patient stopped Abilify 2 weeks prior to last appointment due to being told it would be  $800.  He wanted to stay off the medication and was doing okay at the time of appointment. Taking sertraline 150 mg daily. Xanax makes him too sleepy. Alright.  A little bit of anxiety.  Worrying about things with wife's problems with her brother's death.   Delsa Sale notices his anxiety is out of control.  He's constantly hovering her and obsessing she may leave him again as in the past.  Definitely not himself and family notices not interacting with people.  She's noticed changes worsening over the year. B in law died 2023-02-03 making things worse. Sleep is interrupted.  He recognizes he's more fearful than normal. Is sad also. Pending back surgery 03/31/2021  Past history of psychiatric disability. Not sure how many episodes of depression he's had in life.  Historically about the same levels of depression and anxiety   Past Psychiatric Medication Trials: Sertraline 100 Abilify 10 helped Xanax 0.5  Review of Systems:  Review of Systems  Respiratory:  Negative for shortness of breath.   Cardiovascular:  Negative for chest pain and palpitations.  Genitourinary:        ED persistent  Musculoskeletal:  Positive for arthralgias and back pain.  Psychiatric/Behavioral:  Negative for agitation, behavioral problems, confusion, decreased concentration, dysphoric mood, hallucinations, self-injury, sleep disturbance and suicidal ideas. The patient is not nervous/anxious and is not hyperactive.    Medications: I have reviewed the patient's current medications.  Current Outpatient Medications  Medication Sig Dispense Refill   amLODipine-benazepril (LOTREL) 10-40 MG capsule Take 1 capsule by mouth daily. 90 capsule 3   ARIPiprazole (ABILIFY) 10 MG tablet Take 1 tablet (10 mg total) by mouth daily. 90 tablet 0   b complex vitamins tablet Take 1 tablet by mouth daily.     ferrous sulfate 325 (65 FE) MG tablet Take 1 tablet (325 mg total) by mouth 2 (two) times daily with a meal. (Patient taking differently:  Take 650 mg by mouth daily with breakfast.) 60 tablet 0   fexofenadine (ALLEGRA) 180 MG tablet Take 1 tablet (180 mg total) by mouth daily. 30 tablet 0   furosemide (LASIX) 20 MG tablet TAKE 1 TABLET(20 MG) BY MOUTH DAILY (Patient taking differently: Take 20 mg by mouth daily. TAKE 1 TABLET(20 MG) BY MOUTH DAILY) 90 tablet 1   hydrochlorothiazide (HYDRODIURIL) 25 MG tablet TAKE 1 TABLET(25 MG) BY MOUTH DAILY (Patient taking differently: Take 25 mg by mouth daily.) 90 tablet 1   ibuprofen (ADVIL) 200 MG tablet Take 400 mg by mouth every 8 (eight) hours as needed for moderate pain.     metoprolol succinate (TOPROL-XL) 50 MG 24 hr tablet TAKE 1 TABLET(50 MG) BY MOUTH DAILY WITH OR IMMEDIATELY FOLLOWING A MEAL (Patient taking differently: Take 50 mg by mouth daily.) 90 tablet 1   Multiple Vitamin (MULTIVITAMIN WITH MINERALS) TABS tablet Take 1 tablet by mouth daily.     omeprazole (PRILOSEC) 40 MG capsule Take 1 capsule (40 mg total) by mouth daily. 90 capsule 0   potassium chloride (KLOR-CON) 10 MEQ tablet TAKE 2 TABLETS(20 MEQ) BY MOUTH DAILY (Patient taking differently: Take 20 mEq by mouth daily. TAKE 2 TABLETS(20 MEQ) BY MOUTH DAILY) 180 tablet 1   sertraline (ZOLOFT) 100 MG tablet TAKE 1 TABLET(100 MG) BY MOUTH DAILY (Patient taking differently: Take 100 mg by mouth daily. 1.5 tabs daily) 90 tablet 0   albuterol (PROVENTIL HFA;VENTOLIN HFA) 108 (90 Base) MCG/ACT inhaler Inhale 2 puffs into the lungs every 6 (six) hours as needed for wheezing or shortness of breath. (Patient not taking: Reported on 03/24/2021) 1 Inhaler 2   albuterol (PROVENTIL) (2.5 MG/3ML) 0.083% nebulizer solution Take 3 mLs (2.5 mg total) by nebulization every 6 (six) hours as needed for wheezing or shortness of breath. (Patient not taking: No sig reported) 150 mL 1   ketoconazole (NIZORAL) 2 % cream Apply 1 application topically daily. (Patient not taking: No sig reported) 30 g 0   naproxen sodium (ALEVE) 220 MG tablet Take 440 mg  by mouth daily as needed (pain). (Patient not taking: Reported on 03/24/2021)     Nebulizers (VIOS AEROSOL DELIVERY SYSTEM) MISC 1 each as directed. (Patient not taking: Reported on 03/24/2021)  0   No current facility-administered medications for this visit.    Medication Side Effects: ED with  Zoloft.  Viagra failed.  Using shots.  Allergies: No Known Allergies  Past Medical History:  Diagnosis Date   Anemia 11/16/2014   Bladder disease    "an unusual bladder disease; don't know what it's called" (06/30/2015)   Bulging lumbar disc    GERD (gastroesophageal reflux disease)    History of hiatal hernia    Hyperglycemia 11/16/2014   Hyperlipemia    Hypertension    Joint pain, knee    Migraine    06/30/2015 "maybe 2-3 times/year; not as bad as I used to have them"   OSA (obstructive sleep apnea)     Family History  Problem Relation Age of Onset   Stroke Mother 41       Died of CVA   Hypertension Mother    Anesthesia problems Neg Hx    Hypotension Neg Hx    Malignant hyperthermia Neg Hx    Pseudochol deficiency Neg Hx     Social History   Socioeconomic History   Marital status: Married    Spouse name: Not on file   Number of children: 3   Years of education: Not on file   Highest education level: Not on file  Occupational History   Not on file  Tobacco Use   Smoking status: Former    Packs/day: 1.50    Years: 6.00    Pack years: 9.00    Types: Cigarettes   Smokeless tobacco: Never   Tobacco comments:    "stopped smoking in ~ 1990"  Substance and Sexual Activity   Alcohol use: No    Alcohol/week: 0.0 standard drinks   Drug use: No   Sexual activity: Yes  Other Topics Concern   Not on file  Social History Narrative   3 children    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   Please see review of systems for further details on the patient's review from today.   Objective:   Physical Exam:  There were no vitals taken for this visit.  Physical Exam Constitutional:      General: He is not in acute distress.    Appearance: He is well-developed.  Musculoskeletal:        General: No deformity.  Neurological:     Mental Status: He is alert and oriented to person, place, and time.     Motor: No tremor.     Coordination: Coordination normal.     Gait: Gait normal.  Psychiatric:        Attention and Perception: Attention and perception normal.        Mood and Affect: Mood is anxious and depressed. Affect is blunt. Affect is not labile, flat, angry or inappropriate.        Speech: Speech normal.        Behavior: Behavior is not slowed.        Thought Content: Thought content is paranoid. Thought content does not include homicidal or suicidal ideation. Thought content does not include homicidal or suicidal plan.  Cognition and Memory: Cognition normal.        Judgment: Judgment normal.     Comments: Insight intact. No auditory or visual hallucinations.    Lab Review:     Component Value Date/Time   NA 140 09/23/2020 0949   K 3.7 09/23/2020 0949   CL 101 09/23/2020 0949   CO2 34 (H) 09/23/2020 0949   GLUCOSE 90 09/23/2020 0949   BUN 20 09/23/2020 0949   CREATININE 0.93 09/23/2020 0949   CREATININE 1.05 01/13/2018 1542   CALCIUM 9.6 09/23/2020 0949   PROT 6.4 09/23/2020 0949   ALBUMIN 4.1 09/23/2020 0949   AST 21 09/23/2020 0949   ALT 29 09/23/2020 0949   ALKPHOS 75 09/23/2020 0949   BILITOT 0.4 09/23/2020 0949   GFRNONAA >60 10/25/2017 0438   GFRAA >60 10/25/2017 0438        Component Value Date/Time   WBC 6.3 03/17/2020 1111   RBC 4.53 03/17/2020 1111   HGB 14.2 03/17/2020 1111   HCT 41.5 03/17/2020 1111   PLT 234.0 03/17/2020 1111   MCV 91.6 03/17/2020 1111   MCH 30.0 10/25/2017 0438   MCHC 34.3 03/17/2020 1111   RDW 13.8 03/17/2020 1111   LYMPHSABS 1.9 03/17/2020 1111   MONOABS 0.6 03/17/2020 1111   EOSABS 0.1 03/17/2020 1111   BASOSABS 0.1 03/17/2020 1111    No results found for: POCLITH, LITHIUM   No results found for: PHENYTOIN, PHENOBARB, VALPROATE, CBMZ   .res Assessment: Plan:    Severe recurrent major depression w/psychotic features, mood-congruent ([place]) - Plan: ARIPiprazole (ABILIFY) 10 MG tablet  Panic disorder with agoraphobia -- in remission  Generalized anxiety disorder Erectile 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 We discussed [name] has a history of severe depression and panic disorder and anxiety that caused him to go out on disability in 2019. He relapsed after retirement off meds. Relapsed again off Abilify  He has had good response to the combination of Zoloft and Abilify.  Relapse worse off meds. He's much too inactive in retirement.  Emphasized need to be more active in retirment otherwise health will decline including mental health.  Wife busy in wildlife rehab.   Disc getting b ack into therapy also  Continue sertraline 150 mg daily Restart Abilify 10 mg daily bc did best on it. More paranoid off Abilify Disc Good RX  Discussed potential metabolic side effects associated with atypical antipsychotics, as well as potential risk for movement side effects. Advised pt to contact office if movement side effects occur.   Follow-up 2 mos  Lynder Parents, MD, DFAPA  Please see After Visit Summary for patient specific instructions.  Future Appointments  Date Time Provider [place]  03/26/2021 11:00 AM  [facility] PAT 4 [facility] None  03/10/2022  9:15 AM Hayden Pedro, MD [facility] None    No orders of the defined types were placed in this encounter.     -------------------------------

## 2021-03-25 NOTE — Progress Notes (Signed)
Surgical Instructions    Your procedure is scheduled on 03/31/21.  Report to [facility] Main Entrance "A" at 10:30 A.M., then check in with the Admitting office.  Call this number if you have problems the morning of surgery:  [phone]   If you have any questions prior to your surgery date call [phone]: Open Monday-Friday 8am-4pm    Remember:  Do not eat after midnight the night before your surgery  You may drink clear liquids until 09:30am the morning of your surgery.   Clear liquids allowed are: Water, Non-Citrus Juices (without pulp), Carbonated Beverages, Clear Tea, Black Coffee Only, and Gatorade    Take these medicines the morning of surgery with A SIP OF WATER  ARIPiprazole (ABILIFY)  fexofenadine (ALLEGRA) metoprolol succinate (TOPROL-XL) omeprazole (PRILOSEC)  sertraline (ZOLOFT)  potassium chloride (KLOR-CON)     As of today, STOP taking any Aspirin (unless otherwise instructed by your surgeon) Aleve, Naproxen, Ibuprofen, Motrin, Advil, Goody's, BC's, all herbal medications, fish oil, and all vitamins.          Do not wear jewelry or makeup Do not wear lotions, powders, colognes, or deodorant. Men may shave face and neck. Do not bring valuables to the hospital.  DO Not wear nail polish, gel polish, artificial nails, or any other type of covering on natural nails  including finger and toenails. If patients have artificial nails, gel coating, etc. that need to be removed by a nail salon please have this removed prior to surgery or surgery may need to be canceled/delayed if the surgeon/ anesthesia feels like the patient is unable to be adequately monitored.             [facility] is not responsible for any belongings or valuables.  Do NOT Smoke (Tobacco/Vaping) or drink Alcohol 24 hours prior to your procedure If you use a CPAP at night, you may bring all equipment for your overnight stay.   Contacts, glasses, dentures or bridgework may not be worn into  surgery, please bring cases for these belongings   For patients admitted to the hospital, discharge time will be determined by your treatment team.   Patients discharged the day of surgery will not be allowed to drive home, and someone needs to stay with them for 24 hours.  ONLY 1 SUPPORT PERSON MAY BE PRESENT WHILE YOU ARE IN SURGERY. IF YOU ARE TO BE ADMITTED ONCE YOU ARE IN YOUR ROOM YOU WILL BE ALLOWED TWO (2) VISITORS.  Minor children may have two parents present. Special consideration for safety and communication needs will be reviewed on a case by case basis.  Special instructions:    Oral Hygiene is also important to reduce your risk of infection.  Remember - BRUSH YOUR TEETH THE MORNING OF SURGERY WITH YOUR REGULAR TOOTHPASTE   [place]- Preparing For Surgery  Before surgery, you can play an important role. Because skin is not sterile, your skin needs to be as free of germs as possible. You can reduce the number of germs on your skin by washing with CHG (chlorahexidine gluconate) Soap before surgery.  CHG is an antiseptic cleaner which kills germs and bonds with the skin to continue killing germs even after washing.     Please do not use if you have an allergy to CHG or antibacterial soaps. If your skin becomes reddened/irritated stop using the CHG.  Do not shave (including legs and underarms) for at least 48 hours prior to first CHG shower. It is OK to  shave your face.  Please follow these instructions carefully.     Shower the NIGHT BEFORE SURGERY and the MORNING OF SURGERY with CHG Soap.   If you chose to wash your hair, wash your hair first as usual with your normal shampoo. After you shampoo, rinse your hair and body thoroughly to remove the shampoo.  Then [employer] and genitals (private parts) with your normal soap and rinse thoroughly to remove soap.  After that Use CHG Soap as you would any other liquid soap. You can apply CHG directly to the skin and wash gently with a  scrungie or a clean washcloth.   Apply the CHG Soap to your body ONLY FROM THE NECK DOWN.  Do not use on open wounds or open sores. Avoid contact with your eyes, ears, mouth and genitals (private parts). Wash Face and genitals (private parts)  with your normal soap.   Wash thoroughly, paying special attention to the area where your surgery will be performed.  Thoroughly rinse your body with warm water from the neck down.  DO NOT shower/wash with your normal soap after using and rinsing off the CHG Soap.  Pat yourself dry with a CLEAN TOWEL.  Wear CLEAN PAJAMAS to bed the night before surgery  Place CLEAN SHEETS on your bed the night before your surgery  DO NOT SLEEP WITH PETS.   Day of Surgery: Take a shower with CHG soap. Wear Clean/Comfortable clothing the morning of surgery Do not apply any deodorants/lotions.   Remember to brush your teeth WITH YOUR REGULAR TOOTHPASTE.   Please read over the following fact sheets that you were given.

## 2021-03-26 ENCOUNTER — Encounter (HOSPITAL_COMMUNITY)
Admission: RE | Admit: 2021-03-26 | Discharge: 2021-03-26 | Disposition: A | Discharge status: Home / Self Care | Payer: Medicare Other | Source: Ambulatory Visit | Attending: Neurological Surgery | Admitting: Neurological Surgery

## 2021-03-26 ENCOUNTER — Other Ambulatory Visit: Payer: Self-pay

## 2021-03-26 ENCOUNTER — Encounter (HOSPITAL_COMMUNITY): Payer: Self-pay

## 2021-03-26 DIAGNOSIS — Z6837 Body mass index (BMI) 37.0-37.9, adult: Secondary | ICD-10-CM | POA: Insufficient documentation

## 2021-03-26 DIAGNOSIS — E669 Obesity, unspecified: Secondary | ICD-10-CM | POA: Insufficient documentation

## 2021-03-26 DIAGNOSIS — Z7901 Long term (current) use of anticoagulants: Secondary | ICD-10-CM | POA: Diagnosis not present

## 2021-03-26 DIAGNOSIS — Z01818 Encounter for other preprocedural examination: Secondary | ICD-10-CM | POA: Insufficient documentation

## 2021-03-26 DIAGNOSIS — Z79899 Other long term (current) drug therapy: Secondary | ICD-10-CM | POA: Insufficient documentation

## 2021-03-26 DIAGNOSIS — M5416 Radiculopathy, lumbar region: Secondary | ICD-10-CM | POA: Diagnosis not present

## 2021-03-26 DIAGNOSIS — K219 Gastro-esophageal reflux disease without esophagitis: Secondary | ICD-10-CM | POA: Insufficient documentation

## 2021-03-26 DIAGNOSIS — I10 Essential (primary) hypertension: Secondary | ICD-10-CM | POA: Insufficient documentation

## 2021-03-26 DIAGNOSIS — G4733 Obstructive sleep apnea (adult) (pediatric): Secondary | ICD-10-CM | POA: Diagnosis not present

## 2021-03-26 DIAGNOSIS — Z8616 Personal history of COVID-19: Secondary | ICD-10-CM | POA: Diagnosis not present

## 2021-03-26 LAB — TYPE AND SCREEN
ABO/RH(D): O POS
Antibody Screen: NEGATIVE

## 2021-03-26 LAB — CBC
HCT: 40.2 % (ref 39.0–52.0)
Hemoglobin: 13.9 g/dL (ref 13.0–17.0)
MCH: 31.6 pg (ref 26.0–34.0)
MCHC: 34.6 g/dL (ref 30.0–36.0)
MCV: 91.4 fL (ref 80.0–100.0)
Platelets: 299 10*3/uL (ref 150–400)
RBC: 4.4 MIL/uL (ref 4.22–5.81)
RDW: 13.4 % (ref 11.5–15.5)
WBC: 8.8 10*3/uL (ref 4.0–10.5)
nRBC: 0 % (ref 0.0–0.2)

## 2021-03-26 LAB — BASIC METABOLIC PANEL
Anion gap: 9 (ref 5–15)
BUN: 17 mg/dL (ref 8–23)
CO2: 30 mmol/L (ref 22–32)
Calcium: 9.3 mg/dL (ref 8.9–10.3)
Chloride: 102 mmol/L (ref 98–111)
Creatinine, Ser: 0.91 mg/dL (ref 0.61–1.24)
GFR, Estimated: >60 mL/min (ref >60)
Glucose, Bld: 95 mg/dL (ref 70–99)
Potassium: 3.2 mmol/L — ABNORMAL LOW (ref 3.5–5.1)
Sodium: 141 mmol/L (ref 135–145)

## 2021-03-26 LAB — SURGICAL PCR SCREEN
MRSA, PCR: NEGATIVE
Staphylococcus aureus: NEGATIVE

## 2021-03-26 NOTE — Progress Notes (Signed)
PCP - [name], NP Cardiologist - Angelena Form, PA-C  PPM/ICD - denies Device Orders - N/A Rep Notified - N/A  Chest x-ray - N/A EKG - 03/26/2021 Stress Test - 11/10/2017 ECHO - 10/23/2017 Cardiac Cath - denies  Sleep Study - yes CPAP - yes, between 12 and 17  Fasting Blood Sugar - N/A  Blood Thinner Instructions: N/A Aspirin Instructions: Patient was instructed: As of today, STOP taking any Aspirin (unless otherwise instructed by your surgeon) Aleve, Naproxen, Ibuprofen, Motrin, Advil, Goody's, BC's, all herbal medications, fish oil, and all vitamins  ERAS Protcol - No   COVID TEST- patient was instructed to go for test on 03/27/2021  Anesthesia review: yes; cardiac history  Patient denies shortness of breath, fever, cough and chest pain at PAT appointment   All instructions explained to the patient, with a verbal understanding of the material. Patient agrees to go over the instructions while at home for a better understanding. Patient also instructed to self quarantine after being tested for COVID-19. The opportunity to ask questions was provided.

## 2021-03-27 ENCOUNTER — Encounter (HOSPITAL_COMMUNITY): Payer: Self-pay

## 2021-03-27 ENCOUNTER — Other Ambulatory Visit: Payer: Self-pay | Admitting: Neurological Surgery

## 2021-03-27 ENCOUNTER — Encounter: Payer: Self-pay | Admitting: Family

## 2021-03-27 ENCOUNTER — Telehealth: Payer: Self-pay | Admitting: Family

## 2021-03-27 DIAGNOSIS — I712 Thoracic aortic aneurysm, without rupture, unspecified: Secondary | ICD-10-CM

## 2021-03-27 DIAGNOSIS — I719 Aortic aneurysm of unspecified site, without rupture: Secondary | ICD-10-CM | POA: Insufficient documentation

## 2021-03-27 LAB — SARS CORONAVIRUS 2 (TAT 6-24 HRS): SARS Coronavirus 2: NEGATIVE

## 2021-03-27 NOTE — Progress Notes (Signed)
Per progress note, patient tested positive for covid 19 on 01/09/21.

## 2021-03-27 NOTE — Anesthesia Preprocedure Evaluation (Addendum)
Anesthesia Evaluation  Patient identified by MRN, date of birth, ID band Patient awake    Reviewed: Allergy & Precautions, NPO status , Patient's Chart, lab work & pertinent test results, reviewed documented beta blocker date and time   Airway Mallampati: I  TM Distance: >3 FB Neck ROM: Full    Dental no notable dental hx. (+) Teeth Intact, Dental Advisory Given   Pulmonary sleep apnea and Continuous Positive Airway Pressure Ventilation , former smoker,    Pulmonary exam normal breath sounds clear to auscultation       Cardiovascular hypertension, Pt. on medications and Pt. on home beta blockers pulmonary hypertension (mild)+CHF  Normal cardiovascular exam Rhythm:Regular Rate:Normal  TTE 2019 - Left ventricle: The cavity size was normal. There was moderate  concentric hypertrophy. Systolic function was normal. The  estimated ejection fraction was in the range of 60% to 65%. Wall  motion was normal; there were no regional wall motion  abnormalities. The deceleration time of the early transmitral  flow velocity was normal. Left ventricular diastolic function  parameters were normal.  - Aortic valve: There was trivial regurgitation.  - Aorta: Ascending aorta diameter: 43 mm (ED).  - Ascending aorta: The ascending aorta was mildly dilated.  - Mitral valve: Calcified annulus. Mild focal calcification of the  anterior leaflet.  - Right ventricle: The cavity size was moderately dilated. Wall  thickness was normal.  - Pulmonary arteries: PA peak pressure: 38 mm Hg (S).  Stress Test 2019 Normal resting and stress perfusion. No ischemia or infarction EF 61%    Neuro/Psych  Headaches, PSYCHIATRIC DISORDERS Anxiety Depression    GI/Hepatic Neg liver ROS, hiatal hernia, GERD  ,  Endo/Other  negative endocrine ROS  Renal/GU negative Renal ROS  negative genitourinary   Musculoskeletal negative musculoskeletal  ROS (+)   Abdominal   Peds  Hematology negative hematology ROS (+)   Anesthesia Other Findings   Reproductive/Obstetrics                           Anesthesia Physical Anesthesia Plan  ASA: 3  Anesthesia Plan: General   Post-op Pain Management:    Induction: Intravenous  PONV Risk Score and Plan: 2 and Midazolam, Dexamethasone and Ondansetron  Airway Management Planned: Oral ETT  Additional Equipment:   Intra-op Plan:   Post-operative Plan: Extubation in OR  Informed Consent: I have reviewed the patients History and Physical, chart, labs and discussed the procedure including the risks, benefits and alternatives for the proposed anesthesia with the patient or authorized representative who has indicated his/her understanding and acceptance.     Dental advisory given  Plan Discussed with: CRNA  Anesthesia Plan Comments: (PAT note written 03/27/2021 by Myra Gianotti, PA-C. )       Anesthesia Quick Evaluation

## 2021-03-27 NOTE — Progress Notes (Signed)
Anesthesia Chart Review:  Case: [id number] Date/Time: 03/31/21 1210   Procedure: Right Lumbar 5 Sacral 1 Minimally invasive transforaminal lumbar interbody fusion (Right) - 3C/RM 20   Anesthesia type: General   Pre-op diagnosis: Lumbar radiculopathy   Location: [facility] OR ROOM 20 / [place] [facility]   Surgeons: Judith Part, MD       DISCUSSION: Patient is a 65 year old male scheduled for the above procedure.  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 BMI is consistent with obesity.   Appears at PAT, he was told to get preoperative COVID-19 testing on 03/27/21, but PCP [name], NP note from 01/12/21 indicate that he tested positive for COVID-19 on 01/09/21 and was treated with Paxlovid. I have notified our RN flow coordinator.   Normal stress test in 10/2017.  Echo then showed normal LVEF, no significant valvular abnormalities, mild pulmonary hypertension. He also had mildly dilated ascending aorta (41 mm) by CT which  10/21/17 CT (patient notified per ED provider documentation). As needed cardiology follow-up recommended at 03/07/2018 visit with Dr. Percival Spanish.  He does use CPAP for OSA.   Preoperative EKG stable with NSR, non-specific ST abnormality. Labs show mild hypokalemia at 3.2. Anesthesia team to evaluate on the day of surgery.   VS: BP 131/80   Pulse 66   Temp 36.9 C (Oral)   Resp 18   Ht '5\' 6"'$  (1.676 m)   Wt 106.4 kg   SpO2 97%   BMI 37.87 kg/m    PROVIDERS: [name], NP is PCP  - Kara Mead, MD is pulmonologist. Last visit 01/08/19.  Minus Breeding, MD  is cardiologist. Last visit 03/07/18 for history of chest pain with normal stress test. Mild pulmonary hypertension by echo. Breathing stable. Able to push a lawn mover. Continue risk factor modification with as needed cardiology follow-up recommended.  Clovis Pu, Fayne Mediate, MD is psychiatrist   LABS: Labs reviewed: Acceptable for surgery. A1c 5.5% 09/24/20. (all labs ordered are listed, but only abnormal results are displayed)  Labs Reviewed  BASIC METABOLIC PANEL - Abnormal; Notable for the following components:      Result Value   Potassium 3.2 (*)    All other components within normal limits  SURGICAL PCR SCREEN  CBC  TYPE AND SCREEN   09/16/2017 spirometry uninterpretable due to poor test quality.   IMAGES: MRI L-spine 03/05/21: IMPRESSION: - L2-3: Multifactorial spinal stenosis, moderate in degree, possibly slightly worsened since 2019. Neural compression could occur at this level. - L5-S1: Chronic bilateral pars defects with 2 mm of anterolisthesis. Circumferential bulging of the disc. Stenosis of both neural foramina that could affect either or both exiting L5 nerves. Similar appearance to the study of 2019. - Lesser, grossly non-compressive degenerative changes at L3-4 and L4-5, stable since 2019.   EKG: 03/26/21: Normal sinus rhythm Nonspecific ST abnormality Abnormal ECG No significant change since last tracing Confirmed by Jackson, Will [phone]) on 03/26/2021 1:09:33 PM   CV: Echo 10/23/17: Study Conclusions  - Left ventricle: The cavity size was normal. There was moderate    concentric hypertrophy. Systolic function was normal. The    estimated ejection fraction was in the range of  60% to 65%. Wall    motion was normal; there were no regional wall motion    abnormalities. The deceleration time of the early transmitral    flow velocity was normal. Left ventricular diastolic function    parameters were normal.  - Aortic valve: There was trivial regurgitation.  -  Aorta: Ascending aorta diameter: 43 mm (ED).  - Ascending aorta: The ascending aorta was mildly dilated.  - Mitral valve: Calcified annulus. Mild focal calcification of the    anterior leaflet.  - Right ventricle: The cavity size was moderately dilated. Wall    thickness was normal.  - Pulmonary arteries: PA peak pressure: 38 mm Hg (S).  Impressions:  - The right ventricular systolic pressure was increased consistent    with mild pulmonary hypertension.  - Ascending aorta 4.1 cm on  10/21/17 chest CTA which was discussed with patient per ED provider notes. Staff message sent to [name], NP for future follow-up purposes.    Nuclear stress test 11/10/17: The left ventricular ejection fraction is normal (55-65%). Nuclear stress EF: 61%. Blood pressure demonstrated a normal response to exercise. There was no ST segment deviation noted during stress. The study is normal. This is a low risk study. Normal resting and stress perfusion. No ischemia or infarction EF 61%  Past Medical History:  Diagnosis Date   Anemia 11/16/2014   Anxiety    Bladder disease    "an unusual bladder disease; don't know what it's called" (06/30/2015)   Bulging lumbar disc    Depression    GERD (gastroesophageal reflux disease)    History of hiatal hernia    Hyperglycemia 11/16/2014   Hyperlipemia    Hypertension    Joint pain, knee    Migraine    06/30/2015 "maybe 2-3 times/year; not as bad as I used to have them"   OSA (obstructive sleep apnea)     Past Surgical History:  Procedure Laterality Date   ESOPHAGOGASTRODUODENOSCOPY     ESOPHAGOGASTRODUODENOSCOPY (EGD) WITH ESOPHAGEAL DILATION  ~ 2014   JOINT REPLACEMENT     right knee   KNEE ARTHROSCOPY Right 08/23/1993   NASAL SEPTUM SURGERY  ~ 1972   TOTAL KNEE ARTHROPLASTY  10/11/2011   Procedure: TOTAL KNEE ARTHROPLASTY;  Surgeon: Rudean Haskell, MD;  Location: [place];  Service: Orthopedics;  Laterality: Right;   TRANSURETHRAL RESECTION OF  BLADDER TUMOR WITH GYRUS (TURBT-GYRUS)  ~2014    MEDICATIONS:  albuterol (PROVENTIL HFA;VENTOLIN HFA) 108 (90 Base) MCG/ACT inhaler   albuterol (PROVENTIL) (2.5 MG/3ML) 0.083% nebulizer solution   amLODipine-benazepril (LOTREL) 10-40 MG capsule   ARIPiprazole (ABILIFY) 10 MG tablet   b complex vitamins tablet   ferrous sulfate 325 (65 FE) MG tablet   fexofenadine (ALLEGRA) 180 MG tablet   furosemide (LASIX) 20 MG tablet   hydrochlorothiazide (HYDRODIURIL) 25 MG tablet   ibuprofen (ADVIL) 200 MG tablet   ketoconazole (NIZORAL) 2 % cream   metoprolol succinate (TOPROL-XL) 50 MG 24 hr tablet   Multiple Vitamin (MULTIVITAMIN WITH MINERALS) TABS tablet   naproxen sodium (ALEVE) 220 MG tablet   Nebulizers (VIOS AEROSOL DELIVERY SYSTEM) MISC   omeprazole (PRILOSEC) 40 MG capsule   potassium chloride (KLOR-CON) 10 MEQ tablet   sertraline (ZOLOFT) 100 MG tablet   No current facility-administered medications for this encounter.    Myra Gianotti, PA-C Surgical Short Stay/Anesthesiology [facility] Phone [phone] [facility] Phone [phone] 03/27/2021 3:09 PM

## 2021-03-27 NOTE — Telephone Encounter (Signed)
Please advise pt that I reviewed his chart and see that he is due for a follow up CT of his chest to follow some widening of his aorta noted on previous CT a few years back.  Order has been placed.

## 2021-03-30 NOTE — Telephone Encounter (Signed)
Patient advised he will be set up for CT scan to follow up on previous findings. He verbalized understanding and informed he was having back surgery tomorrow.

## 2021-03-31 ENCOUNTER — Encounter (HOSPITAL_COMMUNITY): Payer: Self-pay | Admitting: Neurological Surgery

## 2021-03-31 ENCOUNTER — Inpatient Hospital Stay (HOSPITAL_COMMUNITY): Payer: Medicare Other | Admitting: Anesthesiology

## 2021-03-31 ENCOUNTER — Inpatient Hospital Stay (HOSPITAL_COMMUNITY): Payer: Medicare Other

## 2021-03-31 ENCOUNTER — Encounter (HOSPITAL_COMMUNITY)
Admission: RE | Disposition: A | Discharge status: Home / Self Care | Payer: Self-pay | Source: Home / Self Care | Attending: Neurological Surgery

## 2021-03-31 ENCOUNTER — Inpatient Hospital Stay (HOSPITAL_COMMUNITY)
Admission: RE | Admit: 2021-03-31 | Discharge: 2021-04-01 | DRG: 460 | Disposition: A | Discharge status: Home / Self Care | Payer: Medicare Other | Attending: Neurological Surgery | Admitting: Neurological Surgery

## 2021-03-31 ENCOUNTER — Other Ambulatory Visit: Payer: Self-pay

## 2021-03-31 ENCOUNTER — Inpatient Hospital Stay (HOSPITAL_COMMUNITY): Payer: Medicare Other | Admitting: Vascular Surgery

## 2021-03-31 DIAGNOSIS — F419 Anxiety disorder, unspecified: Secondary | ICD-10-CM | POA: Diagnosis not present

## 2021-03-31 DIAGNOSIS — Z823 Family history of stroke: Secondary | ICD-10-CM

## 2021-03-31 DIAGNOSIS — G43909 Migraine, unspecified, not intractable, without status migrainosus: Secondary | ICD-10-CM | POA: Diagnosis not present

## 2021-03-31 DIAGNOSIS — Z8249 Family history of ischemic heart disease and other diseases of the circulatory system: Secondary | ICD-10-CM | POA: Diagnosis not present

## 2021-03-31 DIAGNOSIS — M4326 Fusion of spine, lumbar region: Secondary | ICD-10-CM | POA: Diagnosis not present

## 2021-03-31 DIAGNOSIS — Z8616 Personal history of COVID-19: Secondary | ICD-10-CM

## 2021-03-31 DIAGNOSIS — E785 Hyperlipidemia, unspecified: Secondary | ICD-10-CM | POA: Diagnosis not present

## 2021-03-31 DIAGNOSIS — M4317 Spondylolisthesis, lumbosacral region: Secondary | ICD-10-CM | POA: Diagnosis not present

## 2021-03-31 DIAGNOSIS — M4310 Spondylolisthesis, site unspecified: Secondary | ICD-10-CM | POA: Diagnosis present

## 2021-03-31 DIAGNOSIS — Z87891 Personal history of nicotine dependence: Secondary | ICD-10-CM | POA: Diagnosis not present

## 2021-03-31 DIAGNOSIS — M4807 Spinal stenosis, lumbosacral region: Secondary | ICD-10-CM | POA: Diagnosis present

## 2021-03-31 DIAGNOSIS — M5417 Radiculopathy, lumbosacral region: Secondary | ICD-10-CM | POA: Diagnosis not present

## 2021-03-31 DIAGNOSIS — Z981 Arthrodesis status: Secondary | ICD-10-CM | POA: Diagnosis not present

## 2021-03-31 DIAGNOSIS — M4727 Other spondylosis with radiculopathy, lumbosacral region: Secondary | ICD-10-CM | POA: Diagnosis not present

## 2021-03-31 DIAGNOSIS — Z20822 Contact with and (suspected) exposure to covid-19: Secondary | ICD-10-CM | POA: Diagnosis present

## 2021-03-31 DIAGNOSIS — K219 Gastro-esophageal reflux disease without esophagitis: Secondary | ICD-10-CM | POA: Diagnosis not present

## 2021-03-31 DIAGNOSIS — F32A Depression, unspecified: Secondary | ICD-10-CM | POA: Diagnosis not present

## 2021-03-31 DIAGNOSIS — M4726 Other spondylosis with radiculopathy, lumbar region: Secondary | ICD-10-CM | POA: Diagnosis not present

## 2021-03-31 DIAGNOSIS — M5416 Radiculopathy, lumbar region: Secondary | ICD-10-CM | POA: Diagnosis not present

## 2021-03-31 DIAGNOSIS — I10 Essential (primary) hypertension: Secondary | ICD-10-CM | POA: Diagnosis present

## 2021-03-31 DIAGNOSIS — M4316 Spondylolisthesis, lumbar region: Secondary | ICD-10-CM | POA: Diagnosis not present

## 2021-03-31 DIAGNOSIS — Z419 Encounter for procedure for purposes other than remedying health state, unspecified: Secondary | ICD-10-CM

## 2021-03-31 HISTORY — PX: TRANSFORAMINAL LUMBAR INTERBODY FUSION W/ MIS 1 LEVEL: SHX6145

## 2021-03-31 SURGERY — MINIMALLY INVASIVE (MIS) TRANSFORAMINAL LUMBAR INTERBODY FUSION (TLIF) 1 LEVEL
Anesthesia: General | Laterality: Right

## 2021-03-31 MED ORDER — PHENYLEPHRINE HCL-NACL 20-0.9 MG/250ML-% IV SOLN
INTRAVENOUS | Status: DC | PRN
  Administered 2021-03-31: 25 ug/min via INTRAVENOUS

## 2021-03-31 MED ORDER — ACETAMINOPHEN 500 MG PO TABS: 1000.0000 mg | ORAL_TABLET | Freq: Once | ORAL | Status: DC

## 2021-03-31 MED ORDER — FENTANYL CITRATE (PF) 100 MCG/2ML IJ SOLN
25.0000 ug | INTRAMUSCULAR | Status: DC | PRN
  Administered 2021-03-31: 50 ug via INTRAVENOUS

## 2021-03-31 MED ORDER — SODIUM CHLORIDE 0.9% FLUSH: 3.0000 mL | Freq: Two times a day (BID) | INTRAVENOUS | Status: DC

## 2021-03-31 MED ORDER — FENTANYL CITRATE (PF) 100 MCG/2ML IJ SOLN
INTRAMUSCULAR | Status: DC | PRN
  Administered 2021-03-31 (×3): 50 ug via INTRAVENOUS

## 2021-03-31 MED ORDER — CEFAZOLIN SODIUM-DEXTROSE 2-4 GM/100ML-% IV SOLN
2.0000 g | Freq: Three times a day (TID) | INTRAVENOUS | Status: AC
  Administered 2021-03-31 – 2021-04-01 (×2): 2 g via INTRAVENOUS
  Filled 2021-03-31 (×2): qty 100

## 2021-03-31 MED ORDER — MENTHOL 3 MG MT LOZG: 1.0000 | LOZENGE | OROMUCOSAL | Status: DC | PRN

## 2021-03-31 MED ORDER — LACTATED RINGERS IV SOLN
INTRAVENOUS | Status: DC
Start: 2021-03-31 — End: 2021-03-31

## 2021-03-31 MED ORDER — THROMBIN 5000 UNITS EX SOLR
CUTANEOUS | Status: AC
  Filled 2021-03-31: qty 5000

## 2021-03-31 MED ORDER — CHLORHEXIDINE GLUCONATE CLOTH 2 % EX PADS: 6.0000 | MEDICATED_PAD | Freq: Once | CUTANEOUS | Status: DC

## 2021-03-31 MED ORDER — EPHEDRINE SULFATE-NACL 50-0.9 MG/10ML-% IV SOSY
PREFILLED_SYRINGE | INTRAVENOUS | Status: DC | PRN
  Administered 2021-03-31: 5 mg via INTRAVENOUS
  Administered 2021-03-31: 10 mg via INTRAVENOUS

## 2021-03-31 MED ORDER — OXYCODONE HCL 5 MG PO TABS: 5.0000 mg | ORAL_TABLET | ORAL | Status: DC | PRN

## 2021-03-31 MED ORDER — ARIPIPRAZOLE 10 MG PO TABS
10.0000 mg | ORAL_TABLET | Freq: Every day | ORAL | Status: DC
Start: 2021-04-01 — End: 2021-04-01
  Filled 2021-03-31: qty 1

## 2021-03-31 MED ORDER — CYCLOBENZAPRINE HCL 10 MG PO TABS
10.0000 mg | ORAL_TABLET | Freq: Three times a day (TID) | ORAL | Status: DC | PRN
  Administered 2021-03-31 – 2021-04-01 (×3): 10 mg via ORAL
  Filled 2021-03-31 (×2): qty 1

## 2021-03-31 MED ORDER — KETAMINE HCL 50 MG/5ML IJ SOSY
PREFILLED_SYRINGE | INTRAMUSCULAR | Status: AC
  Filled 2021-03-31: qty 10

## 2021-03-31 MED ORDER — HYDROMORPHONE HCL 1 MG/ML IJ SOLN: 1.0000 mg | INTRAMUSCULAR | Status: DC | PRN

## 2021-03-31 MED ORDER — DEXAMETHASONE SODIUM PHOSPHATE 4 MG/ML IJ SOLN
INTRAMUSCULAR | Status: DC | PRN
Start: 2021-03-31 — End: 2021-03-31
  Administered 2021-03-31: 10 mg via INTRAVENOUS

## 2021-03-31 MED ORDER — ORAL CARE MOUTH RINSE: 15.0000 mL | Freq: Once | OROMUCOSAL | Status: AC

## 2021-03-31 MED ORDER — FUROSEMIDE 20 MG PO TABS: 20.0000 mg | ORAL_TABLET | Freq: Every day | ORAL | Status: DC

## 2021-03-31 MED ORDER — 0.9 % SODIUM CHLORIDE (POUR BTL) OPTIME
TOPICAL | Status: DC | PRN
  Administered 2021-03-31: 1000 mL

## 2021-03-31 MED ORDER — FENTANYL CITRATE (PF) 100 MCG/2ML IJ SOLN
INTRAMUSCULAR | Status: AC
  Filled 2021-03-31: qty 2

## 2021-03-31 MED ORDER — LIDOCAINE-EPINEPHRINE 1 %-1:100000 IJ SOLN
INTRAMUSCULAR | Status: DC | PRN
  Administered 2021-03-31: 10 mL

## 2021-03-31 MED ORDER — ALBUMIN HUMAN 5 % IV SOLN: INTRAVENOUS | Status: DC | PRN

## 2021-03-31 MED ORDER — ONDANSETRON HCL 4 MG/2ML IJ SOLN: 4.0000 mg | Freq: Four times a day (QID) | INTRAMUSCULAR | Status: DC | PRN

## 2021-03-31 MED ORDER — ONDANSETRON HCL 4 MG/2ML IJ SOLN
INTRAMUSCULAR | Status: DC | PRN
  Administered 2021-03-31 (×2): 4 mg via INTRAVENOUS

## 2021-03-31 MED ORDER — ROCURONIUM BROMIDE 10 MG/ML (PF) SYRINGE
PREFILLED_SYRINGE | INTRAVENOUS | Status: DC | PRN
  Administered 2021-03-31: 100 mg via INTRAVENOUS
  Administered 2021-03-31: 30 mg via INTRAVENOUS
  Administered 2021-03-31 (×2): 10 mg via INTRAVENOUS

## 2021-03-31 MED ORDER — ACETAMINOPHEN 10 MG/ML IV SOLN
INTRAVENOUS | Status: AC
  Filled 2021-03-31: qty 100

## 2021-03-31 MED ORDER — PHENYLEPHRINE HCL (PRESSORS) 10 MG/ML IV SOLN
INTRAVENOUS | Status: AC
  Filled 2021-03-31: qty 2

## 2021-03-31 MED ORDER — AMLODIPINE BESYLATE 10 MG PO TABS
10.0000 mg | ORAL_TABLET | Freq: Every day | ORAL | Status: DC
  Filled 2021-03-31: qty 1

## 2021-03-31 MED ORDER — KETAMINE HCL 10 MG/ML IJ SOLN
INTRAMUSCULAR | Status: DC | PRN
  Administered 2021-03-31: 10 mg via INTRAVENOUS
  Administered 2021-03-31: 50 mg via INTRAVENOUS
  Administered 2021-03-31: 10 mg via INTRAVENOUS

## 2021-03-31 MED ORDER — POLYETHYLENE GLYCOL 3350 17 G PO PACK: 17.0000 g | PACK | Freq: Every day | ORAL | Status: DC | PRN

## 2021-03-31 MED ORDER — LIDOCAINE 2% (20 MG/ML) 5 ML SYRINGE
INTRAMUSCULAR | Status: DC | PRN
  Administered 2021-03-31: 60 mg via INTRAVENOUS

## 2021-03-31 MED ORDER — OXYCODONE HCL 5 MG PO TABS
ORAL_TABLET | ORAL | Status: AC
  Filled 2021-03-31: qty 2

## 2021-03-31 MED ORDER — ACETAMINOPHEN 10 MG/ML IV SOLN
INTRAVENOUS | Status: DC | PRN
  Administered 2021-03-31: 1000 mg via INTRAVENOUS

## 2021-03-31 MED ORDER — PROPOFOL 10 MG/ML IV BOLUS
INTRAVENOUS | Status: AC
  Filled 2021-03-31: qty 20

## 2021-03-31 MED ORDER — CHLORHEXIDINE GLUCONATE 0.12 % MT SOLN
15.0000 mL | Freq: Once | OROMUCOSAL | Status: AC
  Administered 2021-03-31: 15 mL via OROMUCOSAL
  Filled 2021-03-31: qty 15

## 2021-03-31 MED ORDER — OXYCODONE HCL 5 MG PO TABS
10.0000 mg | ORAL_TABLET | ORAL | Status: DC | PRN
  Administered 2021-03-31 – 2021-04-01 (×5): 10 mg via ORAL
  Filled 2021-03-31 (×4): qty 2

## 2021-03-31 MED ORDER — SODIUM CHLORIDE 0.9% FLUSH: 3.0000 mL | INTRAVENOUS | Status: DC | PRN

## 2021-03-31 MED ORDER — PROPOFOL 10 MG/ML IV BOLUS
INTRAVENOUS | Status: DC | PRN
  Administered 2021-03-31: 50 mg via INTRAVENOUS
  Administered 2021-03-31: 100 mg via INTRAVENOUS
  Administered 2021-03-31: 50 mg via INTRAVENOUS

## 2021-03-31 MED ORDER — PHENYLEPHRINE 40 MCG/ML (10ML) SYRINGE FOR IV PUSH (FOR BLOOD PRESSURE SUPPORT)
PREFILLED_SYRINGE | INTRAVENOUS | Status: DC | PRN
  Administered 2021-03-31 (×3): 80 ug via INTRAVENOUS
  Administered 2021-03-31: 120 ug via INTRAVENOUS
  Administered 2021-03-31: 80 ug via INTRAVENOUS

## 2021-03-31 MED ORDER — LIDOCAINE-EPINEPHRINE 1 %-1:100000 IJ SOLN
INTRAMUSCULAR | Status: AC
  Filled 2021-03-31: qty 1

## 2021-03-31 MED ORDER — SUGAMMADEX SODIUM 200 MG/2ML IV SOLN
INTRAVENOUS | Status: DC | PRN
  Administered 2021-03-31 (×2): 200 mg via INTRAVENOUS

## 2021-03-31 MED ORDER — DOCUSATE SODIUM 100 MG PO CAPS
100.0000 mg | ORAL_CAPSULE | Freq: Two times a day (BID) | ORAL | Status: DC
  Administered 2021-03-31: 100 mg via ORAL
  Filled 2021-03-31: qty 1

## 2021-03-31 MED ORDER — THROMBIN 5000 UNITS EX SOLR
OROMUCOSAL | Status: DC | PRN
  Administered 2021-03-31: 5 mL via TOPICAL

## 2021-03-31 MED ORDER — PANTOPRAZOLE SODIUM 40 MG PO TBEC: 40.0000 mg | DELAYED_RELEASE_TABLET | Freq: Every day | ORAL | Status: DC

## 2021-03-31 MED ORDER — SODIUM CHLORIDE 0.9 % IV SOLN: 250.0000 mL | INTRAVENOUS | Status: DC

## 2021-03-31 MED ORDER — ONDANSETRON HCL 4 MG PO TABS: 4.0000 mg | ORAL_TABLET | Freq: Four times a day (QID) | ORAL | Status: DC | PRN

## 2021-03-31 MED ORDER — MIDAZOLAM HCL 2 MG/2ML IJ SOLN
INTRAMUSCULAR | Status: AC
  Filled 2021-03-31: qty 2

## 2021-03-31 MED ORDER — ACETAMINOPHEN 325 MG PO TABS
650.0000 mg | ORAL_TABLET | ORAL | Status: DC | PRN
  Administered 2021-04-01: 650 mg via ORAL
  Filled 2021-03-31: qty 2

## 2021-03-31 MED ORDER — POTASSIUM CHLORIDE CRYS ER 20 MEQ PO TBCR: 20.0000 meq | EXTENDED_RELEASE_TABLET | Freq: Every day | ORAL | Status: DC

## 2021-03-31 MED ORDER — CEFAZOLIN SODIUM-DEXTROSE 2-4 GM/100ML-% IV SOLN
2.0000 g | INTRAVENOUS | Status: AC
  Administered 2021-03-31: 2 g via INTRAVENOUS
  Filled 2021-03-31: qty 100

## 2021-03-31 MED ORDER — SERTRALINE HCL 100 MG PO TABS
100.0000 mg | ORAL_TABLET | Freq: Every day | ORAL | Status: DC
  Filled 2021-03-31: qty 1

## 2021-03-31 MED ORDER — LORATADINE 10 MG PO TABS: 10.0000 mg | ORAL_TABLET | Freq: Every day | ORAL | Status: DC

## 2021-03-31 MED ORDER — BENAZEPRIL HCL 40 MG PO TABS
40.0000 mg | ORAL_TABLET | Freq: Every day | ORAL | Status: DC
  Filled 2021-03-31: qty 1

## 2021-03-31 MED ORDER — MIDAZOLAM HCL 5 MG/5ML IJ SOLN
INTRAMUSCULAR | Status: DC | PRN
  Administered 2021-03-31: 2 mg via INTRAVENOUS

## 2021-03-31 MED ORDER — GLYCOPYRROLATE 0.2 MG/ML IJ SOLN
INTRAMUSCULAR | Status: DC | PRN
  Administered 2021-03-31: .2 mg via INTRAVENOUS

## 2021-03-31 MED ORDER — METOPROLOL SUCCINATE ER 50 MG PO TB24: 50.0000 mg | ORAL_TABLET | Freq: Every day | ORAL | Status: DC

## 2021-03-31 MED ORDER — FENTANYL CITRATE (PF) 250 MCG/5ML IJ SOLN
INTRAMUSCULAR | Status: AC
  Filled 2021-03-31: qty 5

## 2021-03-31 MED ORDER — PHENOL 1.4 % MT LIQD: 1.0000 | OROMUCOSAL | Status: DC | PRN

## 2021-03-31 MED ORDER — CYCLOBENZAPRINE HCL 10 MG PO TABS
ORAL_TABLET | ORAL | Status: AC
  Filled 2021-03-31: qty 1

## 2021-03-31 MED ORDER — ACETAMINOPHEN 650 MG RE SUPP: 650.0000 mg | RECTAL | Status: DC | PRN

## 2021-03-31 MED ORDER — HYDROCHLOROTHIAZIDE 25 MG PO TABS: 25.0000 mg | ORAL_TABLET | Freq: Every day | ORAL | Status: DC

## 2021-03-31 SURGICAL SUPPLY — 60 items
BAG COUNTER SPONGE SURGICOUNT (BAG) ×2 IMPLANT
BAND RUBBER #18 3X1/16 STRL (MISCELLANEOUS) ×4 IMPLANT
BASKET BONE COLLECTION (BASKET) ×2 IMPLANT
BLADE CLIPPER SURG (BLADE) IMPLANT
BLADE SURG 11 STRL SS (BLADE) ×2 IMPLANT
BUR MATCHSTICK NEURO 3.0 LAGG (BURR) IMPLANT
BUR PRECISION FLUTE 5.0 (BURR) ×2 IMPLANT
BUR PRECISION MATCH 3.0 13 (BURR) ×2 IMPLANT
CAGE INTERBODY PL SHT 7X22.5 (Plate) ×2 IMPLANT
CNTNR URN SCR LID CUP LEK RST (MISCELLANEOUS) ×1 IMPLANT
CONT SPEC 4OZ STRL OR WHT (MISCELLANEOUS) ×1
COVER BACK TABLE 60X90IN (DRAPES) ×2 IMPLANT
DECANTER SPIKE VIAL GLASS SM (MISCELLANEOUS) ×2 IMPLANT
DERMABOND ADVANCED (GAUZE/BANDAGES/DRESSINGS) ×1
DERMABOND ADVANCED .7 DNX12 (GAUZE/BANDAGES/DRESSINGS) ×1 IMPLANT
DRAPE C-ARM 42X72 X-RAY (DRAPES) ×2 IMPLANT
DRAPE C-ARMOR (DRAPES) ×2 IMPLANT
DRAPE LAPAROTOMY 100X72X124 (DRAPES) ×2 IMPLANT
DRAPE MICROSCOPE LEICA (MISCELLANEOUS) ×2 IMPLANT
DRAPE SURG 17X23 STRL (DRAPES) ×4 IMPLANT
ELECT BLADE 6.5 EXT (BLADE) ×2 IMPLANT
ELECT REM PT RETURN 9FT ADLT (ELECTROSURGICAL) ×2
ELECTRODE REM PT RTRN 9FT ADLT (ELECTROSURGICAL) ×1 IMPLANT
EXTENDER TAB GUIDE SV 5.5/6.0 (INSTRUMENTS) ×16 IMPLANT
GAUZE 4X4 16PLY ~~LOC~~+RFID DBL (SPONGE) ×2 IMPLANT
GAUZE SPONGE 4X4 12PLY STRL (GAUZE/BANDAGES/DRESSINGS) ×2 IMPLANT
GLOVE SURG LTX SZ7 (GLOVE) ×2 IMPLANT
GLOVE SURG LTX SZ7.5 (GLOVE) ×2 IMPLANT
GLOVE SURG UNDER POLY LF SZ7.5 (GLOVE) ×8 IMPLANT
GOWN STRL REUS W/ TWL LRG LVL3 (GOWN DISPOSABLE) ×2 IMPLANT
GOWN STRL REUS W/ TWL XL LVL3 (GOWN DISPOSABLE) ×1 IMPLANT
GOWN STRL REUS W/TWL 2XL LVL3 (GOWN DISPOSABLE) ×2 IMPLANT
GOWN STRL REUS W/TWL LRG LVL3 (GOWN DISPOSABLE) ×2
GOWN STRL REUS W/TWL XL LVL3 (GOWN DISPOSABLE) ×1
GUIDEWIRE BLUNT NT 450 (WIRE) ×8 IMPLANT
HEMOSTAT POWDER KIT SURGIFOAM (HEMOSTASIS) ×2 IMPLANT
KIT BASIN OR (CUSTOM PROCEDURE TRAY) ×2 IMPLANT
KIT POSITION SURG JACKSON T1 (MISCELLANEOUS) ×2 IMPLANT
KIT TURNOVER KIT B (KITS) ×2 IMPLANT
NEEDLE BEVEL TWO-PAK W/1PK (NEEDLE) ×2 IMPLANT
NEEDLE HYPO 18GX1.5 BLUNT FILL (NEEDLE) IMPLANT
NEEDLE HYPO 22GX1.5 SAFETY (NEEDLE) ×2 IMPLANT
NEEDLE SPNL 18GX3.5 QUINCKE PK (NEEDLE) ×2 IMPLANT
NS IRRIG 1000ML POUR BTL (IV SOLUTION) ×2 IMPLANT
PACK LAMINECTOMY NEURO (CUSTOM PROCEDURE TRAY) ×2 IMPLANT
PAD ARMBOARD 7.5X6 YLW CONV (MISCELLANEOUS) ×4 IMPLANT
ROD PERC CCM 5.5X35 (Rod) ×4 IMPLANT
SCREW MAS VOYAGER 6.5X35 (Screw) ×4 IMPLANT
SCREW SET 5.5/6.0MM SOLERA (Screw) ×8 IMPLANT
SCREW SPINAL IFIX 6.5X45 (Screw) ×4 IMPLANT
SPONGE T-LAP 4X18 ~~LOC~~+RFID (SPONGE) ×2 IMPLANT
SUT MNCRL AB 3-0 PS2 18 (SUTURE) ×2 IMPLANT
SUT VIC AB 0 CT1 18XCR BRD8 (SUTURE) ×1 IMPLANT
SUT VIC AB 0 CT1 8-18 (SUTURE) ×1
SUT VIC AB 2-0 CP2 18 (SUTURE) ×2 IMPLANT
SYR 3ML LL SCALE MARK (SYRINGE) IMPLANT
TOWEL GREEN STERILE (TOWEL DISPOSABLE) ×2 IMPLANT
TOWEL GREEN STERILE FF (TOWEL DISPOSABLE) ×2 IMPLANT
TRAY FOLEY MTR SLVR 16FR STAT (SET/KITS/TRAYS/PACK) ×2 IMPLANT
WATER STERILE IRR 1000ML POUR (IV SOLUTION) ×2 IMPLANT

## 2021-03-31 NOTE — Op Note (Signed)
PATIENT: Jeremy [name]  DAY OF SURGERY: 03/31/21   PRE-OPERATIVE DIAGNOSIS:  Lumbar radiculopathy, isthmic spondylolisthesis   POST-OPERATIVE DIAGNOSIS:  Same   PROCEDURE:  Right L5-S1 minimally invasive transforaminal lumbar interbody fusion with bilateral L5-S1 pedicle screw placement, L5-S1 laminectomy with bilateral decompressive foraminotomies and right L5-S1 decompression   SURGEON:  Surgeon(s) and Role:    Judith Part, MD - Primary    Consuella Lose, MD - Assisting   ANESTHESIA: ETGA   BRIEF HISTORY: This is a 65 year old man who presented with low back and right lower extremity radicular pain. The patient was found to have an isthmic spondylolisthesis with foraminal stenosis. His symptoms became non-responsive to non-surgical treatment measures, I therefore recommended an MIS TLIF. This was discussed with the patient as well as risks, benefits, and alternatives and wished to proceed with surgery.   OPERATIVE DETAIL:  The patient was taken to the operating room and placed on the OR table in the prone position. A formal time out was performed with two patient identifiers and confirmed the operative site. Anesthesia was induced by the anesthesia team. The operative site was marked, hair was clipped with surgical clippers, the area was then prepped and draped in a sterile fashion.   Fluoroscopy was used to localize the surgical level. The pedicles were marked and used to create skin incisions bilaterally. With fluoro guidance, Jamshidi needles were used to guide K-wires into the bilateral L5 and S1 pedicles. The K wires were then secured with hemostats and attention turned to the TLIF.  A [employer] tube was then docked to the right L5-S1 facet through the same incision using fluoroscopy. A right L5-S1 facetectomy was performed and the right L5 nerve root was decompressed along its entire course. There was, as expected a pars defect. The tube was wanded medially and an L5-S1  minimally invasive decompression was performed by continuing the decompression to complete the laminectomy to the left side, removal of ligamentum flavum medially until reaching the contralateral foramen. This decompression was obviously more than was needed for the TLIF alone. With the contralateral foraminotomy completed, the tube was wanded back to the disc space. The disc space was identified, incised, and a discectomy was performed in the standard fashion. The endplates were prepped, bone graft was packed into the disc space, and an expandable cage ([employer]) was packed with autograft and placed into the disc space with fluoroscopic confirmation. The tube was removed and hemostasis was obtained during its removal.   Using the previously placed K wires, a tap and then screw with tower were placed bilaterally at L5 and S1. A rod was sized and introduced on both sides, confirmed with fluoroscopy, then final tightened. Hemostasis was again confirmed for both incisions, they were copiously irrigated, and then closed in layers.    EBL:  94m   DRAINS: none   SPECIMENS: none   TJudith Part MD 03/31/21 11:32 AM

## 2021-03-31 NOTE — Anesthesia Procedure Notes (Signed)
Procedure Name: Intubation Date/Time: 03/31/2021 11:50 AM Performed by: Lieutenant Diego, CRNA Pre-anesthesia Checklist: Patient identified, Emergency Drugs available, Suction available and Patient being monitored Patient Re-evaluated:Patient Re-evaluated prior to induction Oxygen Delivery Method: Circle system utilized Preoxygenation: Pre-oxygenation with 100% oxygen Induction Type: IV induction Ventilation: Mask ventilation without difficulty Laryngoscope Size: Miller, Glidescope and 2 Grade View: Grade II Tube type: Oral Number of attempts: 1 Airway Equipment and Method: Stylet, Oral airway and Video-laryngoscopy Placement Confirmation: ETT inserted through vocal cords under direct vision, positive ETCO2 and breath sounds checked- equal and bilateral Secured at: 23 cm Tube secured with: Tape Dental Injury: Teeth and Oropharynx as per pre-operative assessment

## 2021-03-31 NOTE — H&P (Signed)
Surgical H&P Update  HPI: 65 y.o. man with right L5 radiculopathy and low back pain 2/2 L5-S1 isthmic spondylolisthesis. Symptoms became refractory to non-surgical management, I therefore recommended surgery. No changes in health since he was last seen. Still having pain and wishes to proceed with surgery.  PMHx:  Past Medical History:  Diagnosis Date   Anemia 11/16/2014   Anxiety    Aortic aneurysm ([place])    4.1 cm 2019, ascending aorta   Bladder disease    "an unusual bladder disease; don't know what it's called" (06/30/2015)   Bulging lumbar disc    COVID-19 01/09/2021   Depression    GERD (gastroesophageal reflux disease)    History of hiatal hernia    Hyperglycemia 11/16/2014   Hyperlipemia    Hypertension    Joint pain, knee    Migraine    06/30/2015 "maybe 2-3 times/year; not as bad as I used to have them"   OSA (obstructive sleep apnea)    FamHx:  Family History  Problem Relation Age of Onset   Stroke Mother 67       Died of CVA   Hypertension Mother    Anesthesia problems Neg Hx    Hypotension Neg Hx    Malignant hyperthermia Neg Hx    Pseudochol deficiency Neg Hx    SocHx:  reports that he has quit smoking. His smoking use included cigarettes. He has a 9.00 pack-year smoking history. He has never used smokeless tobacco. He reports that he does not drink alcohol and does not use drugs.  Physical Exam: AOx3, PERRL, FS, TM  Strength 5/5 x4, SILTx4 except right L5 numbness  Assesment/Plan: 65 y.o. man with L5-S1 isthmic spondylolisthesis, here for right L5-S1 MIS TLIF. Risks, benefits, and alternatives discussed and the patient would like to continue with surgery.  -OR today -3C post-op  Judith Part, MD 03/31/21 10:54 AM

## 2021-03-31 NOTE — Transfer of Care (Signed)
Immediate Anesthesia Transfer of Care Note  Patient: Jeremy [name]  Procedure(s) Performed: Right Lumbar five Sacral one Minimally invasive transforaminal lumbar interbody fusion (Right)  Patient Location: PACU  Anesthesia Type:General  Level of Consciousness: drowsy and patient cooperative  Airway & Oxygen Therapy: Patient Spontanous Breathing and Patient connected to face mask oxygen  Post-op Assessment: Report given to RN and Post -op Vital signs reviewed and stable  Post vital signs: Reviewed and stable  Last Vitals:  Vitals Value Taken Time  BP 101/66 03/31/21 1540  Temp 36.3 C 03/31/21 1540  Pulse 71 03/31/21 1545  Resp 22 03/31/21 1545  SpO2 97 % 03/31/21 1545  Vitals shown include unvalidated device data.  Last Pain:  Vitals:   03/31/21 1540  TempSrc:   PainSc: Asleep      Patients Stated Pain Goal: 3 ([phone] [id number])  Complications: No notable events documented.

## 2021-03-31 NOTE — Anesthesia Postprocedure Evaluation (Signed)
Anesthesia Post Note  Patient: Jeremy [name]  Procedure(s) Performed: Right Lumbar five Sacral one Minimally invasive transforaminal lumbar interbody fusion (Right)     Patient location during evaluation: PACU Anesthesia Type: General Level of consciousness: awake and alert Pain management: pain level controlled Vital Signs Assessment: post-procedure vital signs reviewed and stable Respiratory status: spontaneous breathing, nonlabored ventilation, respiratory function stable and patient connected to nasal cannula oxygen Cardiovascular status: blood pressure returned to baseline and stable Postop Assessment: no apparent nausea or vomiting Anesthetic complications: no   No notable events documented.  Last Vitals:  Vitals:   03/31/21 1640 03/31/21 1700  BP: (!) 117/58 128/79  Pulse: 76 75  Resp: 19 11  Temp:  (!) 36.2 C  SpO2: 93% 94%    Last Pain:  Vitals:   03/31/21 1640  TempSrc:   PainSc: 3                  Jora Galluzzo L Jowell Bossi

## 2021-04-01 ENCOUNTER — Encounter (HOSPITAL_COMMUNITY): Payer: Self-pay | Admitting: Neurological Surgery

## 2021-04-01 MED ORDER — CYCLOBENZAPRINE HCL 10 MG PO TABS: 10.0000 mg | ORAL_TABLET | Freq: Three times a day (TID) | ORAL | 0 refills | Status: DC | PRN

## 2021-04-01 MED ORDER — OXYCODONE HCL 5 MG PO TABS: 5.0000 mg | ORAL_TABLET | ORAL | 0 refills | Status: DC | PRN

## 2021-04-01 NOTE — Evaluation (Signed)
Occupational Therapy Evaluation Patient Details Name: Jeremy [name] MRN: [id number] DOB: 03-Aug-1956 Today's Date: 04/01/2021    History of Present Illness 65 y.o. man with right L5 radiculopathy and low back pain 2/2 L5-S1 isthmic spondylolisthesis. S/p TLIF L5-S1 with laminiectomy and decompressive foraminotomies;  has a past medical history of Anemia (11/16/2014), Anxiety, Aortic aneurysm [facility]), Bladder disease, Bulging lumbar disc, COVID-19 (01/09/2021), Depression, GERD (gastroesophageal reflux disease), History of hiatal hernia, Hyperglycemia (11/16/2014), Hyperlipemia, Hypertension, Joint pain, knee, Migraine, and OSA (obstructive sleep apnea).   Clinical Impression   Patient admitted for the diagnosis and procedure above.  PTA he lives at home with his spouse and daughter.  Both are able to assist as needed.  Barrier is post surgical discomfort.  Currently he is at his baseline for ADL and in room mobility.  Patient has been walking the halls without assist since 2am.  He is looking to discharge home.  No acute or post acute needs identified.  All questions answered.      Follow Up Recommendations  No OT follow up    Equipment Recommendations  None recommended by OT    Recommendations for Other Services       Precautions / Restrictions Precautions Precautions: Back Precaution Booklet Issued: Yes (comment) Precaution Comments: reviewed and verbalized understanding Restrictions Weight Bearing Restrictions: No Other Position/Activity Restrictions: No brace      Mobility Bed Mobility Overal bed mobility: Modified Independent               Patient Response: Cooperative  Transfers Overall transfer level: Independent                    Balance Overall balance assessment: No apparent balance deficits (not formally assessed)                                         ADL either performed or assessed with clinical judgement   ADL Overall ADL's  : At baseline                                             Vision Baseline Vision/History: Wears glasses Wears Glasses: At all times Patient Visual Report: No change from baseline       Perception     Praxis      Pertinent Vitals/Pain Pain Assessment: 0-10 Pain Score: 6  Pain Location: back Pain Intervention(s): Monitored during session;RN gave pain meds during session     Hand Dominance Right   Extremity/Trunk Assessment Upper Extremity Assessment Upper Extremity Assessment: Overall WFL for tasks assessed   Lower Extremity Assessment Lower Extremity Assessment: Overall WFL for tasks assessed   Cervical / Trunk Assessment Cervical / Trunk Assessment: Other exceptions Cervical / Trunk Exceptions: spine surgery   Communication Communication Communication: No difficulties   Cognition Arousal/Alertness: Awake/alert Behavior During Therapy: WFL for tasks assessed/performed Overall Cognitive Status: Within Functional Limits for tasks assessed                                                      Home Living Family/patient expects to be discharged to:: Private residence  Living Arrangements: Spouse/significant other Available Help at Discharge: Family;Available 24 hours/day Type of Home: House Home Access: Stairs to enter [employer] of Steps: 3 Entrance Stairs-Rails: None Home Layout: One level     Bathroom Shower/Tub: Teacher, early years/pre: Handicapped height Bathroom Accessibility: Yes How Accessible: Accessible via walker Home Equipment: [place] - 2 wheels;Bedside commode          Prior Functioning/Environment Level of Independence: Independent                 OT Problem List: Pain      OT Treatment/Interventions:      OT Goals(Current goals can be found in the care plan section) Acute Rehab OT Goals Patient Stated Goal: return home OT Goal Formulation: With patient Potential to  Achieve Goals: Good  OT Frequency:     Barriers to D/C:  None noted          Co-evaluation              AM-PAC OT "6 Clicks" Daily Activity     Outcome Measure Help from another person eating meals?: None Help from another person taking care of personal grooming?: None Help from another person toileting, which includes using toliet, bedpan, or urinal?: None Help from another person bathing (including washing, rinsing, drying)?: None Help from another person to put on and taking off regular upper body clothing?: None Help from another person to put on and taking off regular lower body clothing?: None 6 Click Score: 24   End of Session Nurse Communication: Mobility status  Activity Tolerance: Patient tolerated treatment well Patient left: in bed;with call bell/phone within reach  OT Visit Diagnosis: Pain                Time: [id number] OT Time Calculation (min): 20 min Charges:  OT General Charges $OT Visit: 1 Visit OT Evaluation $OT Eval Moderate Complexity: 1 Mod  04/01/2021  Rich, OTR/L  Acute Rehabilitation Services  Office:  [phone]   Jeremy [name] 04/01/2021, 9:56 AM

## 2021-04-01 NOTE — Progress Notes (Signed)
Neurosurgery Service Progress Note  Subjective: No acute events overnight, preop back / leg pain resolved, he's very happy   Objective: Vitals:   03/31/21 1930 03/31/21 2258 04/01/21 0345 04/01/21 0734  BP: 106/66 111/72 107/63 116/64  Pulse: 71 76 80 77  Resp: '20 20 18 19  '$ Temp: 98.1 F (36.7 C) 99 F (37.2 C) 98.9 F (37.2 C) 98.9 F (37.2 C)  TempSrc: Oral Oral Oral Oral  SpO2: (!) 89% (!) 85% 93% 93%  Weight:      Height:        Physical Exam: Strength 5/5 x4, SILTx4 Incisions c/d/i  Assessment & Plan: 65 y.o. man s/p L5-S1 MIS TLIF, recovering well.  -discharge home today  Judith Part  04/01/21 8:56 AM

## 2021-04-01 NOTE — Discharge Summary (Signed)
Discharge Summary  Date of Admission: 03/31/2021  Date of Discharge: 04/01/21  Attending Physician: Emelda Brothers, MD  Hospital Course: Patient was admitted following an uncomplicated [id number] MIS TLIF for an isthmic spondylolisthesis. He was recovered in PACU and transferred to [facility]. His hospital course was uncomplicated and the patient was discharged home on 04/01/21. He will follow up in clinic with me in 2 weeks.  Neurologic exam at discharge:  Strength 5/5 x4, SILTx4  Discharge diagnosis: Lumbar isthmic spondylolisthesis  Judith Part, MD 04/01/21 8:55 AM

## 2021-04-01 NOTE — Progress Notes (Signed)
PT Cancellation Note  Patient Details Name: Jeremy [name] MRN: [id number] DOB: 07/05/1956   Cancelled Treatment:    Reason Eval/Treat Not Completed: PT screened, no needs identified, will sign off  Roney Marion, [place] Pager [phone] Office [place] 04/01/2021, 12:00 PM

## 2021-04-01 NOTE — Progress Notes (Signed)
Patient was transported via wheelchair by volunteer for discharge home; in no acute distress nor complaints of pain nor discomfort; room was checked and accounted for all his belongings; discharge instructions given to patient's wife by RN and she verbalized understanding on the instructions given.

## 2021-04-07 ENCOUNTER — Telehealth: Payer: Self-pay | Admitting: Psychiatry

## 2021-04-07 NOTE — Telephone Encounter (Signed)
Next visit is 06/08/21. Jeremy [name] takes 1-1/2 pills of Sertraline and 1 pill of Abilify but he states that he is talking in his sleep and weird things are happening. Can someone please call him about this. His phone number is [phone].

## 2021-04-10 NOTE — Telephone Encounter (Signed)
Tim called back and hasn't heard back from message sent on 8/16 regarding if he should still take 1-1/2 pills of Sertraline. Could someone please call him regarding the attached message. His phone number is [phone].

## 2021-04-10 NOTE — Telephone Encounter (Signed)
Rtc to pt and apologized.He said he is ok and does not need anything now.He said he will call back if he has any concerns.

## 2021-04-14 ENCOUNTER — Telehealth: Payer: Self-pay | Admitting: Family

## 2021-04-14 NOTE — Telephone Encounter (Signed)
Patient advised he needs to call [facility] first, prescription looks like he still has one 90 day refill on it

## 2021-04-14 NOTE — Telephone Encounter (Signed)
Medication: metoprolol succinate (TOPROL-XL) 50 MG 24 hr tablet   Has the patient contacted their pharmacy? No. (If no, request that the patient contact the pharmacy for the refill.) (If yes, when and what did the pharmacy advise?)  Preferred Pharmacy (with phone number or street name): [facility] [place], [place] [address]  [place], [place] [address]  Phone:  [phone]    Agent: Please be advised that RX refills may take up to 3 business days. We ask that you follow-up with your pharmacy.

## 2021-04-21 ENCOUNTER — Other Ambulatory Visit: Payer: Self-pay

## 2021-04-21 ENCOUNTER — Ambulatory Visit: Payer: Medicare Other | Admitting: Psychiatry

## 2021-04-21 DIAGNOSIS — F40298 Other specified phobia: Secondary | ICD-10-CM | POA: Diagnosis not present

## 2021-04-21 DIAGNOSIS — F411 Generalized anxiety disorder: Secondary | ICD-10-CM

## 2021-04-21 DIAGNOSIS — F3341 Major depressive disorder, recurrent, in partial remission: Secondary | ICD-10-CM | POA: Diagnosis not present

## 2021-04-21 NOTE — Progress Notes (Signed)
Psychotherapy Progress Note [facility] Jeremy Moore, PhD LP  [name]     MRN: [id number] Therapy format: Individual psychotherapy Date: 04/21/2021      Start: 8:12a     Stop: 8:52a     Time Spent: 40 min Location: In-person   Session narrative (presenting needs, interim history, self-report of stressors and symptoms, applications of prior therapy, status changes, and interventions made in session) Wife and Dr. Clovis [name] both encouraged him to come back to therapy after resurgence of negative thoughts, e.g., about wife leaving him.  Jeremy [name] died 2023/01/21 in [place].  He had back surgery Aug 9 (fusion and artificial disc), better off than it was.  Limits standing too long, but recovering.  Medically, Abilify is helpful, turns out he went off it due to costs this summer without asking about errors, discounts, or workarounds.  Back on after seeing Dr. Clovis [name] and working through a discount program.  Sleep doing better, with help of muscle relaxants and mood stabilization.  Point of behavioral irritation at home seems to be asking wife if she's "alright" when he sees her sitting and staring off.  Also asks her if he's "doing better", which sounds like it may be taking her emotional temperature too much.  Clear she is committed to the relationship, and daughter Jeremy [name] has been solid reassurance that he is loved, boy both of them.  Been trying to slow down asking Jeremy [name] questions about other things, like whether she's leaving.    Background 30 years ago, Jeremy [name] lost younger brother Jeremy [name], to whom she was an extra mother, and as she grieved, he felt an emptiness in what has always been something of an emotionally dependent relationship, given his orphan history.  Conceivable that his history of psychiatric crisis and disability also tempts him to feel leave-able.  Interpreted Pointed out that his empty feeling losing Jeremy noticeable emotional availability is what drives his  obsessions when they happen.  She also is heavily involved in wildlife rehabilitation (squirrels), and some times he can feel a little left behind.  Interpreted his obsessions as triggered by inarticulate feeling of lost emotional support, which he has always depended on with Jeremy [name], and framed his challenge as taking that feeling for what it is -- momentary loneliness -- rather than a harbinger of rejection and trust what he already knows.  Interpreted how he also knows in the back of his mind that if you do too many annoying things, some people will reject you, and he can't quite trust her "right"  answer sometimes.  Advised he  ask more specific, sensitive, non-loaded questions, like "You seem tired today. Is that it?" or "Having a hard day missing Jeremy [name]?"  offer help or comfort, e.g., back rub if he gets treated like he is warming up to pester, reassure Jeremy [name] he already knows better than to doubt her, he just cares and is trying to show it better.  Therapeutic modalities: Cognitive Behavioral Therapy, Solution-Oriented/Positive Psychology, and Assertiveness/Communication  Mental Status/Observations:  Appearance:   Casual     Behavior:  Appropriate  Motor:  Normal  Speech/Language:   Clear, limited  Affect:  Appropriate  Mood:  constricted  Thought process:  concrete  Thought content:    worry  Sensory/Perceptual disturbances:    WNL  Orientation:  Fully oriented  Attention:  Good    Concentration:  Good  Memory:  WNL  Insight:    Fair  Judgment:   Good  Impulse Control:  Good   Risk Assessment: Danger to Self: No Self-injurious Behavior: No Danger to Others: No Physical Aggression / Violence: No Duty to Warn: No Access to Firearms a concern: No  Assessment of progress:  situational setback(s), recovering  Diagnosis:   ICD-10-CM   1. Generalized anxiety disorder  F41.1     2. Major depressive disorder, recurrent episode, in partial remission ([place])  F33.41      Plan:   Self-assure that Jeremy [name] is committed Try 3 strategies above for modifying pestering interactions Stay with Abilify as directed Other recommendations/advice as may be noted above Continue to utilize previously learned skills ad lib Maintain medication as prescribed and work faithfully with relevant prescriber(s) if any changes are desired or seem indicated Call the clinic on-call service, present to ER, or call 911 if any life-threatening psychiatric crisis Return 2-4 wks.  OK to call for crisis service if needed, or to report unexpectedly good results Already scheduled visit in this office 06/08/2021.  Jeremy Serve, PhD Jeremy Moore, PhD LP Clinical Psychologist, [facility] [address] [place], [place] [address] [phone]

## 2021-05-07 ENCOUNTER — Other Ambulatory Visit (HOSPITAL_COMMUNITY): Payer: Self-pay | Admitting: Neurological Surgery

## 2021-05-07 DIAGNOSIS — M79605 Pain in left leg: Secondary | ICD-10-CM

## 2021-05-07 DIAGNOSIS — M79604 Pain in right leg: Secondary | ICD-10-CM

## 2021-05-11 ENCOUNTER — Telehealth: Payer: Self-pay | Admitting: Family

## 2021-05-11 ENCOUNTER — Ambulatory Visit (HOSPITAL_COMMUNITY)
Admission: RE | Admit: 2021-05-11 | Discharge: 2021-05-11 | Disposition: A | Discharge status: Home / Self Care | Payer: Medicare Other | Source: Ambulatory Visit | Attending: Neurological Surgery | Admitting: Neurological Surgery

## 2021-05-11 ENCOUNTER — Other Ambulatory Visit: Payer: Self-pay

## 2021-05-11 DIAGNOSIS — M79604 Pain in right leg: Secondary | ICD-10-CM

## 2021-05-11 DIAGNOSIS — M79605 Pain in left leg: Secondary | ICD-10-CM | POA: Insufficient documentation

## 2021-05-11 NOTE — Telephone Encounter (Signed)
Mathew from [facility] on [address] : [phone]  Had to report that: Pt was negative for DVT bilaterally.

## 2021-05-12 ENCOUNTER — Ambulatory Visit (INDEPENDENT_AMBULATORY_CARE_PROVIDER_SITE_OTHER): Payer: Medicare Other | Admitting: Psychiatry

## 2021-05-12 DIAGNOSIS — F4001 Agoraphobia with panic disorder: Secondary | ICD-10-CM

## 2021-05-12 DIAGNOSIS — F3341 Major depressive disorder, recurrent, in partial remission: Secondary | ICD-10-CM | POA: Diagnosis not present

## 2021-05-12 DIAGNOSIS — R632 Polyphagia: Secondary | ICD-10-CM | POA: Diagnosis not present

## 2021-05-12 DIAGNOSIS — Z9889 Other specified postprocedural states: Secondary | ICD-10-CM

## 2021-05-12 DIAGNOSIS — F40298 Other specified phobia: Secondary | ICD-10-CM | POA: Diagnosis not present

## 2021-05-12 NOTE — Progress Notes (Signed)
Psychotherapy Progress Note [facility] [name], PhD LP  Patient ID: EVEREST BROD     MRN: [id number] Therapy format: Individual psychotherapy Date: 05/12/2021      Start: 9:14a     Stop: 9:53a     Time Spent: 39 min Location: In-person   Session narrative (presenting needs, interim history, self-report of stressors and symptoms, applications of prior therapy, status changes, and interventions made in session) [name] seems to be in better place.  Still asks if she's "alright" but has tried to ask more specific questions, like whether she's tired, or having a hard time with family members.  Est 3/10 fear of rejection these days, 6-7 last month.  Trusts that continuing to hold back pestering and asking more specific questions will continue to run down his anxiety.  Is offering comfort, especially as [name] feels sidelined by her sister in the wake of her brother's death.  Discussed relapse prevention -- knows he needs to maintain medication.  Abilify continues to help a lot.  Side benefit that it stopped some bruxism he was experiencing.  Continues to recover from back fusion.  Abilify does stimulate appetite, so he's trying to choose better foods.  Advised prioritizing foods that take more chewing, and foods with higher allocation of calories to protein or fat.  Explored motivation to resume walking, despite some peripheral neuralgia.  Just through a sonogram to r/o DVT.  Discussed multiple benefits to walking, including quality time with wife, burning calories, limbering up back, and mood lifting experience.  Commits to try it today, knows of walking friendly area accessible to his house.  PT would like to continue with behavior therapy for health habits, understands availability is difficult.  Therapeutic modalities: Cognitive Behavioral Therapy, Solution-Oriented/Positive Psychology, and Psycho-education/Bibliotherapy  Mental Status/Observations:  Appearance:   Casual      Behavior:  Appropriate  Motor:  Normal and except altered gait  Speech/Language:   Clear and Coherent  Affect:  Appropriate  Mood:  normal  Thought process:  normal  Thought content:    WNL  Sensory/Perceptual disturbances:    WNL exc. Periph neuralgia  Orientation:  Fully oriented  Attention:  Good    Concentration:  Good  Memory:  WNL  Insight:    Fair  Judgment:   Good  Impulse Control:  Fair   Risk Assessment: Danger to Self: No Self-injurious Behavior: No Danger to Others: No Physical Aggression / Violence: No Duty to Warn: No Access to Firearms a concern: No  Assessment of progress:  progressing well  Diagnosis:   ICD-10-CM   1. Fear of abandonment  F40.298    much improved     2. Increased appetite  R63.2     3. Major depressive disorder, recurrent episode, in partial remission ([place])  F33.41     4. Panic disorder with agoraphobia -- in remission  F40.01     5. recent hx back surgery  Z98.890      Plan:  Maintain Abilify, inform Dr. Clovis Pu of bruxism benefit Ensure no medication interruptions in the future and willingness to ask questions rather than make assumptions about lack of supply or cost, prevent any abrupt withdrawal Engage walking habit, with wife as able Stocked the house with more diet friendly snack foods, prioritizing things that require chewing and things that allocate more calories to protein and fat than carbs Other recommendations/advice as may be noted above Continue to utilize previously learned skills ad lib Maintain medication as prescribed and  work faithfully with relevant prescriber(s) if any changes are desired or seem indicated Call the clinic on-call service, 988/hotline, present to ER, or call 911 if any life-threatening psychiatric crisis Return est 6 wks, for time as available. Already scheduled visit in this office 06/08/2021.  Blanchie Serve, PhD [name], PhD LP Clinical Psychologist, [facility]  Group [facility] [address], [place], [place] [address] [phone]

## 2021-05-18 ENCOUNTER — Other Ambulatory Visit: Payer: Self-pay | Admitting: Family

## 2021-05-20 NOTE — Telephone Encounter (Signed)
Patient would like to know if he needs to go ahead and set up that CT with imaging. It showed that the order was canceled. Please advise.

## 2021-05-20 NOTE — Addendum Note (Signed)
Addended by: Jiles Prows on: 05/20/2021 04:06 PM   Modules accepted: Orders

## 2021-05-20 NOTE — Telephone Encounter (Signed)
Order reentered at patient's request.

## 2021-05-21 ENCOUNTER — Other Ambulatory Visit: Payer: Self-pay

## 2021-05-21 DIAGNOSIS — I10 Essential (primary) hypertension: Secondary | ICD-10-CM

## 2021-05-22 ENCOUNTER — Ambulatory Visit (INDEPENDENT_AMBULATORY_CARE_PROVIDER_SITE_OTHER): Payer: Medicare Other

## 2021-05-22 ENCOUNTER — Other Ambulatory Visit: Payer: Self-pay

## 2021-05-22 ENCOUNTER — Other Ambulatory Visit (INDEPENDENT_AMBULATORY_CARE_PROVIDER_SITE_OTHER): Payer: Medicare Other

## 2021-05-22 ENCOUNTER — Ambulatory Visit: Payer: Medicare Other | Attending: Internal Medicine

## 2021-05-22 DIAGNOSIS — E210 Primary hyperparathyroidism: Secondary | ICD-10-CM

## 2021-05-22 DIAGNOSIS — I10 Essential (primary) hypertension: Secondary | ICD-10-CM

## 2021-05-22 DIAGNOSIS — Z23 Encounter for immunization: Secondary | ICD-10-CM

## 2021-05-22 LAB — BASIC METABOLIC PANEL
BUN: 17 mg/dL (ref 6–23)
CO2: 33 mEq/L — ABNORMAL HIGH (ref 19–32)
Calcium: 9.4 mg/dL (ref 8.4–10.5)
Chloride: 100 mEq/L (ref 96–112)
Creatinine, Ser: 0.78 mg/dL (ref 0.40–1.50)
GFR: 92.95 mL/min (ref >60.00)
Glucose, Bld: 97 mg/dL (ref 70–99)
Potassium: 3.4 mEq/L — ABNORMAL LOW (ref 3.5–5.1)
Sodium: 141 mEq/L (ref 135–145)

## 2021-05-22 NOTE — Progress Notes (Signed)
Pt tolerated well

## 2021-05-22 NOTE — Addendum Note (Signed)
Addended by: Legrand Rams on: 05/22/2021 09:54 AM   Modules accepted: Orders

## 2021-05-22 NOTE — Progress Notes (Signed)
   Covid-19 Vaccination Clinic  Name:  Jeremy [name]    MRN: [id number] DOB: 1955-11-20  05/22/2021  Jeremy [name] was observed post Covid-19 immunization for 15 minutes without incident. He was provided with Vaccine Information Sheet and instruction to access the V-Safe system.   Jeremy [name] was instructed to call 911 with any severe reactions post vaccine: Difficulty breathing  Swelling of face and throat  A fast heartbeat  A bad rash all over body  Dizziness and weakness

## 2021-05-24 ENCOUNTER — Telehealth: Payer: Self-pay | Admitting: Family

## 2021-05-24 DIAGNOSIS — E875 Hyperkalemia: Secondary | ICD-10-CM

## 2021-05-24 MED ORDER — POTASSIUM CHLORIDE CRYS ER 10 MEQ PO TBCR: EXTENDED_RELEASE_TABLET | ORAL | 0 refills | Status: DC

## 2021-05-24 NOTE — Telephone Encounter (Signed)
K+ a bit better but still a little low. I would recommend that he increase his Kdur form 2 tabs in AM to 2 tabs in AM and 1 tab in PM. Repeat bmet in 1 week, dx hypokalemia.

## 2021-05-25 NOTE — Telephone Encounter (Signed)
Called patient with results and advised to increase kdur to 3 a day. Patient was scheduled to come in 10-10 for labs

## 2021-05-26 ENCOUNTER — Other Ambulatory Visit: Payer: Self-pay | Admitting: Family

## 2021-05-29 ENCOUNTER — Encounter (HOSPITAL_BASED_OUTPATIENT_CLINIC_OR_DEPARTMENT_OTHER): Payer: Self-pay

## 2021-05-29 ENCOUNTER — Other Ambulatory Visit: Payer: Self-pay

## 2021-05-29 ENCOUNTER — Ambulatory Visit (HOSPITAL_BASED_OUTPATIENT_CLINIC_OR_DEPARTMENT_OTHER)
Admission: RE | Admit: 2021-05-29 | Discharge: 2021-05-29 | Disposition: A | Discharge status: Home / Self Care | Payer: Medicare Other | Source: Ambulatory Visit | Attending: Family | Admitting: Family

## 2021-05-29 DIAGNOSIS — I712 Thoracic aortic aneurysm, without rupture, unspecified: Secondary | ICD-10-CM | POA: Insufficient documentation

## 2021-05-29 DIAGNOSIS — Z86711 Personal history of pulmonary embolism: Secondary | ICD-10-CM | POA: Diagnosis not present

## 2021-05-29 MED ORDER — IOHEXOL 350 MG/ML SOLN
100.0000 mL | Freq: Once | INTRAVENOUS | Status: AC | PRN
  Administered 2021-05-29: 100 mL via INTRAVENOUS

## 2021-06-01 ENCOUNTER — Other Ambulatory Visit: Payer: Self-pay

## 2021-06-01 ENCOUNTER — Other Ambulatory Visit (INDEPENDENT_AMBULATORY_CARE_PROVIDER_SITE_OTHER): Payer: Medicare Other

## 2021-06-01 ENCOUNTER — Telehealth: Payer: Self-pay | Admitting: Family

## 2021-06-01 ENCOUNTER — Encounter: Payer: Self-pay | Admitting: Family

## 2021-06-01 DIAGNOSIS — E875 Hyperkalemia: Secondary | ICD-10-CM

## 2021-06-01 LAB — BASIC METABOLIC PANEL
BUN: 12 mg/dL (ref 6–23)
CO2: 30 mEq/L (ref 19–32)
Calcium: 9.2 mg/dL (ref 8.4–10.5)
Chloride: 101 mEq/L (ref 96–112)
Creatinine, Ser: 0.84 mg/dL (ref 0.40–1.50)
GFR: 90.87 mL/min (ref >60.00)
Glucose, Bld: 90 mg/dL (ref 70–99)
Potassium: 3.7 mEq/L (ref 3.5–5.1)
Sodium: 139 mEq/L (ref 135–145)

## 2021-06-01 NOTE — Telephone Encounter (Signed)
Patient would like a call back to ask more questions regarding his CT scan of the chest. Please advice.

## 2021-06-01 NOTE — Telephone Encounter (Signed)
Please see my chart response

## 2021-06-02 ENCOUNTER — Other Ambulatory Visit (HOSPITAL_BASED_OUTPATIENT_CLINIC_OR_DEPARTMENT_OTHER): Payer: Self-pay

## 2021-06-02 MED ORDER — MODERNA COVID-19 BIVAL BOOSTER 50 MCG/0.5ML IM SUSP
INTRAMUSCULAR | 0 refills | Status: DC
  Filled 2021-06-02: qty 0.5, 1d supply, fill #0

## 2021-06-02 NOTE — Telephone Encounter (Signed)
Patient saw message and responded.

## 2021-06-08 ENCOUNTER — Ambulatory Visit: Payer: Medicare Other | Admitting: Psychiatry

## 2021-06-08 ENCOUNTER — Other Ambulatory Visit: Payer: Self-pay

## 2021-06-08 ENCOUNTER — Encounter: Payer: Self-pay | Admitting: Psychiatry

## 2021-06-08 DIAGNOSIS — F4001 Agoraphobia with panic disorder: Secondary | ICD-10-CM

## 2021-06-08 DIAGNOSIS — R632 Polyphagia: Secondary | ICD-10-CM

## 2021-06-08 DIAGNOSIS — F411 Generalized anxiety disorder: Secondary | ICD-10-CM

## 2021-06-08 DIAGNOSIS — F3341 Major depressive disorder, recurrent, in partial remission: Secondary | ICD-10-CM | POA: Diagnosis not present

## 2021-06-08 DIAGNOSIS — G4710 Hypersomnia, unspecified: Secondary | ICD-10-CM

## 2021-06-08 DIAGNOSIS — G4730 Sleep apnea, unspecified: Secondary | ICD-10-CM

## 2021-06-08 DIAGNOSIS — F333 Major depressive disorder, recurrent, severe with psychotic symptoms: Secondary | ICD-10-CM

## 2021-06-08 MED ORDER — SERTRALINE HCL 100 MG PO TABS: 150.0000 mg | ORAL_TABLET | Freq: Every day | ORAL | 1 refills | Status: DC

## 2021-06-08 MED ORDER — ARIPIPRAZOLE 5 MG PO TABS: 5.0000 mg | ORAL_TABLET | Freq: Every day | ORAL | 0 refills | Status: DC

## 2021-06-08 NOTE — Progress Notes (Signed)
Jeremy [name] [id number] [birth year] 65 y.o.     Subjective:   Patient ID:  Jeremy [name] is a 65 y.o. (DOB February 25, 1956) male.  Chief Complaint:  Chief Complaint  Patient presents with   Follow-up   Depression   Anxiety   Medication Problem    Depression        Associated symptoms include no decreased concentration and no suicidal ideas.  Past medical history includes anxiety.   Anxiety Patient reports no chest pain, confusion, decreased concentration, nervous/anxious behavior, palpitations, shortness of breath or suicidal ideas.     Jeremy [name] presents  today for follow-up of anxiety and depression.   He had been out on disability due to his back since September at that time.  seen April 2020.  Since retirement he has been under less stress and had asked to reduce and hopefully eliminate some medications.  We reduced Abilify from 7.5 to 5 mg daily.  As of December 04, 2019 the following is noted: Still good. nOTHing worse with dose reduction.   Benefit from meds. Help depression, anxiety and anger.  They are all under control. No mood swings nor prolonged depression.  Anxiety OK.  Wants to try to reduce meds again if possible bc less stress as noted. Plan because of sexual side effects with sertraline he prefers to reduce sertraline to 50 mg daily and continue Abilify 5 mg daily.  02/21/2020 appointment with the following noted: Doing well still after reducing sertraline to 50 mg daily but no improvement in sexual function.  depression and anxiety  Are still under control. No panic.   Plan:  We discussed the options of trying to eliminate gradually the Abilify versus reducing the sertraline which is probably more risky.  However he is having sexual side effects from sertraline and no side effects from Abilify.  He prefers to stop the sertraline and see if he can get by without it. Therefore reduce sertraline to 25 mg daily for 2 weeks then stop it  05/07/20 appt with the  following noted: Off sertraline without more depression or anxiety.  No panic.  No withdrawal effects. Sexual function is not better.  Erection problems. Tried Viagra without help.  Saw urologist and had shots which helped then stopped helping. He wants to try stopping Abilify to see if sexual function will return. Plan: Since retiring his stress level has reduced significantly.  His depression and anxiety are under control.  He wants to try to reduce medications again.   He wants to try DC Abilify to see if sexual function is better despite risk relapse. He's been fine so far off sertraline.    06/02/2020 phone call from patient:Pt called and said that he had a panic attack yesterday. He wants to know if he can go back on his medications MD response: Fairly recently discontinued sertraline 50 mg daily.  That is the best antipanic med.  Have him restart sertraline 25 mg daily for 7 days and then 50 mg daily.  Will take several weeks to help.  Hold off the Abilify bc that was for depression. 06/13/20 TC: Pt LM on VM needing apt today to see CC. Returned call, Pt stated he is still having issues with anxiety and needs to see CC today. His follow up apt is 12/13.   2 panic attacks but close today.  06/16/2020 urgent appointment with the following noted: Seen with wife Panic and wanted to restart meds.  Wife says he's more depressed.  Only sleeps and eats and is like a walking zombie without meds. Lost father and stepmother and friend.  She thinks he's depressed.  Jeremy [name] committed suicide years ago.   Now he thinks I'm going to leave him. Family notices his depression.  Having to help adopted father with cancer. He agrees with wife. Tense and SOB with anxiety. Wife thinks he represses things.  Mo left him in an orphanage and has abandonment issues even with her.  Thinking she will leave. Plan: Relapse worse off meds  Increase sertraline to 100 mg daily.  May need to go higher Increase Abilify to 10 mg  daily for severe depression with paranoid rumination. OK prn Xanax prn panic   08/04/20 appt with following noted:  Seen with wife, Jeremy Siad Better with tension.  Feels less down too.   SE increased appetite.  But had recently lost 20 # and lately snacking more. Wife sees dramatic turnaround and he's more talkative and engaged and friends notice too.  It's working. No Xanax used.  Staying awake during day.   Not quite back to normal but really improved per wife.   Plan: If he is back to baseline at follow-up we will consider reducing the Abilify somewhat in order to reduce the weight gain potential if possible.  10/07/2020 appointment with following noted: Still sleeping too much and eating a lot.  Otherwise is OK.  Doesn't feel depressed.  Interest is a little better.  Not anxious.    Not aware of SE otherwise.  Not worrying too much.  Has retired.  Has reduced his stress significantly. Using CPAP. No panic. Plan: Continue sertraline 100 mg daily Reduce Abilify to 5 mg daily for the history of severe paranoid rumination and depression which are improved, but apparently it's sedating him.  12/11/2020 appointment with the following noted: Reduced Abilify to 5 mg daily without much change.   Mood been alright withuot depression.  Anxiety is not a problem. Sleep 9 hours at night and then naps in the morning after breakfast and then in afternoon.  Not much to do.  Not much he wants to do in retirement except golf and physical limitations hurt that. No other hobbies.     Weight stable.  He stopped the Abilify couple of weeks ago bc insurance told him they would not cover it and it would be $800 dollars. Plan:  Continue sertraline 100 mg daily He stopped Abilify 5 mg 2 weeks ago DT cost Disc risk relapse.  Call if relapse bc at risk for a couple of mos.  03/24/2021 appointment with the following noted:  Seen with daughter, Jeremy [name] Patient stopped Abilify 2 weeks prior to last appointment due to being  told it would be $800.  He wanted to stay off the medication and was doing okay at the time of appointment. Taking sertraline 150 mg daily. Xanax makes him too sleepy. Alright.  A little bit of anxiety.  Worrying about things with wife's problems with her brother's death.   Jeremy [name] notices his anxiety is out of control.  He's constantly hovering her and obsessing she may leave him again as in the past.  Definitely not himself and family notices not interacting with people.  She's noticed changes worsening over the year. Jeremy [name] in law died 01/31/23 making things worse. Sleep is interrupted.  He recognizes he's more fearful than normal. Is sad also. Pending back surgery 03/31/2021 Plan: Because he relapsed again off of Abilify we will do the following: Continue  sertraline 150 mg daily Restart Abilify 10 mg daily bc did best on it.  06/08/21 appt noted: Better with Abilify and SE hungry with weight gain of ? Amount. Rubs teeth front together at rest. No depression and anxiety now.  Patient reports stable mood and denies depressed or irritable moods.  Patient denies any recent difficulty with anxiety.  Patient denies difficulty with sleep initiation or maintenance. Denies appetite disturbance.  Patient reports that energy and motivation have been good.  Patient denies any difficulty with concentration.  Patient denies any suicidal ideation. Back pain not gone but better after surgery He remains on  Continue sertraline 150 mg daily and Abilify 10   Past history of psychiatric disability. Not sure how many episodes of depression he's had in life.  Historically about the same levels of depression and anxiety   Past Psychiatric Medication Trials: Sertraline 100 Abilify 10 helped Xanax 0.5  Review of Systems:  Review of Systems  Respiratory:  Negative for shortness of breath.   Cardiovascular:  Negative for chest pain and palpitations.  Genitourinary:        ED persistent  Musculoskeletal:  Positive for  arthralgias and back pain.  Psychiatric/Behavioral:  Negative for agitation, behavioral problems, confusion, decreased concentration, dysphoric mood, hallucinations, self-injury, sleep disturbance and suicidal ideas. The patient is not nervous/anxious and is not hyperactive.    Medications: I have reviewed the patient's current medications.  Current Outpatient Medications  Medication Sig Dispense Refill   amLODipine-benazepril (LOTREL) 10-40 MG capsule TAKE 1 CAPSULE BY MOUTH DAILY 90 capsule 0   Jeremy [name] complex vitamins tablet Take 1 tablet by mouth daily.     COVID-19 mRNA bivalent vaccine, Moderna, (MODERNA COVID-19 BIVAL BOOSTER) 50 MCG/0.5ML injection Inject into the muscle. 0.5 mL 0   cyclobenzaprine (FLEXERIL) 10 MG tablet Take 1 tablet (10 mg total) by mouth 3 (three) times daily as needed for muscle spasms. 30 tablet 0   ferrous sulfate 325 (65 FE) MG tablet Take 1 tablet (325 mg total) by mouth 2 (two) times daily with a meal. 60 tablet 0   fexofenadine (ALLEGRA) 180 MG tablet Take 1 tablet (180 mg total) by mouth daily. 30 tablet 0   furosemide (LASIX) 20 MG tablet TAKE 1 TABLET(20 MG) BY MOUTH DAILY 90 tablet 1   hydrochlorothiazide (HYDRODIURIL) 25 MG tablet TAKE 1 TABLET(25 MG) BY MOUTH DAILY 90 tablet 0   metoprolol succinate (TOPROL-XL) 50 MG 24 hr tablet TAKE 1 TABLET(50 MG) BY MOUTH DAILY WITH OR IMMEDIATELY FOLLOWING A MEAL 90 tablet 1   Multiple Vitamin (MULTIVITAMIN WITH MINERALS) TABS tablet Take 1 tablet by mouth daily.     omeprazole (PRILOSEC) 40 MG capsule Take 1 capsule (40 mg total) by mouth daily. 90 capsule 0   oxyCODONE (OXY IR/ROXICODONE) 5 MG immediate release tablet Take 1 tablet (5 mg total) by mouth every 4 (four) hours as needed for moderate pain ((score 4 to 6)). 30 tablet 0   potassium chloride (KLOR-CON) 10 MEQ tablet Take 2 tablets by mouth in the AM and 1 tablet by mouth in the PM 180 tablet 0   albuterol (PROVENTIL HFA;VENTOLIN HFA) 108 (90 Base) MCG/ACT  inhaler Inhale 2 puffs into the lungs every 6 (six) hours as needed for wheezing or shortness of breath. (Patient not taking: No sig reported) 1 Inhaler 2   albuterol (PROVENTIL) (2.5 MG/3ML) 0.083% nebulizer solution Take 3 mLs (2.5 mg total) by nebulization every 6 (six) hours as needed for wheezing or shortness of  breath. (Patient not taking: No sig reported) 150 mL 1   ARIPiprazole (ABILIFY) 5 MG tablet Take 1 tablet (5 mg total) by mouth daily. 90 tablet 0   Nebulizers (VIOS AEROSOL DELIVERY SYSTEM) MISC 1 each as directed. (Patient not taking: No sig reported)  0   sertraline (ZOLOFT) 100 MG tablet Take 1.5 tablets (150 mg total) by mouth daily. 135 tablet 1   No current facility-administered medications for this visit.    Medication Side Effects: ED with Zoloft.  Viagra failed.  Using shots.  Allergies: No Known Allergies  Past Medical History:  Diagnosis Date   Anemia 11/16/2014   Anxiety    Aortic aneurysm (HCC)    4.1 cm 2019, ascending aorta   Bladder disease    "an unusual bladder disease; don't know what it's called" (06/30/2015)   Bulging lumbar disc    COVID-19 01/09/2021   Depression    GERD (gastroesophageal reflux disease)    History of hiatal hernia    Hyperglycemia 11/16/2014   Hyperlipemia    Hypertension    Joint pain, knee    Migraine    06/30/2015 "maybe 2-3 times/year; not as bad as I used to have them"   OSA (obstructive sleep apnea)     Family History  Problem Relation Age of Onset   Stroke Mother 8       Died of CVA   Hypertension Mother    Anesthesia problems Neg Hx    Hypotension Neg Hx    Malignant hyperthermia Neg Hx    Pseudochol deficiency Neg Hx     Social History   Socioeconomic History   Marital status: Married    Spouse name: Not on file   Number of children: 3   Years of education: Not on file   Highest education level: Not on file  Occupational History   Not on file  Tobacco Use   Smoking status: Former    Packs/day:  1.50    Years: 6.00    Pack years: 9.00    Types: Cigarettes   Smokeless tobacco: Never   Tobacco comments:    "stopped smoking in ~ 1990"  Substance and Sexual Activity   Alcohol use: No    Alcohol/week: 0.0 standard drinks   Drug use: No   Sexual activity: Yes  Other Topics Concern   Not on file  Social History Narrative   3 children    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   Please see review of systems for further details on the patient's review from today.   Objective:   Physical Exam:  There were no vitals taken for this visit.  Physical Exam Constitutional:      General: He is not in acute distress.    Appearance: He is well-developed.  Musculoskeletal:        General: No deformity.  Neurological:     Mental Status: He is alert and oriented to person, place, and time.     Motor: No tremor.  Coordination: Coordination normal.     Gait: Gait normal.  Psychiatric:        Attention and Perception: Attention and perception normal.        Mood and Affect: Mood is not depressed. Affect is not labile, blunt, flat, angry or inappropriate.        Speech: Speech normal.        Behavior: Behavior is not slowed.        Thought Content: Thought content is not paranoid. Thought content does not include homicidal or suicidal ideation. Thought content does not include homicidal or suicidal plan.        Cognition and Memory: Cognition normal.        Judgment: Judgment normal.     Comments: Insight intact. No auditory or  visual hallucinations.    Lab Review:     Component Value Date/Time   NA 139 06/01/2021 0959   K 3.7 06/01/2021 0959   CL 101 06/01/2021 0959   CO2 30 06/01/2021 0959   GLUCOSE 90 06/01/2021 0959   BUN 12 06/01/2021 0959   CREATININE 0.84 06/01/2021 0959   CREATININE 1.05 01/13/2018 1542   CALCIUM 9.2 06/01/2021 0959   PROT 6.4 09/23/2020 0949   ALBUMIN 4.1 09/23/2020 0949   AST 21 09/23/2020 0949   ALT 29 09/23/2020 0949   ALKPHOS 75 09/23/2020 0949   BILITOT 0.4 09/23/2020 0949   GFRNONAA >60 03/26/2021 1259   GFRAA >60 10/25/2017 0438       Component Value Date/Time   WBC 8.8 03/26/2021 1259   RBC 4.40 03/26/2021 1259   HGB 13.9 03/26/2021 1259   HCT 40.2 03/26/2021 1259   PLT 299 03/26/2021 1259   MCV 91.4 03/26/2021 1259   MCH 31.6 03/26/2021 1259   MCHC 34.6 03/26/2021 1259   RDW 13.4 03/26/2021 1259   LYMPHSABS 1.9 03/17/2020 1111   MONOABS 0.6 03/17/2020 1111   EOSABS 0.1 03/17/2020 1111   BASOSABS 0.1 03/17/2020 1111    No results found for: POCLITH, LITHIUM   No results found for: PHENYTOIN, PHENOBARB, VALPROATE, CBMZ   .res Assessment: Plan:    Major depressive disorder, recurrent episode, in partial remission (HCC)  Panic disorder with agoraphobia -- in remission  Generalized anxiety disorder - Plan: sertraline (ZOLOFT) 100 MG tablet  Hypersomnia with sleep apnea  Increased appetite  Severe recurrent major depression w/psychotic features, mood-congruent (HCC) - Plan: ARIPiprazole (ABILIFY) 5 MG tablet, sertraline (ZOLOFT) 100 MG tablet  Panic disorder with agoraphobia - Plan: sertraline (ZOLOFT) 100 MG tablet Erectile 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 We discussed [name] has a history of severe depression and panic disorder and anxiety that caused him to go out on disability in 2019. He relapsed after retirement off  meds. Relapsed again off Abilify in August 2022 and resolved on 10 mg again. However he had weight gain and possibly some EPS. He has had good response to the combination of Zoloft 150 and Abilify  10 mg.  Relapse worse off meds. He's much too inactive in retirement.  Emphasized need to be more active in retirment otherwise health will decline including mental health.  Wife busy in wildlife rehab.   Disc getting Jeremy [name] ack into therapy also  Continue sertraline 150 mg daily Continue Abilify but we will reduce to 5 mg bc hunger and rubbing teeth together may be SE More paranoid  off Abilify. None current.  Disc risk. Disc Good RX  Discussed potential metabolic side effects associated with atypical antipsychotics, as well as potential risk for movement side effects. Advised pt to contact office if movement side effects occur.   Follow-up 3 mos  Lynder Parents, MD, DFAPA  Please see After Visit Summary for patient specific instructions.  Future Appointments  Date Time Provider [place]  06/26/2021 11:00 AM Blanchie Serve, PhD CP-CP None  07/24/2021  9:00 AM Blanchie Serve, PhD CP-CP None  03/10/2022  9:15 AM Hayden Pedro, MD TRE-TRE None    No orders of the defined types were placed in this encounter.     -------------------------------

## 2021-06-26 ENCOUNTER — Ambulatory Visit: Payer: Medicare Other | Admitting: Psychiatry

## 2021-06-26 ENCOUNTER — Other Ambulatory Visit: Payer: Self-pay

## 2021-06-26 DIAGNOSIS — F3341 Major depressive disorder, recurrent, in partial remission: Secondary | ICD-10-CM

## 2021-06-26 DIAGNOSIS — Z9889 Other specified postprocedural states: Secondary | ICD-10-CM

## 2021-06-26 DIAGNOSIS — F4001 Agoraphobia with panic disorder: Secondary | ICD-10-CM

## 2021-06-26 NOTE — Progress Notes (Signed)
Psychotherapy Progress Note [facility] [name], PhD LP  Patient ID: TAJH LIVSEY [facility] "[name]    MRN: [id number] Therapy format: Individual psychotherapy Date: 06/26/2021      Start: 11:19a     Stop: 11:48a     Time Spent: 29 min Location: In-person   Session narrative (presenting needs, interim history, self-report of stressors and symptoms, applications of prior therapy, status changes, and interventions made in session) Abilify helping with his teeth-grinding, plus chewing gum.  Reduction helped even more.  No further problems filling prescriptions or paying for them.  Has not begun walking habit, but would still benefit.  Has shifted snacking habit more to fruits (bananas, apples, grapes).  Cookies still come in the house ([place] them, or makes brownies).  Discussed mindful eating, smaller portions, and any other measures to lock up temptations.    Holiday season shaping up positively, with plans to see family.  [name] doing better with her brother's death.  Dealt with staying home while [name] went to state fair.  Feeling of fear of abandonment running 1/10 or less.  Doing some things together with [name].  Been going to Bible study regularly, and getting out to eat with son and friends regularly.    Father going through lung cancer treatment, with mets, 14 months now and in Hospice.  Sees him Sundays.  Will be having to sell his truck, knows he doesn't want to part with it.  Getting the "I love yous" he didn't used to get before mother died.    Therapeutic modalities: Cognitive Behavioral Therapy and Solution-Oriented/Positive Psychology  Mental Status/Observations:  Appearance:   Casual     Behavior:  Appropriate  Motor:  Normal  Speech/Language:   Clear and Coherent  Affect:  Appropriate  Mood:  normal  Thought process:  normal  Thought content:    WNL  Sensory/Perceptual disturbances:    WNL  Orientation:  Fully oriented  Attention:  Good     Concentration:  Good  Memory:  WNL  Insight:    Fair  Judgment:   Good  Impulse Control:  Good   Risk Assessment: Danger to Self: No Self-injurious Behavior: No Danger to Others: No Physical Aggression / Violence: No Duty to Warn: No Access to Firearms a concern: No  Assessment of progress:  progressing  Diagnosis:   ICD-10-CM   1. Major depressive disorder, recurrent episode, in partial remission ([place])  F33.41     2. Panic disorder with agoraphobia -- in remission  F40.01     3. recent hx back surgery  Z98.890      Plan:  Ask wife [name] about getting walks together -- half mile to a mile, 4/wk or more Practice smaller portions, longer time with cookies, brownies, candy -- "no woofing" rule Other recommendations/advice as may be noted above Continue to utilize previously learned skills ad lib Maintain medication as prescribed and work faithfully with relevant prescriber(s) if any changes are desired or seem indicated Call the clinic on-call service, 988/hotline, present to ER, or call 911 if any life-threatening psychiatric crisis Return for session(s) already scheduled. Already scheduled visit in this office 07/24/2021.  Blanchie Serve, PhD [name], PhD LP Clinical Psychologist, [facility] [address] [place], [place] [address] [phone]

## 2021-07-08 ENCOUNTER — Telehealth: Payer: Self-pay | Admitting: Family

## 2021-07-08 ENCOUNTER — Other Ambulatory Visit: Payer: Self-pay

## 2021-07-08 MED ORDER — METOPROLOL SUCCINATE ER 50 MG PO TB24: ORAL_TABLET | ORAL | 1 refills | Status: DC

## 2021-07-08 NOTE — Telephone Encounter (Signed)
Pt was requesting refill for rx he was prescribed while at the hospital. Please advise.   Medication: metoprolol succinate   Has the patient contacted their pharmacy? No.  Preferred Pharmacy: pt's call disconnected before he could confirm

## 2021-07-08 NOTE — Telephone Encounter (Signed)
Patient gets this medication from [place], refill sent. Patient advised to scheduled a follow up before the end of the year.

## 2021-07-24 ENCOUNTER — Other Ambulatory Visit: Payer: Self-pay

## 2021-07-24 ENCOUNTER — Ambulatory Visit: Payer: Medicare Other | Admitting: Psychiatry

## 2021-07-24 DIAGNOSIS — F3340 Major depressive disorder, recurrent, in remission, unspecified: Secondary | ICD-10-CM

## 2021-07-24 DIAGNOSIS — F4001 Agoraphobia with panic disorder: Secondary | ICD-10-CM | POA: Diagnosis not present

## 2021-07-24 DIAGNOSIS — F40298 Other specified phobia: Secondary | ICD-10-CM

## 2021-07-24 DIAGNOSIS — M549 Dorsalgia, unspecified: Secondary | ICD-10-CM

## 2021-07-24 NOTE — Progress Notes (Signed)
Psychotherapy Progress Note [facility] Jeremy Moore, PhD LP  [name] [facility] "Jeremy [name]    MRN: [id number] Therapy format: Individual psychotherapy Date: 07/24/2021      Start: 9:15a     Stop: 9:36a     Time Spent: 21 min Location: In-person   Session narrative (presenting needs, interim history, self-report of stressors and symptoms, applications of prior therapy, status changes, and interventions made in session) Good TG with family at [facility].  Also get together with his family.  Coping well with times at home alone.   Hasn't begun walking yet, back still bothering him.  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  No plans for PT, appt with back doctor in 2 weeks.    Father died Jul 26, 2023, content with his passing.  Jeremy [name] of estate business right now, working well with sister Jeremy [name].  Grieving has been fairly light, with good reassurances from faith, content he is in a bette place, relieved of his cancer.    Sleeping fine, no intrusive worries.    Feels he has achieved the recovery he wanted, would like to go PRN for therapy, agreed.  Therapeutic modalities: Cognitive Behavioral Therapy, Solution-Oriented/Positive Psychology, Ego-Supportive, and Psycho-education/Bibliotherapy  Mental Status/Observations:  Appearance:   Casual     Behavior:  Appropriate  Motor:  Normal  Speech/Language:   Clear and Coherent  Affect:  Appropriate  Mood:  normal  Thought process:  normal  Thought content:    WNL  Sensory/Perceptual disturbances:    WNL  Orientation:  Fully oriented  Attention:  Good    Concentration:  Good  Memory:  WNL  Insight:    Fair  Judgment:   Good  Impulse Control:  Good   Risk Assessment: Danger to Self: No Self-injurious Behavior: No Danger to Others: No Physical Aggression / Violence: No Duty to Warn: No Access to Firearms a concern: No  Assessment of  progress:  progressing well  Diagnosis:   ICD-10-CM   1. Major depressive disorder, recurrent, in remission (Jeremy [name])  F33.40     2. Panic disorder with agoraphobia -- in remission  F40.01     3. Fear of abandonment -- controlled  F40.298     4. Back pain, unspecified back location, unspecified back pain laterality, unspecified chronicity  M54.9      Plan:  Continue to keep up with medication supply, knows how to ask after interruptions and cost issues rather than just let it run out. Recommend any degree of walking and stretching for back health and overall wellbeing, presume doctor's advice and would-be PT advice the same Continue seeking and engaging social time beyond just Jeremy [name] for mood, wellbeing, and assurance against potential feelings of abandonment Other recommendations/advice as may be noted above Continue to utilize previously learned skills ad lib Maintain medication as prescribed and work faithfully with relevant prescriber(s) if any changes are desired or seem indicated Call the clinic on-call service, 988/hotline, 911, or present to [facility] or ER if any life-threatening psychiatric crisis Return for time at discretion. Already scheduled visit in this office 09/08/2021.  Jeremy Serve, PhD Jeremy Moore, PhD LP Clinical Psychologist, [facility] [address], [place], [place] [address] [phone]

## 2021-08-10 DIAGNOSIS — H524 Presbyopia: Secondary | ICD-10-CM | POA: Diagnosis not present

## 2021-08-12 ENCOUNTER — Other Ambulatory Visit: Payer: Self-pay

## 2021-08-12 ENCOUNTER — Telehealth: Payer: Self-pay | Admitting: Family

## 2021-08-12 MED ORDER — POTASSIUM CHLORIDE CRYS ER 10 MEQ PO TBCR: EXTENDED_RELEASE_TABLET | ORAL | 0 refills | Status: DC

## 2021-08-12 NOTE — Telephone Encounter (Signed)
Patient has the first available appointment on Jan 3rd. Would like a partial refill to hold him over.   Medication: potassium chloride (KLOR-CON) 10 MEQ tablet   Has the patient contacted their pharmacy? Yes.   (If no, request that the patient contact the pharmacy for the refill.) (If yes, when and what did the pharmacy advise?)  Preferred Pharmacy (with phone number or street name):  [facility] [address] [place], [address]  [place], [place] [address]  Phone:  [phone]  Fax:  [phone]   Agent: Please be advised that RX refills may take up to 3 business days. We ask that you follow-up with your pharmacy.

## 2021-08-12 NOTE — Telephone Encounter (Signed)
Rx sent 

## 2021-08-18 NOTE — Telephone Encounter (Signed)
Pt has not received rx and needs it till he can be seen by [facility] 1/3.

## 2021-08-19 ENCOUNTER — Other Ambulatory Visit: Payer: Self-pay | Admitting: Family

## 2021-08-20 MED ORDER — POTASSIUM CHLORIDE CRYS ER 10 MEQ PO TBCR: EXTENDED_RELEASE_TABLET | ORAL | 0 refills | Status: DC

## 2021-08-20 NOTE — Addendum Note (Signed)
Addended by: Kem Boroughs D on: 08/20/2021 11:24 AM   Modules accepted: Orders

## 2021-08-20 NOTE — Telephone Encounter (Signed)
Rx was set as no print.  Rx sent in and patient notified.

## 2021-08-21 ENCOUNTER — Other Ambulatory Visit: Payer: Self-pay | Admitting: *Deleted

## 2021-08-21 MED ORDER — POTASSIUM CHLORIDE CRYS ER 10 MEQ PO TBCR: EXTENDED_RELEASE_TABLET | ORAL | 0 refills | Status: DC

## 2021-08-25 ENCOUNTER — Ambulatory Visit (INDEPENDENT_AMBULATORY_CARE_PROVIDER_SITE_OTHER): Payer: Medicare Other | Admitting: Family

## 2021-08-25 ENCOUNTER — Telehealth: Payer: Self-pay | Admitting: Family

## 2021-08-25 VITALS — BP 132/65 | HR 76 | Temp 98.5°F | Resp 16 | Wt 255.2 lb

## 2021-08-25 DIAGNOSIS — I10 Essential (primary) hypertension: Secondary | ICD-10-CM

## 2021-08-25 DIAGNOSIS — F32A Depression, unspecified: Secondary | ICD-10-CM

## 2021-08-25 DIAGNOSIS — I5032 Chronic diastolic (congestive) heart failure: Secondary | ICD-10-CM

## 2021-08-25 DIAGNOSIS — K219 Gastro-esophageal reflux disease without esophagitis: Secondary | ICD-10-CM

## 2021-08-25 DIAGNOSIS — D509 Iron deficiency anemia, unspecified: Secondary | ICD-10-CM

## 2021-08-25 DIAGNOSIS — Z1211 Encounter for screening for malignant neoplasm of colon: Secondary | ICD-10-CM

## 2021-08-25 DIAGNOSIS — K429 Umbilical hernia without obstruction or gangrene: Secondary | ICD-10-CM

## 2021-08-25 DIAGNOSIS — F419 Anxiety disorder, unspecified: Secondary | ICD-10-CM

## 2021-08-25 DIAGNOSIS — I719 Aortic aneurysm of unspecified site, without rupture: Secondary | ICD-10-CM

## 2021-08-25 MED ORDER — OMEPRAZOLE 20 MG PO CPDR: 20.0000 mg | DELAYED_RELEASE_CAPSULE | Freq: Every day | ORAL | 3 refills | Status: DC

## 2021-08-25 NOTE — Progress Notes (Signed)
Subjective:     [name], male    DOB: 29-Mar-1956, 66 y.o.   MRN: [id number]  Chief Complaint  Patient presents with   Hypertension    Here for follow up    Hypertension  Patient is in today for follow up.  HTN- Continues HCTZ, lotrel and metoprolol.   BP Readings from Last 3 Encounters:  08/25/21 132/65  04/01/21 102/87  03/26/21 131/80   He continues lasix every day.  Denies LE edema.  He wears compression socks.  Reports that he developed some protrusion from his belly button.  Reports bulge is reducible.    Lab Results  Component Value Date   HGBA1C 5.5 09/23/2020   Wt Readings from Last 3 Encounters:  08/25/21 255 lb 3.2 oz (115.8 kg)  03/31/21 234 lb (106.1 kg)  03/26/21 234 lb 9.6 oz (106.4 kg)   He is seeing Dr. Clovis Pu and is maintained on Abilify as well as Zoloft.    Health Maintenance Due  Topic Date Due   COLONOSCOPY (Pts 45-74yrs Insurance coverage will need to be confirmed)  09/23/2018   Pneumonia Vaccine 72+ Years old (2 - PCV) 03/17/2021    Past Medical History:  Diagnosis Date   Anemia 11/16/2014   Anxiety    Aortic aneurysm (HCC)    4.1 cm 2019, ascending aorta   Bladder disease    "an unusual bladder disease; don't know what it's called" (06/30/2015)   Bulging lumbar disc    COVID-19 01/09/2021   Depression    GERD (gastroesophageal reflux disease)    History of hiatal hernia    Hyperglycemia 11/16/2014   Hyperlipemia    Hypertension    Joint pain, knee    Migraine    06/30/2015 "maybe 2-3 times/year; not as bad as I used to have them"   OSA (obstructive sleep apnea)     Past Surgical History:  Procedure Laterality Date   ESOPHAGOGASTRODUODENOSCOPY     ESOPHAGOGASTRODUODENOSCOPY (EGD) WITH ESOPHAGEAL DILATION  ~ 2014   JOINT REPLACEMENT     right knee   KNEE ARTHROSCOPY Right 08/23/1993   NASAL SEPTUM SURGERY  ~ 1972   TOTAL KNEE ARTHROPLASTY  10/11/2011   Procedure: TOTAL KNEE ARTHROPLASTY;  Surgeon:  Rudean Haskell, MD;  Location: [place];  Service: Orthopedics;  Laterality: Right;   TRANSFORAMINAL LUMBAR INTERBODY FUSION W/ MIS 1 LEVEL Right 03/31/2021   Procedure: Right Lumbar five Sacral one Minimally invasive transforaminal lumbar interbody fusion;  Surgeon: Judith Part, MD;  Location: [facility];  Service: Neurosurgery;  Laterality: Right;   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)  ~2014    Family History  Problem Relation Age of Onset   Stroke Mother 46       Died of CVA   Hypertension Mother    Anesthesia problems Neg Hx    Hypotension Neg Hx    Malignant hyperthermia Neg Hx    Pseudochol deficiency Neg Hx     Social History   Socioeconomic History   Marital status: Married    Spouse name: Not on file   Number of children: 3   Years of education: Not on file   Highest education level: Not on file  Occupational History   Not on file  Tobacco Use   Smoking status: Former    Packs/day: 1.50    Years: 6.00    Pack years: 9.00    Types: Cigarettes   Smokeless tobacco: Never  Tobacco comments:    "stopped smoking in ~ 1990"  Substance and Sexual Activity   Alcohol use: No    Alcohol/week: 0.0 standard drinks   Drug use: No   Sexual activity: Yes  Other Topics Concern   Not on file  Social History Narrative   3 children    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 90 tablet 0   b complex vitamins tablet Take 1 tablet by mouth daily.     COVID-19 mRNA bivalent vaccine, Moderna, (MODERNA COVID-19 BIVAL BOOSTER) 50 MCG/0.5ML injection Inject into the muscle. 0.5 mL 0   ferrous sulfate 325 (65 FE) MG tablet Take 1 tablet (325 mg total) by mouth 2 (two) times daily with a meal. 60 tablet 0   fexofenadine (ALLEGRA) 180 MG tablet Take 1 tablet (180 mg total) by mouth daily. 30 tablet 0   furosemide (LASIX) 20 MG tablet TAKE 1 TABLET(20 MG) BY MOUTH DAILY 90 tablet 0   hydrochlorothiazide (HYDRODIURIL) 25 MG tablet TAKE 1 TABLET(25 MG) BY MOUTH DAILY 90 tablet 0   metoprolol succinate (TOPROL-XL) 50 MG 24 hr tablet TAKE 1 TABLET(50 MG) BY MOUTH DAILY WITH OR IMMEDIATELY FOLLOWING A MEAL 90 tablet 1   Multiple Vitamin (MULTIVITAMIN WITH MINERALS) TABS tablet Take 1 tablet by mouth daily.     potassium chloride (KLOR-CON M) 10 MEQ tablet Take 2 tablets by mouth in the AM and 1 tablet by mouth in the PM 270 tablet 0   sertraline (ZOLOFT) 100 MG tablet Take 1.5 tablets (150 mg total) by mouth daily. 135 tablet 1   omeprazole (PRILOSEC) 40 MG capsule Take 1 capsule (40 mg total) by mouth daily. 90 capsule 0   albuterol (PROVENTIL HFA;VENTOLIN HFA) 108 (90 Base) MCG/ACT inhaler Inhale 2 puffs into the lungs every 6 (six) hours as needed for wheezing or shortness of breath. (Patient not taking: No sig reported) 1 Inhaler 2   albuterol (PROVENTIL) (2.5 MG/3ML) 0.083% nebulizer solution Take 3 mLs (2.5 mg total) by nebulization every 6 (six) hours as needed for wheezing or shortness of breath. (Patient not taking: No sig reported) 150 mL 1   cyclobenzaprine (FLEXERIL) 10 MG tablet Take 1 tablet (10 mg total) by mouth 3 (three) times daily as needed for muscle spasms. 30 tablet 0   Nebulizers (VIOS AEROSOL DELIVERY SYSTEM) MISC 1 each as directed. (Patient not taking: No sig reported)  0   oxyCODONE (OXY IR/ROXICODONE) 5 MG immediate release tablet Take 1 tablet (5 mg  total) by mouth every 4 (four) hours as needed for moderate pain ((score 4 to 6)). 30 tablet 0   No facility-administered medications prior to visit.    No Known Allergies  ROS See HPI    Objective:    Physical Exam Constitutional:      General: He is not in acute distress.  Appearance: He is well-developed.  HENT:     Head: Normocephalic and atraumatic.  Cardiovascular:     Rate and Rhythm: Normal rate and regular rhythm.     Heart sounds: No murmur heard. Pulmonary:     Effort: Pulmonary effort is normal. No respiratory distress.     Breath sounds: Normal breath sounds. No wheezing or rales.  Abdominal:     General: Bowel sounds are normal.     Tenderness: There is no abdominal tenderness.     Hernia: A hernia is present. Hernia is present in the umbilical area.     Comments: Large abdomen due to obesity. Small reducible umbilical hernia.   Musculoskeletal:        General: No swelling.  Skin:    General: Skin is warm and dry.  Neurological:     Mental Status: He is alert and oriented to person, place, and time.  Psychiatric:        Behavior: Behavior normal.        Thought Content: Thought content normal.    BP 132/65 (BP Location: Right Arm, Patient Position: Sitting, Cuff Size: Large)    Pulse 76    Temp 98.5 F (36.9 C) (Oral)    Resp 16    Wt 255 lb 3.2 oz (115.8 kg)    SpO2 96%    BMI 41.19 kg/m  Wt Readings from Last 3 Encounters:  08/25/21 255 lb 3.2 oz (115.8 kg)  03/31/21 234 lb (106.1 kg)  03/26/21 234 lb 9.6 oz (106.4 kg)       Assessment & Plan:   Problem List Items Addressed This Visit       Unprioritized   Umbilical hernia without obstruction and without gangrene    Small, reducible. Discussed weight loss as well as signs/symptoms of incarcerated hernia. He is advised to go to the ED if these symptoms occur.  He declines surgical referral for now and wishes just to watch it.       HTN (hypertension) - Primary   Relevant Orders   Basic  metabolic panel   GERD (gastroesophageal reflux disease)    Stable on otc prilosce 20 mg. Continue same.       Relevant Medications   omeprazole (PRILOSEC) 20 MG capsule   Chronic diastolic heart failure (HCC)    Appears euvolemic. Continue current dose of lasix.       Aortic aneurysm (HCC)    Stable at 4.1 CM per CT 10/22. Plan repeat in 1 year.       Anxiety and depression    Stable, management per psychiatry.       Anemia   Relevant Orders   CBC with Differential/Platelet   Iron   Ferritin   Other Visit Diagnoses     Colon cancer screening       Relevant Orders   Ambulatory referral to Gastroenterology       I have discontinued Jeremy [name] "Tim"'s omeprazole, albuterol, Vios Aerosol Delivery System, albuterol, cyclobenzaprine, and oxyCODONE. I am also having him start on omeprazole. Additionally, I am having him maintain his b complex vitamins, multivitamin with minerals, ferrous sulfate, fexofenadine, Moderna COVID-19 Bival Booster, ARIPiprazole, sertraline, metoprolol succinate, furosemide, amLODipine-benazepril, hydrochlorothiazide, and potassium chloride.  Meds ordered this encounter  Medications   omeprazole (PRILOSEC) 20 MG capsule    Sig: Take 1 capsule (20 mg total) by mouth daily.    Dispense:  30 capsule    Refill:  3    Order Specific  Question:   Supervising Provider    Answer:   Penni Homans A [phone]

## 2021-08-25 NOTE — Patient Instructions (Signed)
Please complete lab work prior to leaving.   

## 2021-08-25 NOTE — Assessment & Plan Note (Signed)
Stable on otc prilosce 20 mg. Continue same.

## 2021-08-25 NOTE — Assessment & Plan Note (Signed)
Stable, management per psychiatry.

## 2021-08-25 NOTE — Telephone Encounter (Signed)
See mychart.  

## 2021-08-25 NOTE — Assessment & Plan Note (Signed)
Stable at 4.1 CM per CT 10/22. Plan repeat in 1 year.

## 2021-08-25 NOTE — Assessment & Plan Note (Signed)
Small, reducible. Discussed weight loss as well as signs/symptoms of incarcerated hernia. He is advised to go to the ED if these symptoms occur.  He declines surgical referral for now and wishes just to watch it.

## 2021-08-25 NOTE — Assessment & Plan Note (Signed)
Appears euvolemic. Continue current dose of lasix.

## 2021-08-26 LAB — BASIC METABOLIC PANEL
BUN: 16 mg/dL (ref 6–23)
CO2: 31 mEq/L (ref 19–32)
Calcium: 9.3 mg/dL (ref 8.4–10.5)
Chloride: 104 mEq/L (ref 96–112)
Creatinine, Ser: 0.94 mg/dL (ref 0.40–1.50)
GFR: 84.34 mL/min (ref >60.00)
Glucose, Bld: 130 mg/dL — ABNORMAL HIGH (ref 70–99)
Potassium: 3.8 mEq/L (ref 3.5–5.1)
Sodium: 143 mEq/L (ref 135–145)

## 2021-08-26 LAB — CBC WITH DIFFERENTIAL/PLATELET
Basophils Absolute: 0.1 10*3/uL (ref 0.0–0.1)
Basophils Relative: 1.1 % (ref 0.0–3.0)
Eosinophils Absolute: 0.2 10*3/uL (ref 0.0–0.7)
Eosinophils Relative: 2.8 % (ref 0.0–5.0)
HCT: 41.4 % (ref 39.0–52.0)
Hemoglobin: 13.8 g/dL (ref 13.0–17.0)
Lymphocytes Relative: 31.4 % (ref 12.0–46.0)
Lymphs Abs: 2.3 10*3/uL (ref 0.7–4.0)
MCHC: 33.3 g/dL (ref 30.0–36.0)
MCV: 89.8 fl (ref 78.0–100.0)
Monocytes Absolute: 0.6 10*3/uL (ref 0.1–1.0)
Monocytes Relative: 8.7 % (ref 3.0–12.0)
Neutro Abs: 4.1 10*3/uL (ref 1.4–7.7)
Neutrophils Relative %: 56 % (ref 43.0–77.0)
Platelets: 270 10*3/uL (ref 150.0–400.0)
RBC: 4.61 Mil/uL (ref 4.22–5.81)
RDW: 13.9 % (ref 11.5–15.5)
WBC: 7.4 10*3/uL (ref 4.0–10.5)

## 2021-08-26 LAB — FERRITIN: Ferritin: 147.9 ng/mL (ref 22.0–322.0)

## 2021-08-26 LAB — IRON: Iron: 72 ug/dL (ref 42–165)

## 2021-09-02 ENCOUNTER — Telehealth: Payer: Self-pay | Admitting: Family

## 2021-09-02 NOTE — Telephone Encounter (Signed)
Pt contacted ov regarding concerns about discharge summary on 1/3. Pt would like for someone to return his call to discuss further information. Please advise.

## 2021-09-02 NOTE — Telephone Encounter (Signed)
Had question about pneumonia shot, was informed he had pneumococcal 23 in 2021 and also has not receive a call from [facility], number provided to patient so he can call them

## 2021-09-08 ENCOUNTER — Ambulatory Visit: Payer: Medicare Other | Admitting: Psychiatry

## 2021-09-08 ENCOUNTER — Encounter: Payer: Self-pay | Admitting: Psychiatry

## 2021-09-08 ENCOUNTER — Other Ambulatory Visit: Payer: Self-pay

## 2021-09-08 DIAGNOSIS — R635 Abnormal weight gain: Secondary | ICD-10-CM

## 2021-09-08 DIAGNOSIS — F3340 Major depressive disorder, recurrent, in remission, unspecified: Secondary | ICD-10-CM

## 2021-09-08 DIAGNOSIS — F4001 Agoraphobia with panic disorder: Secondary | ICD-10-CM | POA: Diagnosis not present

## 2021-09-08 DIAGNOSIS — R632 Polyphagia: Secondary | ICD-10-CM | POA: Diagnosis not present

## 2021-09-08 DIAGNOSIS — F411 Generalized anxiety disorder: Secondary | ICD-10-CM

## 2021-09-08 DIAGNOSIS — T50905A Adverse effect of unspecified drugs, medicaments and biological substances, initial encounter: Secondary | ICD-10-CM

## 2021-09-08 DIAGNOSIS — F333 Major depressive disorder, recurrent, severe with psychotic symptoms: Secondary | ICD-10-CM

## 2021-09-08 MED ORDER — SERTRALINE HCL 100 MG PO TABS: 150.0000 mg | ORAL_TABLET | Freq: Every day | ORAL | 1 refills | Status: DC

## 2021-09-08 MED ORDER — METFORMIN HCL 500 MG PO TABS: ORAL_TABLET | ORAL | 2 refills | Status: DC

## 2021-09-08 MED ORDER — ARIPIPRAZOLE 5 MG PO TABS: 5.0000 mg | ORAL_TABLET | Freq: Every day | ORAL | 0 refills | Status: DC

## 2021-09-08 NOTE — Progress Notes (Signed)
Jeremy [name] [id number] [birth year] 66 y.o.     Subjective:   Patient ID:  Jeremy [name] is a 66 y.o. (DOB Apr 10, 1956) male.  Chief Complaint:  Chief Complaint  Patient presents with   Follow-up   Depression    Major depressive disorder, recurrent episode, in partial remission (Jeremy [name])   Medication Problem    Depression        Associated symptoms include no decreased concentration and no suicidal ideas.  Past medical history includes anxiety.   Anxiety Patient reports no chest pain, confusion, decreased concentration, nervous/anxious behavior, palpitations, shortness of breath or suicidal ideas.     Jeremy [name] presents  today for follow-up of anxiety and depression.   He had been out on disability due to his back since September at that time.  seen April 2020.  Since retirement he has been under less stress and had asked to reduce and hopefully eliminate some medications.  We reduced Abilify from 7.5 to 5 mg daily.  As of December 04, 2019 the following is noted: Still good. nOTHing worse with dose reduction.   Benefit from meds. Help depression, anxiety and anger.  They are all under control. No mood swings nor prolonged depression.  Anxiety OK.  Wants to try to reduce meds again if possible bc less stress as noted. Plan because of sexual side effects with sertraline he prefers to reduce sertraline to 50 mg daily and continue Abilify 5 mg daily.  02/21/2020 appointment with the following noted: Doing well still after reducing sertraline to 50 mg daily but no improvement in sexual function.  depression and anxiety  Are still under control. No panic.   Plan:  We discussed the options of trying to eliminate gradually the Abilify versus reducing the sertraline which is probably more risky.  However he is having sexual side effects from sertraline and no side effects from Abilify.  He prefers to stop the sertraline and see if he can get by without it. Therefore reduce sertraline to  25 mg daily for 2 weeks then stop it  05/07/20 appt with the following noted: Off sertraline without more depression or anxiety.  No panic.  No withdrawal effects. Sexual function is not better.  Erection problems. Tried Viagra without help.  Saw urologist and had shots which helped then stopped helping. He wants to try stopping Abilify to see if sexual function will return. Plan: Since retiring his stress level has reduced significantly.  His depression and anxiety are under control.  He wants to try to reduce medications again.   He wants to try DC Abilify to see if sexual function is better despite risk relapse. He's been fine so far off sertraline.    06/02/2020 phone call from patient:Pt called and said that he had a panic attack yesterday. He wants to know if he can go back on his medications MD response: Fairly recently discontinued sertraline 50 mg daily.  That is the best antipanic med.  Have him restart sertraline 25 mg daily for 7 days and then 50 mg daily.  Will take several weeks to help.  Hold off the Abilify bc that was for depression. 06/13/20 TC: Pt LM on VM needing apt today to see CC. Returned call, Pt stated he is still having issues with anxiety and needs to see CC today. His follow up apt is 12/13.   2 panic attacks but close today.  06/16/2020 urgent appointment with the following noted: Seen with wife Panic and wanted  to restart meds.  Wife says he's more depressed.  Only sleeps and eats and is like a walking zombie without meds. Lost father and stepmother and friend.  She thinks he's depressed.  B committed suicide years ago.   Now he thinks I'm going to leave him. Family notices his depression.  Having to help adopted father with cancer. He agrees with wife. Tense and SOB with anxiety. Wife thinks he represses things.  Mo left him in an orphanage and has abandonment issues even with her.  Thinking she will leave. Plan: Relapse worse off meds  Increase sertraline to  100 mg daily.  May need to go higher Increase Abilify to 10 mg daily for severe depression with paranoid rumination. OK prn Xanax prn panic   08/04/20 appt with following noted:  Seen with wife, Jeremy [name] Better with tension.  Feels less down too.   SE increased appetite.  But had recently lost 20 # and lately snacking more. Wife sees dramatic turnaround and he's more talkative and engaged and friends notice too.  It's working. No Xanax used.  Staying awake during day.   Not quite back to normal but really improved per wife.   Plan: If he is back to baseline at follow-up we will consider reducing the Abilify somewhat in order to reduce the weight gain potential if possible.  10/07/2020 appointment with following noted: Still sleeping too much and eating a lot.  Otherwise is OK.  Doesn't feel depressed.  Interest is a little better.  Not anxious.    Not aware of SE otherwise.  Not worrying too much.  Has retired.  Has reduced his stress significantly. Using CPAP. No panic. Plan: Continue sertraline 100 mg daily Reduce Abilify to 5 mg daily for the history of severe paranoid rumination and depression which are improved, but apparently it's sedating him.  12/11/2020 appointment with the following noted: Reduced Abilify to 5 mg daily without much change.   Mood been alright withuot depression.  Anxiety is not a problem. Sleep 9 hours at night and then naps in the morning after breakfast and then in afternoon.  Not much to do.  Not much he wants to do in retirement except golf and physical limitations hurt that. No other hobbies.     Weight stable.  He stopped the Abilify couple of weeks ago bc insurance told him they would not cover it and it would be $800 dollars. Plan:  Continue sertraline 100 mg daily He stopped Abilify 5 mg 2 weeks ago DT cost Disc risk relapse.  Call if relapse bc at risk for a couple of mos.  03/24/2021 appointment with the following noted:  Seen with daughter, Jeremy [name] Patient  stopped Abilify 2 weeks prior to last appointment due to being told it would be $800.  He wanted to stay off the medication and was doing okay at the time of appointment. Taking sertraline 150 mg daily. Xanax makes him too sleepy. Alright.  A little bit of anxiety.  Worrying about things with wife's problems with her brother's death.   Jeremy [name] notices his anxiety is out of control.  He's constantly hovering her and obsessing she may leave him again as in the past.  Definitely not himself and family notices not interacting with people.  She's noticed changes worsening over the year. B in law died 01/10/23 making things worse. Sleep is interrupted.  He recognizes he's more fearful than normal. Is sad also. Pending back surgery 03/31/2021 Plan: Because he relapsed  again off of Abilify we will do the following: Continue sertraline 150 mg daily Restart Abilify 10 mg daily bc did best on it.  06/08/21 appt noted: Better with Abilify and SE hungry with weight gain of ? Amount. Rubs teeth front together at rest. No depression and anxiety now.  Patient reports stable mood and denies depressed or irritable moods.  Patient denies any recent difficulty with anxiety.  Patient denies difficulty with sleep initiation or maintenance. Denies appetite disturbance.  Patient reports that energy and motivation have been good.  Patient denies any difficulty with concentration.  Patient denies any suicidal ideation. Back pain not gone but better after surgery He remains on  Continue sertraline 150 mg daily and Abilify 10 Plan: Continue sertraline 150 mg daily Continue Abilify but we will reduce to 5 mg bc hunger and rubbing teeth together may be SE More paranoid off Abilify.  09/08/2021 appointment noted: Been doing good since here after reduction to Abilify 5 mg daily.  Thinks Abilify SE hunger and weight gain. Rubbing teeth together stopped with reduction in dose. Relapsed  Continues sertraline 150 mg daily.  Past  history of psychiatric disability. Not sure how many episodes of depression he's had in life.  Historically about the same levels of depression and anxiety   Past Psychiatric Medication Trials: Sertraline 100 Abilify 10 helped SE weight gain Xanax 0.5  Review of Systems:  Review of Systems  Constitutional:  Positive for unexpected weight change.  Respiratory:  Negative for shortness of breath.   Cardiovascular:  Negative for chest pain and palpitations.  Genitourinary:        ED persistent  Musculoskeletal:  Positive for arthralgias and back pain.  Psychiatric/Behavioral:  Negative for agitation, behavioral problems, confusion, decreased concentration, dysphoric mood, hallucinations, self-injury, sleep disturbance and suicidal ideas. The patient is not nervous/anxious and is not hyperactive.    Medications: I have reviewed the patient's current medications.  Current Outpatient Medications  Medication Sig Dispense Refill   amLODipine-benazepril (LOTREL) 10-40 MG capsule TAKE 1 CAPSULE BY MOUTH DAILY 90 capsule 0   b complex vitamins tablet Take 1 tablet by mouth daily.     COVID-19 mRNA bivalent vaccine, Moderna, (MODERNA COVID-19 BIVAL BOOSTER) 50 MCG/0.5ML injection Inject into the muscle. 0.5 mL 0   ferrous sulfate 325 (65 FE) MG tablet Take 1 tablet (325 mg total) by mouth 2 (two) times daily with a meal. 60 tablet 0   fexofenadine (ALLEGRA) 180 MG tablet Take 1 tablet (180 mg total) by mouth daily. 30 tablet 0   furosemide (LASIX) 20 MG tablet TAKE 1 TABLET(20 MG) BY MOUTH DAILY 90 tablet 0   hydrochlorothiazide (HYDRODIURIL) 25 MG tablet TAKE 1 TABLET(25 MG) BY MOUTH DAILY 90 tablet 0   metoprolol succinate (TOPROL-XL) 50 MG 24 hr tablet TAKE 1 TABLET(50 MG) BY MOUTH DAILY WITH OR IMMEDIATELY FOLLOWING A MEAL 90 tablet 1   Multiple Vitamin (MULTIVITAMIN WITH MINERALS) TABS tablet Take 1 tablet by mouth daily.     omeprazole (PRILOSEC) 20 MG capsule Take 1 capsule (20 mg total) by  mouth daily. 30 capsule 3   potassium chloride (KLOR-CON M) 10 MEQ tablet Take 2 tablets by mouth in the AM and 1 tablet by mouth in the PM 270 tablet 0   ARIPiprazole (ABILIFY) 5 MG tablet Take 1 tablet (5 mg total) by mouth daily. 90 tablet 0   metFORMIN (GLUCOPHAGE) 500 MG tablet 1 daily for 1 week, then 1 twice daily 60 tablet 2  sertraline (ZOLOFT) 100 MG tablet Take 1.5 tablets (150 mg total) by mouth daily. 135 tablet 1   No current facility-administered medications for this visit.    Medication Side Effects: ED with Zoloft.  Viagra failed.  Using shots.  Allergies: No Known Allergies  Past Medical History:  Diagnosis Date   Anemia 11/16/2014   Anxiety    Aortic aneurysm (HCC)    4.1 cm 2019, ascending aorta   Bladder disease    "an unusual bladder disease; don't know what it's called" (06/30/2015)   Bulging lumbar disc    COVID-19 01/09/2021   Depression    GERD (gastroesophageal reflux disease)    History of hiatal hernia    Hyperglycemia 11/16/2014   Hyperlipemia    Hypertension    Joint pain, knee    Migraine    06/30/2015 "maybe 2-3 times/year; not as bad as I used to have them"   OSA (obstructive sleep apnea)     Family History  Problem Relation Age of Onset   Stroke Mother 21       Died of CVA   Hypertension Mother    Anesthesia problems Neg Hx    Hypotension Neg Hx    Malignant hyperthermia Neg Hx    Pseudochol deficiency Neg Hx     Social History   Socioeconomic History   Marital status: Married    Spouse name: Not on file   Number of children: 3   Years of education: Not on file   Highest education level: Not on file  Occupational History   Not on file  Tobacco Use   Smoking status: Former    Packs/day: 1.50    Years: 6.00    Pack years: 9.00    Types: Cigarettes   Smokeless tobacco: Never   Tobacco comments:    "stopped smoking in ~ 1990"  Substance and Sexual Activity   Alcohol use: No    Alcohol/week: 0.0 standard drinks   Drug  use: No   Sexual activity: Yes  Other Topics Concern   Not on file  Social History Narrative   3 children    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   Please see review of systems for further details on the patient's review from today.   Objective:   Physical Exam:  There were no vitals taken for this visit.  Physical Exam Constitutional:      General: He is not in acute distress.    Appearance: He is well-developed.  Musculoskeletal:        General: No deformity.  Neurological:     Mental Status: He is alert and oriented to person, place, and time.     Motor: No tremor.     Coordination: Coordination normal.     Gait: Gait normal.  Psychiatric:        Attention and Perception: Attention and perception normal.        Mood and Affect: Mood is not anxious or depressed. Affect is not labile, blunt, flat,  angry or inappropriate.        Speech: Speech normal.        Behavior: Behavior is not slowed.        Thought Content: Thought content is not paranoid. Thought content does not include homicidal or suicidal ideation. Thought content does not include suicidal plan.        Cognition and Memory: Cognition normal.        Judgment: Judgment normal.     Comments: Insight intact. No auditory or visual hallucinations.    Lab Review:     Component Value Date/Time   NA 143 08/25/2021 1405   K 3.8 08/25/2021 1405   CL 104 08/25/2021 1405   CO2 31 08/25/2021 1405   GLUCOSE 130 (H) 08/25/2021 1405   BUN 16 08/25/2021 1405    CREATININE 0.94 08/25/2021 1405   CREATININE 1.05 01/13/2018 1542   CALCIUM 9.3 08/25/2021 1405   PROT 6.4 09/23/2020 0949   ALBUMIN 4.1 09/23/2020 0949   AST 21 09/23/2020 0949   ALT 29 09/23/2020 0949   ALKPHOS 75 09/23/2020 0949   BILITOT 0.4 09/23/2020 0949   GFRNONAA >60 03/26/2021 1259   GFRAA >60 10/25/2017 0438       Component Value Date/Time   WBC 7.4 08/25/2021 1405   RBC 4.61 08/25/2021 1405   HGB 13.8 08/25/2021 1405   HCT 41.4 08/25/2021 1405   PLT 270.0 08/25/2021 1405   MCV 89.8 08/25/2021 1405   MCH 31.6 03/26/2021 1259   MCHC 33.3 08/25/2021 1405   RDW 13.9 08/25/2021 1405   LYMPHSABS 2.3 08/25/2021 1405   MONOABS 0.6 08/25/2021 1405   EOSABS 0.2 08/25/2021 1405   BASOSABS 0.1 08/25/2021 1405    No results found for: POCLITH, LITHIUM   No results found for: PHENYTOIN, PHENOBARB, VALPROATE, CBMZ   .res Assessment: Plan:    Major depressive disorder, recurrent, in remission ([place])  Panic disorder with agoraphobia -- in remission  Generalized anxiety disorder - Plan: sertraline (ZOLOFT) 100 MG tablet  Increased appetite  Weight gain due to medication - Plan: metFORMIN (GLUCOPHAGE) 500 MG tablet  Severe recurrent major depression w/psychotic features, mood-congruent (HCC) - Plan: ARIPiprazole (ABILIFY) 5 MG tablet, sertraline (ZOLOFT) 100 MG tablet  Panic disorder with agoraphobia - Plan: sertraline (ZOLOFT) 100 MG tablet Erectile 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 We discussed [name] has a history of severe depression and panic disorder and anxiety that caused him to go out on disability in 2019. He relapsed after retirement off meds. Relapsed again off Abilify in August 2022 and resolved on 10 mg again. However he had weight gain and possibly some EPS. He has had good response to the combination of Zoloft 150 and Abilify  10 mg.   Relapse worse off meds. He's much too inactive in retirement.  Emphasized need to be more active in retirment otherwise health will decline including mental health.  Wife busy in wildlife rehab.   Disc getting b ack into therapy also  Continue sertraline 150 mg daily Continue Abilify 5 mg  bc hunger and rubbing teeth together may be SE More paranoid off Abilify. None current.  Disc risk. Disc Good RX  Switch or no change or metformin DT weight gain disc in detail.  Discussed potential metabolic side effects associated with atypical antipsychotics, as well as potential risk for movement side effects. Advised pt to  contact office if movement side effects occur.  Disc diet and exercise.    Follow-up 3 mos  Lynder Parents, MD, DFAPA  Please see After Visit Summary for patient specific instructions.  Future Appointments  Date Time Provider [place]  03/10/2022  9:15 AM Hayden Pedro, MD [facility] None    No orders of the defined types were placed in this encounter.      -------------------------------

## 2021-10-07 ENCOUNTER — Other Ambulatory Visit: Payer: Self-pay

## 2021-10-07 ENCOUNTER — Telehealth: Payer: Self-pay | Admitting: Family

## 2021-10-07 MED ORDER — POTASSIUM CHLORIDE CRYS ER 10 MEQ PO TBCR: EXTENDED_RELEASE_TABLET | ORAL | 0 refills | Status: DC

## 2021-10-07 NOTE — Telephone Encounter (Signed)
Medication: potassium chloride (KLOR-CON M) 10 MEQ tablet   Has the patient contacted their pharmacy? Yes.    Preferred Pharmacy (with phone number or street name):  [facility] [place], [place] - [address]  [place], [place] [address]  Phone:  [phone]  Fax:  [phone]   Agent: Please be advised that RX refills may take up to 3 business days. We ask that you follow-up with your pharmacy.

## 2021-11-04 DIAGNOSIS — Z6841 Body Mass Index (BMI) 40.0 and over, adult: Secondary | ICD-10-CM | POA: Diagnosis not present

## 2021-11-04 DIAGNOSIS — I10 Essential (primary) hypertension: Secondary | ICD-10-CM | POA: Diagnosis not present

## 2021-11-04 DIAGNOSIS — M4310 Spondylolisthesis, site unspecified: Secondary | ICD-10-CM | POA: Diagnosis not present

## 2021-11-17 ENCOUNTER — Other Ambulatory Visit: Payer: Self-pay | Admitting: Psychiatry

## 2021-11-17 ENCOUNTER — Other Ambulatory Visit: Payer: Self-pay | Admitting: Family

## 2021-11-17 DIAGNOSIS — F4001 Agoraphobia with panic disorder: Secondary | ICD-10-CM

## 2021-11-17 DIAGNOSIS — F411 Generalized anxiety disorder: Secondary | ICD-10-CM

## 2021-11-17 DIAGNOSIS — F333 Major depressive disorder, recurrent, severe with psychotic symptoms: Secondary | ICD-10-CM

## 2021-12-08 ENCOUNTER — Encounter: Payer: Self-pay | Admitting: Psychiatry

## 2021-12-08 ENCOUNTER — Ambulatory Visit: Payer: Medicare Other | Admitting: Psychiatry

## 2021-12-08 DIAGNOSIS — F411 Generalized anxiety disorder: Secondary | ICD-10-CM

## 2021-12-08 DIAGNOSIS — F3340 Major depressive disorder, recurrent, in remission, unspecified: Secondary | ICD-10-CM | POA: Diagnosis not present

## 2021-12-08 DIAGNOSIS — T50905A Adverse effect of unspecified drugs, medicaments and biological substances, initial encounter: Secondary | ICD-10-CM

## 2021-12-08 DIAGNOSIS — F333 Major depressive disorder, recurrent, severe with psychotic symptoms: Secondary | ICD-10-CM

## 2021-12-08 DIAGNOSIS — F4001 Agoraphobia with panic disorder: Secondary | ICD-10-CM | POA: Diagnosis not present

## 2021-12-08 DIAGNOSIS — R635 Abnormal weight gain: Secondary | ICD-10-CM

## 2021-12-08 MED ORDER — ARIPIPRAZOLE 5 MG PO TABS: 5.0000 mg | ORAL_TABLET | Freq: Every day | ORAL | 0 refills | Status: DC

## 2021-12-08 MED ORDER — SERTRALINE HCL 100 MG PO TABS: 150.0000 mg | ORAL_TABLET | Freq: Every day | ORAL | 0 refills | Status: DC

## 2021-12-08 MED ORDER — METFORMIN HCL 500 MG PO TABS: 1000.0000 mg | ORAL_TABLET | Freq: Two times a day (BID) | ORAL | 0 refills | Status: DC

## 2021-12-08 NOTE — Progress Notes (Signed)
[place] ?[id number] ?[birth year] ?66 y.o.  ? ? ? ?Subjective:  ? ?Patient ID:  Jeremy [name] is a 66 y.o. (DOB 1955/12/27) male. ? ?Chief Complaint:  ?Chief Complaint  ?Patient presents with  ? Follow-up  ? Depression  ? Anxiety  ? ? ?Depression ?       Associated symptoms include no decreased concentration and no suicidal ideas.  Past medical history includes anxiety.   ?Anxiety ?Patient reports no chest pain, confusion, decreased concentration, nervous/anxious behavior, palpitations, shortness of breath or suicidal ideas.  ? ?  ?Jeremy [name] Mis presents  today for follow-up of anxiety and depression. ? ? He had been out on disability due to his back since September at that time. ? ?seen April 2020.  Since retirement he has been under less stress and had asked to reduce and hopefully eliminate some medications.  We reduced Abilify from 7.5 to 5 mg daily. ? ?As of December 04, 2019 the following is noted: ?Still good. nOTHing worse with dose reduction.   Benefit from meds. Help depression, anxiety and anger.  They are all under control. No mood swings nor prolonged depression.  Anxiety OK.  Wants to try to reduce meds again if possible bc less stress as noted. ?Plan because of sexual side effects with sertraline he prefers to reduce sertraline to 50 mg daily and continue Abilify 5 mg daily. ? ?02/21/2020 appointment with the following noted: ?Doing well still after reducing sertraline to 50 mg daily but no improvement in sexual function.  depression and anxiety  Are still under control. No panic.   ?Plan:  We discussed the options of trying to eliminate gradually the Abilify versus reducing the sertraline which is probably more risky.  However he is having sexual side effects from sertraline and no side effects from Abilify.  He prefers to stop the sertraline and see if he can get by without it. ?Therefore reduce sertraline to 25 mg daily for 2 weeks then stop it ? ?05/07/20 appt with the following noted: ?Off  sertraline without more depression or anxiety.  No panic.  No withdrawal effects. ?Sexual function is not better.  Erection problems. Tried Viagra without help.  Saw urologist and had shots which helped then stopped helping. ?He wants to try stopping Abilify to see if sexual function will return. ?Plan: Since retiring his stress level has reduced significantly.  His depression and anxiety are under control.  He wants to try to reduce medications again.   ?He wants to try DC Abilify to see if sexual function is better despite risk relapse. ?He's been fine so far off sertraline.   ? ?06/02/2020 phone call from patient:Pt called and said that he had a panic attack yesterday. He wants to know if he can go back on his medications ?MD response: Fairly recently discontinued sertraline 50 mg daily.  That is the best antipanic med.  Have him restart sertraline 25 mg daily for 7 days and then 50 mg daily.  Will take several weeks to help.  Hold off the Abilify bc that was for depression. ?06/13/20 TC: Pt LM on VM needing apt today to see CC. Returned call, Pt stated he is still having issues with anxiety and needs to see CC today. His follow up apt is 12/13.   ?2 panic attacks but close today. ? ?06/16/2020 urgent appointment with the following noted: Seen with wife ?Panic and wanted to restart meds.  Wife says he's more depressed.  Only sleeps and  eats and is like a walking zombie without meds. ?Lost father and stepmother and friend.  She thinks he's depressed.  ?Jeremy [name] committed suicide years ago.   ?Now he thinks I'm going to leave him. ?Family notices his depression.  Having to help adopted father with cancer. ?He agrees with wife. ?Tense and SOB with anxiety. ?Wife thinks he represses things.  Mo left him in an orphanage and has abandonment issues even with her.  Thinking she will leave. ?Plan: Relapse worse off meds  ?Increase sertraline to 100 mg daily.  May need to go higher ?Increase Abilify to 10 mg daily for severe  depression with paranoid rumination. ?OK prn Xanax prn panic  ? ?08/04/20 appt with following noted:  Seen with wife, [name] ?Better with tension.  Feels less down too.   ?SE increased appetite.  But had recently lost 20 # and lately snacking more. ?Wife sees dramatic turnaround and he's more talkative and engaged and friends notice too.  It's working. ?No Xanax used.  Staying awake during day.   ?Not quite back to normal but really improved per wife.   ?Plan: If he is back to baseline at follow-up we will consider reducing the Abilify somewhat in order to reduce the weight gain potential if possible. ? ?10/07/2020 appointment with following noted: ?Still sleeping too much and eating a lot.  Otherwise is OK.  Doesn't feel depressed.  Interest is a little better.  Not anxious.    ?Not aware of SE otherwise.  Not worrying too much.  ?Has retired.  Has reduced his stress significantly. ?Using CPAP. ?No panic. ?Plan: Continue sertraline 100 mg daily ?Reduce Abilify to 5 mg daily for the history of severe paranoid rumination and depression which are improved, but apparently it's sedating him. ? ?12/11/2020 appointment with the following noted: ?Reduced Abilify to 5 mg daily without much change.   ?Mood been alright withuot depression.  Anxiety is not a problem. ?Sleep 9 hours at night and then naps in the morning after breakfast and then in afternoon.  Not much to do.  Not much he wants to do in retirement except golf and physical limitations hurt that. ?No other hobbies.     ?Weight stable.  ?He stopped the Abilify couple of weeks ago bc insurance told him they would not cover it and it would be $800 dollars. ?Plan:  ?Continue sertraline 100 mg daily ?He stopped Abilify 5 mg 2 weeks ago DT cost ?Disc risk relapse.  Call if relapse bc at risk for a couple of mos. ? ?03/24/2021 appointment with the following noted:  Seen with daughter, Delsa Sale ?Patient stopped Abilify 2 weeks prior to last appointment due to being told it would be  $800.  He wanted to stay off the medication and was doing okay at the time of appointment. ?Taking sertraline 150 mg daily. ?Xanax makes him too sleepy. ?Alright.  A little bit of anxiety.  Worrying about things with wife's problems with her brother's death.   ?Delsa Sale notices his anxiety is out of control.  He's constantly hovering her and obsessing she may leave him again as in the past.  Definitely not himself and family notices not interacting with people.  She's noticed changes worsening over the year. ?Jeremy [name] in law died 01-25-23 making things worse. ?Sleep is interrupted.  He recognizes he's more fearful than normal. ?Is sad also. ?Pending back surgery 03/31/2021 ?Plan: Because he relapsed again off of Abilify we will do the following: ?Continue sertraline 150 mg  daily ?Restart Abilify 10 mg daily bc did best on it. ? ?06/08/21 appt noted: ?Better with Abilify and SE hungry with weight gain of ? Amount. ?Rubs teeth front together at rest. ?No depression and anxiety now.  Patient reports stable mood and denies depressed or irritable moods.  Patient denies any recent difficulty with anxiety.  Patient denies difficulty with sleep initiation or maintenance. Denies appetite disturbance.  Patient reports that energy and motivation have been good.  Patient denies any difficulty with concentration.  Patient denies any suicidal ideation. ?Back pain not gone but better after surgery ?He remains on  Continue sertraline 150 mg daily and Abilify 10 ?Plan: Continue sertraline 150 mg daily ?Continue Abilify but we will reduce to 5 mg bc hunger and rubbing teeth together may be SE ?More paranoid off Abilify. ? ?09/08/2021 appointment noted: ?Been doing good since here after reduction to Abilify 5 mg daily.  Thinks Abilify SE hunger and weight gain. Rubbing teeth together stopped with reduction in dose. ?Relapsed  ?Continues sertraline 150 mg daily. ?Plan: Continue sertraline 150 mg daily ?Continue Abilify 5 mg  ?bc hunger and rubbing  teeth together may be SE ?More paranoid off Abilify. ? ?12/08/2021 appointment with the following noted: ?Back surgery August and it still bothers him to walk.  No exercise.  No weight loss. ?Good with depression.

## 2021-12-18 ENCOUNTER — Telehealth: Payer: Self-pay | Admitting: Family

## 2021-12-18 NOTE — Telephone Encounter (Signed)
Medication:  ?furosemide (LASIX) 20 MG tablet ?Has the patient contacted their pharmacy? No. ? ?Preferred Pharmacy (with phone number or street name):  ?[facility] [place], [address]  ?[place], [place] [address]  ?Phone:  [phone]  Fax:  [phone]  ? ?Agent: Please be advised that RX refills may take up to 3 business days. We ask that you follow-up with your pharmacy.  ?

## 2021-12-18 NOTE — Telephone Encounter (Signed)
Called patient to check on rx sent 3/28, it looks like he never picked up. Pharmacist will get rx ready and call patient for pick up. ?

## 2021-12-31 ENCOUNTER — Telehealth: Payer: Self-pay | Admitting: Family

## 2021-12-31 NOTE — Telephone Encounter (Signed)
Left message for patient to call back and schedule Medicare Annual Wellness Visit (AWV).  ? ?Please offer to do virtually or by telephone.  Left office number and my jabber [phone]. ? ?Last AWV:03/17/2020 ? ?Please schedule at anytime with Nurse Health Advisor. ?  ?

## 2022-01-07 ENCOUNTER — Telehealth: Payer: Self-pay

## 2022-01-07 ENCOUNTER — Ambulatory Visit (INDEPENDENT_AMBULATORY_CARE_PROVIDER_SITE_OTHER): Payer: Medicare Other

## 2022-01-07 VITALS — Ht 66.0 in | Wt 255.0 lb

## 2022-01-07 DIAGNOSIS — Z0000 Encounter for general adult medical examination without abnormal findings: Secondary | ICD-10-CM

## 2022-01-07 DIAGNOSIS — Z1211 Encounter for screening for malignant neoplasm of colon: Secondary | ICD-10-CM | POA: Diagnosis not present

## 2022-01-07 NOTE — Telephone Encounter (Signed)
During AWV, pt states he needs refills on the following medications: Amlodipine 40 mg Klor-Con 10 meq HCTZ 25 mg Metoprolol XL 50 mg. Thank you.

## 2022-01-07 NOTE — Progress Notes (Signed)
Subjective:   Jeremy [name] is a 66 y.o. male who presents for Medicare Annual/Subsequent preventive examination. Virtual Visit via Telephone Note  I connected with  Jeremy [name] on 01/07/22 at  2:30 PM EDT by telephone and verified that I am speaking with the correct person using two identifiers.  Location: Patient: HOME Provider: [facility] Persons participating in the virtual visit: patient/Nurse Health Advisor   I discussed the limitations, risks, security and privacy concerns of performing an evaluation and management service by telephone and the availability of in person appointments. The patient expressed understanding and agreed to proceed.  Interactive audio and video telecommunications were attempted between this nurse and patient, however failed, due to patient having technical difficulties OR patient did not have access to video capability.  We continued and completed visit with audio only.  Some vital signs may be absent or patient reported.   Chriss Driver, LPN  Review of Systems     Cardiac Risk Factors include: advanced age (>54mn, >>7women);hypertension;dyslipidemia;male gender;sedentary lifestyle;obesity (BMI >30kg/m2)     Objective:    Today's Vitals   01/07/22 1441  Weight: 255 lb (115.7 kg)  Height: '5\' 6"'$  (1.676 m)   Body mass index is 41.16 kg/m.     01/07/2022    2:46 PM 03/26/2021   11:02 AM 04/12/2018    9:32 AM 11/28/2017    8:59 PM 10/22/2017   12:00 AM 10/21/2017    1:47 PM 06/30/2015    4:00 PM  Advanced Directives  Does Patient Have a Medical Advance Directive? No No No No No No No  Would patient like information on creating a medical advance directive? No - Patient declined  No - Patient declined No - Patient declined No - Patient declined No - Patient declined No - patient declined information    Current Medications (verified) Outpatient Encounter Medications as of 01/07/2022  Medication Sig   amLODipine-benazepril (LOTREL) 10-40  MG capsule TAKE 1 CAPSULE BY MOUTH DAILY   ARIPiprazole (ABILIFY) 5 MG tablet Take 1 tablet (5 mg total) by mouth daily.   ascorbic acid (VITAMIN C) 500 MG tablet Take by mouth.   b complex vitamins tablet Take 1 tablet by mouth daily.   COVID-19 mRNA bivalent vaccine, Moderna, (MODERNA COVID-19 BIVAL BOOSTER) 50 MCG/0.5ML injection Inject into the muscle.   ferrous sulfate 325 (65 FE) MG tablet Take 1 tablet (325 mg total) by mouth 2 (two) times daily with a meal.   fexofenadine (ALLEGRA) 180 MG tablet Take 1 tablet (180 mg total) by mouth daily.   furosemide (LASIX) 20 MG tablet TAKE 1 TABLET(20 MG) BY MOUTH DAILY   hydrochlorothiazide (HYDRODIURIL) 25 MG tablet TAKE 1 TABLET(25 MG) BY MOUTH DAILY   lidocaine (LIDODERM) 5 % 1 patch daily.   metFORMIN (GLUCOPHAGE) 500 MG tablet Take 2 tablets (1,000 mg total) by mouth 2 (two) times daily with a meal. 1 daily for 1 week, then 1 twice daily   methocarbamol (ROBAXIN) 500 MG tablet Take by mouth.   metoprolol succinate (TOPROL-XL) 50 MG 24 hr tablet TAKE 1 TABLET(50 MG) BY MOUTH DAILY WITH OR IMMEDIATELY FOLLOWING A MEAL   Multiple Vitamin (MULTIVITAMIN WITH MINERALS) TABS tablet Take 1 tablet by mouth daily.   omeprazole (PRILOSEC) 20 MG capsule Take 1 capsule (20 mg total) by mouth daily.   oxyCODONE (OXY IR/ROXICODONE) 5 MG immediate release tablet Take by mouth.   potassium chloride (KLOR-CON M) 10 MEQ tablet Take 2 tablets by mouth  in the AM and 1 tablet by mouth in the PM   sertraline (ZOLOFT) 100 MG tablet Take 1.5 tablets (150 mg total) by mouth daily.   No facility-administered encounter medications on file as of 01/07/2022.    Allergies (verified) Patient has no known allergies.   History: Past Medical History:  Diagnosis Date   Anemia 11/16/2014   Anxiety    Aortic aneurysm (HCC)    4.1 cm 2019, ascending aorta   Bladder disease    "an unusual bladder disease; don't know what it's called" (06/30/2015)   Bulging lumbar disc     COVID-19 01/09/2021   Depression    GERD (gastroesophageal reflux disease)    History of hiatal hernia    Hyperglycemia 11/16/2014   Hyperlipemia    Hypertension    Joint pain, knee    Migraine    06/30/2015 "maybe 2-3 times/year; not as bad as I used to have them"   OSA (obstructive sleep apnea)    Past Surgical History:  Procedure Laterality Date   ESOPHAGOGASTRODUODENOSCOPY     ESOPHAGOGASTRODUODENOSCOPY (EGD) WITH ESOPHAGEAL DILATION  ~ 2014   JOINT REPLACEMENT     right knee   KNEE ARTHROSCOPY Right 08/23/1993   NASAL SEPTUM SURGERY  ~ 1972   TOTAL KNEE ARTHROPLASTY  10/11/2011   Procedure: TOTAL KNEE ARTHROPLASTY;  Surgeon: Rudean Haskell, MD;  Location: [facility];  Service: Orthopedics;  Laterality: Right;   TRANSFORAMINAL LUMBAR INTERBODY FUSION W/ MIS 1 LEVEL Right 03/31/2021   Procedure: Right Lumbar five Sacral one Minimally invasive transforaminal lumbar interbody fusion;  Surgeon: Judith Part, MD;  Location: [facility];  Service: Neurosurgery;  Laterality: Right;   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)  ~2014   Family History  Problem Relation Age of Onset   Stroke Mother 41       Died of CVA   Hypertension Mother    Anesthesia problems Neg Hx    Hypotension Neg Hx    Malignant hyperthermia Neg Hx    Pseudochol deficiency Neg Hx    Social History   Socioeconomic History   Marital status: Married    Spouse name: Not on file   Number of children: 3   Years of education: Not on file   Highest education level: Not on file  Occupational History   Not on file  Tobacco Use   Smoking status: Former    Packs/day: 1.50    Years: 6.00    Pack years: 9.00    Types: Cigarettes   Smokeless tobacco: Never   Tobacco comments:    "stopped smoking in ~ 1990"  Substance and Sexual Activity   Alcohol use: No    Alcohol/week: 0.0 standard drinks   Drug use: No   Sexual activity: Yes  Other Topics Concern   Not on file  Social History  Narrative   3 children    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, research (life sciences))   Married   Completed 12th grade   Enjoys softball   Social Determinants of Radio broadcast assistant Strain: Low Risk    Difficulty of Paying Living Expenses: Not hard at all  Food Insecurity: No Food Insecurity   Worried About Charity fundraiser in the Last Year: Never true   Arboriculturist in the Last Year: Never true  Transportation Needs: No Transportation Needs   Lack of Transportation (Medical): No   Lack of Transportation (Non-Medical): No  Physical  Activity: Inactive   Days of Exercise per Week: 0 days   Minutes of Exercise per Session: 0 min  Stress: No Stress Concern Present   Feeling of Stress : Not at all  Social Connections: Socially Integrated   Frequency of Communication with Friends and Family: More than three times a week   Frequency of Social Gatherings with Friends and Family: More than three times a week   Attends Religious Services: More than 4 times per year   Active Member of [employer] or Organizations: Yes   Attends Music therapist: More than 4 times per year   Marital Status: Married    Tobacco Counseling Counseling given: Not Answered Tobacco comments: "stopped smoking in ~ 1990"   Clinical Intake:  Pre-visit preparation completed: Yes  Pain : No/denies pain     BMI - recorded: 41.16 Nutritional Status: BMI > 30  Obese Nutritional Risks: None Diabetes: No  How often do you need to have someone help you when you read instructions, pamphlets, or other written materials from your doctor or pharmacy?: 1 - Never  Diabetic?NO  Interpreter Needed?: No  Information entered by :: mj Oron Westrup, lpn   Activities of Daily Living    01/07/2022    2:47 PM 03/26/2021   11:05 AM  In your present state of health, do you have any difficulty performing the following activities:  Hearing? 0   Vision? 0   Difficulty concentrating or making decisions? 0    Walking or climbing stairs? 0   Dressing or bathing? 0   Doing errands, shopping? 0 0  Preparing Food and eating ? N   Using the Toilet? N   In the past six months, have you accidently leaked urine? Y   Do you have problems with loss of bowel control? N   Managing your Medications? N   Managing your Finances? N   Housekeeping or managing your Housekeeping? N     Patient Care Team: [name], NP as PCP - General (Internal Medicine) Minus Breeding, MD as PCP - Cardiology (Cardiology)  Indicate any recent Medical Services you may have received from other than [facility] providers in the past year (date may be approximate).     Assessment:   This is a routine wellness examination for Jeremy [name].  Hearing/Vision screen Hearing Screening - Comments:: Some hearing issues per pt  Vision Screening - Comments:: Contacts. Dr. Pricilla Riffle. 2022.  Dietary issues and exercise activities discussed: Current Exercise Habits: The patient does not participate in regular exercise at present, Exercise limited by: cardiac condition(s);orthopedic condition(s)   Goals Addressed             This Visit's Progress    Exercise 3x per week (30 min per time)       Encouraged chair exercises.        Depression Screen    01/07/2022    2:44 PM 12/29/2020   10:52 AM 12/22/2020    9:48 AM 09/23/2020    9:16 AM 07/14/2020   10:24 AM 01/13/2018    4:08 PM 08/30/2016    7:22 PM  PHQ 2/9 Scores  PHQ - 2 Score 0 2 0 0 0 0 2  PHQ- 9 Score  2 0 0  1 4    Fall Risk    01/07/2022    2:46 PM 12/29/2020   10:52 AM 12/22/2020    9:48 AM  Fall Risk   Falls in the past year? 0 1 0  Number falls in  past yr: 0 0 0  Injury with Fall? 0 0 0  Risk for fall due to : Impaired balance/gait No Fall Risks No Fall Risks  Follow up Falls prevention discussed      FALL RISK PREVENTION PERTAINING TO THE HOME:  Any stairs in or around the home? Yes  If so, are there any without handrails? No  Home free of loose  throw rugs in walkways, pet beds, electrical cords, etc? Yes  Adequate lighting in your home to reduce risk of falls? Yes   ASSISTIVE DEVICES UTILIZED TO PREVENT FALLS:  Life alert? No  Use of a cane, walker or w/c? No  Grab bars in the bathroom? No  Shower chair or bench in shower? Yes  Elevated toilet seat or a handicapped toilet? No   TIMED UP AND GO:  Was the test performed? No .  Phone visit  Cognitive Function:        01/07/2022    2:48 PM  6CIT Screen  What Year? 0 points  What month? 0 points  What time? 0 points  Count back from 20 0 points  Months in reverse 0 points  Repeat phrase 0 points  Total Score 0 points    Immunizations Immunization History  Administered Date(s) Administered   Fluad Quad(high Dose 65+) 05/22/2021   Influenza,inj,Quad PF,6+ Mos 05/03/2016, 05/16/2017, 05/02/2018, 05/24/2019, 06/18/2020   Influenza-Unspecified 05/23/2014, 05/07/2015, 05/19/2015   Moderna Covid-19 Vaccine Bivalent Booster 33yr & up 05/22/2021   Moderna SARS-COV2 Booster Vaccination 08/08/2020   Moderna Sars-Covid-2 Vaccination 09/24/2019, 10/22/2019   Pneumococcal Polysaccharide-23 03/17/2020   Tdap 11/11/2014   Zoster Recombinat (Shingrix) 07/19/2018, 10/20/2018    TDAP status: Up to date  Flu Vaccine status: Up to date  Pneumococcal vaccine status: Up to date  Covid-19 vaccine status: Completed vaccines  Qualifies for Shingles Vaccine? Yes   Zostavax completed Yes   Shingrix Completed?: Yes  Screening Tests Health Maintenance  Topic Date Due   Pneumonia Vaccine 67 Years old (2 - PCV) 08/20/2022 (Originally 03/17/2021)   COLONOSCOPY (Pts 45-47yrInsurance coverage will need to be confirmed)  08/20/2022 (Originally 09/23/2018)   INFLUENZA VACCINE  03/23/2022   TETANUS/TDAP  11/10/2024   COVID-19 Vaccine  Completed   Hepatitis C Screening  Completed   Zoster Vaccines- Shingrix  Completed   HPV VACCINES  Aged Out    Health Maintenance  There are no  preventive care reminders to display for this patient.   Colorectal cancer screening: Referral to GI placed 01/07/2022. Pt aware the office will call re: appt.  Lung Cancer Screening: (Low Dose CT Chest recommended if Age 67-80ears, 30 pack-year currently smoking OR have quit w/in 15years.) does not qualify.   Additional Screening:  Hepatitis C Screening: does qualify; Completed 01/12/2016  Vision Screening: Recommended annual ophthalmology exams for early detection of glaucoma and other disorders of the eye. Is the patient up to date with their annual eye exam?  Yes  Who is the provider or what is the name of the office in which the patient attends annual eye exams? [facility] with [facility] provider, would they like to be referred to a provider to establish care? No .   Dental Screening: Recommended annual dental exams for proper oral hygiene  Community Resource Referral / Chronic Care Management: CRR required this visit?  No   CCM required this visit?  No      Plan:     I have personally  reviewed and noted the following in the patient's chart:   Medical and social history Use of alcohol, tobacco or illicit drugs  Current medications and supplements including opioid prescriptions. Patient is currently taking opioid prescriptions. Information provided to patient regarding non-opioid alternatives. Patient advised to discuss non-opioid treatment plan with their provider. Functional ability and status Nutritional status Physical activity Advanced directives List of other physicians Hospitalizations, surgeries, and ER visits in previous 12 months Vitals Screenings to include cognitive, depression, and falls Referrals and appointments  In addition, I have reviewed and discussed with patient certain preventive protocols, quality metrics, and best practice recommendations. A written personalized care plan for preventive services as well as general  preventive health recommendations were provided to patient.     Chriss Driver, LPN   04/06/9469   Nurse Notes: Discussed Colonosocpy. Pt is willing to repeat. Order placed.

## 2022-01-07 NOTE — Patient Instructions (Signed)
Jeremy [name] , Thank you for taking time to come for your Medicare Wellness Visit. I appreciate your ongoing commitment to your health goals. Please review the following plan we discussed and let me know if I can assist you in the future.   Screening recommendations/referrals: Colonoscopy: Done 09/23/2008 Repeat in 10 years Order placed today.  Recommended yearly ophthalmology/optometry visit for glaucoma screening and checkup Recommended yearly dental visit for hygiene and checkup  Vaccinations: Influenza vaccine: Done 05/22/2021 Repeat annually  Pneumococcal vaccine: Done 03/17/2020 Tdap vaccine: Done 11/11/2014 Repeat in 10 years  Shingles vaccine: Done 07/19/2018 and 10/20/2018   Covid-19: Done 09/24/2019, 10/22/2019, 08/08/2020 and 05/22/2021.  Advanced directives: Advance directive discussed with you today. Even though you declined this today, please call our office should you change your mind, and we can give you the proper paperwork for you to fill out.   Conditions/risks identified: Aim for 30 minutes of exercise or brisk walking, 6-8 glasses of water, and 5 servings of fruits and vegetables each day.   Next appointment: Follow up in one year for your annual wellness visit. 2024.  Preventive Care 67 Years and Older, Male  Preventive care refers to lifestyle choices and visits with your health care provider that can promote health and wellness. What does preventive care include? A yearly physical exam. This is also called an annual well check. Dental exams once or twice a year. Routine eye exams. Ask your health care provider how often you should have your eyes checked. Personal lifestyle choices, including: Daily care of your teeth and gums. Regular physical activity. Eating a healthy diet. Avoiding tobacco and drug use. Limiting alcohol use. Practicing safe sex. Taking low doses of aspirin every day. Taking vitamin and mineral supplements as recommended by your health care  provider. What happens during an annual well check? The services and screenings done by your health care provider during your annual well check will depend on your age, overall health, lifestyle risk factors, and family history of disease. Counseling  Your health care provider may ask you questions about your: Alcohol use. Tobacco use. Drug use. Emotional well-being. Home and relationship well-being. Sexual activity. Eating habits. History of falls. Memory and ability to understand (cognition). Work and work Statistician. Screening  You may have the following tests or measurements: Height, weight, and BMI. Blood pressure. Lipid and cholesterol levels. These may be checked every 5 years, or more frequently if you are over 50 years old. Skin check. Lung cancer screening. You may have this screening every year starting at age 16 if you have a 30-pack-year history of smoking and currently smoke or have quit within the past 15 years. Fecal occult blood test (FOBT) of the stool. You may have this test every year starting at age 27. Flexible sigmoidoscopy or colonoscopy. You may have a sigmoidoscopy every 5 years or a colonoscopy every 10 years starting at age 45. Prostate cancer screening. Recommendations will vary depending on your family history and other risks. Hepatitis C blood test. Hepatitis B blood test. Sexually transmitted disease (STD) testing. Diabetes screening. This is done by checking your blood sugar (glucose) after you have not eaten for a while (fasting). You may have this done every 1-3 years. Abdominal aortic aneurysm (AAA) screening. You may need this if you are a current or former smoker. Osteoporosis. You may be screened starting at age 38 if you are at high risk. Talk with your health care provider about your test results, treatment options, and if necessary,  the need for more tests. Vaccines  Your health care provider may recommend certain vaccines, such  as: Influenza vaccine. This is recommended every year. Tetanus, diphtheria, and acellular pertussis (Tdap, Td) vaccine. You may need a Td booster every 10 years. Zoster vaccine. You may need this after age 6. Pneumococcal 13-valent conjugate (PCV13) vaccine. One dose is recommended after age 58. Pneumococcal polysaccharide (PPSV23) vaccine. One dose is recommended after age 62. Talk to your health care provider about which screenings and vaccines you need and how often you need them. This information is not intended to replace advice given to you by your health care provider. Make sure you discuss any questions you have with your health care provider. Document Released: 09/05/2015 Document Revised: 04/28/2016 Document Reviewed: 06/10/2015 Elsevier Interactive Patient Education  2017 [place] Prevention in the Home Falls can cause injuries. They can happen to people of all ages. There are many things you can do to make your home safe and to help prevent falls. What can I do on the outside of my home? Regularly fix the edges of walkways and driveways and fix any cracks. Remove anything that might make you trip as you walk through a door, such as a raised step or threshold. Trim any bushes or trees on the path to your home. Use bright outdoor lighting. Clear any walking paths of anything that might make someone trip, such as rocks or tools. Regularly check to see if handrails are loose or broken. Make sure that both sides of any steps have handrails. Any raised decks and porches should have guardrails on the edges. Have any leaves, snow, or ice cleared regularly. Use sand or salt on walking paths during winter. Clean up any spills in your garage right away. This includes oil or grease spills. What can I do in the bathroom? Use night lights. Install grab bars by the toilet and in the tub and shower. Do not use towel bars as grab bars. Use non-skid mats or decals in the tub or  shower. If you need to sit down in the shower, use a plastic, non-slip stool. Keep the floor dry. Clean up any water that spills on the floor as soon as it happens. Remove soap buildup in the tub or shower regularly. Attach bath mats securely with double-sided non-slip rug tape. Do not have throw rugs and other things on the floor that can make you trip. What can I do in the bedroom? Use night lights. Make sure that you have a light by your bed that is easy to reach. Do not use any sheets or blankets that are too big for your bed. They should not hang down onto the floor. Have a firm chair that has side arms. You can use this for support while you get dressed. Do not have throw rugs and other things on the floor that can make you trip. What can I do in the kitchen? Clean up any spills right away. Avoid walking on wet floors. Keep items that you use a lot in easy-to-reach places. If you need to reach something above you, use a strong step stool that has a grab bar. Keep electrical cords out of the way. Do not use floor polish or wax that makes floors slippery. If you must use wax, use non-skid floor wax. Do not have throw rugs and other things on the floor that can make you trip. What can I do with my stairs? Do not leave any items on  the stairs. Make sure that there are handrails on both sides of the stairs and use them. Fix handrails that are broken or loose. Make sure that handrails are as long as the stairways. Check any carpeting to make sure that it is firmly attached to the stairs. Fix any carpet that is loose or worn. Avoid having throw rugs at the top or bottom of the stairs. If you do have throw rugs, attach them to the floor with carpet tape. Make sure that you have a light switch at the top of the stairs and the bottom of the stairs. If you do not have them, ask someone to add them for you. What else can I do to help prevent falls? Wear shoes that: Do not have high heels. Have  rubber bottoms. Are comfortable and fit you well. Are closed at the toe. Do not wear sandals. If you use a stepladder: Make sure that it is fully opened. Do not climb a closed stepladder. Make sure that both sides of the stepladder are locked into place. Ask someone to hold it for you, if possible. Clearly mark and make sure that you can see: Any grab bars or handrails. First and last steps. Where the edge of each step is. Use tools that help you move around (mobility aids) if they are needed. These include: Canes. Walkers. Scooters. Crutches. Turn on the lights when you go into a dark area. Replace any light bulbs as soon as they burn out. Set up your furniture so you have a clear path. Avoid moving your furniture around. If any of your floors are uneven, fix them. If there are any pets around you, be aware of where they are. Review your medicines with your doctor. Some medicines can make you feel dizzy. This can increase your chance of falling. Ask your doctor what other things that you can do to help prevent falls. This information is not intended to replace advice given to you by your health care provider. Make sure you discuss any questions you have with your health care provider. Document Released: 06/05/2009 Document Revised: 01/15/2016 Document Reviewed: 09/13/2014 Elsevier Interactive Patient Education  2017 [employer].

## 2022-01-08 ENCOUNTER — Telehealth: Payer: Self-pay | Admitting: Family

## 2022-01-08 ENCOUNTER — Other Ambulatory Visit: Payer: Self-pay

## 2022-01-08 MED ORDER — POTASSIUM CHLORIDE CRYS ER 10 MEQ PO TBCR: EXTENDED_RELEASE_TABLET | ORAL | 1 refills | Status: DC

## 2022-01-08 MED ORDER — METOPROLOL SUCCINATE ER 50 MG PO TB24: ORAL_TABLET | ORAL | 1 refills | Status: DC

## 2022-01-08 MED ORDER — HYDROCHLOROTHIAZIDE 25 MG PO TABS: ORAL_TABLET | ORAL | 1 refills | Status: DC

## 2022-01-08 MED ORDER — AMLODIPINE BESY-BENAZEPRIL HCL 10-40 MG PO CAPS: 1.0000 | ORAL_CAPSULE | Freq: Every day | ORAL | 1 refills | Status: DC

## 2022-01-08 NOTE — Addendum Note (Signed)
Addended by: [name] on: 01/08/2022 07:19 AM   Modules accepted: Orders

## 2022-01-08 NOTE — Telephone Encounter (Signed)
[facility] pharmacist called regarding pt's rx, pharm needs clarification on metoprolol

## 2022-01-08 NOTE — Telephone Encounter (Signed)
Resent prescription with corrected signature.

## 2022-02-03 DIAGNOSIS — M4310 Spondylolisthesis, site unspecified: Secondary | ICD-10-CM | POA: Diagnosis not present

## 2022-02-05 ENCOUNTER — Telehealth: Payer: Self-pay | Admitting: Psychiatry

## 2022-02-05 MED ORDER — TOPIRAMATE 25 MG PO TABS: 25.0000 mg | ORAL_TABLET | Freq: Two times a day (BID) | ORAL | 0 refills | Status: DC

## 2022-02-05 NOTE — Telephone Encounter (Signed)
Pt called at 9:19 am and said that the appetite suppressent that he has been taking metaformin has not been working. It just makes him nausea and he has diarrhea. He would like to try something different. Please give him a call at [phone]

## 2022-02-05 NOTE — Telephone Encounter (Signed)
Rx sent and patient notified.

## 2022-02-05 NOTE — Telephone Encounter (Signed)
He can stop the metformin since it is making him sick.  Once the SE are gone I can try topiramate 25 mg BID for a week then 50 mg BID, 25 mg tabs, #120, no refill

## 2022-02-05 NOTE — Telephone Encounter (Signed)
From Dr. Casimiro Needle 4/18 note:  Increase metoformin to 1000 mg BID and if no benefit will stop it.  No benefit at 500 mg BID.  Patient asking for another medication. The increased metformin has not been helpful.    Jeremy [name]

## 2022-02-05 NOTE — Telephone Encounter (Signed)
From Dr. Casimiro Needle 4/18 note:  Increase metoformin to 1000 mg BID and if no benefit will stop it.  No benefit at 500 mg BID

## 2022-02-10 DIAGNOSIS — K921 Melena: Secondary | ICD-10-CM | POA: Diagnosis not present

## 2022-02-10 DIAGNOSIS — K319 Disease of stomach and duodenum, unspecified: Secondary | ICD-10-CM | POA: Diagnosis not present

## 2022-02-22 DIAGNOSIS — K635 Polyp of colon: Secondary | ICD-10-CM | POA: Diagnosis not present

## 2022-02-22 DIAGNOSIS — D125 Benign neoplasm of sigmoid colon: Secondary | ICD-10-CM | POA: Diagnosis not present

## 2022-02-22 DIAGNOSIS — K921 Melena: Secondary | ICD-10-CM | POA: Diagnosis not present

## 2022-02-22 DIAGNOSIS — K648 Other hemorrhoids: Secondary | ICD-10-CM | POA: Diagnosis not present

## 2022-02-25 ENCOUNTER — Other Ambulatory Visit: Payer: Self-pay | Admitting: Psychiatry

## 2022-02-25 NOTE — Telephone Encounter (Signed)
Wife will have patient call to verify dosing.

## 2022-02-25 NOTE — Telephone Encounter (Signed)
Pt returned phone call [phone]

## 2022-03-02 ENCOUNTER — Other Ambulatory Visit: Payer: Self-pay | Admitting: Psychiatry

## 2022-03-02 DIAGNOSIS — T50905A Adverse effect of unspecified drugs, medicaments and biological substances, initial encounter: Secondary | ICD-10-CM

## 2022-03-02 DIAGNOSIS — F333 Major depressive disorder, recurrent, severe with psychotic symptoms: Secondary | ICD-10-CM

## 2022-03-08 ENCOUNTER — Telehealth: Payer: Self-pay | Admitting: Family

## 2022-03-08 ENCOUNTER — Other Ambulatory Visit: Payer: Self-pay

## 2022-03-08 MED ORDER — FUROSEMIDE 20 MG PO TABS: ORAL_TABLET | ORAL | 0 refills | Status: DC

## 2022-03-08 NOTE — Telephone Encounter (Signed)
Rx sent , patient scheduled to come in for 6 months follow up

## 2022-03-08 NOTE — Telephone Encounter (Signed)
Medication: furosemide (LASIX) 20 MG tablet  Has the patient contacted their pharmacy? No.   Preferred Pharmacy:  [facility] [address], [place], [place] [address]

## 2022-03-10 ENCOUNTER — Encounter (INDEPENDENT_AMBULATORY_CARE_PROVIDER_SITE_OTHER): Payer: Medicare Other | Admitting: Ophthalmology

## 2022-03-10 ENCOUNTER — Encounter: Payer: Self-pay | Admitting: Psychiatry

## 2022-03-10 ENCOUNTER — Ambulatory Visit: Payer: Medicare Other | Admitting: Psychiatry

## 2022-03-10 DIAGNOSIS — H43813 Vitreous degeneration, bilateral: Secondary | ICD-10-CM | POA: Diagnosis not present

## 2022-03-10 DIAGNOSIS — F4001 Agoraphobia with panic disorder: Secondary | ICD-10-CM

## 2022-03-10 DIAGNOSIS — R635 Abnormal weight gain: Secondary | ICD-10-CM | POA: Diagnosis not present

## 2022-03-10 DIAGNOSIS — F3340 Major depressive disorder, recurrent, in remission, unspecified: Secondary | ICD-10-CM

## 2022-03-10 DIAGNOSIS — I10 Essential (primary) hypertension: Secondary | ICD-10-CM | POA: Diagnosis not present

## 2022-03-10 DIAGNOSIS — D3132 Benign neoplasm of left choroid: Secondary | ICD-10-CM

## 2022-03-10 DIAGNOSIS — R632 Polyphagia: Secondary | ICD-10-CM

## 2022-03-10 DIAGNOSIS — F333 Major depressive disorder, recurrent, severe with psychotic symptoms: Secondary | ICD-10-CM

## 2022-03-10 DIAGNOSIS — F411 Generalized anxiety disorder: Secondary | ICD-10-CM

## 2022-03-10 DIAGNOSIS — H35033 Hypertensive retinopathy, bilateral: Secondary | ICD-10-CM

## 2022-03-10 MED ORDER — TOPIRAMATE 25 MG PO TABS: 75.0000 mg | ORAL_TABLET | Freq: Two times a day (BID) | ORAL | 1 refills | Status: DC

## 2022-03-10 MED ORDER — ARIPIPRAZOLE 5 MG PO TABS: 5.0000 mg | ORAL_TABLET | Freq: Every day | ORAL | 0 refills | Status: DC

## 2022-03-10 NOTE — Progress Notes (Signed)
Jeremy [name] [id number] [birth year] 66 y.o.     Subjective:   Patient ID:  Jeremy [name] is a 66 y.o. (DOB Jun 19, 1956) male.  Chief Complaint:  Chief Complaint  Patient presents with   Follow-up   Depression   Anxiety   Medication Problem    Depression        Associated symptoms include no decreased concentration and no suicidal ideas.  Past medical history includes anxiety.   Anxiety Patient reports no chest pain, confusion, decreased concentration, nervous/anxious behavior, palpitations, shortness of breath or suicidal ideas.      Jeremy [name] presents  today for follow-up of anxiety and depression.   He had been out on disability due to his back since September at that time.  seen April 2020.  Since retirement he has been under less stress and had asked to reduce and hopefully eliminate some medications.  We reduced Abilify from 7.5 to 5 mg daily.  As of December 04, 2019 the following is noted: Still good. nOTHing worse with dose reduction.   Benefit from meds. Help depression, anxiety and anger.  They are all under control. No mood swings nor prolonged depression.  Anxiety OK.  Wants to try to reduce meds again if possible bc less stress as noted. Plan because of sexual side effects with sertraline he prefers to reduce sertraline to 50 mg daily and continue Abilify 5 mg daily.  02/21/2020 appointment with the following noted: Doing well still after reducing sertraline to 50 mg daily but no improvement in sexual function.  depression and anxiety  Are still under control. No panic.   Plan:  We discussed the options of trying to eliminate gradually the Abilify versus reducing the sertraline which is probably more risky.  However he is having sexual side effects from sertraline and no side effects from Abilify.  He prefers to stop the sertraline and see if he can get by without it. Therefore reduce sertraline to 25 mg daily for 2 weeks then stop it  05/07/20 appt with the  following noted: Off sertraline without more depression or anxiety.  No panic.  No withdrawal effects. Sexual function is not better.  Erection problems. Tried Viagra without help.  Saw urologist and had shots which helped then stopped helping. He wants to try stopping Abilify to see if sexual function will return. Plan: Since retiring his stress level has reduced significantly.  His depression and anxiety are under control.  He wants to try to reduce medications again.   He wants to try DC Abilify to see if sexual function is better despite risk relapse. He's been fine so far off sertraline.    06/02/2020 phone call from patient:Pt called and said that he had a panic attack yesterday. He wants to know if he can go back on his medications MD response: Fairly recently discontinued sertraline 50 mg daily.  That is the best antipanic med.  Have him restart sertraline 25 mg daily for 7 days and then 50 mg daily.  Will take several weeks to help.  Hold off the Abilify bc that was for depression. 06/13/20 TC: Pt LM on VM needing apt today to see CC. Returned call, Pt stated he is still having issues with anxiety and needs to see CC today. His follow up apt is 12/13.   2 panic attacks but close today.  06/16/2020 urgent appointment with the following noted: Seen with wife Panic and wanted to restart meds.  Wife says he's more  depressed.  Only sleeps and eats and is like a walking zombie without meds. Lost father and stepmother and friend.  She thinks he's depressed.  B committed suicide years ago.   Now he thinks I'm going to leave him. Family notices his depression.  Having to help adopted father with cancer. He agrees with wife. Tense and SOB with anxiety. Wife thinks he represses things.  Mo left him in an orphanage and has abandonment issues even with her.  Thinking she will leave. Plan: Relapse worse off meds  Increase sertraline to 100 mg daily.  May need to go higher Increase Abilify to 10 mg  daily for severe depression with paranoid rumination. OK prn Xanax prn panic   08/04/20 appt with following noted:  Seen with wife, [name] Better with tension.  Feels less down too.   SE increased appetite.  But had recently lost 20 # and lately snacking more. Wife sees dramatic turnaround and he's more talkative and engaged and friends notice too.  It's working. No Xanax used.  Staying awake during day.   Not quite back to normal but really improved per wife.   Plan: If he is back to baseline at follow-up we will consider reducing the Abilify somewhat in order to reduce the weight gain potential if possible.  10/07/2020 appointment with following noted: Still sleeping too much and eating a lot.  Otherwise is OK.  Doesn't feel depressed.  Interest is a little better.  Not anxious.    Not aware of SE otherwise.  Not worrying too much.  Has retired.  Has reduced his stress significantly. Using CPAP. No panic. Plan: Continue sertraline 100 mg daily Reduce Abilify to 5 mg daily for the history of severe paranoid rumination and depression which are improved, but apparently it's sedating him.  12/11/2020 appointment with the following noted: Reduced Abilify to 5 mg daily without much change.   Mood been alright withuot depression.  Anxiety is not a problem. Sleep 9 hours at night and then naps in the morning after breakfast and then in afternoon.  Not much to do.  Not much he wants to do in retirement except golf and physical limitations hurt that. No other hobbies.     Weight stable.  He stopped the Abilify couple of weeks ago bc insurance told him they would not cover it and it would be $800 dollars. Plan:  Continue sertraline 100 mg daily He stopped Abilify 5 mg 2 weeks ago DT cost Disc risk relapse.  Call if relapse bc at risk for a couple of mos.  03/24/2021 appointment with the following noted:  Seen with daughter, Delsa [name] Patient stopped Abilify 2 weeks prior to last appointment due to being  told it would be $800.  He wanted to stay off the medication and was doing okay at the time of appointment. Taking sertraline 150 mg daily. Xanax makes him too sleepy. Alright.  A little bit of anxiety.  Worrying about things with wife's problems with her brother's death.   Delsa [name] notices his anxiety is out of control.  He's constantly hovering her and obsessing she may leave him again as in the past.  Definitely not himself and family notices not interacting with people.  She's noticed changes worsening over the year. B in law died Jan 05, 2023 making things worse. Sleep is interrupted.  He recognizes he's more fearful than normal. Is sad also. Pending back surgery 03/31/2021 Plan: Because he relapsed again off of Abilify we will do the  following: Continue sertraline 150 mg daily Restart Abilify 10 mg daily bc did best on it.  06/08/21 appt noted: Better with Abilify and SE hungry with weight gain of ? Amount. Rubs teeth front together at rest. No depression and anxiety now.  Patient reports stable mood and denies depressed or irritable moods.  Patient denies any recent difficulty with anxiety.  Patient denies difficulty with sleep initiation or maintenance. Denies appetite disturbance.  Patient reports that energy and motivation have been good.  Patient denies any difficulty with concentration.  Patient denies any suicidal ideation. Back pain not gone but better after surgery He remains on  Continue sertraline 150 mg daily and Abilify 10 Plan: Continue sertraline 150 mg daily Continue Abilify but we will reduce to 5 mg bc hunger and rubbing teeth together may be SE More paranoid off Abilify.  09/08/2021 appointment noted: Been doing good since here after reduction to Abilify 5 mg daily.  Thinks Abilify SE hunger and weight gain. Rubbing teeth together stopped with reduction in dose. Relapsed  Continues sertraline 150 mg daily. Plan: Continue sertraline 150 mg daily Continue Abilify 5 mg  bc hunger  and rubbing teeth together may be SE More paranoid off Abilify.  12/08/2021 appointment with the following noted: Back surgery August and it still bothers him to walk.  No exercise.  No weight loss. Good with depression.  F in law died last night with complications of CA and hip fx.  He was good man.   Anxiety also been ok without fear or paranoia. SE occ diarrhea and nothing else. Sleep is good.   Wife [name] with problems psych dealing with brother's death and F's cancer. Plan: Increase metoformin to 1000 mg BID and if no benefit will stop it.  No benefit at 500 mg BID Continue sertraline 150 mg daily. Continue Abilify 5 mg daily because he was paranoid off of it  03/10/2022 appointment with the following noted: Had too much nausea on the higher dose of metformin and stopped it Wanted to try topiramate to see if it helped with his hunger problems and started 25 mg twice daily to then increase to 50 mg twice daily With topiramate lost a couple of # but seemed it quit working.  No SE with it. Mood been alright I guess.  Not depressed and no panic. No fear.  Sleep is good. Wants to lose mor eweight if possible.  Past history of psychiatric disability. Not sure how many episodes of depression he's had in life.  Historically about the same levels of depression and anxiety   Past Psychiatric Medication Trials: Sertraline 100 Abilify 10 helped SE weight gain Xanax 0.5  Review of Systems:  Review of Systems  Constitutional:  Positive for unexpected weight change.  Respiratory:  Negative for shortness of breath.   Cardiovascular:  Negative for chest pain and palpitations.  Genitourinary:        ED persistent  Musculoskeletal:  Positive for arthralgias and back pain.  Neurological:  Negative for tremors.  Psychiatric/Behavioral:  Negative for agitation, behavioral problems, confusion, decreased concentration, dysphoric mood, hallucinations, self-injury, sleep disturbance and suicidal ideas.  The patient is not nervous/anxious and is not hyperactive.     Medications: I have reviewed the patient's current medications.  Current Outpatient Medications  Medication Sig Dispense Refill   amLODipine-benazepril (LOTREL) 10-40 MG capsule Take 1 capsule by mouth daily. 90 capsule 1   ARIPiprazole (ABILIFY) 5 MG tablet Take 1 tablet (5 mg total) by mouth daily.  90 tablet 0   ascorbic acid (VITAMIN C) 500 MG tablet Take by mouth.     b complex vitamins tablet Take 1 tablet by mouth daily.     ferrous sulfate 325 (65 FE) MG tablet Take 1 tablet (325 mg total) by mouth 2 (two) times daily with a meal. 60 tablet 0   fexofenadine (ALLEGRA) 180 MG tablet Take 1 tablet (180 mg total) by mouth daily. 30 tablet 0   furosemide (LASIX) 20 MG tablet Take 1 tablet by mouth daily 90 tablet 0   hydrochlorothiazide (HYDRODIURIL) 25 MG tablet TAKE 1 TABLET(25 MG) BY MOUTH DAILY 90 tablet 1   lidocaine (LIDODERM) 5 % 1 patch daily.     methocarbamol (ROBAXIN) 500 MG tablet Take by mouth.     metoprolol succinate (TOPROL-XL) 50 MG 24 hr tablet Take one tablet daily ('50MG'$ ) with or immediately following a meal. 90 tablet 1   Multiple Vitamin (MULTIVITAMIN WITH MINERALS) TABS tablet Take 1 tablet by mouth daily.     omeprazole (PRILOSEC) 20 MG capsule Take 1 capsule (20 mg total) by mouth daily. 30 capsule 3   oxyCODONE (OXY IR/ROXICODONE) 5 MG immediate release tablet Take by mouth.     potassium chloride (KLOR-CON M) 10 MEQ tablet Take 2 tablets by mouth in the AM and 1 tablet by mouth in the PM 270 tablet 1   sertraline (ZOLOFT) 100 MG tablet Take 1.5 tablets (150 mg total) by mouth daily. 135 tablet 0   topiramate (TOPAMAX) 25 MG tablet Take 2 tablets (50 mg total) by mouth 2 (two) times daily. 120 tablet 0   No current facility-administered medications for this visit.    Medication Side Effects: ED with Zoloft.  Viagra failed.  Using shots.  Allergies: No Known Allergies  Past Medical History:   Diagnosis Date   Anemia 11/16/2014   Anxiety    Aortic aneurysm (HCC)    4.1 cm 2019, ascending aorta   Bladder disease    "an unusual bladder disease; don't know what it's called" (06/30/2015)   Bulging lumbar disc    COVID-19 01/09/2021   Depression    GERD (gastroesophageal reflux disease)    History of hiatal hernia    Hyperglycemia 11/16/2014   Hyperlipemia    Hypertension    Joint pain, knee    Migraine    06/30/2015 "maybe 2-3 times/year; not as bad as I used to have them"   OSA (obstructive sleep apnea)     Family History  Problem Relation Age of Onset   Stroke Mother 45       Died of CVA   Hypertension Mother    Anesthesia problems Neg Hx    Hypotension Neg Hx    Malignant hyperthermia Neg Hx    Pseudochol deficiency Neg Hx     Social History   Socioeconomic History   Marital status: Married    Spouse name: Not on file   Number of children: 3   Years of education: Not on file   Highest education level: Not on file  Occupational History   Not on file  Tobacco Use   Smoking status: Former    Packs/day: 1.50    Years: 6.00    Total pack years: 9.00    Types: Cigarettes   Smokeless tobacco: Never   Tobacco comments:    "stopped smoking in ~ 1990"  Substance and Sexual Activity   Alcohol use: No    Alcohol/week: 0.0 standard drinks of alcohol  Drug use: No   Sexual activity: Yes  Other Topics Concern   Not on file  Social History Narrative   3 children    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, research (life sciences))   Married   Completed 12th grade   Enjoys softball   Social Determinants of Radio broadcast assistant Strain: Low Risk  (01/07/2022)   Overall Financial Resource Strain (CARDIA)    Difficulty of Paying Living Expenses: Not hard at all  Food Insecurity: No Food Insecurity (01/07/2022)   Hunger Vital Sign    Worried About Running Out of Food in the Last Year: Never true    [place] in the Last Year: Never true  Transportation  Needs: No Transportation Needs (01/07/2022)   PRAPARE - Hydrologist (Medical): No    Lack of Transportation (Non-Medical): No  Physical Activity: Inactive (01/07/2022)   Exercise Vital Sign    Days of Exercise per Week: 0 days    Minutes of Exercise per Session: 0 min  Stress: No Stress Concern Present (01/07/2022)   [place]    Feeling of Stress : Not at all  Social Connections: [place] (01/07/2022)   Social Connection and Isolation Panel [NHANES]    Frequency of Communication with Friends and Family: More than three times a week    Frequency of Social Gatherings with Friends and Family: More than three times a week    Attends Religious Services: More than 4 times per year    Active Member of [employer] or Organizations: Yes    Attends Archivist Meetings: More than 4 times per year    Marital Status: Married  Human resources officer Violence: Not At Risk (01/07/2022)   Humiliation, Afraid, Rape, and Kick questionnaire    Fear of Current or Ex-Partner: No    Emotionally Abused: No    Physically Abused: No    Sexually Abused: No    Past Medical History, Surgical history, Social history, and Family history were reviewed and updated as appropriate.   Please see review of systems for further details on the patient's review from today.   Objective:   Physical Exam:  There were no vitals taken for this visit.  Physical Exam Constitutional:      General: He is not in acute distress.    Appearance: He is well-developed.  Musculoskeletal:        General: No deformity.  Neurological:     Mental Status: He is alert and oriented to person, place, and time.     Motor: No tremor.     Coordination: Coordination normal.     Gait: Gait normal.  Psychiatric:        Attention and Perception: Attention and perception normal.        Mood and Affect: Mood is not anxious or depressed.  Affect is not labile, blunt or flat.        Speech: Speech normal.        Behavior: Behavior is not slowed.        Thought Content: Thought content is not paranoid. Thought content does not include homicidal or suicidal ideation. Thought content does not include suicidal plan.        Cognition and Memory: Cognition normal.        Judgment: Judgment normal.     Comments: Insight intact. No auditory or visual hallucinations.     Lab Review:  Component Value Date/Time   NA 143 08/25/2021 1405   K 3.8 08/25/2021 1405   CL 104 08/25/2021 1405   CO2 31 08/25/2021 1405   GLUCOSE 130 (H) 08/25/2021 1405   BUN 16 08/25/2021 1405   CREATININE 0.94 08/25/2021 1405   CREATININE 1.05 01/13/2018 1542   CALCIUM 9.3 08/25/2021 1405   PROT 6.4 09/23/2020 0949   ALBUMIN 4.1 09/23/2020 0949   AST 21 09/23/2020 0949   ALT 29 09/23/2020 0949   ALKPHOS 75 09/23/2020 0949   BILITOT 0.4 09/23/2020 0949   GFRNONAA >60 03/26/2021 1259   GFRAA >60 10/25/2017 0438       Component Value Date/Time   WBC 7.4 08/25/2021 1405   RBC 4.61 08/25/2021 1405   HGB 13.8 08/25/2021 1405   HCT 41.4 08/25/2021 1405   PLT 270.0 08/25/2021 1405   MCV 89.8 08/25/2021 1405   MCH 31.6 03/26/2021 1259   MCHC 33.3 08/25/2021 1405   RDW 13.9 08/25/2021 1405   LYMPHSABS 2.3 08/25/2021 1405   MONOABS 0.6 08/25/2021 1405   EOSABS 0.2 08/25/2021 1405   BASOSABS 0.1 08/25/2021 1405    No results found for: "POCLITH", "LITHIUM"   No results found for: "PHENYTOIN", "PHENOBARB", "VALPROATE", "CBMZ"   .res Assessment: Plan:    Major depressive disorder, recurrent, in remission ([place])  Panic disorder with agoraphobia -- in remission  Generalized anxiety disorder  Weight gain due to medication  Increased appetite Erectile 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 We discussed [name] has a history of  severe depression and panic disorder and anxiety that caused him to go out on disability in 2019. He relapsed after retirement off meds. Relapsed again off Abilify in August 2022 and resolved on 10 mg again. However he had weight gain and possibly some EPS. He had good response to the combination of Zoloft 150 and Abilify  10 mg.  Relapse worse off meds. He's much too inactive in retirement.  Emphasized need to be more active in retirment otherwise health will decline including mental health.  Wife busy in wildlife rehab.   Disc getting b ack into therapy also  Continue sertraline 150 mg daily Continue Abilify 5 mg  bc hunger and rubbing teeth together may be SE More paranoid off Abilify. None current.  Disc risk. Disc Good RX  Increase topiramate to 75 mg BID for wt control and if NR after a couple of weeks then can increase to 100 mg BID  Discussed potential metabolic side effects associated with atypical antipsychotics, as well as potential risk for movement side effects. Advised pt to contact office if movement side effects occur.  Disc diet and exercise.    Follow-up 3-4 mos  Lynder Parents, MD, DFAPA  Please see After Visit Summary for patient specific instructions.  Future Appointments  Date Time Provider [place]  03/15/2022 10:00 AM [name], NP [facility]  01/11/2023  3:00 PM [facility] COACH [facility]    No orders of the defined types were placed in this encounter.      -------------------------------

## 2022-03-12 ENCOUNTER — Other Ambulatory Visit: Payer: Self-pay

## 2022-03-12 MED ORDER — POTASSIUM CHLORIDE CRYS ER 10 MEQ PO TBCR: EXTENDED_RELEASE_TABLET | ORAL | 1 refills | Status: DC

## 2022-03-15 ENCOUNTER — Ambulatory Visit (INDEPENDENT_AMBULATORY_CARE_PROVIDER_SITE_OTHER): Payer: Medicare Other | Admitting: Family

## 2022-03-15 VITALS — BP 120/64 | HR 64 | Temp 98.3°F | Resp 16 | Wt 264.0 lb

## 2022-03-15 DIAGNOSIS — K219 Gastro-esophageal reflux disease without esophagitis: Secondary | ICD-10-CM

## 2022-03-15 DIAGNOSIS — R739 Hyperglycemia, unspecified: Secondary | ICD-10-CM | POA: Diagnosis not present

## 2022-03-15 DIAGNOSIS — D509 Iron deficiency anemia, unspecified: Secondary | ICD-10-CM

## 2022-03-15 DIAGNOSIS — I5032 Chronic diastolic (congestive) heart failure: Secondary | ICD-10-CM

## 2022-03-15 DIAGNOSIS — I10 Essential (primary) hypertension: Secondary | ICD-10-CM | POA: Diagnosis not present

## 2022-03-15 DIAGNOSIS — F419 Anxiety disorder, unspecified: Secondary | ICD-10-CM

## 2022-03-15 DIAGNOSIS — L989 Disorder of the skin and subcutaneous tissue, unspecified: Secondary | ICD-10-CM | POA: Diagnosis not present

## 2022-03-15 DIAGNOSIS — G4733 Obstructive sleep apnea (adult) (pediatric): Secondary | ICD-10-CM

## 2022-03-15 DIAGNOSIS — I719 Aortic aneurysm of unspecified site, without rupture: Secondary | ICD-10-CM

## 2022-03-15 DIAGNOSIS — F32A Depression, unspecified: Secondary | ICD-10-CM

## 2022-03-15 LAB — BASIC METABOLIC PANEL
BUN: 13 mg/dL (ref 6–23)
CO2: 29 mEq/L (ref 19–32)
Calcium: 9.3 mg/dL (ref 8.4–10.5)
Chloride: 104 mEq/L (ref 96–112)
Creatinine, Ser: 1.08 mg/dL (ref 0.40–1.50)
GFR: 71.12 mL/min (ref >60.00)
Glucose, Bld: 90 mg/dL (ref 70–99)
Potassium: 3.9 mEq/L (ref 3.5–5.1)
Sodium: 140 mEq/L (ref 135–145)

## 2022-03-15 LAB — CBC WITH DIFFERENTIAL/PLATELET
Basophils Absolute: 0.1 10*3/uL (ref 0.0–0.1)
Basophils Relative: 1 % (ref 0.0–3.0)
Eosinophils Absolute: 0.2 10*3/uL (ref 0.0–0.7)
Eosinophils Relative: 2.8 % (ref 0.0–5.0)
HCT: 39.7 % (ref 39.0–52.0)
Hemoglobin: 13.4 g/dL (ref 13.0–17.0)
Lymphocytes Relative: 27.6 % (ref 12.0–46.0)
Lymphs Abs: 2.1 10*3/uL (ref 0.7–4.0)
MCHC: 33.7 g/dL (ref 30.0–36.0)
MCV: 92.2 fl (ref 78.0–100.0)
Monocytes Absolute: 0.6 10*3/uL (ref 0.1–1.0)
Monocytes Relative: 8.5 % (ref 3.0–12.0)
Neutro Abs: 4.5 10*3/uL (ref 1.4–7.7)
Neutrophils Relative %: 60.1 % (ref 43.0–77.0)
Platelets: 267 10*3/uL (ref 150.0–400.0)
RBC: 4.3 Mil/uL (ref 4.22–5.81)
RDW: 13.7 % (ref 11.5–15.5)
WBC: 7.4 10*3/uL (ref 4.0–10.5)

## 2022-03-15 LAB — HEMOGLOBIN A1C: Hgb A1c MFr Bld: 5.4 % (ref 4.6–6.5)

## 2022-03-15 LAB — IRON: Iron: 66 ug/dL (ref 42–165)

## 2022-03-15 NOTE — Assessment & Plan Note (Signed)
Reports good compliance with cpap.

## 2022-03-15 NOTE — Assessment & Plan Note (Signed)
Stable on omeprazole, continue same. 

## 2022-03-15 NOTE — Progress Notes (Signed)
Subjective:   By signing my name below, I, [name], attest that this documentation has been prepared under the direction and in the presence of [name], NP. 03/15/2022     [name], male    DOB: [birth year], 66 y.o.   MRN: [id number]  Chief Complaint  Patient presents with   Hypertension    Here for follow up     Hypertension   Patient is in today for a follow up visit.   Sun spot- He complains of a mildly painful sun spot on his forehead. It is occasionally painful but does not bother him most of the time. He does not see dermatologist regularly and is interested in receiving a referral.  Blood pressure- His blood pressure is doing well during this visit. He continues taking 10-40 mg amlodipine-Lotrel daily PO, 30 mg furosemide daily PO, 25 mg hydrochlorothiazide daily PO, 50 mg metoprolol succinate daily PO, 20 meq Klor-con M daily PO and reports no new issues while taking them.  BP Readings from Last 3 Encounters:  03/15/22 120/64  08/25/21 132/65  04/01/21 102/87   Pulse Readings from Last 3 Encounters:  03/15/22 64  08/25/21 76  04/01/21 75   Iron- He continues taking iron supplements regularly and reports no new issues while taking it.   Lab Results  Component Value Date   IRON 72 08/25/2021   FERRITIN 147.9 08/25/2021   Psychiatry- He continues following up with his psychiatrist and reports doing well while seeing them.   Blood sugar- His blood sugars looked good during last visit.  Lab Results  Component Value Date   HGBA1C 5.5 09/23/2020   Weight- His weight has gone up since last visit and he reports eating more often and doing less exercise. He is cleared to participate in physical activity from his surgeon. Wt Readings from Last 3 Encounters:  03/15/22 264 lb (119.7 kg)  01/07/22 255 lb (115.7 kg)  08/25/21 255 lb 3.2 oz (115.8 kg)   Heart burn- He reports no new episode of heart burn while taking omeprazole.   CPAP-  He continues using a CPAP machine and reports no new issues while using it.   Colonoscopy- He reports completing a colonoscopy earlier this month. He has not heard back about the polyp results.  He is instructed to reach out to his gastroenterologist about these results.   Health Maintenance Due  Topic Date Due   COVID-19 Vaccine (4 - Moderna series) 09/21/2021    Past Medical History:  Diagnosis Date   Anemia 11/16/2014   Anxiety    Aortic aneurysm (HCC)    4.1 cm 2019, ascending aorta   Bladder disease    "an unusual bladder disease; don't know what it's called" (06/30/2015)   Bulging lumbar disc    COVID-19 01/09/2021   Depression    GERD (gastroesophageal reflux disease)    History of hiatal hernia    Hyperglycemia 11/16/2014   Hyperlipemia    Hypertension    Joint pain, knee    Migraine    06/30/2015 "maybe 2-3 times/year; not as bad as I used to have them"   OSA (obstructive sleep apnea)     Past Surgical History:  Procedure Laterality Date   ESOPHAGOGASTRODUODENOSCOPY     ESOPHAGOGASTRODUODENOSCOPY (EGD) WITH ESOPHAGEAL DILATION  ~ 2014   JOINT REPLACEMENT     right knee   KNEE ARTHROSCOPY Right 08/23/1993   NASAL SEPTUM SURGERY  ~ 1972   TOTAL KNEE ARTHROPLASTY  10/11/2011   Procedure: TOTAL KNEE ARTHROPLASTY;  Surgeon: Rudean Haskell, MD;  Location: [place];  Service: Orthopedics;  Laterality: Right;   TRANSFORAMINAL LUMBAR INTERBODY FUSION W/ MIS 1 LEVEL Right 03/31/2021   Procedure: Right Lumbar five Sacral one Minimally invasive transforaminal lumbar interbody fusion;  Surgeon: Judith Part, MD;  Location: [facility];  Service: Neurosurgery;  Laterality: Right;   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)  ~2014    Family History  Problem Relation Age of Onset   Stroke Mother 67       Died of CVA   Hypertension Mother    Anesthesia problems Neg Hx    Hypotension Neg Hx    Malignant hyperthermia Neg Hx    Pseudochol deficiency Neg  Hx     Social History   Socioeconomic History   Marital status: Married    Spouse name: Not on file   Number of children: 3   Years of education: Not on file   Highest education level: Not on file  Occupational History   Not on file  Tobacco Use   Smoking status: Former    Packs/day: 1.50    Years: 6.00    Total pack years: 9.00    Types: Cigarettes   Smokeless tobacco: Never   Tobacco comments:    "stopped smoking in ~ 1990"  Substance and Sexual Activity   Alcohol use: No    Alcohol/week: 0.0 standard drinks of alcohol   Drug use: No   Sexual activity: Yes  Other Topics Concern   Not on file  Social History Narrative   3 children    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, research (life sciences))   Married   Completed 12th grade   Enjoys softball   Social Determinants of Radio broadcast assistant Strain: Low Risk  (01/07/2022)   Overall Financial Resource Strain (CARDIA)    Difficulty of Paying Living Expenses: Not hard at all  Food Insecurity: No Food Insecurity (01/07/2022)   Hunger Vital Sign    Worried About Running Out of Food in the Last Year: Never true    Ran Out of Food in the Last Year: Never true  Transportation Needs: No Transportation Needs (01/07/2022)   PRAPARE - Hydrologist (Medical): No    Lack of Transportation (Non-Medical): No  Physical Activity: Inactive (01/07/2022)   Exercise Vital Sign    Days of Exercise per Week: 0 days    Minutes of Exercise per Session: 0 min  Stress: No Stress Concern Present (01/07/2022)   [place]    Feeling of Stress : Not at all  Social Connections: [place] (01/07/2022)   Social Connection and Isolation Panel [NHANES]    Frequency of Communication with Friends and Family: More than three times a week    Frequency of Social Gatherings with Friends and Family: More than three times a week    Attends Religious  Services: More than 4 times per year    Active Member of [employer] or Organizations: Yes    Attends Archivist Meetings: More than 4 times per year    Marital Status: Married  Human resources officer Violence: Not At Risk (01/07/2022)   Humiliation, Afraid, Rape, and Kick questionnaire    Fear of Current or Ex-Partner: No    Emotionally Abused: No    Physically Abused: No    Sexually Abused: No  Outpatient Medications Prior to Visit  Medication Sig Dispense Refill   amLODipine-benazepril (LOTREL) 10-40 MG capsule Take 1 capsule by mouth daily. 90 capsule 1   ARIPiprazole (ABILIFY) 5 MG tablet Take 1 tablet (5 mg total) by mouth daily. 90 tablet 0   ascorbic acid (VITAMIN C) 500 MG tablet Take by mouth.     b complex vitamins tablet Take 1 tablet by mouth daily.     ferrous sulfate 325 (65 FE) MG tablet Take 1 tablet (325 mg total) by mouth 2 (two) times daily with a meal. 60 tablet 0   fexofenadine (ALLEGRA) 180 MG tablet Take 1 tablet (180 mg total) by mouth daily. 30 tablet 0   furosemide (LASIX) 20 MG tablet Take 1 tablet by mouth daily 90 tablet 0   hydrochlorothiazide (HYDRODIURIL) 25 MG tablet TAKE 1 TABLET(25 MG) BY MOUTH DAILY 90 tablet 1   methocarbamol (ROBAXIN) 500 MG tablet Take by mouth.     metoprolol succinate (TOPROL-XL) 50 MG 24 hr tablet Take one tablet daily ('50MG'$ ) with or immediately following a meal. 90 tablet 1   Multiple Vitamin (MULTIVITAMIN WITH MINERALS) TABS tablet Take 1 tablet by mouth daily.     omeprazole (PRILOSEC) 20 MG capsule Take 1 capsule (20 mg total) by mouth daily. 30 capsule 3   potassium chloride (KLOR-CON M) 10 MEQ tablet Take 2 tablets by mouth in the AM and 1 tablet by mouth in the PM 270 tablet 1   sertraline (ZOLOFT) 100 MG tablet Take 1.5 tablets (150 mg total) by mouth daily. 135 tablet 0   topiramate (TOPAMAX) 25 MG tablet Take 3 tablets (75 mg total) by mouth 2 (two) times daily. 180 tablet 1   lidocaine (LIDODERM) 5 % 1 patch daily.      oxyCODONE (OXY IR/ROXICODONE) 5 MG immediate release tablet Take by mouth.     No facility-administered medications prior to visit.    No Known Allergies  Review of Systems  Gastrointestinal:  Negative for heartburn.  Skin:        (+)occasionally painful sun spot on forehead       Objective:    Physical Exam Constitutional:      General: He is not in acute distress.    Appearance: Normal appearance. He is not ill-appearing.  HENT:     Head: Normocephalic and atraumatic.     Right Ear: External ear normal.     Left Ear: External ear normal.  Eyes:     Extraocular Movements: Extraocular movements intact.     Pupils: Pupils are equal, round, and reactive to light.  Cardiovascular:     Rate and Rhythm: Normal rate and regular rhythm.     Heart sounds: Normal heart sounds. No murmur heard.    No gallop.  Pulmonary:     Effort: Pulmonary effort is normal. No respiratory distress.     Breath sounds: Normal breath sounds. No wheezing or rales.  Skin:    General: Skin is warm and dry.     Comments: Raised hyperpigmented lesion on forehead   Neurological:     Mental Status: He is alert and oriented to person, place, and time.  Psychiatric:        Judgment: Judgment normal.     BP 120/64 (BP Location: Right Arm, Patient Position: Sitting, Cuff Size: Large)   Pulse 64   Temp 98.3 F (36.8 C) (Oral)   Resp 16   Wt 264 lb (119.7 kg)   SpO2 97%  BMI 42.61 kg/m  Wt Readings from Last 3 Encounters:  03/15/22 264 lb (119.7 kg)  01/07/22 255 lb (115.7 kg)  08/25/21 255 lb 3.2 oz (115.8 kg)       Assessment & Plan:   Problem List Items Addressed This Visit       Unprioritized   Skin lesion - Primary    New. Refer to dermatology.       Relevant Orders   Ambulatory referral to Dermatology   OSA (obstructive sleep apnea)    Reports good compliance with cpap.       Hyperglycemia    Lab Results  Component Value Date   HGBA1C 5.5 09/23/2020         Relevant Orders   Hemoglobin A1c   HTN (hypertension)    BP Readings from Last 3 Encounters:  03/15/22 120/64  08/25/21 132/65  04/01/21 102/87  BP at goal on Lotrel, hctz toprol xl. Cotinue same.       Relevant Orders   Basic metabolic panel   GERD (gastroesophageal reflux disease)    Stable on omeprazole, continue same.       Chronic diastolic heart failure (HCC)    Wt Readings from Last 3 Encounters:  03/15/22 264 lb (119.7 kg)  01/07/22 255 lb (115.7 kg)  08/25/21 255 lb 3.2 oz (115.8 kg)  Has had weight gain due to inactivity and poor diet.  Encouraged him to work on diet/exercise/weight loss. Continue furosemide.       Aortic aneurysm [facility])    Due for follow up imaging 10/23.       Anxiety and depression    Stable/management per psych.       Anemia   Relevant Orders   CBC with Differential/Platelet   Iron     No orders of the defined types were placed in this encounter.   I, Nance Pear, NP, personally preformed the services described in this documentation.  All medical record entries made by the scribe were at my direction and in my presence.  I have reviewed the chart and discharge instructions (if applicable) and agree that the record reflects my personal performance and is accurate and complete. 03/15/2022   I,[name],acting as a Education administrator for Nance Pear, NP.,have documented all relevant documentation on the behalf of Nance Pear, NP,as directed by  Nance Pear, NP while in the presence of Nance Pear, NP.   Nance Pear, NP

## 2022-03-15 NOTE — Assessment & Plan Note (Addendum)
Due for follow up imaging 10/23.

## 2022-03-15 NOTE — Assessment & Plan Note (Signed)
BP Readings from Last 3 Encounters:  03/15/22 120/64  08/25/21 132/65  04/01/21 102/87   BP at goal on Lotrel, hctz toprol xl. Cotinue same.

## 2022-03-15 NOTE — Assessment & Plan Note (Addendum)
Wt Readings from Last 3 Encounters:  03/15/22 264 lb (119.7 kg)  01/07/22 255 lb (115.7 kg)  08/25/21 255 lb 3.2 oz (115.8 kg)   Has had weight gain due to inactivity and poor diet.  Encouraged him to work on diet/exercise/weight loss. Continue furosemide.

## 2022-03-15 NOTE — Assessment & Plan Note (Signed)
New.  Refer to dermatology. 

## 2022-03-15 NOTE — Assessment & Plan Note (Signed)
Lab Results  Component Value Date   HGBA1C 5.5 09/23/2020

## 2022-03-15 NOTE — Assessment & Plan Note (Signed)
Stable- management per psych.  

## 2022-03-18 DIAGNOSIS — L820 Inflamed seborrheic keratosis: Secondary | ICD-10-CM | POA: Diagnosis not present

## 2022-04-01 ENCOUNTER — Ambulatory Visit (INDEPENDENT_AMBULATORY_CARE_PROVIDER_SITE_OTHER): Payer: Medicare Other | Admitting: Family

## 2022-04-01 VITALS — BP 125/65 | HR 66 | Temp 98.4°F | Resp 16 | Wt 258.0 lb

## 2022-04-01 DIAGNOSIS — H1031 Unspecified acute conjunctivitis, right eye: Secondary | ICD-10-CM | POA: Diagnosis not present

## 2022-04-01 DIAGNOSIS — U071 COVID-19: Secondary | ICD-10-CM

## 2022-04-01 DIAGNOSIS — R0981 Nasal congestion: Secondary | ICD-10-CM

## 2022-04-01 LAB — POC COVID19 BINAXNOW: SARS Coronavirus 2 Ag: POSITIVE — AB

## 2022-04-01 MED ORDER — MOLNUPIRAVIR 200 MG PO CAPS: 4.0000 | ORAL_CAPSULE | Freq: Two times a day (BID) | ORAL | 0 refills | Status: AC

## 2022-04-01 MED ORDER — CIPROFLOXACIN HCL 0.3 % OP SOLN: 2.0000 [drp] | OPHTHALMIC | 0 refills | Status: DC

## 2022-04-01 NOTE — Progress Notes (Signed)
Subjective:     [name], male    DOB: November 26, 1955, 66 y.o.   MRN: [id number]  Chief Complaint  Patient presents with   Nasal Congestion    Complains of nasal congestion that started yesterday   Ear Problem    "Ears feel funny and keep popping"   Eye Burn    Complains of right eye redness, burning and pain.     HPI   Patient is in today with several concerns:  Nasal congestion- began yesterday.  Ear pressure- reports popping bilateral ears.  Right eye redness- since yesterday. No vision problem in that eye.   He has not tested for covid.  Denies known sick contacts. Denies fever, sore throat.  Mild cough. Denies body aches.    Health Maintenance Due  Topic Date Due   COVID-19 Vaccine (4 - Moderna series) 09/21/2021   INFLUENZA VACCINE  03/23/2022    Past Medical History:  Diagnosis Date   Anemia 11/16/2014   Anxiety    Aortic aneurysm (HCC)    4.1 cm 2019, ascending aorta   Bladder disease    "an unusual bladder disease; don't know what it's called" (06/30/2015)   Bulging lumbar disc    COVID-19 01/09/2021   Depression    GERD (gastroesophageal reflux disease)    History of hiatal hernia    Hyperglycemia 11/16/2014   Hyperlipemia    Hypertension    Joint pain, knee    Migraine    06/30/2015 "maybe 2-3 times/year; not as bad as I used to have them"   OSA (obstructive sleep apnea)     Past Surgical History:  Procedure Laterality Date   ESOPHAGOGASTRODUODENOSCOPY     ESOPHAGOGASTRODUODENOSCOPY (EGD) WITH ESOPHAGEAL DILATION  ~ 2014   JOINT REPLACEMENT     right knee   KNEE ARTHROSCOPY Right 08/23/1993   NASAL SEPTUM SURGERY  ~ 1972   TOTAL KNEE ARTHROPLASTY  10/11/2011   Procedure: TOTAL KNEE ARTHROPLASTY;  Surgeon: Rudean Haskell, MD;  Location: [place];  Service: Orthopedics;  Laterality: Right;   TRANSFORAMINAL LUMBAR INTERBODY FUSION W/ MIS 1 LEVEL Right 03/31/2021   Procedure: Right Lumbar five Sacral one Minimally invasive  transforaminal lumbar interbody fusion;  Surgeon: Judith Part, MD;  Location: [place];  Service: Neurosurgery;  Laterality: Right;   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)  ~2014    Family History  Problem Relation Age of Onset   Stroke Mother 78       Died of CVA   Hypertension Mother    Anesthesia problems Neg Hx    Hypotension Neg Hx    Malignant hyperthermia Neg Hx    Pseudochol deficiency Neg Hx     Social History   Socioeconomic History   Marital status: Married    Spouse name: Not on file   Number of children: 3   Years of education: Not on file   Highest education level: Not on file  Occupational History   Not on file  Tobacco Use   Smoking status: Former    Packs/day: 1.50    Years: 6.00    Total pack years: 9.00    Types: Cigarettes   Smokeless tobacco: Never   Tobacco comments:    "stopped smoking in ~ 1990"  Substance and Sexual Activity   Alcohol use: No    Alcohol/week: 0.0 standard drinks of alcohol   Drug use: No   Sexual activity: Yes  Other Topics Concern  Not on file  Social History Narrative   3 children    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, research (life sciences))   Married   Completed 12th grade   Enjoys softball   Social Determinants of Radio broadcast assistant Strain: Low Risk  (01/07/2022)   Overall Financial Resource Strain (CARDIA)    Difficulty of Paying Living Expenses: Not hard at all  Food Insecurity: No Food Insecurity (01/07/2022)   Hunger Vital Sign    Worried About Running Out of Food in the Last Year: Never true    [place] in the Last Year: Never true  Transportation Needs: No Transportation Needs (01/07/2022)   PRAPARE - Hydrologist (Medical): No    Lack of Transportation (Non-Medical): No  Physical Activity: Inactive (01/07/2022)   Exercise Vital Sign    Days of Exercise per Week: 0 days    Minutes of Exercise per Session: 0 min  Stress: No Stress  Concern Present (01/07/2022)   [place]    Feeling of Stress : Not at all  Social Connections: [place] (01/07/2022)   Social Connection and Isolation Panel [NHANES]    Frequency of Communication with Friends and Family: More than three times a week    Frequency of Social Gatherings with Friends and Family: More than three times a week    Attends Religious Services: More than 4 times per year    Active Member of [employer] or Organizations: Yes    Attends Music therapist: More than 4 times per year    Marital Status: Married  Human resources officer Violence: Not At Risk (01/07/2022)   Humiliation, Afraid, Rape, and Kick questionnaire    Fear of Current or Ex-Partner: No    Emotionally Abused: No    Physically Abused: No    Sexually Abused: No    Outpatient Medications Prior to Visit  Medication Sig Dispense Refill   amLODipine-benazepril (LOTREL) 10-40 MG capsule Take 1 capsule by mouth daily. 90 capsule 1   ARIPiprazole (ABILIFY) 5 MG tablet Take 1 tablet (5 mg total) by mouth daily. 90 tablet 0   ascorbic acid (VITAMIN C) 500 MG tablet Take by mouth.     b complex vitamins tablet Take 1 tablet by mouth daily.     ferrous sulfate 325 (65 FE) MG tablet Take 1 tablet (325 mg total) by mouth 2 (two) times daily with a meal. 60 tablet 0   fexofenadine (ALLEGRA) 180 MG tablet Take 1 tablet (180 mg total) by mouth daily. 30 tablet 0   furosemide (LASIX) 20 MG tablet Take 1 tablet by mouth daily 90 tablet 0   hydrochlorothiazide (HYDRODIURIL) 25 MG tablet TAKE 1 TABLET(25 MG) BY MOUTH DAILY 90 tablet 1   methocarbamol (ROBAXIN) 500 MG tablet Take by mouth.     metoprolol succinate (TOPROL-XL) 50 MG 24 hr tablet Take one tablet daily ('50MG'$ ) with or immediately following a meal. 90 tablet 1   Multiple Vitamin (MULTIVITAMIN WITH MINERALS) TABS tablet Take 1 tablet by mouth daily.     omeprazole (PRILOSEC) 20 MG  capsule Take 1 capsule (20 mg total) by mouth daily. 30 capsule 3   potassium chloride (KLOR-CON M) 10 MEQ tablet Take 2 tablets by mouth in the AM and 1 tablet by mouth in the PM 270 tablet 1   sertraline (ZOLOFT) 100 MG tablet Take 1.5 tablets (150 mg total) by mouth daily.  135 tablet 0   topiramate (TOPAMAX) 25 MG tablet Take 3 tablets (75 mg total) by mouth 2 (two) times daily. 180 tablet 1   No facility-administered medications prior to visit.    No Known Allergies  ROS    See HPI Objective:    Physical Exam Constitutional:      General: He is not in acute distress.    Appearance: He is well-developed.  HENT:     Head: Normocephalic and atraumatic.     Right Ear: Tympanic membrane and ear canal normal.     Left Ear: Tympanic membrane and ear canal normal.  Eyes:     Extraocular Movements: Extraocular movements intact.     Conjunctiva/sclera:     Right eye: Right conjunctiva is injected. No exudate.    Comments: Bilateral pupils are small/constricted  Cardiovascular:     Rate and Rhythm: Normal rate and regular rhythm.     Heart sounds: No murmur heard. Pulmonary:     Effort: Pulmonary effort is normal. No respiratory distress.     Breath sounds: Normal breath sounds. No wheezing or rales.  Skin:    General: Skin is warm and dry.  Neurological:     Mental Status: He is alert and oriented to person, place, and time.  Psychiatric:        Behavior: Behavior normal.        Thought Content: Thought content normal.     BP 125/65 (BP Location: Right Arm, Patient Position: Sitting, Cuff Size: Large)   Pulse 66   Temp 98.4 F (36.9 C) (Oral)   Resp 16   Wt 258 lb (117 kg)   SpO2 99%   BMI 41.64 kg/m  Wt Readings from Last 3 Encounters:  04/01/22 258 lb (117 kg)  03/15/22 264 lb (119.7 kg)  01/07/22 255 lb (115.7 kg)       Assessment & Plan:   Problem List Items Addressed This Visit       Unprioritized   COVID-19    + rapid covid test. Will rx with  molnupiravir. Discussed quarantine guidelines as well as supportive measures including:  For pain you may use tylenol, for cough you may use delsym.  Call if new/worsening symptoms or if symptoms are not improved in 3-4 days.       Relevant Medications   molnupiravir EUA (LAGEVRIO) 200 MG CAPS capsule   Acute conjunctivitis of right eye - Primary    New. Will rx with ciloxan drops.       Other Visit Diagnoses     Nasal congestion       Relevant Orders   POC COVID-19 (Completed)       I am having Jeremy [name] "Tim" start on ciprofloxacin and molnupiravir EUA. I am also having him maintain his b complex vitamins, multivitamin with minerals, ferrous sulfate, fexofenadine, omeprazole, sertraline, ascorbic acid, methocarbamol, hydrochlorothiazide, amLODipine-benazepril, metoprolol succinate, furosemide, topiramate, ARIPiprazole, and potassium chloride.  Meds ordered this encounter  Medications   ciprofloxacin (CILOXAN) 0.3 % ophthalmic solution    Sig: Place 2 drops into both eyes every 4 (four) hours while awake for 5 days. Administer 1 drop, every 2 hours, while awake, for 2 days. Then 1 drop, every 4 hours, while awake, for the next 5 days.    Dispense:  2.5 mL    Refill:  0    Order Specific Question:   Supervising Provider    Answer:   Penni Homans A [[id number]]   molnupiravir EUA (  LAGEVRIO) 200 MG CAPS capsule    Sig: Take 4 capsules (800 mg total) by mouth 2 (two) times daily for 5 days.    Dispense:  40 capsule    Refill:  0    Order Specific Question:   Supervising Provider    Answer:   Penni Homans A [[id number]]

## 2022-04-01 NOTE — Patient Instructions (Addendum)
Start eye drops for conjunctivitis.  Start molnupiravir twice daily for 5 days. Strict quarantine through 8/13, then 5 more days of masking. For pain you may use tylenol, for cough you may use delsym.  Call if new/worsening symptoms or if symptoms are not improved in 3-4 days.

## 2022-04-01 NOTE — Assessment & Plan Note (Addendum)
+   rapid covid test. Will rx with molnupiravir. Discussed quarantine guidelines as well as supportive measures including:  For pain you may use tylenol, for cough you may use delsym.  Call if new/worsening symptoms or if symptoms are not improved in 3-4 days.

## 2022-04-01 NOTE — Assessment & Plan Note (Signed)
New. Will rx with ciloxan drops.

## 2022-04-05 ENCOUNTER — Ambulatory Visit (INDEPENDENT_AMBULATORY_CARE_PROVIDER_SITE_OTHER): Payer: Medicare Other | Admitting: Family Medicine

## 2022-04-05 ENCOUNTER — Encounter: Payer: Self-pay | Admitting: Family Medicine

## 2022-04-05 VITALS — BP 118/71 | HR 66 | Temp 98.3°F | Ht 66.0 in | Wt 254.2 lb

## 2022-04-05 DIAGNOSIS — H04129 Dry eye syndrome of unspecified lacrimal gland: Secondary | ICD-10-CM

## 2022-04-05 DIAGNOSIS — H1031 Unspecified acute conjunctivitis, right eye: Secondary | ICD-10-CM

## 2022-04-05 MED ORDER — ERYTHROMYCIN 5 MG/GM OP OINT: 1.0000 | TOPICAL_OINTMENT | Freq: Every day | OPHTHALMIC | 0 refills | Status: AC

## 2022-04-05 NOTE — Patient Instructions (Signed)
Artificial tears like Refresh and Systane may be used for comfort. OK to get generic version. Generally people use them every 2-4 hours, but you can use them as much as you want because there is no medication in it.  Stop the Cipro drops.   Let us know if you need anything.

## 2022-04-05 NOTE — Progress Notes (Signed)
Chief Complaint  Patient presents with   Covid Positive    Tested positive on Thursday for Covid 04/01/22    Dry [place] is here for bilateral eye irritation.  Duration: 4 days Chemical exposure? No  Recent URI? Yes  Contact lenses? No  History of allergies? No  Treatment to date:  Started Cipro eye drops 4 d ago that he thinks dried his eyes out. Some crusty debris in both eyes.  No fevers, drainage, redness, eye pain, itching.   Past Medical History:  Diagnosis Date   Anemia 11/16/2014   Anxiety    Aortic aneurysm ([place])    4.1 cm 2019, ascending aorta   Bladder disease    "an unusual bladder disease; don't know what it's called" (06/30/2015)   Bulging lumbar disc    COVID-19 01/09/2021   Depression    GERD (gastroesophageal reflux disease)    History of hiatal hernia    Hyperglycemia 11/16/2014   Hyperlipemia    Hypertension    Joint pain, knee    Migraine    06/30/2015 "maybe 2-3 times/year; not as bad as I used to have them"   OSA (obstructive sleep apnea)    Family History  Problem Relation Age of Onset   Stroke Mother 40       Died of CVA   Hypertension Mother    Anesthesia problems Neg Hx    Hypotension Neg Hx    Malignant hyperthermia Neg Hx    Pseudochol deficiency Neg Hx     BP 118/71   Pulse 66   Temp 98.3 F (36.8 C) (Oral)   Ht '5\' 6"'$  (1.676 m)   Wt 254 lb 4 oz (115.3 kg)   SpO2 96%   BMI 41.04 kg/m  Gen: Awake, alert, appears stated age Eyes: Lids neg, Sclera mildly injected b/l, no drainage, no ttp over globes, PERRLA, EOMi Psych: Age appropriate judgment and insight; mood and affect normal  Dry eye  Acute conjunctivitis of right eye, unspecified acute conjunctivitis type  Artificial tears recommended. Exchange Cipro drops with Erythromycin ointment.  F/u prn.  Pt voiced understanding and agreement to the plan.  [place], DO 04/05/22 4:04 PM

## 2022-04-07 ENCOUNTER — Other Ambulatory Visit: Payer: Self-pay

## 2022-04-07 ENCOUNTER — Telehealth: Payer: Self-pay | Admitting: Psychiatry

## 2022-04-07 DIAGNOSIS — R635 Abnormal weight gain: Secondary | ICD-10-CM

## 2022-04-07 NOTE — Telephone Encounter (Signed)
Pt called requesting RF for Topiramate. Increased to 75 mg 2/d at 7/19 apt and is making Rx short. Requesting 90 day supply [facility] [address] [facility]. Contact pt if questions [phone]  Apt 10/19

## 2022-04-07 NOTE — Telephone Encounter (Signed)
LVM rx already at pharmacy

## 2022-04-14 ENCOUNTER — Ambulatory Visit: Payer: Self-pay | Admitting: *Deleted

## 2022-04-14 ENCOUNTER — Encounter: Payer: Self-pay | Admitting: *Deleted

## 2022-04-14 NOTE — Patient Outreach (Signed)
  Care Coordination   Initial Visit Note   04/14/2022 Name: Jeremy [name] MRN: [id number] DOB: 1956-05-04  Jeremy [name] is a 66 y.o. year old male who sees [name], NP for primary care. I spoke with  Ross Marcus by phone today  What matters to the patients health and wellness today?  "I am doing so much better after having had COVID earlier this month; my eyes have completely healed up after they gave me that second medication; I am not having any problems at all; I even went out today and got my flu vaccine and my second shingles vaccine; really, I am doing great"    Goals Addressed             This Visit's Progress    COMPLETED: Care Coordination Activities: No follow up required   On track    Care Coordination Interventions: Evaluation of current treatment plan related to HTN and patient's adherence to plan as established by provider Advised patient to provide appropriate vaccination information to provider or CM team member at next visit Advised patient to consider checking blood pressures at home once a week and write down on paper for review at provider office visits; continue efforts at following low salt diet; last recalled blood pressure at home "138/84;" currently does not write down on paper Reviewed medications with patient and discussed side effects of blood pressure medications- confirmed patient not experiencing side effects; patient endorses adherence to taking medications as prescribed Reviewed scheduled/upcoming provider appointments including 09/15/22- PCP Assessed social determinant of health barriers Confirmed patient had Medicare Annual Wellness Visit 01/07/22; confirmed he obtained flu vaccine today and second shingles vaccine today at outpatient pharmacy         SDOH assessments and interventions completed:  Yes  SDOH Interventions Today    Flowsheet Row Most Recent Value  SDOH Interventions   Food Insecurity Interventions Intervention  Not Indicated  Housing Interventions Intervention Not Indicated  Transportation Interventions Intervention Not Indicated  [patient drives self]       Care Coordination Interventions Activated:  Yes  Care Coordination Interventions:  Yes, provided   Follow up plan: No further intervention required.   Encounter Outcome:  Pt. Visit Completed   Oneta Rack, RN, BSN, [place] RN [facility] [phone]: direct office

## 2022-04-14 NOTE — Patient Instructions (Signed)
Visit Information  Thank you for taking time to visit with me today. Please don't hesitate to contact me if I can be of assistance to you.   Following are the goals we discussed today:   Goals Addressed             This Visit's Progress    COMPLETED: Care Coordination Activities: No follow up required   On track    Care Coordination Interventions: Evaluation of current treatment plan related to HTN and patient's adherence to plan as established by provider Advised patient to provide appropriate vaccination information to provider or CM team member at next visit Advised patient to consider checking blood pressures at home once a week and write down on paper for review at provider office visits; continue efforts at following low salt diet; last recalled blood pressure at home "138/84;" currently does not write down on paper Reviewed medications with patient and discussed side effects of blood pressure medications- confirmed patient not experiencing side effects; patient endorses adherence to taking medications as prescribed Reviewed scheduled/upcoming provider appointments including 09/15/22- PCP Assessed social determinant of health barriers Confirmed patient had Medicare Annual Wellness Visit 01/07/22; confirmed he obtained flu vaccine today and second shingles vaccine today at outpatient pharmacy         If you are experiencing a Mental Health or [place] or need someone to talk to, please  call the Suicide and Crisis Lifeline: 988 call the [place] National Suicide Prevention Lifeline: 971 368 6447 or TTY: (838)612-0441 TTY 575-788-3352) to talk to a trained counselor call 1-800-273-TALK (toll free, 24 hour hotline) go to [facility] [address] [place] [phone]) call the [place]: [phone] call 911   Patient verbalizes understanding of instructions and care plan provided today and agrees to view  in [place]. Active MyChart status and patient understanding of how to access instructions and care plan via MyChart confirmed with patient.     No further follow up required: declines ongoing care coordination needs/ need for further outreach  Oneta Rack, RN, BSN, [facility] [phone]: direct office

## 2022-04-27 ENCOUNTER — Other Ambulatory Visit: Payer: Self-pay | Admitting: Psychiatry

## 2022-04-27 DIAGNOSIS — R635 Abnormal weight gain: Secondary | ICD-10-CM

## 2022-04-27 NOTE — Telephone Encounter (Signed)
Patient lvm at 10:54 requesting a refill for Topiramate '25mg'$ . States that he was supposed to receive a 90 day supply but only able to get a 30 day supply. Please rtc if needed [phone]

## 2022-04-27 NOTE — Telephone Encounter (Signed)
Should I send a 90 day rx? That would be #[phone]

## 2022-05-24 ENCOUNTER — Telehealth: Payer: Self-pay | Admitting: Family

## 2022-05-24 DIAGNOSIS — I10 Essential (primary) hypertension: Secondary | ICD-10-CM

## 2022-05-24 DIAGNOSIS — I712 Thoracic aortic aneurysm, without rupture, unspecified: Secondary | ICD-10-CM

## 2022-05-24 NOTE — Telephone Encounter (Signed)
Please advise pt that he is due for a follow up CT scan to evaluate his aorta.  He will need to complete bmet prior to scan.  Please schedule lab visit.

## 2022-05-25 NOTE — Telephone Encounter (Signed)
Pt aware- scheduled.

## 2022-05-27 ENCOUNTER — Other Ambulatory Visit (INDEPENDENT_AMBULATORY_CARE_PROVIDER_SITE_OTHER): Payer: Medicare Other

## 2022-05-27 DIAGNOSIS — I10 Essential (primary) hypertension: Secondary | ICD-10-CM

## 2022-05-27 LAB — BASIC METABOLIC PANEL
BUN: 17 mg/dL (ref 6–23)
CO2: 29 mEq/L (ref 19–32)
Calcium: 8.9 mg/dL (ref 8.4–10.5)
Chloride: 103 mEq/L (ref 96–112)
Creatinine, Ser: 1.11 mg/dL (ref 0.40–1.50)
GFR: 68.72 mL/min (ref >60.00)
Glucose, Bld: 89 mg/dL (ref 70–99)
Potassium: 3.7 mEq/L (ref 3.5–5.1)
Sodium: 139 mEq/L (ref 135–145)

## 2022-05-28 ENCOUNTER — Other Ambulatory Visit: Payer: Self-pay | Admitting: Family

## 2022-05-31 ENCOUNTER — Ambulatory Visit (HOSPITAL_BASED_OUTPATIENT_CLINIC_OR_DEPARTMENT_OTHER)
Admission: RE | Admit: 2022-05-31 | Discharge: 2022-05-31 | Disposition: A | Discharge status: Home / Self Care | Payer: Medicare Other | Source: Ambulatory Visit | Attending: Family | Admitting: Family

## 2022-05-31 ENCOUNTER — Ambulatory Visit: Payer: Medicare Other

## 2022-05-31 DIAGNOSIS — I712 Thoracic aortic aneurysm, without rupture, unspecified: Secondary | ICD-10-CM | POA: Diagnosis not present

## 2022-05-31 DIAGNOSIS — I7121 Aneurysm of the ascending aorta, without rupture: Secondary | ICD-10-CM | POA: Diagnosis not present

## 2022-05-31 MED ORDER — IOHEXOL 350 MG/ML SOLN
80.0000 mL | Freq: Once | INTRAVENOUS | Status: AC | PRN
  Administered 2022-05-31: 80 mL via INTRAVENOUS

## 2022-06-03 ENCOUNTER — Other Ambulatory Visit: Payer: Self-pay

## 2022-06-03 ENCOUNTER — Telehealth: Payer: Self-pay | Admitting: Psychiatry

## 2022-06-03 DIAGNOSIS — F333 Major depressive disorder, recurrent, severe with psychotic symptoms: Secondary | ICD-10-CM

## 2022-06-03 MED ORDER — ARIPIPRAZOLE 5 MG PO TABS: 5.0000 mg | ORAL_TABLET | Freq: Every day | ORAL | 0 refills | Status: DC

## 2022-06-03 NOTE — Telephone Encounter (Signed)
Rx sent 

## 2022-06-03 NOTE — Telephone Encounter (Signed)
Pt Lvm @ 9:38a.  He would like refill of Aripiprazole sent in.     [facility] [address]  [address], [place] [address]  Phone:  [phone]  Fax:  [phone]  Next appt 10/19

## 2022-06-10 ENCOUNTER — Encounter: Payer: Self-pay | Admitting: Psychiatry

## 2022-06-10 ENCOUNTER — Ambulatory Visit: Payer: Medicare Other | Admitting: Psychiatry

## 2022-06-10 DIAGNOSIS — R635 Abnormal weight gain: Secondary | ICD-10-CM | POA: Diagnosis not present

## 2022-06-10 DIAGNOSIS — F4001 Agoraphobia with panic disorder: Secondary | ICD-10-CM

## 2022-06-10 DIAGNOSIS — F411 Generalized anxiety disorder: Secondary | ICD-10-CM | POA: Diagnosis not present

## 2022-06-10 DIAGNOSIS — R632 Polyphagia: Secondary | ICD-10-CM

## 2022-06-10 DIAGNOSIS — F3340 Major depressive disorder, recurrent, in remission, unspecified: Secondary | ICD-10-CM

## 2022-06-10 DIAGNOSIS — F333 Major depressive disorder, recurrent, severe with psychotic symptoms: Secondary | ICD-10-CM

## 2022-06-10 MED ORDER — TOPIRAMATE 50 MG PO TABS: 150.0000 mg | ORAL_TABLET | Freq: Two times a day (BID) | ORAL | 0 refills | Status: DC

## 2022-06-10 MED ORDER — ARIPIPRAZOLE 5 MG PO TABS: 5.0000 mg | ORAL_TABLET | Freq: Every day | ORAL | 1 refills | Status: DC

## 2022-06-10 MED ORDER — SERTRALINE HCL 100 MG PO TABS: 150.0000 mg | ORAL_TABLET | Freq: Every day | ORAL | 1 refills | Status: DC

## 2022-06-10 NOTE — Progress Notes (Signed)
Jeremy [name] [id number] May 23, 1956 66 y.o.     Subjective:   Patient ID:  Jeremy [name] is a 66 y.o. (DOB December 01, 1955) male.  Chief Complaint:  Chief Complaint  Patient presents with   Follow-up   Depression   Anxiety    Depression        Associated symptoms include no decreased concentration and no suicidal ideas.  Past medical history includes anxiety.   Anxiety Patient reports no chest pain, confusion, decreased concentration, nervous/anxious behavior, palpitations or suicidal ideas.      Jeremy [name] presents  today for follow-up of anxiety and depression.   He had been out on disability due to his back since September at that time.  seen April 2020.  Since retirement he has been under less stress and had asked to reduce and hopefully eliminate some medications.  We reduced Abilify from 7.5 to 5 mg daily.  As of December 04, 2019 the following is noted: Still good. nOTHing worse with dose reduction.   Benefit from meds. Help depression, anxiety and anger.  They are all under control. No mood swings nor prolonged depression.  Anxiety OK.  Wants to try to reduce meds again if possible bc less stress as noted. Plan because of sexual side effects with sertraline he prefers to reduce sertraline to 50 mg daily and continue Abilify 5 mg daily.  02/21/2020 appointment with the following noted: Doing well still after reducing sertraline to 50 mg daily but no improvement in sexual function.  depression and anxiety  Are still under control. No panic.   Plan:  We discussed the options of trying to eliminate gradually the Abilify versus reducing the sertraline which is probably more risky.  However he is having sexual side effects from sertraline and no side effects from Abilify.  He prefers to stop the sertraline and see if he can get by without it. Therefore reduce sertraline to 25 mg daily for 2 weeks then stop it  05/07/20 appt with the following noted: Off sertraline without more  depression or anxiety.  No panic.  No withdrawal effects. Sexual function is not better.  Erection problems. Tried Viagra without help.  Saw urologist and had shots which helped then stopped helping. He wants to try stopping Abilify to see if sexual function will return. Plan: Since retiring his stress level has reduced significantly.  His depression and anxiety are under control.  He wants to try to reduce medications again.   He wants to try DC Abilify to see if sexual function is better despite risk relapse. He's been fine so far off sertraline.    06/02/2020 phone call from patient:Pt called and said that he had a panic attack yesterday. He wants to know if he can go back on his medications MD response: Fairly recently discontinued sertraline 50 mg daily.  That is the best antipanic med.  Have him restart sertraline 25 mg daily for 7 days and then 50 mg daily.  Will take several weeks to help.  Hold off the Abilify bc that was for depression. 06/13/20 TC: Pt LM on VM needing apt today to see CC. Returned call, Pt stated he is still having issues with anxiety and needs to see CC today. His follow up apt is 12/13.   2 panic attacks but close today.  06/16/2020 urgent appointment with the following noted: Seen with wife Panic and wanted to restart meds.  Wife says he's more depressed.  Only sleeps and eats and  is like a walking zombie without meds. Lost father and stepmother and friend.  She thinks he's depressed.  B committed suicide years ago.   Now he thinks I'm going to leave him. Family notices his depression.  Having to help adopted father with cancer. He agrees with wife. Tense and SOB with anxiety. Wife thinks he represses things.  Mo left him in an orphanage and has abandonment issues even with her.  Thinking she will leave. Plan: Relapse worse off meds  Increase sertraline to 100 mg daily.  May need to go higher Increase Abilify to 10 mg daily for severe depression with paranoid  rumination. OK prn Xanax prn panic   08/04/20 appt with following noted:  Seen with wife, Jeremy [name] Better with tension.  Feels less down too.   SE increased appetite.  But had recently lost 20 # and lately snacking more. Wife sees dramatic turnaround and he's more talkative and engaged and friends notice too.  It's working. No Xanax used.  Staying awake during day.   Not quite back to normal but really improved per wife.   Plan: If he is back to baseline at follow-up we will consider reducing the Abilify somewhat in order to reduce the weight gain potential if possible.  10/07/2020 appointment with following noted: Still sleeping too much and eating a lot.  Otherwise is OK.  Doesn't feel depressed.  Interest is a little better.  Not anxious.    Not aware of SE otherwise.  Not worrying too much.  Has retired.  Has reduced his stress significantly. Using CPAP. No panic. Plan: Continue sertraline 100 mg daily Reduce Abilify to 5 mg daily for the history of severe paranoid rumination and depression which are improved, but apparently it's sedating him.  12/11/2020 appointment with the following noted: Reduced Abilify to 5 mg daily without much change.   Mood been alright withuot depression.  Anxiety is not a problem. Sleep 9 hours at night and then naps in the morning after breakfast and then in afternoon.  Not much to do.  Not much he wants to do in retirement except golf and physical limitations hurt that. No other hobbies.     Weight stable.  He stopped the Abilify couple of weeks ago bc insurance told him they would not cover it and it would be $800 dollars. Plan:  Continue sertraline 100 mg daily He stopped Abilify 5 mg 2 weeks ago DT cost Disc risk relapse.  Call if relapse bc at risk for a couple of mos.  03/24/2021 appointment with the following noted:  Seen with daughter, Jeremy [name] Patient stopped Abilify 2 weeks prior to last appointment due to being told it would be $800.  He wanted to stay  off the medication and was doing okay at the time of appointment. Taking sertraline 150 mg daily. Xanax makes him too sleepy. Alright.  A little bit of anxiety.  Worrying about things with wife's problems with her brother's death.   Jeremy [name] notices his anxiety is out of control.  He's constantly hovering her and obsessing she may leave him again as in the past.  Definitely not himself and family notices not interacting with people.  She's noticed changes worsening over the year. B in law died 01/13/2023 making things worse. Sleep is interrupted.  He recognizes he's more fearful than normal. Is sad also. Pending back surgery 03/31/2021 Plan: Because he relapsed again off of Abilify we will do the following: Continue sertraline 150 mg daily Restart  Abilify 10 mg daily bc did best on it.  06/08/21 appt noted: Better with Abilify and SE hungry with weight gain of ? Amount. Rubs teeth front together at rest. No depression and anxiety now.  Patient reports stable mood and denies depressed or irritable moods.  Patient denies any recent difficulty with anxiety.  Patient denies difficulty with sleep initiation or maintenance. Denies appetite disturbance.  Patient reports that energy and motivation have been good.  Patient denies any difficulty with concentration.  Patient denies any suicidal ideation. Back pain not gone but better after surgery He remains on  Continue sertraline 150 mg daily and Abilify 10 Plan: Continue sertraline 150 mg daily Continue Abilify but we will reduce to 5 mg bc hunger and rubbing teeth together may be SE More paranoid off Abilify.  09/08/2021 appointment noted: Been doing good since here after reduction to Abilify 5 mg daily.  Thinks Abilify SE hunger and weight gain. Rubbing teeth together stopped with reduction in dose. Relapsed  Continues sertraline 150 mg daily. Plan: Continue sertraline 150 mg daily Continue Abilify 5 mg  bc hunger and rubbing teeth together may be  SE More paranoid off Abilify.  12/08/2021 appointment with the following noted: Back surgery August and it still bothers him to walk.  No exercise.  No weight loss. Good with depression.  F in law died last night with complications of CA and hip fx.  He was good man.   Anxiety also been ok without fear or paranoia. SE occ diarrhea and nothing else. Sleep is good.   Wife Jeremy [name] with problems psych dealing with brother's death and F's cancer. Plan: Increase metoformin to 1000 mg BID and if no benefit will stop it.  No benefit at 500 mg BID Continue sertraline 150 mg daily. Continue Abilify 5 mg daily because he was paranoid off of it  03/10/2022 appointment with the following noted: Had too much nausea on the higher dose of metformin and stopped it Wanted to try topiramate to see if it helped with his hunger problems and started 25 mg twice daily to then increase to 50 mg twice daily With topiramate lost a couple of # but seemed it quit working.  No SE with it. Mood been alright I guess.  Not depressed and no panic. No fear.  Sleep is good. Wants to lose mor eweight if possible. Plan: Increase topiramate to 75 mg BID for wt control and if NR after a couple of weeks then can increase to 100 mg BID  06/10/22 appt noted: Not losing wt with topiramate but no scales at home.   No exercise bc of back pain. Some reduction in appetite.   SE mild diarrhea but less than metformin. Current psych meds: topiramate 100 mg BID, sertraline 150 mg,  Abilify 5 mg daily Depression is pretty good.  No panic.  Anxiety level is OK. No other med problems except back and no history kidney stones. Cut out bread and potatoes.  Past history of psychiatric disability. Not sure how many episodes of depression he's had in life.  Historically about the same levels of depression and anxiety   Past Psychiatric Medication Trials: Sertraline 100 Abilify 10 helped SE weight gain Xanax 0.5 Metformin N Topiramate for  wt  Review of Systems:  Review of Systems  Constitutional:  Positive for unexpected weight change.  Cardiovascular:  Negative for chest pain and palpitations.  Genitourinary:        ED persistent  Musculoskeletal:  Positive for  arthralgias and back pain.  Neurological:  Negative for tremors.  Psychiatric/Behavioral:  Negative for agitation, behavioral problems, confusion, decreased concentration, dysphoric mood, hallucinations, self-injury, sleep disturbance and suicidal ideas. The patient is not nervous/anxious and is not hyperactive.     Medications: I have reviewed the patient's current medications.  Current Outpatient Medications  Medication Sig Dispense Refill   amLODipine-benazepril (LOTREL) 10-40 MG capsule Take 1 capsule by mouth daily. 90 capsule 1   ARIPiprazole (ABILIFY) 5 MG tablet Take 1 tablet (5 mg total) by mouth daily. 90 tablet 0   ascorbic acid (VITAMIN C) 500 MG tablet Take by mouth.     b complex vitamins tablet Take 1 tablet by mouth daily.     ferrous sulfate 325 (65 FE) MG tablet Take 1 tablet (325 mg total) by mouth 2 (two) times daily with a meal. 60 tablet 0   fexofenadine (ALLEGRA) 180 MG tablet Take 1 tablet (180 mg total) by mouth daily. 30 tablet 0   furosemide (LASIX) 20 MG tablet Take 1 tablet by mouth once daily 90 tablet 0   hydrochlorothiazide (HYDRODIURIL) 25 MG tablet TAKE 1 TABLET(25 MG) BY MOUTH DAILY 90 tablet 1   methocarbamol (ROBAXIN) 500 MG tablet Take by mouth.     metoprolol succinate (TOPROL-XL) 50 MG 24 hr tablet Take one tablet daily ('50MG'$ ) with or immediately following a meal. 90 tablet 1   Multiple Vitamin (MULTIVITAMIN WITH MINERALS) TABS tablet Take 1 tablet by mouth daily.     omeprazole (PRILOSEC) 20 MG capsule Take 1 capsule (20 mg total) by mouth daily. 30 capsule 3   potassium chloride (KLOR-CON M) 10 MEQ tablet Take 2 tablets by mouth in the AM and 1 tablet by mouth in the PM 270 tablet 1   sertraline (ZOLOFT) 100 MG tablet  Take 1.5 tablets (150 mg total) by mouth daily. 135 tablet 0   topiramate (TOPAMAX) 25 MG tablet TAKE 3 TABLETS BY MOUTH TWICE DAILY (Patient taking differently: Take 75 mg by mouth 2 (two) times daily. 4 tabs BID) 540 tablet 0   No current facility-administered medications for this visit.    Medication Side Effects: ED with Zoloft.  Viagra failed.  Using shots.  Allergies: No Known Allergies  Past Medical History:  Diagnosis Date   Anemia 11/16/2014   Anxiety    Aortic aneurysm (HCC)    4.1 cm 2019, ascending aorta   Bladder disease    "an unusual bladder disease; don't know what it's called" (06/30/2015)   Bulging lumbar disc    COVID-19 01/09/2021   Depression    GERD (gastroesophageal reflux disease)    History of hiatal hernia    Hyperglycemia 11/16/2014   Hyperlipemia    Hypertension    Joint pain, knee    Migraine    06/30/2015 "maybe 2-3 times/year; not as bad as I used to have them"   OSA (obstructive sleep apnea)     Family History  Problem Relation Age of Onset   Stroke Mother 33       Died of CVA   Hypertension Mother    Anesthesia problems Neg Hx    Hypotension Neg Hx    Malignant hyperthermia Neg Hx    Pseudochol deficiency Neg Hx     Social History   Socioeconomic History   Marital status: Married    Spouse name: Not on file   Number of children: 3   Years of education: Not on file   Highest education level: Not  on file  Occupational History   Not on file  Tobacco Use   Smoking status: Former    Packs/day: 1.50    Years: 6.00    Total pack years: 9.00    Types: Cigarettes   Smokeless tobacco: Never   Tobacco comments:    "stopped smoking in ~ 1990"  Substance and Sexual Activity   Alcohol use: No    Alcohol/week: 0.0 standard drinks of alcohol   Drug use: No   Sexual activity: Yes  Other Topics Concern   Not on file  Social History Narrative   3 children    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, research (life sciences))   Married   Completed  12th grade   Enjoys softball   Social Determinants of Radio broadcast assistant Strain: Low Risk  (01/07/2022)   Overall Financial Resource Strain (CARDIA)    Difficulty of Paying Living Expenses: Not hard at all  Food Insecurity: No Food Insecurity (04/14/2022)   Hunger Vital Sign    Worried About Running Out of Food in the Last Year: Never true    [place] in the Last Year: Never true  Transportation Needs: No Transportation Needs (04/14/2022)   PRAPARE - Hydrologist (Medical): No    Lack of Transportation (Non-Medical): No  Physical Activity: Inactive (01/07/2022)   Exercise Vital Sign    Days of Exercise per Week: 0 days    Minutes of Exercise per Session: 0 min  Stress: No Stress Concern Present (01/07/2022)   [place]    Feeling of Stress : Not at all  Social Connections: [place] (01/07/2022)   Social Connection and Isolation Panel [NHANES]    Frequency of Communication with Friends and Family: More than three times a week    Frequency of Social Gatherings with Friends and Family: More than three times a week    Attends Religious Services: More than 4 times per year    Active Member of [employer] or Organizations: Yes    Attends Archivist Meetings: More than 4 times per year    Marital Status: Married  Human resources officer Violence: Not At Risk (01/07/2022)   Humiliation, Afraid, Rape, and Kick questionnaire    Fear of Current or Ex-Partner: No    Emotionally Abused: No    Physically Abused: No    Sexually Abused: No    Past Medical History, Surgical history, Social history, and Family history were reviewed and updated as appropriate.   Please see review of systems for further details on the patient's review from today.   Objective:   Physical Exam:  There were no vitals taken for this visit.  Physical Exam Constitutional:      General: He is not in  acute distress.    Appearance: He is well-developed.  Musculoskeletal:        General: No deformity.  Neurological:     Mental Status: He is alert and oriented to person, place, and time.     Motor: No tremor.     Coordination: Coordination normal.     Gait: Gait normal.  Psychiatric:        Attention and Perception: Attention and perception normal.        Mood and Affect: Mood is not anxious or depressed. Affect is not labile, blunt or flat.        Speech: Speech normal.  Behavior: Behavior is not slowed.        Thought Content: Thought content is not paranoid or delusional. Thought content does not include homicidal or suicidal ideation. Thought content does not include suicidal plan.        Cognition and Memory: Cognition normal.        Judgment: Judgment normal.     Comments: Insight intact. No auditory or visual hallucinations.     Lab Review:     Component Value Date/Time   NA 139 05/27/2022 0820   K 3.7 05/27/2022 0820   CL 103 05/27/2022 0820   CO2 29 05/27/2022 0820   GLUCOSE 89 05/27/2022 0820   BUN 17 05/27/2022 0820   CREATININE 1.11 05/27/2022 0820   CREATININE 1.05 01/13/2018 1542   CALCIUM 8.9 05/27/2022 0820   PROT 6.4 09/23/2020 0949   ALBUMIN 4.1 09/23/2020 0949   AST 21 09/23/2020 0949   ALT 29 09/23/2020 0949   ALKPHOS 75 09/23/2020 0949   BILITOT 0.4 09/23/2020 0949   GFRNONAA >60 03/26/2021 1259   GFRAA >60 10/25/2017 0438       Component Value Date/Time   WBC 7.4 03/15/2022 1025   RBC 4.30 03/15/2022 1025   HGB 13.4 03/15/2022 1025   HCT 39.7 03/15/2022 1025   PLT 267.0 03/15/2022 1025   MCV 92.2 03/15/2022 1025   MCH 31.6 03/26/2021 1259   MCHC 33.7 03/15/2022 1025   RDW 13.7 03/15/2022 1025   LYMPHSABS 2.1 03/15/2022 1025   MONOABS 0.6 03/15/2022 1025   EOSABS 0.2 03/15/2022 1025   BASOSABS 0.1 03/15/2022 1025    No results found for: "POCLITH", "LITHIUM"   No results found for: "PHENYTOIN", "PHENOBARB", "VALPROATE", "CBMZ"    .res Assessment: Plan:    No diagnosis found. Erectile 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 We discussed [name] has a history of severe depression and panic disorder and anxiety that caused him to go out on disability in 2019. He relapsed after retirement off meds. Relapsed again off Abilify in August 2022 and resolved on 10 mg again .  Ok now on 5 mg daily However he had weight gain and possibly some EPS. Relapse worse off meds. He's much too inactive in retirement.  Emphasized need to be more active in retirment otherwise health will decline including mental health.  Wife busy in wildlife rehab.   Disc getting b ack into therapy also  Continue sertraline 150 mg daily Continue Abilify 5 mg  bc hunger and rubbing teeth together may be SE More paranoid off Abilify. None current.  Disc risk. Disc Good RX  Increase topiramate to 150 mg BID for wt control bc needs to lose wt DT back pain Disc Se risk and risk kidney stones  Discussed potential metabolic side effects associated with atypical antipsychotics, as well as potential risk for movement side effects. Advised pt to contact office if movement side effects occur.  Disc diet and exercise.    Follow-up 3-4 mos  Lynder Parents, MD, DFAPA  Please see After Visit Summary for patient specific instructions.  Future Appointments  Date Time Provider [place]  09/15/2022 10:00 AM [name], NP [facility]  01/11/2023  3:00 PM [facility]ALTH COACH [facility]    No orders of the defined types were placed in this encounter.      -------------------------------

## 2022-06-30 ENCOUNTER — Telehealth: Payer: Self-pay | Admitting: Family

## 2022-06-30 ENCOUNTER — Other Ambulatory Visit: Payer: Self-pay

## 2022-06-30 MED ORDER — HYDROCHLOROTHIAZIDE 25 MG PO TABS: ORAL_TABLET | ORAL | 1 refills | Status: DC

## 2022-06-30 MED ORDER — METOPROLOL SUCCINATE ER 50 MG PO TB24: ORAL_TABLET | ORAL | 1 refills | Status: DC

## 2022-06-30 MED ORDER — AMLODIPINE BESY-BENAZEPRIL HCL 10-40 MG PO CAPS: 1.0000 | ORAL_CAPSULE | Freq: Every day | ORAL | 1 refills | Status: DC

## 2022-06-30 NOTE — Telephone Encounter (Signed)
Refills sent

## 2022-06-30 NOTE — Telephone Encounter (Signed)
Medication: hydrochlorothiazide (HYDRODIURIL) 25 MG tablet   amLODipine-benazepril (LOTREL) 10-40 MG capsule   metoprolol succinate (TOPROL-XL) 50 MG 24 hr tablet    Has the patient contacted their pharmacy? No.    Preferred Pharmacy (with phone number or street name):  [facility], [place] [address] Phone: [phone]  Fax: [phone]    Agent: Please be advised that RX refills may take up to 3 business days. We ask that you follow-up with your pharmacy.

## 2022-07-19 ENCOUNTER — Telehealth: Payer: Self-pay

## 2022-07-19 NOTE — Telephone Encounter (Signed)
Patient advised I've been trying to reach him to schedule his follow up appointment with provider. He was scheduled to come in 07/26/2022

## 2022-07-19 NOTE — Telephone Encounter (Signed)
Caller Name Perry Phone Number [phone] Patient Name Jeremy [name] Patient DOB 1956/07/23 Call Type Message Only Information Provided Reason for Call Request for General Office Information Initial Comment Caller states they would like to speak to the provider for a referral to Dr. Patrice Paradise. Additional Comment Office hours provided Disp. Time Disposition Final User 07/16/2022 8:56:23 AM General Information Provided Yes Achilles Dunk Call Closed By: Achilles Dunk Transaction Date/Time: 07/16/2022 8:53:52 AM (ET)

## 2022-07-21 NOTE — Telephone Encounter (Signed)
Patient returned cma's call. Patient is scheduled to see Earlie Counts 07/26/2022 and plans to discuss referral with her at that time. If something else is needed from patient,please call back.

## 2022-07-26 ENCOUNTER — Encounter: Payer: Self-pay | Admitting: Family

## 2022-07-26 ENCOUNTER — Ambulatory Visit (INDEPENDENT_AMBULATORY_CARE_PROVIDER_SITE_OTHER): Payer: Medicare Other | Admitting: Family

## 2022-07-26 ENCOUNTER — Other Ambulatory Visit (HOSPITAL_BASED_OUTPATIENT_CLINIC_OR_DEPARTMENT_OTHER): Payer: Self-pay

## 2022-07-26 VITALS — BP 116/63 | HR 62 | Temp 98.0°F | Resp 16 | Wt 254.0 lb

## 2022-07-26 DIAGNOSIS — I10 Essential (primary) hypertension: Secondary | ICD-10-CM

## 2022-07-26 DIAGNOSIS — E781 Pure hyperglyceridemia: Secondary | ICD-10-CM

## 2022-07-26 DIAGNOSIS — E785 Hyperlipidemia, unspecified: Secondary | ICD-10-CM

## 2022-07-26 DIAGNOSIS — K219 Gastro-esophageal reflux disease without esophagitis: Secondary | ICD-10-CM

## 2022-07-26 DIAGNOSIS — I719 Aortic aneurysm of unspecified site, without rupture: Secondary | ICD-10-CM

## 2022-07-26 DIAGNOSIS — G4733 Obstructive sleep apnea (adult) (pediatric): Secondary | ICD-10-CM

## 2022-07-26 DIAGNOSIS — M545 Low back pain, unspecified: Secondary | ICD-10-CM

## 2022-07-26 DIAGNOSIS — F32A Depression, unspecified: Secondary | ICD-10-CM

## 2022-07-26 DIAGNOSIS — F419 Anxiety disorder, unspecified: Secondary | ICD-10-CM

## 2022-07-26 DIAGNOSIS — I5032 Chronic diastolic (congestive) heart failure: Secondary | ICD-10-CM | POA: Diagnosis not present

## 2022-07-26 DIAGNOSIS — R739 Hyperglycemia, unspecified: Secondary | ICD-10-CM | POA: Diagnosis not present

## 2022-07-26 DIAGNOSIS — D649 Anemia, unspecified: Secondary | ICD-10-CM

## 2022-07-26 DIAGNOSIS — G8929 Other chronic pain: Secondary | ICD-10-CM

## 2022-07-26 LAB — LIPID PANEL
Cholesterol: 186 mg/dL (ref 0–200)
HDL: 34.4 mg/dL — ABNORMAL LOW (ref >39.00)
LDL Cholesterol: 112 mg/dL — ABNORMAL HIGH (ref 0–99)
NonHDL: 151.38
Total CHOL/HDL Ratio: 5
Triglycerides: 195 mg/dL — ABNORMAL HIGH (ref 0.0–149.0)
VLDL: 39 mg/dL (ref 0.0–40.0)

## 2022-07-26 LAB — HEMOGLOBIN A1C: Hgb A1c MFr Bld: 5.7 % (ref 4.6–6.5)

## 2022-07-26 MED ORDER — COMIRNATY 30 MCG/0.3ML IM SUSY
PREFILLED_SYRINGE | INTRAMUSCULAR | 0 refills | Status: DC
  Filled 2022-07-26: qty 0.3, 1d supply, fill #0

## 2022-07-26 NOTE — Assessment & Plan Note (Signed)
Reports mood is stable. On zoloft, abilify and topamax.  He continues to follow up with psychiatry- Dr Chase Picket.

## 2022-07-26 NOTE — Assessment & Plan Note (Signed)
Lab Results  Component Value Date   WBC 7.4 03/15/2022   HGB 13.4 03/15/2022   HCT 39.7 03/15/2022   MCV 92.2 03/15/2022   PLT 267.0 03/15/2022   Normal last time we checked.

## 2022-07-26 NOTE — Assessment & Plan Note (Signed)
Stable on CT 10/23- needs to be repeated 10/24.

## 2022-07-26 NOTE — Assessment & Plan Note (Signed)
BP Readings from Last 3 Encounters:  07/26/22 116/63  04/05/22 118/71  04/01/22 125/65   BP at goal- continue lotrel, hctz and metoprolol

## 2022-07-26 NOTE — Progress Notes (Signed)
Subjective:   By signing my name below, I, Luna Glasgow, attest that this documentation has been prepared under the direction and in the presence of [name], 07/26/2022.   [name], male    DOB: 07/14/1956, 66 y.o.   MRN: [id number]  Chief Complaint  Patient presents with   Hypertension    Here for follow up   Back Pain    Complains of lower back pain    HPI Patient is in today for an office visit.  Hypertension Patients blood pressure is normal this visit. He is complaint with his hydrodiuril 25 mg, 10-40 mg lotrel, and toprol-xl 50 mg medications.  BP Readings from Last 3 Encounters:  07/26/22 116/63  04/05/22 118/71  04/01/22 125/65   Pulse Readings from Last 3 Encounters:  07/26/22 62  04/05/22 66  04/01/22 66   Low back pain Patient is complaining of lower back pain today.  Sleep apnea Patient reports that he wears his CPAP everynight.  GERD Patient states that he is managing his GERD well with his 20 mg Prilosec medication.    Anxiety  Patient states that his anxiety has improved with his zoloft 100 mg and 5 mg Abilify.  Cardiology Patients aortic aneurysm is unchanged.  Health Maintenance Due  Topic Date Due   COVID-19 Vaccine (5 - 2023-24 season) 04/23/2022    Past Medical History:  Diagnosis Date   Anemia 11/16/2014   Anxiety    Aortic aneurysm (HCC)    4.1 cm 2019, ascending aorta   Bladder disease    "an unusual bladder disease; don't know what it's called" (06/30/2015)   Bulging lumbar disc    COVID-19 01/09/2021   Depression    GERD (gastroesophageal reflux disease)    History of hiatal hernia    Hyperglycemia 11/16/2014   Hyperlipemia    Hypertension    Joint pain, knee    Migraine    06/30/2015 "maybe 2-3 times/year; not as bad as I used to have them"   OSA (obstructive sleep apnea)     Past Surgical History:  Procedure Laterality Date   ESOPHAGOGASTRODUODENOSCOPY     ESOPHAGOGASTRODUODENOSCOPY (EGD)  WITH ESOPHAGEAL DILATION  ~ 2014   JOINT REPLACEMENT     right knee   KNEE ARTHROSCOPY Right 08/23/1993   NASAL SEPTUM SURGERY  ~ 1972   TOTAL KNEE ARTHROPLASTY  10/11/2011   Procedure: TOTAL KNEE ARTHROPLASTY;  Surgeon: Rudean Haskell, MD;  Location: [place];  Service: Orthopedics;  Laterality: Right;   TRANSFORAMINAL LUMBAR INTERBODY FUSION W/ MIS 1 LEVEL Right 03/31/2021   Procedure: Right Lumbar five Sacral one Minimally invasive transforaminal lumbar interbody fusion;  Surgeon: Judith Part, MD;  Location: [place];  Service: Neurosurgery;  Laterality: Right;   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)  ~2014    Family History  Problem Relation Age of Onset   Stroke Mother 78       Died of CVA   Hypertension Mother    Anesthesia problems Neg Hx    Hypotension Neg Hx    Malignant hyperthermia Neg Hx    Pseudochol deficiency Neg Hx     Social History   Socioeconomic History   Marital status: Married    Spouse name: Not on file   Number of children: 3   Years of education: Not on file   Highest education level: Not on file  Occupational History   Not on file  Tobacco Use  Smoking status: Former    Packs/day: 1.50    Years: 6.00    Total pack years: 9.00    Types: Cigarettes   Smokeless tobacco: Never   Tobacco comments:    "stopped smoking in ~ 1990"  Substance and Sexual Activity   Alcohol use: No    Alcohol/week: 0.0 standard drinks of alcohol   Drug use: No   Sexual activity: Yes  Other Topics Concern   Not on file  Social History Narrative   3 children    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, research (life sciences))   Married   Completed 12th grade   Enjoys softball   Social Determinants of Radio broadcast assistant Strain: Low Risk  (01/07/2022)   Overall Financial Resource Strain (CARDIA)    Difficulty of Paying Living Expenses: Not hard at all  Food Insecurity: No Food Insecurity (04/14/2022)   Hunger Vital Sign    Worried About  Running Out of Food in the Last Year: Never true    [place] in the Last Year: Never true  Transportation Needs: No Transportation Needs (04/14/2022)   PRAPARE - Hydrologist (Medical): No    Lack of Transportation (Non-Medical): No  Physical Activity: Inactive (01/07/2022)   Exercise Vital Sign    Days of Exercise per Week: 0 days    Minutes of Exercise per Session: 0 min  Stress: No Stress Concern Present (01/07/2022)   [facility]    Feeling of Stress : Not at all  Social Connections: [place] (01/07/2022)   Social Connection and Isolation Panel [NHANES]    Frequency of Communication with Friends and Family: More than three times a week    Frequency of Social Gatherings with Friends and Family: More than three times a week    Attends Religious Services: More than 4 times per year    Active Member of [employer] or Organizations: Yes    Attends Music therapist: More than 4 times per year    Marital Status: Married  Human resources officer Violence: Not At Risk (01/07/2022)   Humiliation, Afraid, Rape, and Kick questionnaire    Fear of Current or Ex-Partner: No    Emotionally Abused: No    Physically Abused: No    Sexually Abused: No    Outpatient Medications Prior to Visit  Medication Sig Dispense Refill   amLODipine-benazepril (LOTREL) 10-40 MG capsule Take 1 capsule by mouth daily. 90 capsule 1   ARIPiprazole (ABILIFY) 5 MG tablet Take 1 tablet (5 mg total) by mouth daily. 90 tablet 1   ascorbic acid (VITAMIN C) 500 MG tablet Take by mouth.     b complex vitamins tablet Take 1 tablet by mouth daily.     ferrous sulfate 325 (65 FE) MG tablet Take 1 tablet (325 mg total) by mouth 2 (two) times daily with a meal. 60 tablet 0   fexofenadine (ALLEGRA) 180 MG tablet Take 1 tablet (180 mg total) by mouth daily. 30 tablet 0   furosemide (LASIX) 20 MG tablet Take 1 tablet by  mouth once daily 90 tablet 0   hydrochlorothiazide (HYDRODIURIL) 25 MG tablet TAKE 1 TABLET(25 MG) BY MOUTH DAILY 90 tablet 1   methocarbamol (ROBAXIN) 500 MG tablet Take by mouth.     metoprolol succinate (TOPROL-XL) 50 MG 24 hr tablet Take one tablet daily ('50MG'$ ) with or immediately following a meal. 90 tablet 1  Multiple Vitamin (MULTIVITAMIN WITH MINERALS) TABS tablet Take 1 tablet by mouth daily.     omeprazole (PRILOSEC) 20 MG capsule Take 1 capsule (20 mg total) by mouth daily. 30 capsule 3   potassium chloride (KLOR-CON M) 10 MEQ tablet Take 2 tablets by mouth in the AM and 1 tablet by mouth in the PM 270 tablet 1   sertraline (ZOLOFT) 100 MG tablet Take 1.5 tablets (150 mg total) by mouth daily. 135 tablet 1   topiramate (TOPAMAX) 50 MG tablet Take 3 tablets (150 mg total) by mouth 2 (two) times daily. 540 tablet 0   No facility-administered medications prior to visit.    No Known Allergies  Review of Systems  Musculoskeletal:  Positive for back pain.       Objective:    Physical Exam Constitutional:      General: He is not in acute distress.    Appearance: Normal appearance. He is not ill-appearing.  HENT:     Head: Normocephalic and atraumatic.     Right Ear: External ear normal.     Left Ear: External ear normal.  Eyes:     Extraocular Movements: Extraocular movements intact.     Pupils: Pupils are equal, round, and reactive to light.  Cardiovascular:     Rate and Rhythm: Normal rate and regular rhythm.     Heart sounds: Normal heart sounds. No murmur heard.    No gallop.  Pulmonary:     Effort: Pulmonary effort is normal. No respiratory distress.     Breath sounds: Normal breath sounds. No wheezing or rales.  Skin:    General: Skin is warm and dry.  Neurological:     Mental Status: He is alert and oriented to person, place, and time.  Psychiatric:        Mood and Affect: Mood normal.        Behavior: Behavior normal.        Judgment: Judgment normal.      BP 116/63 (BP Location: Right Arm, Patient Position: Sitting, Cuff Size: Large)   Pulse 62   Temp 98 F (36.7 C) (Oral)   Resp 16   Wt 254 lb (115.2 kg)   SpO2 98%   BMI 41.00 kg/m  Wt Readings from Last 3 Encounters:  07/26/22 254 lb (115.2 kg)  04/05/22 254 lb 4 oz (115.3 kg)  04/01/22 258 lb (117 kg)       Assessment & Plan:   Problem List Items Addressed This Visit       Unprioritized   OSA (obstructive sleep apnea)    Continues good CPAP compliance.       Hyperlipidemia    Lab Results  Component Value Date   CHOL 171 03/17/2020   HDL 35.00 (L) 03/17/2020   LDLCALC 139 (H) 06/30/2015   LDLDIRECT 114.0 03/17/2020   TRIG 213.0 (H) 03/17/2020   CHOLHDL 5 03/17/2020  Not on medication.  Will check follow up lipid panel.       Hyperglycemia    Lab Results  Component Value Date   HGBA1C 5.4 03/15/2022  Repeat A1C today. Reinforced importance of diet.       Relevant Orders   Hemoglobin A1c   HTN (hypertension)    BP Readings from Last 3 Encounters:  07/26/22 116/63  04/05/22 118/71  04/01/22 125/65  BP at goal- continue lotrel, hctz and metoprolol      GERD (gastroesophageal reflux disease)    Stable on daily omeprazole '20mg'$ . Continue same.  Chronic diastolic heart failure (HCC)    Wt Readings from Last 3 Encounters:  07/26/22 254 lb (115.2 kg)  04/05/22 254 lb 4 oz (115.3 kg)  04/01/22 258 lb (117 kg)  Weight stable continues lasix '20mg'$  once daily.       Aortic aneurysm ([place])    Stable on CT 10/23- needs to be repeated 10/24.       Anxiety and depression    Reports mood is stable. On zoloft, abilify and topamax.  He continues to follow up with psychiatry- Dr Chase Picket.       Anemia    Lab Results  Component Value Date   WBC 7.4 03/15/2022   HGB 13.4 03/15/2022   HCT 39.7 03/15/2022   MCV 92.2 03/15/2022   PLT 267.0 03/15/2022  Normal last time we checked.        Other Visit Diagnoses     Chronic low back pain, unspecified  back pain laterality, unspecified whether sciatica present    -  Primary   Relevant Orders   Ambulatory referral to Orthopedics   Hypertriglyceridemia       Relevant Orders   Lipid panel      No orders of the defined types were placed in this encounter.   I, [name], personally preformed the services described in this documentation.  All medical record entries made by the scribe were at my direction and in my presence.  I have reviewed the chart and discharge instructions (if applicable) and agree that the record reflects my personal performance and is accurate and complete. 07/26/2022.   I,Verona Buck,acting as a Education administrator for [employer], NP.,have documented all relevant documentation on the behalf of Nance Pear, NP,as directed by  Nance Pear, NP while in the presence of Nance Pear, NP.   Nance Pear, NP

## 2022-07-26 NOTE — Assessment & Plan Note (Signed)
Wt Readings from Last 3 Encounters:  07/26/22 254 lb (115.2 kg)  04/05/22 254 lb 4 oz (115.3 kg)  04/01/22 258 lb (117 kg)   Weight stable continues lasix '20mg'$  once daily.

## 2022-07-26 NOTE — Assessment & Plan Note (Signed)
Lab Results  Component Value Date   CHOL 171 03/17/2020   HDL 35.00 (L) 03/17/2020   LDLCALC 139 (H) 06/30/2015   LDLDIRECT 114.0 03/17/2020   TRIG 213.0 (H) 03/17/2020   CHOLHDL 5 03/17/2020   Not on medication.  Will check follow up lipid panel.

## 2022-07-26 NOTE — Assessment & Plan Note (Signed)
Stable on daily omeprazole '20mg'$ . Continue same.

## 2022-07-26 NOTE — Assessment & Plan Note (Signed)
Continues good CPAP compliance.

## 2022-07-26 NOTE — Assessment & Plan Note (Signed)
Lab Results  Component Value Date   HGBA1C 5.4 03/15/2022  Repeat A1C today. Reinforced importance of diet.

## 2022-07-29 DIAGNOSIS — M4326 Fusion of spine, lumbar region: Secondary | ICD-10-CM | POA: Diagnosis not present

## 2022-08-11 DIAGNOSIS — H5203 Hypermetropia, bilateral: Secondary | ICD-10-CM | POA: Diagnosis not present

## 2022-08-11 DIAGNOSIS — H524 Presbyopia: Secondary | ICD-10-CM | POA: Diagnosis not present

## 2022-09-01 DIAGNOSIS — M4326 Fusion of spine, lumbar region: Secondary | ICD-10-CM | POA: Diagnosis not present

## 2022-09-01 DIAGNOSIS — M4807 Spinal stenosis, lumbosacral region: Secondary | ICD-10-CM | POA: Diagnosis not present

## 2022-09-01 DIAGNOSIS — M545 Low back pain, unspecified: Secondary | ICD-10-CM | POA: Diagnosis not present

## 2022-09-03 ENCOUNTER — Other Ambulatory Visit: Payer: Self-pay | Admitting: Psychiatry

## 2022-09-03 ENCOUNTER — Telehealth: Payer: Self-pay | Admitting: Psychiatry

## 2022-09-03 DIAGNOSIS — F333 Major depressive disorder, recurrent, severe with psychotic symptoms: Secondary | ICD-10-CM

## 2022-09-03 NOTE — Telephone Encounter (Signed)
Next visit is 10/11/22. Requesting refill on Sertraline and Abilify called to:  [facility] [place]   Phone: [phone]  Fax: [phone]

## 2022-09-03 NOTE — Telephone Encounter (Signed)
RF available at the pharmacy, patient notified.

## 2022-09-03 NOTE — Telephone Encounter (Signed)
Pt called and said that the pharmacy said they had no refills on his abilfy. However he does have a refill on the script that was sent in October. Please call the pharmacy and check

## 2022-09-08 ENCOUNTER — Telehealth: Payer: Self-pay | Admitting: Psychiatry

## 2022-09-08 NOTE — Telephone Encounter (Signed)
Patient needs PA for Abilify. I completed the form, just needs review before sent to plan. Patient said he only has 2 tablets left. Not sure if that warrants urgent request or not.

## 2022-09-08 NOTE — Telephone Encounter (Signed)
Patient called stating that his prescripiton at pharmacy needs approval from [facility] to be filled. He is down to his last two pills. Ph: [phone] Appt 2/19

## 2022-09-09 NOTE — Telephone Encounter (Signed)
Patient notified

## 2022-09-09 NOTE — Telephone Encounter (Signed)
Prior Approval received effective 09/08/2022-09/09/2023 for Aripiprazole 5 mg #30/30 with [employer]

## 2022-09-10 NOTE — Telephone Encounter (Signed)
Called pharmacy and it went thru, notified patient.

## 2022-09-10 NOTE — Telephone Encounter (Signed)
Jeremy [name] called at 9:40 and said he had just talked to the pharmacy and they told him they were waiting on the dr to get the Abilifiy approved.  I told him it was approved and all they had to do was run the prescription and they would see the approval.  He just said they told him they were waiting on [place] to approved the medication.  He is out of the medication as of today.  Please call the pharmacy to find out what the problem is.

## 2022-09-13 ENCOUNTER — Ambulatory Visit (INDEPENDENT_AMBULATORY_CARE_PROVIDER_SITE_OTHER): Payer: Medicare Other | Admitting: Family

## 2022-09-13 VITALS — BP 127/67 | HR 67 | Temp 98.3°F | Resp 16 | Wt 257.0 lb

## 2022-09-13 DIAGNOSIS — K219 Gastro-esophageal reflux disease without esophagitis: Secondary | ICD-10-CM

## 2022-09-13 DIAGNOSIS — R739 Hyperglycemia, unspecified: Secondary | ICD-10-CM

## 2022-09-13 DIAGNOSIS — I10 Essential (primary) hypertension: Secondary | ICD-10-CM | POA: Diagnosis not present

## 2022-09-13 DIAGNOSIS — I719 Aortic aneurysm of unspecified site, without rupture: Secondary | ICD-10-CM | POA: Diagnosis not present

## 2022-09-13 DIAGNOSIS — J309 Allergic rhinitis, unspecified: Secondary | ICD-10-CM | POA: Diagnosis not present

## 2022-09-13 DIAGNOSIS — F32A Depression, unspecified: Secondary | ICD-10-CM

## 2022-09-13 DIAGNOSIS — F419 Anxiety disorder, unspecified: Secondary | ICD-10-CM | POA: Diagnosis not present

## 2022-09-13 DIAGNOSIS — I5032 Chronic diastolic (congestive) heart failure: Secondary | ICD-10-CM

## 2022-09-13 LAB — BASIC METABOLIC PANEL
BUN: 21 mg/dL (ref 6–23)
CO2: 27 mEq/L (ref 19–32)
Calcium: 9 mg/dL (ref 8.4–10.5)
Chloride: 105 mEq/L (ref 96–112)
Creatinine, Ser: 0.93 mg/dL (ref 0.40–1.50)
GFR: 84.8 mL/min (ref >60.00)
Glucose, Bld: 89 mg/dL (ref 70–99)
Potassium: 3.9 mEq/L (ref 3.5–5.1)
Sodium: 140 mEq/L (ref 135–145)

## 2022-09-13 MED ORDER — FUROSEMIDE 20 MG PO TABS: ORAL_TABLET | ORAL | 1 refills | Status: DC

## 2022-09-13 NOTE — Assessment & Plan Note (Signed)
Stable on prilosec daily.

## 2022-09-13 NOTE — Progress Notes (Signed)
Subjective:   By signing my name below, I, Jeremy [name], attest that this documentation has been prepared under the direction and in the presence of Jeremy Alar, NP. 09/13/2022   [name], male    DOB: Jun 28, 1956, 67 y.o.   MRN: [id number]  Chief Complaint  Patient presents with   Hypertension    Here for follow up     Patient is in today for a follow up visit.   Blood pressure: His blood pressure is doing well during this visit while taking 10-40 mg Lotrel, 25 mg hydrochlorothiazide daily PO, 50 mg metoprolol succinate daily PO. He denies having any swelling in his legs while taking 20 mg lasix daily PO. He is requesting a refill on it as well.  BP Readings from Last 3 Encounters:  09/13/22 127/67  07/26/22 116/63  04/05/22 118/71   Pulse Readings from Last 3 Encounters:  09/13/22 67  07/26/22 62  04/05/22 66   Allergies: He reports having minor allergy flare ups. He continues taking allegra daily.   Mood: His mood is stable. He continues following up with his psychiatrist regularly. He continues taking 50 mg Topamax, 100 mg sertraline and Abilify. Reports no new issues while taking them.   Reflux: His reflux is stable while taking 20 mg Prilosec daily PO.   Aortic CT scan: He will need  follow up aortic CT scan scheduled in October 2024.    Past Medical History:  Diagnosis Date   Anemia 11/16/2014   Anxiety    Aortic aneurysm ([place])    4.1 cm 2019, ascending aorta   Bladder disease    "an unusual bladder disease; don't know what it's called" (06/30/2015)   Bulging lumbar disc    COVID-19 01/09/2021   Depression    GERD (gastroesophageal reflux disease)    History of hiatal hernia    Hyperglycemia 11/16/2014   Hyperlipemia    Hypertension    Joint pain, knee    Migraine    06/30/2015 "maybe 2-3 times/year; not as bad as I used to have them"   OSA (obstructive sleep apnea)     Past Surgical History:  Procedure Laterality Date    ESOPHAGOGASTRODUODENOSCOPY     ESOPHAGOGASTRODUODENOSCOPY (EGD) WITH ESOPHAGEAL DILATION  ~ 2014   JOINT REPLACEMENT     right knee   KNEE ARTHROSCOPY Right 08/23/1993   NASAL SEPTUM SURGERY  ~ 1972   TOTAL KNEE ARTHROPLASTY  10/11/2011   Procedure: TOTAL KNEE ARTHROPLASTY;  Surgeon: Rudean Haskell, MD;  Location: [facility];  Service: Orthopedics;  Laterality: Right;   TRANSFORAMINAL LUMBAR INTERBODY FUSION W/ MIS 1 LEVEL Right 03/31/2021   Procedure: Right Lumbar five Sacral one Minimally invasive transforaminal lumbar interbody fusion;  Surgeon: Judith Part, MD;  Location: [facility];  Service: Neurosurgery;  Laterality: Right;   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)  ~2014    Family History  Problem Relation Age of Onset   Stroke Mother 71       Died of CVA   Hypertension Mother    Anesthesia problems Neg Hx    Hypotension Neg Hx    Malignant hyperthermia Neg Hx    Pseudochol deficiency Neg Hx     Social History   Socioeconomic History   Marital status: Married    Spouse name: Not on file   Number of children: 3   Years of education: Not on file   Highest education level: Not on file  Occupational History   Not on file  Tobacco Use   Smoking status: Former    Packs/day: 1.50    Years: 6.00    Total pack years: 9.00    Types: Cigarettes   Smokeless tobacco: Never   Tobacco comments:    "stopped smoking in ~ 1990"  Substance and Sexual Activity   Alcohol use: No    Alcohol/week: 0.0 standard drinks of alcohol   Drug use: No   Sexual activity: Yes  Other Topics Concern   Not on file  Social History Narrative   3 children    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, research (life sciences))   Married   Completed 12th grade   Enjoys softball   Social Determinants of Radio broadcast assistant Strain: Low Risk  (01/07/2022)   Overall Financial Resource Strain (CARDIA)    Difficulty of Paying Living Expenses: Not hard at all  Food Insecurity: No  Food Insecurity (04/14/2022)   Hunger Vital Sign    Worried About Running Out of Food in the Last Year: Never true    [place] in the Last Year: Never true  Transportation Needs: No Transportation Needs (04/14/2022)   PRAPARE - Hydrologist (Medical): No    Lack of Transportation (Non-Medical): No  Physical Activity: Inactive (01/07/2022)   Exercise Vital Sign    Days of Exercise per Week: 0 days    Minutes of Exercise per Session: 0 min  Stress: No Stress Concern Present (01/07/2022)   [place]    Feeling of Stress : Not at all  Social Connections: [place] (01/07/2022)   Social Connection and Isolation Panel [NHANES]    Frequency of Communication with Friends and Family: More than three times a week    Frequency of Social Gatherings with Friends and Family: More than three times a week    Attends Religious Services: More than 4 times per year    Active Member of [employer] or Organizations: Yes    Attends Music therapist: More than 4 times per year    Marital Status: Married  Human resources officer Violence: Not At Risk (01/07/2022)   Humiliation, Afraid, Rape, and Kick questionnaire    Fear of Current or Ex-Partner: No    Emotionally Abused: No    Physically Abused: No    Sexually Abused: No    Outpatient Medications Prior to Visit  Medication Sig Dispense Refill   amLODipine-benazepril (LOTREL) 10-40 MG capsule Take 1 capsule by mouth daily. 90 capsule 1   ARIPiprazole (ABILIFY) 5 MG tablet Take 1 tablet by mouth once daily 90 tablet 0   ascorbic acid (VITAMIN C) 500 MG tablet Take by mouth.     b complex vitamins tablet Take 1 tablet by mouth daily.     COVID-19 mRNA vaccine 2023-2024 (COMIRNATY) syringe Inject into the muscle. 0.3 mL 0   ferrous sulfate 325 (65 FE) MG tablet Take 1 tablet (325 mg total) by mouth 2 (two) times daily with a meal. 60 tablet 0    fexofenadine (ALLEGRA) 180 MG tablet Take 1 tablet (180 mg total) by mouth daily. 30 tablet 0   hydrochlorothiazide (HYDRODIURIL) 25 MG tablet TAKE 1 TABLET(25 MG) BY MOUTH DAILY 90 tablet 1   methocarbamol (ROBAXIN) 500 MG tablet Take by mouth.     metoprolol succinate (TOPROL-XL) 50 MG 24 hr tablet Take one tablet daily ('50MG'$ ) with or immediately  following a meal. 90 tablet 1   Multiple Vitamin (MULTIVITAMIN WITH MINERALS) TABS tablet Take 1 tablet by mouth daily.     omeprazole (PRILOSEC) 20 MG capsule Take 1 capsule (20 mg total) by mouth daily. 30 capsule 3   potassium chloride (KLOR-CON M) 10 MEQ tablet Take 2 tablets by mouth in the AM and 1 tablet by mouth in the PM 270 tablet 1   sertraline (ZOLOFT) 100 MG tablet Take 1.5 tablets (150 mg total) by mouth daily. 135 tablet 1   topiramate (TOPAMAX) 50 MG tablet Take 3 tablets (150 mg total) by mouth 2 (two) times daily. 540 tablet 0   furosemide (LASIX) 20 MG tablet Take 1 tablet by mouth once daily 90 tablet 0   No facility-administered medications prior to visit.    No Known Allergies  ROS See HPI    Objective:    Physical Exam Constitutional:      General: He is not in acute distress.    Appearance: Normal appearance. He is not ill-appearing.  HENT:     Head: Normocephalic and atraumatic.     Right Ear: External ear normal.     Left Ear: External ear normal.  Eyes:     Extraocular Movements: Extraocular movements intact.     Pupils: Pupils are equal, round, and reactive to light.  Cardiovascular:     Rate and Rhythm: Normal rate and regular rhythm.     Heart sounds: Normal heart sounds. No murmur heard.    No gallop.  Pulmonary:     Effort: Pulmonary effort is normal. No respiratory distress.     Breath sounds: Normal breath sounds. No wheezing or rales.  Skin:    General: Skin is warm and dry.  Neurological:     Mental Status: He is alert and oriented to person, place, and time.  Psychiatric:        Judgment:  Judgment normal.     BP 127/67 (BP Location: Right Arm, Patient Position: Sitting, Cuff Size: Large)   Pulse 67   Temp 98.3 F (36.8 C) (Oral)   Resp 16   Wt 257 lb (116.6 kg)   SpO2 98%   BMI 41.48 kg/m  Wt Readings from Last 3 Encounters:  09/13/22 257 lb (116.6 kg)  07/26/22 254 lb (115.2 kg)  04/05/22 254 lb 4 oz (115.3 kg)       Assessment & Plan:  Primary hypertension Assessment & Plan: BP Readings from Last 3 Encounters:  09/13/22 127/67  07/26/22 116/63  04/05/22 118/71   BP remains at goal. Continue lotrel, hctz and metoprolol.  Orders: -     Basic metabolic panel  Allergic rhinitis, unspecified seasonality, unspecified trigger Assessment & Plan: Fair control on allegra. Continue same.    Anxiety and depression Assessment & Plan: Mood is stable. He continues with psychiatry.    Aortic aneurysm without rupture, unspecified portion of aorta [facility]) Assessment & Plan: Repeat imaging 10/24.   Chronic diastolic heart failure ([place]) Assessment & Plan: Well compensated, continues daily lasix.    Hyperglycemia Assessment & Plan: Lab Results  Component Value Date   HGBA1C 5.7 07/26/2022   Last A1C at goal     Gastroesophageal reflux disease, unspecified whether esophagitis present Assessment & Plan: Stable on prilosec daily.    Other orders -     Furosemide; Take 1 tablet by mouth once daily  Dispense: 90 tablet; Refill: 1    I, Nance Pear, NP, personally preformed the services described  in this documentation.  All medical record entries made by the scribe were at my direction and in my presence.  I have reviewed the chart and discharge instructions (if applicable) and agree that the record reflects my personal performance and is accurate and complete. 09/13/2022   I,Jeremy [name],acting as a Education administrator for Nance Pear, NP.,have documented all relevant documentation on the behalf of Nance Pear, NP,as directed by  Nance Pear, NP while in the presence of Nance Pear, NP.   Nance Pear, NP

## 2022-09-13 NOTE — Assessment & Plan Note (Signed)
Lab Results  Component Value Date   HGBA1C 5.7 07/26/2022   Last A1C at goal

## 2022-09-13 NOTE — Assessment & Plan Note (Signed)
Well compensated, continues daily lasix.

## 2022-09-13 NOTE — Assessment & Plan Note (Signed)
Fair control on allegra. Continue same.

## 2022-09-13 NOTE — Assessment & Plan Note (Addendum)
BP Readings from Last 3 Encounters:  09/13/22 127/67  07/26/22 116/63  04/05/22 118/71   BP remains at goal. Continue lotrel, hctz and metoprolol.

## 2022-09-13 NOTE — Assessment & Plan Note (Signed)
Repeat imaging 10/24.

## 2022-09-13 NOTE — Assessment & Plan Note (Signed)
Mood is stable. He continues with psychiatry.

## 2022-09-15 ENCOUNTER — Ambulatory Visit: Payer: Medicare Other | Admitting: Family

## 2022-09-22 DIAGNOSIS — M545 Low back pain, unspecified: Secondary | ICD-10-CM | POA: Diagnosis not present

## 2022-09-22 DIAGNOSIS — M4326 Fusion of spine, lumbar region: Secondary | ICD-10-CM | POA: Diagnosis not present

## 2022-09-23 ENCOUNTER — Other Ambulatory Visit: Payer: Self-pay | Admitting: Psychiatry

## 2022-09-23 DIAGNOSIS — T50905A Adverse effect of unspecified drugs, medicaments and biological substances, initial encounter: Secondary | ICD-10-CM

## 2022-09-27 DIAGNOSIS — M47812 Spondylosis without myelopathy or radiculopathy, cervical region: Secondary | ICD-10-CM | POA: Diagnosis not present

## 2022-09-27 DIAGNOSIS — M47816 Spondylosis without myelopathy or radiculopathy, lumbar region: Secondary | ICD-10-CM | POA: Diagnosis not present

## 2022-09-27 DIAGNOSIS — M4326 Fusion of spine, lumbar region: Secondary | ICD-10-CM | POA: Diagnosis not present

## 2022-09-27 DIAGNOSIS — M48061 Spinal stenosis, lumbar region without neurogenic claudication: Secondary | ICD-10-CM | POA: Diagnosis not present

## 2022-09-29 DIAGNOSIS — M4326 Fusion of spine, lumbar region: Secondary | ICD-10-CM | POA: Diagnosis not present

## 2022-09-30 ENCOUNTER — Encounter (HOSPITAL_BASED_OUTPATIENT_CLINIC_OR_DEPARTMENT_OTHER): Payer: Self-pay | Admitting: Pulmonary Disease

## 2022-09-30 ENCOUNTER — Telehealth: Payer: Self-pay | Admitting: Family

## 2022-09-30 NOTE — Telephone Encounter (Signed)
Pt dropped off document to be filled out by provider (1 page [facility]) Pt would like document to be faxed when ready to [phone]. Document put at front office tray under providers name.

## 2022-09-30 NOTE — Telephone Encounter (Signed)
Form received is not legible. Called [facility] but was put to a voice mail. Patient advised we need a legible form.

## 2022-10-01 NOTE — Telephone Encounter (Signed)
Patient's wife advised he will need an ov and a new clearance form. She will have him call for an appointment

## 2022-10-04 ENCOUNTER — Ambulatory Visit (HOSPITAL_BASED_OUTPATIENT_CLINIC_OR_DEPARTMENT_OTHER)
Admission: RE | Admit: 2022-10-04 | Discharge: 2022-10-04 | Disposition: A | Discharge status: Home / Self Care | Payer: Medicare Other | Source: Ambulatory Visit | Attending: Family | Admitting: Family

## 2022-10-04 ENCOUNTER — Ambulatory Visit (INDEPENDENT_AMBULATORY_CARE_PROVIDER_SITE_OTHER): Payer: Medicare Other | Admitting: Family

## 2022-10-04 VITALS — BP 126/67 | HR 63 | Temp 97.9°F | Resp 16 | Wt 256.0 lb

## 2022-10-04 DIAGNOSIS — G4733 Obstructive sleep apnea (adult) (pediatric): Secondary | ICD-10-CM | POA: Diagnosis not present

## 2022-10-04 DIAGNOSIS — Z01818 Encounter for other preprocedural examination: Secondary | ICD-10-CM | POA: Insufficient documentation

## 2022-10-04 DIAGNOSIS — R350 Frequency of micturition: Secondary | ICD-10-CM

## 2022-10-04 LAB — CBC WITH DIFFERENTIAL/PLATELET
Basophils Absolute: 0.1 10*3/uL (ref 0.0–0.1)
Basophils Relative: 0.8 % (ref 0.0–3.0)
Eosinophils Absolute: 0.2 10*3/uL (ref 0.0–0.7)
Eosinophils Relative: 2.4 % (ref 0.0–5.0)
HCT: 42.1 % (ref 39.0–52.0)
Hemoglobin: 14.4 g/dL (ref 13.0–17.0)
Lymphocytes Relative: 29 % (ref 12.0–46.0)
Lymphs Abs: 2.2 10*3/uL (ref 0.7–4.0)
MCHC: 34.1 g/dL (ref 30.0–36.0)
MCV: 89.3 fl (ref 78.0–100.0)
Monocytes Absolute: 0.6 10*3/uL (ref 0.1–1.0)
Monocytes Relative: 8.1 % (ref 3.0–12.0)
Neutro Abs: 4.5 10*3/uL (ref 1.4–7.7)
Neutrophils Relative %: 59.7 % (ref 43.0–77.0)
Platelets: 295 10*3/uL (ref 150.0–400.0)
RBC: 4.71 Mil/uL (ref 4.22–5.81)
RDW: 14.1 % (ref 11.5–15.5)
WBC: 7.6 10*3/uL (ref 4.0–10.5)

## 2022-10-04 LAB — COMPREHENSIVE METABOLIC PANEL
ALT: 18 U/L (ref 0–53)
AST: 15 U/L (ref 0–37)
Albumin: 4.2 g/dL (ref 3.5–5.2)
Alkaline Phosphatase: 85 U/L (ref 39–117)
BUN: 18 mg/dL (ref 6–23)
CO2: 25 mEq/L (ref 19–32)
Calcium: 9.4 mg/dL (ref 8.4–10.5)
Chloride: 106 mEq/L (ref 96–112)
Creatinine, Ser: 0.99 mg/dL (ref 0.40–1.50)
GFR: 78.64 mL/min (ref >60.00)
Glucose, Bld: 87 mg/dL (ref 70–99)
Potassium: 3.7 mEq/L (ref 3.5–5.1)
Sodium: 141 mEq/L (ref 135–145)
Total Bilirubin: 0.3 mg/dL (ref 0.2–1.2)
Total Protein: 6.5 g/dL (ref 6.0–8.3)

## 2022-10-04 NOTE — Assessment & Plan Note (Signed)
EKG tracing is personally reviewed.  EKG notes NSR.  No acute changes.  Labs as ordered as well as chest x-ray. Medical clearance pending review of these studies.

## 2022-10-04 NOTE — Assessment & Plan Note (Signed)
On Cpap. He will need close post-operative monitoring.

## 2022-10-04 NOTE — Patient Instructions (Signed)
Please complete x-ray on the first floor.  Complete lab work prior to leaving.

## 2022-10-04 NOTE — Progress Notes (Signed)
Subjective:     [name], male    DOB: Nov 25, 1955, 67 y.o.   MRN: [id number]  Chief Complaint  Patient presents with   Pre-op Exam    HPI Patient is in today for pre-op exam for planned L2-3 laminectomy with Dr. Rennis Harding.  Date has not yet been set. He reports that his back pain is "pretty bad" and pain radiates down the left leg.  OSA- reports good compliance with CPAP.4   Health Maintenance Due  Topic Date Due   COLONOSCOPY (Pts 45-35yr Insurance coverage will need to be confirmed)  09/23/2018   Pneumonia Vaccine 67 Years old (2 of 2 - PCV) 03/17/2021    Past Medical History:  Diagnosis Date   Anemia 11/16/2014   Anxiety    Aortic aneurysm (HCC)    4.1 cm 2019, ascending aorta   Bladder disease    "an unusual bladder disease; don't know what it's called" (06/30/2015)   Bulging lumbar disc    COVID-19 01/09/2021   Depression    GERD (gastroesophageal reflux disease)    History of hiatal hernia    Hyperglycemia 11/16/2014   Hyperlipemia    Hypertension    Joint pain, knee    Migraine    06/30/2015 "maybe 2-3 times/year; not as bad as I used to have them"   OSA (obstructive sleep apnea)     Past Surgical History:  Procedure Laterality Date   ESOPHAGOGASTRODUODENOSCOPY     ESOPHAGOGASTRODUODENOSCOPY (EGD) WITH ESOPHAGEAL DILATION  ~ 2014   JOINT REPLACEMENT     right knee   KNEE ARTHROSCOPY Right 08/23/1993   NASAL SEPTUM SURGERY  ~ 1972   TOTAL KNEE ARTHROPLASTY  10/11/2011   Procedure: TOTAL KNEE ARTHROPLASTY;  Surgeon: SRudean Haskell MD;  Location: [facility]  Service: Orthopedics;  Laterality: Right;   TRANSFORAMINAL LUMBAR INTERBODY FUSION W/ MIS 1 LEVEL Right 03/31/2021   Procedure: Right Lumbar five Sacral one Minimally invasive transforaminal lumbar interbody fusion;  Surgeon: OJudith Part MD;  Location[facility]  Service: Neurosurgery;  Laterality: Right;   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)  ~2014     Family History  Problem Relation Age of Onset   Stroke Mother 620      Died of CVA   Hypertension Mother    Anesthesia problems Neg Hx    Hypotension Neg Hx    Malignant hyperthermia Neg Hx    Pseudochol deficiency Neg Hx     Social History   Socioeconomic History   Marital status: Married    Spouse name: Not on file   Number of children: 3   Years of education: Not on file   Highest education level: Not on file  Occupational History   Not on file  Tobacco Use   Smoking status: Former    Packs/day: 1.50    Years: 6.00    Total pack years: 9.00    Types: Cigarettes   Smokeless tobacco: Never   Tobacco comments:    "stopped smoking in ~ 1990"  Substance and Sexual Activity   Alcohol use: No    Alcohol/week: 0.0 standard drinks of alcohol   Drug use: No   Sexual activity: Yes  Other Topics Concern   Not on file  Social History Narrative   3 children    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, research (life sciences)   Married   Completed 12th grade   Enjoys softball   Social  Determinants of Health   Financial Resource Strain: Low Risk  (01/07/2022)   Overall Financial Resource Strain (CARDIA)    Difficulty of Paying Living Expenses: Not hard at all  Food Insecurity: No Food Insecurity (04/14/2022)   Hunger Vital Sign    Worried About Running Out of Food in the Last Year: Never true    Ran Out of Food in the Last Year: Never true  Transportation Needs: No Transportation Needs (04/14/2022)   PRAPARE - Hydrologist (Medical): No    Lack of Transportation (Non-Medical): No  Physical Activity: Inactive (01/07/2022)   Exercise Vital Sign    Days of Exercise per Week: 0 days    Minutes of Exercise per Session: 0 min  Stress: No Stress Concern Present (01/07/2022)   [place]    Feeling of Stress : Not at all  Social Connections: [place] (01/07/2022)   Social Connection  and Isolation Panel [NHANES]    Frequency of Communication with Friends and Family: More than three times a week    Frequency of Social Gatherings with Friends and Family: More than three times a week    Attends Religious Services: More than 4 times per year    Active Member of [employer] or Organizations: Yes    Attends Music therapist: More than 4 times per year    Marital Status: Married  Human resources officer Violence: Not At Risk (01/07/2022)   Humiliation, Afraid, Rape, and Kick questionnaire    Fear of Current or Ex-Partner: No    Emotionally Abused: No    Physically Abused: No    Sexually Abused: No    Outpatient Medications Prior to Visit  Medication Sig Dispense Refill   amLODipine-benazepril (LOTREL) 10-40 MG capsule Take 1 capsule by mouth daily. 90 capsule 1   ARIPiprazole (ABILIFY) 5 MG tablet Take 1 tablet by mouth once daily 90 tablet 0   ascorbic acid (VITAMIN C) 500 MG tablet Take by mouth.     b complex vitamins tablet Take 1 tablet by mouth daily.     COVID-19 mRNA vaccine 2023-2024 (COMIRNATY) syringe Inject into the muscle. 0.3 mL 0   ferrous sulfate 325 (65 FE) MG tablet Take 1 tablet (325 mg total) by mouth 2 (two) times daily with a meal. 60 tablet 0   fexofenadine (ALLEGRA) 180 MG tablet Take 1 tablet (180 mg total) by mouth daily. 30 tablet 0   furosemide (LASIX) 20 MG tablet Take 1 tablet by mouth once daily 90 tablet 1   hydrochlorothiazide (HYDRODIURIL) 25 MG tablet TAKE 1 TABLET(25 MG) BY MOUTH DAILY 90 tablet 1   methocarbamol (ROBAXIN) 500 MG tablet Take by mouth.     metoprolol succinate (TOPROL-XL) 50 MG 24 hr tablet Take one tablet daily (50MG) with or immediately following a meal. 90 tablet 1   Multiple Vitamin (MULTIVITAMIN WITH MINERALS) TABS tablet Take 1 tablet by mouth daily.     omeprazole (PRILOSEC) 20 MG capsule Take 1 capsule (20 mg total) by mouth daily. 30 capsule 3   potassium chloride (KLOR-CON M) 10 MEQ tablet Take 2 tablets by mouth  in the AM and 1 tablet by mouth in the PM 270 tablet 1   sertraline (ZOLOFT) 100 MG tablet Take 1.5 tablets (150 mg total) by mouth daily. 135 tablet 1   topiramate (TOPAMAX) 50 MG tablet TAKE 3 TABLETS BY MOUTH TWICE DAILY 540 tablet 0   No facility-administered  medications prior to visit.    No Known Allergies  Review of Systems  Constitutional:  Negative for weight loss.  HENT:  Positive for congestion (chronic allergic rhinitis).   Respiratory:  Positive for cough (reports cough has been present for several months, no fever).   Cardiovascular:  Negative for chest pain and palpitations.  Gastrointestinal:  Negative for constipation and diarrhea.  Genitourinary:  Positive for frequency. Negative for dysuria.  Musculoskeletal:  Positive for back pain.  Neurological:  Negative for headaches.  Psychiatric/Behavioral:         Mood is stable- continues to follow with Dr. Clovis Pu       Objective:    Physical Exam Constitutional:      General: He is not in acute distress.    Appearance: He is well-developed.  HENT:     Head: Normocephalic and atraumatic.  Cardiovascular:     Rate and Rhythm: Normal rate and regular rhythm.     Heart sounds: No murmur heard. Pulmonary:     Effort: Pulmonary effort is normal. No respiratory distress.     Breath sounds: Normal breath sounds. No wheezing or rales.  Skin:    General: Skin is warm and dry.  Neurological:     Mental Status: He is alert and oriented to person, place, and time.  Psychiatric:        Behavior: Behavior normal.        Thought Content: Thought content normal.     BP 126/67 (BP Location: Right Arm, Patient Position: Sitting, Cuff Size: Large)   Pulse 63   Temp 97.9 F (36.6 C) (Oral)   Resp 16   Wt 256 lb (116.1 kg)   SpO2 97%   BMI 41.32 kg/m  Wt Readings from Last 3 Encounters:  10/04/22 256 lb (116.1 kg)  09/13/22 257 lb (116.6 kg)  07/26/22 254 lb (115.2 kg)       Assessment & Plan:       I am  having Jeremy [name] "Tim" maintain his b complex vitamins, multivitamin with minerals, ferrous sulfate, fexofenadine, omeprazole, ascorbic acid, methocarbamol, potassium chloride, sertraline, hydrochlorothiazide, amLODipine-benazepril, metoprolol succinate, Comirnaty, ARIPiprazole, furosemide, and topiramate.  No orders of the defined types were placed in this encounter.

## 2022-10-05 LAB — URINE CULTURE
MICRO NUMBER:: 14551683
SPECIMEN QUALITY:: ADEQUATE

## 2022-10-05 NOTE — Progress Notes (Unsigned)
10/07/22- 68 yoM retired former smoker(9 pkyrs quit 1990) needing surgical clearance. Previously followed by Dr Elsworth Soho for OSA Medical problem list includes OSA,  Allergic Rhinitis, Aortic Aneurysm, GERD, HTN, Hyperlipidemia, Lumbar Radiculopathy, Morbid Obesity, Umbilical Hernia, Depression,  NPSG/Split 11/28/17- AHI 44.7/ hr, desaturation to 71%. Titration to BIPAP 23/19 with treatment-emergent centrals., body weight 255 lbs LOV Dr Elsworth Soho, 2020- CPAP 12-17/ [place]     wirh treatment emergent centrals -----Patient is here for surgical clearance. He is having back surgery. Date is unknown Pt is still doing well with cpap machine  Preop clearance for surgical fusion lumbar spine CPAP auto 12/17/ [facility]     ResMed AirSense 10 Download compliance 83%, AHI 4.9/ hr    centrals = obstructive Body weight today 258 lbs Download reviewed. Sometimes takes it off if feels "smothered" with no pattern identified.  Denies heart/ lung disease. Some nonproductive cough. CXR reviewed. ENT surgery for septoplasty.  CXR 10/04/22 FINDINGS: The heart size and mediastinal contours are within normal limits. Both lungs are clear. The visualized skeletal structures are unremarkable. IMPRESSION: No active cardiopulmonary disease.  Prior to Admission medications   Medication Sig Start Date End Date Taking? Authorizing Provider  amLODipine-benazepril (LOTREL) 10-40 MG capsule Take 1 capsule by mouth daily. 06/30/22  Yes [name], NP  ARIPiprazole (ABILIFY) 5 MG tablet Take 1 tablet by mouth once daily 09/03/22  Yes Cottle, Billey Co., MD  ascorbic acid (VITAMIN C) 500 MG tablet Take by mouth.   Yes [provider]  b complex vitamins tablet Take 1 tablet by mouth daily.   Yes [provider]  COVID-19 mRNA vaccine [phone] (COMIRNATY) syringe Inject into the muscle. 07/26/22  Yes Carlyle Basques, MD  ferrous sulfate 325 (65 FE) MG tablet Take 1 tablet (325 mg total) by mouth 2 (two) times  daily with a meal. 10/25/17  Yes Georgette Shell, MD  fexofenadine (ALLEGRA) 180 MG tablet Take 1 tablet (180 mg total) by mouth daily. 10/25/17  Yes Georgette Shell, MD  furosemide (LASIX) 20 MG tablet Take 1 tablet by mouth once daily 09/13/22  Yes [name], NP  hydrochlorothiazide (HYDRODIURIL) 25 MG tablet TAKE 1 TABLET(25 MG) BY MOUTH DAILY 06/30/22  Yes [name], NP  methocarbamol (ROBAXIN) 500 MG tablet Take by mouth. 04/08/21  Yes [provider]  metoprolol succinate (TOPROL-XL) 50 MG 24 hr tablet Take one tablet daily (50MG) with or immediately following a meal. 06/30/22  Yes [name], NP  Multiple Vitamin (MULTIVITAMIN WITH MINERALS) TABS tablet Take 1 tablet by mouth daily.   Yes [provider]  omeprazole (PRILOSEC) 20 MG capsule Take 1 capsule (20 mg total) by mouth daily. 08/25/21  Yes [name], NP  potassium chloride (KLOR-CON M) 10 MEQ tablet Take 2 tablets by mouth in the AM and 1 tablet by mouth in the PM 03/12/22  Yes [name], NP  sertraline (ZOLOFT) 100 MG tablet Take 1.5 tablets (150 mg total) by mouth daily. 06/10/22  Yes Cottle, Billey Co., MD  topiramate (TOPAMAX) 50 MG tablet TAKE 3 TABLETS BY MOUTH TWICE DAILY 09/24/22  Yes Cottle, Billey Co., MD   Past Medical History:  Diagnosis Date   Anemia 11/16/2014   Anxiety    Aortic aneurysm (HCC)    4.1 cm 2019, ascending aorta   Bladder disease    "an unusual bladder disease; don't know what it's called" (06/30/2015)   Bulging lumbar disc    COVID-19 01/09/2021   Depression  GERD (gastroesophageal reflux disease)    History of hiatal hernia    Hyperglycemia 11/16/2014   Hyperlipemia    Hypertension    Joint pain, knee    Migraine    06/30/2015 "maybe 2-3 times/year; not as bad as I used to have them"   OSA (obstructive sleep apnea)    Past Surgical History:  Procedure Laterality Date   ESOPHAGOGASTRODUODENOSCOPY      ESOPHAGOGASTRODUODENOSCOPY (EGD) WITH ESOPHAGEAL DILATION  ~ 2014   JOINT REPLACEMENT     right knee   KNEE ARTHROSCOPY Right 08/23/1993   NASAL SEPTUM SURGERY  ~ 1972   TOTAL KNEE ARTHROPLASTY  10/11/2011   Procedure: TOTAL KNEE ARTHROPLASTY;  Surgeon: Rudean Haskell, MD;  Location: [facility];  Service: Orthopedics;  Laterality: Right;   TRANSFORAMINAL LUMBAR INTERBODY FUSION W/ MIS 1 LEVEL Right 03/31/2021   Procedure: Right Lumbar five Sacral one Minimally invasive transforaminal lumbar interbody fusion;  Surgeon: Judith Part, MD;  Location: [facility];  Service: Neurosurgery;  Laterality: Right;   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)  ~2014   Family History  Problem Relation Age of Onset   Stroke Mother 26       Died of CVA   Hypertension Mother    Anesthesia problems Neg Hx    Hypotension Neg Hx    Malignant hyperthermia Neg Hx    Pseudochol deficiency Neg Hx    Social History   Socioeconomic History   Marital status: Married    Spouse name: Not on file   Number of children: 3   Years of education: Not on file   Highest education level: Not on file  Occupational History   Not on file  Tobacco Use   Smoking status: Former    Packs/day: 1.50    Years: 6.00    Total pack years: 9.00    Types: Cigarettes   Smokeless tobacco: Never   Tobacco comments:    "stopped smoking in ~ 1990"  Substance and Sexual Activity   Alcohol use: No    Alcohol/week: 0.0 standard drinks of alcohol   Drug use: No   Sexual activity: Yes  Other Topics Concern   Not on file  Social History Narrative   3 children    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, research (life sciences))   Married   Completed 12th grade   Enjoys softball   Social Determinants of Radio broadcast assistant Strain: Low Risk  (01/07/2022)   Overall Financial Resource Strain (CARDIA)    Difficulty of Paying Living Expenses: Not hard at all  Food Insecurity: No Food Insecurity (04/14/2022)   Hunger  Vital Sign    Worried About Running Out of Food in the Last Year: Never true    Ran Out of Food in the Last Year: Never true  Transportation Needs: No Transportation Needs (04/14/2022)   PRAPARE - Hydrologist (Medical): No    Lack of Transportation (Non-Medical): No  Physical Activity: Inactive (01/07/2022)   Exercise Vital Sign    Days of Exercise per Week: 0 days    Minutes of Exercise per Session: 0 min  Stress: No Stress Concern Present (01/07/2022)   [place]    Feeling of Stress : Not at all  Social Connections: [place] (01/07/2022)   Social Connection and Isolation Panel [NHANES]    Frequency of Communication with Friends and Family: More than three  times a week    Frequency of Social Gatherings with Friends and Family: More than three times a week    Attends Religious Services: More than 4 times per year    Active Member of [employer] or Organizations: Yes    Attends Music therapist: More than 4 times per year    Marital Status: Married  Human resources officer Violence: Not At Risk (01/07/2022)   Humiliation, Afraid, Rape, and Kick questionnaire    Fear of Current or Ex-Partner: No    Emotionally Abused: No    Physically Abused: No    Sexually Abused: No   ROS-see HPI   + = positive Constitutional:    weight loss, night sweats, fevers, chills, fatigue, lassitude. HEENT:    headaches, difficulty swallowing, tooth/dental problems, sore throat,       sneezing, itching, ear ache, nasal congestion, post nasal drip, snoring CV:    chest pain, orthopnea, PND, +swelling in lower extremities, anasarca,                                   dizziness, palpitations Resp:   +shortness of breath with exertion or at rest.                productive cough,   non-productive cough, coughing up of blood.              change in color of mucus.  wheezing.   Skin:    rash or lesions. GI:   No-   heartburn, indigestion, abdominal pain, nausea, vomiting, diarrhea,                 change in bowel habits, loss of appetite GU: dysuria, change in color of urine, no urgency or frequency.   flank pain. MS:   joint pain, stiffness, decreased range of motion, back pain. Neuro-     nothing unusual Psych:  change in mood or affect.  +depression or +anxiety.   memory loss.  OBJ- Physical Exam General- Alert, Oriented, Affect-appropriate, Distress- none acute, +obese Skin- rash-none, lesions- none, excoriation- none Lymphadenopathy- none Head- atraumatic            Eyes- Gross vision intact, PERRLA, conjunctivae and secretions clear            Ears- Hearing, canals-normal            Nose- Clear, no-Septal dev, mucus, polyps, erosion, perforation             Throat- Mallampati III , mucosa clear , drainage- none, tonsils- atrophic Neck- flexible , trachea midline, no stridor , thyroid nl, carotid no bruit Chest - symmetrical excursion , unlabored           Heart/CV- RRR , no murmur , no gallop  , no rub, nl s1 s2                           - JVD- none , edema- none, stasis changes- none, varices- none           Lung- clear to P&A, wheeze- none, cough- none , dullness-none, rub- none           Chest wall-  Abd-  Br/ Gen/ Rectal- Not done, not indicated Extrem- cyanosis- none, clubbing, none, atrophy- none, strength- nl Neuro- grossly intact to observation

## 2022-10-07 ENCOUNTER — Ambulatory Visit: Payer: Medicare Other | Admitting: Internal Medicine

## 2022-10-07 ENCOUNTER — Encounter: Payer: Self-pay | Admitting: Internal Medicine

## 2022-10-07 VITALS — BP 130/88 | HR 68 | Ht 66.0 in | Wt 258.4 lb

## 2022-10-07 DIAGNOSIS — Z01818 Encounter for other preprocedural examination: Secondary | ICD-10-CM

## 2022-10-07 DIAGNOSIS — G4733 Obstructive sleep apnea (adult) (pediatric): Secondary | ICD-10-CM

## 2022-10-07 NOTE — Assessment & Plan Note (Signed)
Preoperative clearance form signed for lumbar spine fusion after discussion.  No pulmonary or sleep medicine issue identified for which surgery should be deferred.

## 2022-10-07 NOTE — Patient Instructions (Addendum)
You are clear from a pulmonary standpoint for necessary surgery  Order- DME [facility]- please replace old CPAP machine, change to auto 12-20, mask of choice, humidifier, supplies, Please install AirView/ card  Please call if we can help

## 2022-10-07 NOTE — Assessment & Plan Note (Signed)
Benefits from CPAP with good compliance and control.  We think the machine is at least 68 years old and eligible for replacement. Plan-replace old CPAP machine, changing AutoSet pressure range to 12-20

## 2022-10-11 ENCOUNTER — Encounter: Payer: Self-pay | Admitting: Psychiatry

## 2022-10-11 ENCOUNTER — Ambulatory Visit: Payer: Medicare Other | Admitting: Psychiatry

## 2022-10-11 DIAGNOSIS — F411 Generalized anxiety disorder: Secondary | ICD-10-CM

## 2022-10-11 DIAGNOSIS — T50905A Adverse effect of unspecified drugs, medicaments and biological substances, initial encounter: Secondary | ICD-10-CM

## 2022-10-11 DIAGNOSIS — R635 Abnormal weight gain: Secondary | ICD-10-CM

## 2022-10-11 DIAGNOSIS — F333 Major depressive disorder, recurrent, severe with psychotic symptoms: Secondary | ICD-10-CM

## 2022-10-11 DIAGNOSIS — F4001 Agoraphobia with panic disorder: Secondary | ICD-10-CM | POA: Diagnosis not present

## 2022-10-11 DIAGNOSIS — F3340 Major depressive disorder, recurrent, in remission, unspecified: Secondary | ICD-10-CM | POA: Diagnosis not present

## 2022-10-11 MED ORDER — ARIPIPRAZOLE 5 MG PO TABS: 5.0000 mg | ORAL_TABLET | Freq: Every day | ORAL | 1 refills | Status: DC

## 2022-10-11 MED ORDER — NALTREXONE HCL 50 MG PO TABS: 25.0000 mg | ORAL_TABLET | Freq: Two times a day (BID) | ORAL | 0 refills | Status: DC

## 2022-10-11 MED ORDER — SERTRALINE HCL 100 MG PO TABS: 150.0000 mg | ORAL_TABLET | Freq: Every day | ORAL | 1 refills | Status: DC

## 2022-10-11 NOTE — Patient Instructions (Addendum)
Reduce topiramate to 2 twice daily for 2 weeks then 1 twice daily for 2 weeks, then 1 daily for 1 week, then stop it.  Talk to doctor about [facility] for weight loss.

## 2022-10-11 NOTE — Progress Notes (Signed)
Jeremy [name] [id number] May 23, 1956 67 y.o.     Subjective:   Patient ID:  Jeremy [name] is a 67 y.o. (DOB December 01, 1955) male.  Chief Complaint:  Chief Complaint  Patient presents with   Follow-up   Depression   Anxiety    Depression        Associated symptoms include no decreased concentration and no suicidal ideas.  Past medical history includes anxiety.   Anxiety Patient reports no chest pain, confusion, decreased concentration, nervous/anxious behavior, palpitations or suicidal ideas.      Jeremy [name] presents  today for follow-up of anxiety and depression.   He had been out on disability due to his back since September at that time.  seen April 2020.  Since retirement he has been under less stress and had asked to reduce and hopefully eliminate some medications.  We reduced Abilify from 7.5 to 5 mg daily.  As of December 04, 2019 the following is noted: Still good. nOTHing worse with dose reduction.   Benefit from meds. Help depression, anxiety and anger.  They are all under control. No mood swings nor prolonged depression.  Anxiety OK.  Wants to try to reduce meds again if possible bc less stress as noted. Plan because of sexual side effects with sertraline he prefers to reduce sertraline to 50 mg daily and continue Abilify 5 mg daily.  02/21/2020 appointment with the following noted: Doing well still after reducing sertraline to 50 mg daily but no improvement in sexual function.  depression and anxiety  Are still under control. No panic.   Plan:  We discussed the options of trying to eliminate gradually the Abilify versus reducing the sertraline which is probably more risky.  However he is having sexual side effects from sertraline and no side effects from Abilify.  He prefers to stop the sertraline and see if he can get by without it. Therefore reduce sertraline to 25 mg daily for 2 weeks then stop it  05/07/20 appt with the following noted: Off sertraline without more  depression or anxiety.  No panic.  No withdrawal effects. Sexual function is not better.  Erection problems. Tried Viagra without help.  Saw urologist and had shots which helped then stopped helping. He wants to try stopping Abilify to see if sexual function will return. Plan: Since retiring his stress level has reduced significantly.  His depression and anxiety are under control.  He wants to try to reduce medications again.   He wants to try DC Abilify to see if sexual function is better despite risk relapse. He's been fine so far off sertraline.    06/02/2020 phone call from patient:Pt called and said that he had a panic attack yesterday. He wants to know if he can go back on his medications MD response: Fairly recently discontinued sertraline 50 mg daily.  That is the best antipanic med.  Have him restart sertraline 25 mg daily for 7 days and then 50 mg daily.  Will take several weeks to help.  Hold off the Abilify bc that was for depression. 06/13/20 TC: Pt LM on VM needing apt today to see CC. Returned call, Pt stated he is still having issues with anxiety and needs to see CC today. His follow up apt is 12/13.   2 panic attacks but close today.  06/16/2020 urgent appointment with the following noted: Seen with wife Panic and wanted to restart meds.  Wife says he's more depressed.  Only sleeps and eats and  is like a walking zombie without meds. Lost father and stepmother and friend.  She thinks he's depressed.  B committed suicide years ago.   Now he thinks I'm going to leave him. Family notices his depression.  Having to help adopted father with cancer. He agrees with wife. Tense and SOB with anxiety. Wife thinks he represses things.  Mo left him in an orphanage and has abandonment issues even with her.  Thinking she will leave. Plan: Relapse worse off meds  Increase sertraline to 100 mg daily.  May need to go higher Increase Abilify to 10 mg daily for severe depression with paranoid  rumination. OK prn Xanax prn panic   08/04/20 appt with following noted:  Seen with wife, [name] Better with tension.  Feels less down too.   SE increased appetite.  But had recently lost 20 # and lately snacking more. Wife sees dramatic turnaround and he's more talkative and engaged and friends notice too.  It's working. No Xanax used.  Staying awake during day.   Not quite back to normal but really improved per wife.   Plan: If he is back to baseline at follow-up we will consider reducing the Abilify somewhat in order to reduce the weight gain potential if possible.  10/07/2020 appointment with following noted: Still sleeping too much and eating a lot.  Otherwise is OK.  Doesn't feel depressed.  Interest is a little better.  Not anxious.    Not aware of SE otherwise.  Not worrying too much.  Has retired.  Has reduced his stress significantly. Using CPAP. No panic. Plan: Continue sertraline 100 mg daily Reduce Abilify to 5 mg daily for the history of severe paranoid rumination and depression which are improved, but apparently it's sedating him.  12/11/2020 appointment with the following noted: Reduced Abilify to 5 mg daily without much change.   Mood been alright withuot depression.  Anxiety is not a problem. Sleep 9 hours at night and then naps in the morning after breakfast and then in afternoon.  Not much to do.  Not much he wants to do in retirement except golf and physical limitations hurt that. No other hobbies.     Weight stable.  He stopped the Abilify couple of weeks ago bc insurance told him they would not cover it and it would be $800 dollars. Plan:  Continue sertraline 100 mg daily He stopped Abilify 5 mg 2 weeks ago DT cost Disc risk relapse.  Call if relapse bc at risk for a couple of mos.  03/24/2021 appointment with the following noted:  Seen with daughter, Jeremy [name] Patient stopped Abilify 2 weeks prior to last appointment due to being told it would be $800.  He wanted to stay  off the medication and was doing okay at the time of appointment. Taking sertraline 150 mg daily. Xanax makes him too sleepy. Alright.  A little bit of anxiety.  Worrying about things with wife's problems with her brother's death.   Jeremy [name] notices his anxiety is out of control.  He's constantly hovering her and obsessing she may leave him again as in the past.  Definitely not himself and family notices not interacting with people.  She's noticed changes worsening over the year. B in law died 01/13/2023 making things worse. Sleep is interrupted.  He recognizes he's more fearful than normal. Is sad also. Pending back surgery 03/31/2021 Plan: Because he relapsed again off of Abilify we will do the following: Continue sertraline 150 mg daily Restart  Abilify 10 mg daily bc did best on it.  06/08/21 appt noted: Better with Abilify and SE hungry with weight gain of ? Amount. Rubs teeth front together at rest. No depression and anxiety now.  Patient reports stable mood and denies depressed or irritable moods.  Patient denies any recent difficulty with anxiety.  Patient denies difficulty with sleep initiation or maintenance. Denies appetite disturbance.  Patient reports that energy and motivation have been good.  Patient denies any difficulty with concentration.  Patient denies any suicidal ideation. Back pain not gone but better after surgery He remains on  Continue sertraline 150 mg daily and Abilify 10 Plan: Continue sertraline 150 mg daily Continue Abilify but we will reduce to 5 mg bc hunger and rubbing teeth together may be SE More paranoid off Abilify.  09/08/2021 appointment noted: Been doing good since here after reduction to Abilify 5 mg daily.  Thinks Abilify SE hunger and weight gain. Rubbing teeth together stopped with reduction in dose. Relapsed  Continues sertraline 150 mg daily. Plan: Continue sertraline 150 mg daily Continue Abilify 5 mg  bc hunger and rubbing teeth together may be  SE More paranoid off Abilify.  12/08/2021 appointment with the following noted: Back surgery August and it still bothers him to walk.  No exercise.  No weight loss. Good with depression.  F in law died last night with complications of CA and hip fx.  He was good man.   Anxiety also been ok without fear or paranoia. SE occ diarrhea and nothing else. Sleep is good.   Wife [name] with problems psych dealing with brother's death and F's cancer. Plan: Increase metoformin to 1000 mg BID and if no benefit will stop it.  No benefit at 500 mg BID Continue sertraline 150 mg daily. Continue Abilify 5 mg daily because he was paranoid off of it  03/10/2022 appointment with the following noted: Had too much nausea on the higher dose of metformin and stopped it Wanted to try topiramate to see if it helped with his hunger problems and started 25 mg twice daily to then increase to 50 mg twice daily With topiramate lost a couple of # but seemed it quit working.  No SE with it. Mood been alright I guess.  Not depressed and no panic. No fear.  Sleep is good. Wants to lose mor eweight if possible. Plan: Increase topiramate to 75 mg BID for wt control and if NR after a couple of weeks then can increase to 100 mg BID  06/10/22 appt noted: Not losing wt with topiramate but no scales at home.   No exercise bc of back pain. Some reduction in appetite.   SE mild diarrhea but less than metformin. Current psych meds: topiramate 100 mg BID, sertraline 150 mg,  Abilify 5 mg daily Depression is pretty good.  No panic.  Anxiety level is OK. No other med problems except back and no history kidney stones. Cut out bread and potatoes. Plan: Increase topiramate to 150 mg BID for wt control bc needs to lose wt DT back pain  10/11/22 appt noted: No difference with increase topiramate 150 mg BID,  no benefit with that med.   Current others: sertraliline and Abilify Pretty good mood.  Not depressed.  Anxiety is OK too.  No panic.    SE non unless wt issues. Sleep is good.  Needs another back surgery.  2nd opinion.    Needs 2 surgeries.    Past history of psychiatric disability.  Not sure how many episodes of depression he's had in life.  Historically about the same levels of depression and anxiety   Past Psychiatric Medication Trials: Sertraline 100 Abilify 10 helped SE weight gain Xanax 0.5 Metformin N Topiramate for wt NR  Review of Systems:  Review of Systems  Constitutional:  Positive for unexpected weight change.  Cardiovascular:  Negative for chest pain and palpitations.  Genitourinary:        ED persistent  Musculoskeletal:  Positive for arthralgias and back pain.  Psychiatric/Behavioral:  Negative for agitation, behavioral problems, confusion, decreased concentration, dysphoric mood, hallucinations, self-injury, sleep disturbance and suicidal ideas. The patient is not nervous/anxious and is not hyperactive.     Medications: I have reviewed the patient's current medications.  Current Outpatient Medications  Medication Sig Dispense Refill   amLODipine-benazepril (LOTREL) 10-40 MG capsule Take 1 capsule by mouth daily. 90 capsule 1   ascorbic acid (VITAMIN C) 500 MG tablet Take by mouth.     b complex vitamins tablet Take 1 tablet by mouth daily.     COVID-19 mRNA vaccine 2023-2024 (COMIRNATY) syringe Inject into the muscle. 0.3 mL 0   ferrous sulfate 325 (65 FE) MG tablet Take 1 tablet (325 mg total) by mouth 2 (two) times daily with a meal. 60 tablet 0   fexofenadine (ALLEGRA) 180 MG tablet Take 1 tablet (180 mg total) by mouth daily. 30 tablet 0   furosemide (LASIX) 20 MG tablet Take 1 tablet by mouth once daily 90 tablet 1   hydrochlorothiazide (HYDRODIURIL) 25 MG tablet TAKE 1 TABLET(25 MG) BY MOUTH DAILY 90 tablet 1   methocarbamol (ROBAXIN) 500 MG tablet Take by mouth.     metoprolol succinate (TOPROL-XL) 50 MG 24 hr tablet Take one tablet daily (50MG) with or immediately following a meal. 90  tablet 1   Multiple Vitamin (MULTIVITAMIN WITH MINERALS) TABS tablet Take 1 tablet by mouth daily.     naltrexone (DEPADE) 50 MG tablet Take 0.5 tablets (25 mg total) by mouth 2 (two) times daily. 30 tablet 0   omeprazole (PRILOSEC) 20 MG capsule Take 1 capsule (20 mg total) by mouth daily. 30 capsule 3   potassium chloride (KLOR-CON M) 10 MEQ tablet Take 2 tablets by mouth in the AM and 1 tablet by mouth in the PM 270 tablet 1   ARIPiprazole (ABILIFY) 5 MG tablet Take 1 tablet (5 mg total) by mouth daily. 90 tablet 1   sertraline (ZOLOFT) 100 MG tablet Take 1.5 tablets (150 mg total) by mouth daily. 135 tablet 1   No current facility-administered medications for this visit.    Medication Side Effects: ED with Zoloft.  Viagra failed.  Using shots.  Allergies: No Known Allergies  Past Medical History:  Diagnosis Date   Anemia 11/16/2014   Anxiety    Aortic aneurysm (HCC)    4.1 cm 2019, ascending aorta   Bladder disease    "an unusual bladder disease; don't know what it's called" (06/30/2015)   Bulging lumbar disc    COVID-19 01/09/2021   Depression    GERD (gastroesophageal reflux disease)    History of hiatal hernia    Hyperglycemia 11/16/2014   Hyperlipemia    Hypertension    Joint pain, knee    Migraine    06/30/2015 "maybe 2-3 times/year; not as bad as I used to have them"   OSA (obstructive sleep apnea)     Family History  Problem Relation Age of Onset   Stroke Mother 36  Died of CVA   Hypertension Mother    Anesthesia problems Neg Hx    Hypotension Neg Hx    Malignant hyperthermia Neg Hx    Pseudochol deficiency Neg Hx     Social History   Socioeconomic History   Marital status: Married    Spouse name: Not on file   Number of children: 3   Years of education: Not on file   Highest education level: Not on file  Occupational History   Not on file  Tobacco Use   Smoking status: Former    Packs/day: 1.50    Years: 6.00    Total pack years: 9.00     Types: Cigarettes   Smokeless tobacco: Never   Tobacco comments:    "stopped smoking in ~ 1990"  Substance and Sexual Activity   Alcohol use: No    Alcohol/week: 0.0 standard drinks of alcohol   Drug use: No   Sexual activity: Yes  Other Topics Concern   Not on file  Social History Narrative   3 children    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, research (life sciences))   Married   Completed 12th grade   Enjoys softball   Social Determinants of Radio broadcast assistant Strain: Low Risk  (01/07/2022)   Overall Financial Resource Strain (CARDIA)    Difficulty of Paying Living Expenses: Not hard at all  Food Insecurity: No Food Insecurity (04/14/2022)   Hunger Vital Sign    Worried About Running Out of Food in the Last Year: Never true    [place] in the Last Year: Never true  Transportation Needs: No Transportation Needs (04/14/2022)   PRAPARE - Hydrologist (Medical): No    Lack of Transportation (Non-Medical): No  Physical Activity: Inactive (01/07/2022)   Exercise Vital Sign    Days of Exercise per Week: 0 days    Minutes of Exercise per Session: 0 min  Stress: No Stress Concern Present (01/07/2022)   [place]    Feeling of Stress : Not at all  Social Connections: [place] (01/07/2022)   Social Connection and Isolation Panel [NHANES]    Frequency of Communication with Friends and Family: More than three times a week    Frequency of Social Gatherings with Friends and Family: More than three times a week    Attends Religious Services: More than 4 times per year    Active Member of [employer] or Organizations: Yes    Attends Archivist Meetings: More than 4 times per year    Marital Status: Married  Human resources officer Violence: Not At Risk (01/07/2022)   Humiliation, Afraid, Rape, and Kick questionnaire    Fear of Current or Ex-Partner: No    Emotionally Abused: No     Physically Abused: No    Sexually Abused: No    Past Medical History, Surgical history, Social history, and Family history were reviewed and updated as appropriate.   Please see review of systems for further details on the patient's review from today.   Objective:   Physical Exam:  There were no vitals taken for this visit.  Physical Exam Constitutional:      General: He is not in acute distress.    Appearance: He is well-developed.  Musculoskeletal:        General: No deformity.  Neurological:     Mental Status: He is alert and oriented to person, place,  and time.     Motor: No tremor.     Coordination: Coordination normal.     Gait: Gait normal.  Psychiatric:        Attention and Perception: Attention and perception normal.        Mood and Affect: Mood is not anxious or depressed. Affect is not blunt or flat.        Speech: Speech normal.        Behavior: Behavior is not slowed.        Thought Content: Thought content is not paranoid or delusional. Thought content does not include homicidal or suicidal ideation. Thought content does not include suicidal plan.        Cognition and Memory: Cognition normal.        Judgment: Judgment normal.     Comments: Insight intact. No auditory or visual hallucinations.     Lab Review:     Component Value Date/Time   NA 141 10/04/2022 1225   K 3.7 10/04/2022 1225   CL 106 10/04/2022 1225   CO2 25 10/04/2022 1225   GLUCOSE 87 10/04/2022 1225   BUN 18 10/04/2022 1225   CREATININE 0.99 10/04/2022 1225   CREATININE 1.05 01/13/2018 1542   CALCIUM 9.4 10/04/2022 1225   PROT 6.5 10/04/2022 1225   ALBUMIN 4.2 10/04/2022 1225   AST 15 10/04/2022 1225   ALT 18 10/04/2022 1225   ALKPHOS 85 10/04/2022 1225   BILITOT 0.3 10/04/2022 1225   GFRNONAA >60 03/26/2021 1259   GFRAA >60 10/25/2017 0438       Component Value Date/Time   WBC 7.6 10/04/2022 1225   RBC 4.71 10/04/2022 1225   HGB 14.4 10/04/2022 1225   HCT 42.1 10/04/2022  1225   PLT 295.0 10/04/2022 1225   MCV 89.3 10/04/2022 1225   MCH 31.6 03/26/2021 1259   MCHC 34.1 10/04/2022 1225   RDW 14.1 10/04/2022 1225   LYMPHSABS 2.2 10/04/2022 1225   MONOABS 0.6 10/04/2022 1225   EOSABS 0.2 10/04/2022 1225   BASOSABS 0.1 10/04/2022 1225    No results found for: "POCLITH", "LITHIUM"   No results found for: "PHENYTOIN", "PHENOBARB", "VALPROATE", "CBMZ"   .res Assessment: Plan:    Generalized anxiety disorder - Plan: sertraline (ZOLOFT) 100 MG tablet  Major depressive disorder, recurrent, in remission (HCC)  Panic disorder with agoraphobia -- in remission - Plan: sertraline (ZOLOFT) 100 MG tablet  Weight gain due to medication - Plan: naltrexone (DEPADE) 50 MG tablet  Severe recurrent major depression w/psychotic features, mood-congruent ([place]) - Plan: ARIPiprazole (ABILIFY) 5 MG tablet, sertraline (ZOLOFT) 100 MG tablet Erectile 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 We discussed [name] has a history of severe depression and panic disorder and anxiety that caused him to go out on disability in 2019. He relapsed after retirement off meds. Relapsed again off Abilify in August 2022 and resolved on 10 mg again .  Ok now on 5 mg daily However he had weight gain and possibly some EPS. Relapse worse off meds. He's much too inactive in retirement.  Emphasized need to be more active in retirment otherwise health will decline including mental health.  Wife busy in wildlife rehab.   Disc getting b ack into therapy also  Continue sertraline 150 mg daily Continue Abilify 5 mg  bc hunger and rubbing teeth together may be SE More paranoid off Abilify. None  current.  Disc risk. Disc Good RX  No benefit with topiramate so wean it over several weeks or slower if anxious.  Reduce topiramate to 2 twice daily for 2 weeks then 1 twice daily for 2 weeks, then 1  daily for 1 week, then stop it.  Discussed potential metabolic side effects associated with atypical antipsychotics, as well as potential risk for movement side effects. Advised pt to contact office if movement side effects occur.  Disc diet and exercise.    Talk to doctor about [facility] for weight loss.  Oc trial of naltrexone for wt loss . 25 mg BID.  Disc opiate interaction and he's not on any now.  If not helpful then stop it in a month.  Follow-up 6  mos  Lynder Parents, MD, DFAPA  Please see After Visit Summary for patient specific instructions.  Future Appointments  Date Time Provider [place]  01/19/2023  3:00 PM [facility]  03/14/2023 10:20 AM [name], NP [facility]  10/10/2023  9:30 AM Baird Lyons D, MD [facility] None    No orders of the defined types were placed in this encounter.      -------------------------------

## 2022-10-12 DIAGNOSIS — M4726 Other spondylosis with radiculopathy, lumbar region: Secondary | ICD-10-CM | POA: Diagnosis not present

## 2022-10-12 DIAGNOSIS — M5137 Other intervertebral disc degeneration, lumbosacral region: Secondary | ICD-10-CM | POA: Diagnosis not present

## 2022-10-12 DIAGNOSIS — M48061 Spinal stenosis, lumbar region without neurogenic claudication: Secondary | ICD-10-CM | POA: Diagnosis not present

## 2022-10-12 DIAGNOSIS — M47816 Spondylosis without myelopathy or radiculopathy, lumbar region: Secondary | ICD-10-CM | POA: Diagnosis not present

## 2022-10-12 DIAGNOSIS — M4807 Spinal stenosis, lumbosacral region: Secondary | ICD-10-CM | POA: Diagnosis not present

## 2022-10-15 DIAGNOSIS — M48062 Spinal stenosis, lumbar region with neurogenic claudication: Secondary | ICD-10-CM | POA: Diagnosis not present

## 2022-10-18 ENCOUNTER — Telehealth: Payer: Self-pay | Admitting: Family

## 2022-10-18 DIAGNOSIS — Z01812 Encounter for preprocedural laboratory examination: Secondary | ICD-10-CM | POA: Diagnosis not present

## 2022-10-18 DIAGNOSIS — R7989 Other specified abnormal findings of blood chemistry: Secondary | ICD-10-CM | POA: Diagnosis not present

## 2022-10-18 DIAGNOSIS — M48062 Spinal stenosis, lumbar region with neurogenic claudication: Secondary | ICD-10-CM | POA: Diagnosis not present

## 2022-10-18 NOTE — Telephone Encounter (Signed)
EKG report faxed to number provided

## 2022-10-18 NOTE — Telephone Encounter (Signed)
Debbie RN from [facility] called to request EKG results from 10/04/22 to be faxed over to them at [phone] ATTN: Debbie. Pt is having a disc procedure (L3, L4) tomorrow

## 2022-10-19 DIAGNOSIS — M4326 Fusion of spine, lumbar region: Secondary | ICD-10-CM | POA: Diagnosis not present

## 2022-10-19 DIAGNOSIS — M48062 Spinal stenosis, lumbar region with neurogenic claudication: Secondary | ICD-10-CM | POA: Diagnosis not present

## 2022-10-19 DIAGNOSIS — M4726 Other spondylosis with radiculopathy, lumbar region: Secondary | ICD-10-CM | POA: Diagnosis not present

## 2022-10-19 DIAGNOSIS — M960 Pseudarthrosis after fusion or arthrodesis: Secondary | ICD-10-CM | POA: Diagnosis not present

## 2022-10-20 DIAGNOSIS — M48062 Spinal stenosis, lumbar region with neurogenic claudication: Secondary | ICD-10-CM | POA: Diagnosis not present

## 2022-10-20 DIAGNOSIS — M4726 Other spondylosis with radiculopathy, lumbar region: Secondary | ICD-10-CM | POA: Diagnosis not present

## 2022-10-26 ENCOUNTER — Telehealth: Payer: Self-pay | Admitting: Family

## 2022-10-26 ENCOUNTER — Other Ambulatory Visit: Payer: Self-pay

## 2022-10-26 DIAGNOSIS — Z4789 Encounter for other orthopedic aftercare: Secondary | ICD-10-CM | POA: Diagnosis not present

## 2022-10-26 DIAGNOSIS — I10 Essential (primary) hypertension: Secondary | ICD-10-CM | POA: Diagnosis not present

## 2022-10-26 DIAGNOSIS — M48062 Spinal stenosis, lumbar region with neurogenic claudication: Secondary | ICD-10-CM | POA: Diagnosis not present

## 2022-10-26 DIAGNOSIS — F419 Anxiety disorder, unspecified: Secondary | ICD-10-CM | POA: Diagnosis not present

## 2022-10-26 NOTE — Telephone Encounter (Signed)
Caller/Agency: [facility] Number: [phone] Requesting OT/PT/Skilled Nursing/Social Work/Speech Therapy: PT Frequency: 2x2, 1x2

## 2022-10-26 NOTE — Telephone Encounter (Signed)
Patient reports one of the papers from his discharge package says for him to dc allegra and hctz.  Not sure what to do. Surgery was  on 2/27 and hi has been taking this medications.  Please advise  Bp today was 123/80 by [facility] pt

## 2022-10-26 NOTE — Telephone Encounter (Signed)
Patient notified of provider's comments and scheduled to come in Thursday for labs

## 2022-10-26 NOTE — Telephone Encounter (Signed)
Verbal orders given to Juliann Pulse, ok per provider

## 2022-10-26 NOTE — Telephone Encounter (Signed)
I think it is oK for him to continue allegra and hctz, however I would recommend that he complete a bmet in our lab this week, dx hypocalcemia. (Calcium was a bit low [facility]).

## 2022-10-26 NOTE — Telephone Encounter (Signed)
Pt said he had surgery on 10/19/22 and found out something and he has to stop taking Allegra and hydrochlorothiazide (HYDRODIURIL) 25 MG tablet  Patient wants to know what Melissa thinks. Please call to advise.

## 2022-10-27 ENCOUNTER — Telehealth: Payer: Self-pay | Admitting: Internal Medicine

## 2022-10-27 NOTE — Telephone Encounter (Signed)
Called and spoke w/ pt regarding message from [place]. Instructed pt to call uys back in June as a reminder to place new machine order.   Dr.Young please be advised pt is ineligible for a new CPAP at this time but will be approved in June.

## 2022-10-28 ENCOUNTER — Other Ambulatory Visit (INDEPENDENT_AMBULATORY_CARE_PROVIDER_SITE_OTHER): Payer: Medicare Other

## 2022-10-28 DIAGNOSIS — Z01818 Encounter for other preprocedural examination: Secondary | ICD-10-CM

## 2022-10-28 LAB — BASIC METABOLIC PANEL
BUN: 21 mg/dL (ref 6–23)
CO2: 31 mEq/L (ref 19–32)
Calcium: 9.3 mg/dL (ref 8.4–10.5)
Chloride: 101 mEq/L (ref 96–112)
Creatinine, Ser: 0.99 mg/dL (ref 0.40–1.50)
GFR: 78.6 mL/min (ref >60.00)
Glucose, Bld: 101 mg/dL — ABNORMAL HIGH (ref 70–99)
Potassium: 4.2 mEq/L (ref 3.5–5.1)
Sodium: 139 mEq/L (ref 135–145)

## 2022-10-28 LAB — PROTIME-INR
INR: 1 ratio (ref 0.8–1.0)
Prothrombin Time: 11.1 s (ref 9.6–13.1)

## 2022-10-29 DIAGNOSIS — M48062 Spinal stenosis, lumbar region with neurogenic claudication: Secondary | ICD-10-CM | POA: Diagnosis not present

## 2022-10-29 DIAGNOSIS — F419 Anxiety disorder, unspecified: Secondary | ICD-10-CM | POA: Diagnosis not present

## 2022-10-29 DIAGNOSIS — Z4789 Encounter for other orthopedic aftercare: Secondary | ICD-10-CM | POA: Diagnosis not present

## 2022-10-29 DIAGNOSIS — I10 Essential (primary) hypertension: Secondary | ICD-10-CM | POA: Diagnosis not present

## 2022-11-01 DIAGNOSIS — Z4789 Encounter for other orthopedic aftercare: Secondary | ICD-10-CM | POA: Diagnosis not present

## 2022-11-01 DIAGNOSIS — M48062 Spinal stenosis, lumbar region with neurogenic claudication: Secondary | ICD-10-CM | POA: Diagnosis not present

## 2022-11-01 DIAGNOSIS — I10 Essential (primary) hypertension: Secondary | ICD-10-CM | POA: Diagnosis not present

## 2022-11-01 DIAGNOSIS — F419 Anxiety disorder, unspecified: Secondary | ICD-10-CM | POA: Diagnosis not present

## 2022-11-03 DIAGNOSIS — I10 Essential (primary) hypertension: Secondary | ICD-10-CM | POA: Diagnosis not present

## 2022-11-03 DIAGNOSIS — M48062 Spinal stenosis, lumbar region with neurogenic claudication: Secondary | ICD-10-CM | POA: Diagnosis not present

## 2022-11-03 DIAGNOSIS — Z4789 Encounter for other orthopedic aftercare: Secondary | ICD-10-CM | POA: Diagnosis not present

## 2022-11-03 DIAGNOSIS — F419 Anxiety disorder, unspecified: Secondary | ICD-10-CM | POA: Diagnosis not present

## 2022-11-10 DIAGNOSIS — Z4789 Encounter for other orthopedic aftercare: Secondary | ICD-10-CM | POA: Diagnosis not present

## 2022-11-10 DIAGNOSIS — I10 Essential (primary) hypertension: Secondary | ICD-10-CM | POA: Diagnosis not present

## 2022-11-10 DIAGNOSIS — F419 Anxiety disorder, unspecified: Secondary | ICD-10-CM | POA: Diagnosis not present

## 2022-11-10 DIAGNOSIS — M48062 Spinal stenosis, lumbar region with neurogenic claudication: Secondary | ICD-10-CM | POA: Diagnosis not present

## 2022-11-16 ENCOUNTER — Telehealth: Payer: Self-pay | Admitting: Family

## 2022-11-16 ENCOUNTER — Other Ambulatory Visit: Payer: Self-pay

## 2022-11-16 DIAGNOSIS — M4326 Fusion of spine, lumbar region: Secondary | ICD-10-CM | POA: Diagnosis not present

## 2022-11-16 MED ORDER — POTASSIUM CHLORIDE CRYS ER 10 MEQ PO TBCR: EXTENDED_RELEASE_TABLET | ORAL | 1 refills | Status: DC

## 2022-11-16 NOTE — Telephone Encounter (Signed)
Rx sent 

## 2022-11-16 NOTE — Telephone Encounter (Signed)
Prescription Request  11/16/2022  Is this a "Controlled Substance" medicine? No  LOV: 10/04/2022  What is the name of the medication or equipment?  potassium chloride (KLOR-CON M) 10 MEQ tablet   Have you contacted your pharmacy to request a refill? No   Which pharmacy would you like this sent to?  [facility] [address] [place] [address] Phone: [phone] Fax: [phone]     Patient notified that their request is being sent to the clinical staff for review and that they should receive a response within 2 business days.   Please advise at Mobile [phone] (mobile)

## 2022-11-18 DIAGNOSIS — M48062 Spinal stenosis, lumbar region with neurogenic claudication: Secondary | ICD-10-CM | POA: Diagnosis not present

## 2022-11-18 DIAGNOSIS — I10 Essential (primary) hypertension: Secondary | ICD-10-CM | POA: Diagnosis not present

## 2022-11-18 DIAGNOSIS — Z4789 Encounter for other orthopedic aftercare: Secondary | ICD-10-CM | POA: Diagnosis not present

## 2022-11-18 DIAGNOSIS — F419 Anxiety disorder, unspecified: Secondary | ICD-10-CM | POA: Diagnosis not present

## 2022-12-01 ENCOUNTER — Ambulatory Visit (INDEPENDENT_AMBULATORY_CARE_PROVIDER_SITE_OTHER): Payer: Medicare Other | Admitting: Family

## 2022-12-01 VITALS — BP 139/68 | HR 66 | Temp 97.9°F | Resp 16 | Wt 259.0 lb

## 2022-12-01 DIAGNOSIS — M7918 Myalgia, other site: Secondary | ICD-10-CM

## 2022-12-01 NOTE — Assessment & Plan Note (Signed)
Recommended trial of tylenol. He is advised to monitor the area. Let me know if increased pain/swelling occurs or if not improved in 2 weeks. Pt verbalizes understanding.

## 2022-12-01 NOTE — Progress Notes (Signed)
Subjective:   By signing my name below, I, Barrett ShellKennedy C Lynn, attest that this documentation has been prepared under the direction and in the presence of Sandford Craze'Sullivan, Ashonte Angelucci, NP. 12/01/2022   [name], male    DOB: 10/10/1955, 67 y.o.   MRN: [id number]  Chief Complaint  Patient presents with   Mass    Patient reports having a lump on chest, under left breast.    HPI Patient is in today for an office visit.   Left chest lump: He complains of a lump under the left side of his chest. He denies pulling any muscles in the area.   Hiatal hernia: He currently has a hiatal hernia.   Nausea: He complains of having nausea a few days ago with mucus and phlegm associated. Nausea has resolved.   Past Medical History:  Diagnosis Date   Anemia 11/16/2014   Anxiety    Aortic aneurysm    4.1 cm 2019, ascending aorta   Bladder disease    "an unusual bladder disease; don't know what it's called" (06/30/2015)   Bulging lumbar disc    COVID-19 01/09/2021   Depression    GERD (gastroesophageal reflux disease)    History of hiatal hernia    Hyperglycemia 11/16/2014   Hyperlipemia    Hypertension    Joint pain, knee    Migraine    06/30/2015 "maybe 2-3 times/year; not as bad as I used to have them"   OSA (obstructive sleep apnea)     Past Surgical History:  Procedure Laterality Date   ESOPHAGOGASTRODUODENOSCOPY     ESOPHAGOGASTRODUODENOSCOPY (EGD) WITH ESOPHAGEAL DILATION  ~ 2014   JOINT REPLACEMENT     right knee   KNEE ARTHROSCOPY Right 08/23/1993   NASAL SEPTUM SURGERY  ~ 1972   TOTAL KNEE ARTHROPLASTY  10/11/2011   Procedure: TOTAL KNEE ARTHROPLASTY;  Surgeon: Raymon MuttonStephen D Lucey, MD;  Location: [facility] OR;  Service: Orthopedics;  Laterality: Right;   TRANSFORAMINAL LUMBAR INTERBODY FUSION W/ MIS 1 LEVEL Right 03/31/2021   Procedure: Right Lumbar five Sacral one Minimally invasive transforaminal lumbar interbody fusion;  Surgeon: Jadene Pierinistergard, Thomas A, MD;  Location: [facility] OR;   Service: Neurosurgery;  Laterality: Right;   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)  ~2014    Family History  Problem Relation Age of Onset   Stroke Mother 2869       Died of CVA   Hypertension Mother    Anesthesia problems Neg Hx    Hypotension Neg Hx    Malignant hyperthermia Neg Hx    Pseudochol deficiency Neg Hx     Social History   Socioeconomic History   Marital status: Married    Spouse name: Not on file   Number of children: 3   Years of education: Not on file   Highest education level: GED or equivalent  Occupational History   Not on file  Tobacco Use   Smoking status: Former    Packs/day: 1.50    Years: 6.00    Additional pack years: 0.00    Total pack years: 9.00    Types: Cigarettes   Smokeless tobacco: Never   Tobacco comments:    "stopped smoking in ~ 1990"  Substance and Sexual Activity   Alcohol use: No    Alcohol/week: 0.0 standard drinks of alcohol   Drug use: No   Sexual activity: Yes  Other Topics Concern   Not on file  Social History Narrative   3 children  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   Completed 12th grade   Enjoys softball   Social Determinants of Corporate investment banker Strain: Low Risk  (11/30/2022)   Overall Financial Resource Strain (CARDIA)    Difficulty of Paying Living Expenses: Not hard at all  Food Insecurity: No Food Insecurity (11/30/2022)   Hunger Vital Sign    Worried About Running Out of Food in the Last Year: Never true    Ran Out of Food in the Last Year: Never true  Transportation Needs: No Transportation Needs (11/30/2022)   PRAPARE - Administrator, Civil Service (Medical): No    Lack of Transportation (Non-Medical): No  Physical Activity: Unknown (11/30/2022)   Exercise Vital Sign    Days of Exercise per Week: Patient declined    Minutes of Exercise per Session: 0 min  Stress: No Stress Concern Present (11/30/2022)   [employer] of  Occupational Health - Occupational Stress Questionnaire    Feeling of Stress : Not at all  Social Connections: Socially Integrated (11/30/2022)   Social Connection and Isolation Panel [NHANES]    Frequency of Communication with Friends and Family: More than three times a week    Frequency of Social Gatherings with Friends and Family: Twice a week    Attends Religious Services: More than 4 times per year    Active Member of [employer] or Organizations: No    Attends Engineer, structural: More than 4 times per year    Marital Status: Married  Catering manager Violence: Not At Risk (01/07/2022)   Humiliation, Afraid, Rape, and Kick questionnaire    Fear of Current or Ex-Partner: No    Emotionally Abused: No    Physically Abused: No    Sexually Abused: No    Outpatient Medications Prior to Visit  Medication Sig Dispense Refill   amLODipine-benazepril (LOTREL) 10-40 MG capsule Take 1 capsule by mouth daily. 90 capsule 1   ARIPiprazole (ABILIFY) 5 MG tablet Take 1 tablet (5 mg total) by mouth daily. 90 tablet 1   ascorbic acid (VITAMIN C) 500 MG tablet Take by mouth.     b complex vitamins tablet Take 1 tablet by mouth daily.     COVID-19 mRNA vaccine 2023-2024 (COMIRNATY) syringe Inject into the muscle. 0.3 mL 0   ferrous sulfate 325 (65 FE) MG tablet Take 1 tablet (325 mg total) by mouth 2 (two) times daily with a meal. 60 tablet 0   fexofenadine (ALLEGRA) 180 MG tablet Take 1 tablet (180 mg total) by mouth daily. 30 tablet 0   furosemide (LASIX) 20 MG tablet Take 1 tablet by mouth once daily 90 tablet 1   hydrochlorothiazide (HYDRODIURIL) 25 MG tablet TAKE 1 TABLET(25 MG) BY MOUTH DAILY 90 tablet 1   methocarbamol (ROBAXIN) 500 MG tablet Take by mouth.     metoprolol succinate (TOPROL-XL) 50 MG 24 hr tablet Take one tablet daily (50MG ) with or immediately following a meal. 90 tablet 1   Multiple Vitamin (MULTIVITAMIN WITH MINERALS) TABS tablet Take 1 tablet by mouth daily.      naltrexone (DEPADE) 50 MG tablet Take 0.5 tablets (25 mg total) by mouth 2 (two) times daily. 30 tablet 0   omeprazole (PRILOSEC) 20 MG capsule Take 1 capsule (20 mg total) by mouth daily. 30 capsule 3   potassium chloride (KLOR-CON M) 10 MEQ tablet Take 2 tablets by mouth in the AM and 1 tablet by mouth in  the PM 270 tablet 1   sertraline (ZOLOFT) 100 MG tablet Take 1.5 tablets (150 mg total) by mouth daily. 135 tablet 1   No facility-administered medications prior to visit.    No Known Allergies  Review of Systems  Constitutional:        Bump under left side of chest Hiatal hernia        Objective:    Physical Exam Constitutional:      General: He is not in acute distress.    Appearance: Normal appearance.  HENT:     Head: Normocephalic and atraumatic.     Right Ear: External ear normal.     Left Ear: External ear normal.  Eyes:     Extraocular Movements: Extraocular movements intact.     Pupils: Pupils are equal, round, and reactive to light.  Cardiovascular:     Rate and Rhythm: Normal rate and regular rhythm.     Heart sounds: Normal heart sounds. No murmur heard.    No gallop.  Pulmonary:     Effort: Pulmonary effort is normal. No respiratory distress.     Breath sounds: Normal breath sounds. No wheezing or rales.  Musculoskeletal:     Comments: + tenderness to palpation overlying left lower anterior rib cage.  No discrete mass is noted, no redness, no fluctuance  Skin:    General: Skin is warm.  Neurological:     Mental Status: He is alert and oriented to person, place, and time.  Psychiatric:        Judgment: Judgment normal.     BP 139/68 (BP Location: Right Arm, Patient Position: Sitting, Cuff Size: Large)   Pulse 66   Temp 97.9 F (36.6 C) (Oral)   Resp 16   Wt 259 lb (117.5 kg)   SpO2 98%   BMI 41.80 kg/m  Wt Readings from Last 3 Encounters:  12/01/22 259 lb (117.5 kg)  10/07/22 258 lb 6.4 oz (117.2 kg)  10/04/22 256 lb (116.1 kg)        Assessment & Plan:  Musculoskeletal pain Assessment & Plan: Recommended trial of tylenol. He is advised to monitor the area. Let me know if increased pain/swelling occurs or if not improved in 2 weeks. Pt verbalizes understanding.      I, Lemont Fillers, NP, personally preformed the services described in this documentation.  All medical record entries made by the scribe were at my direction and in my presence.  I have reviewed the chart and discharge instructions (if applicable) and agree that the record reflects my personal performance and is accurate and complete. 12/01/2022  Lemont Fillers, NP  [facility] as a scribe for Lemont Fillers, NP.,have documented all relevant documentation on the behalf of Lemont Fillers, NP,as directed by  Lemont Fillers, NP while in the presence of Lemont Fillers, NP.

## 2022-12-16 NOTE — Progress Notes (Signed)
Subjective:   By signing my name below, I, Isabelle Course, attest that this documentation has been prepared under the direction and in the presence of Lemont Fillers, NP 12/17/22   [name], male    DOB: 1956/01/17, 67 y.o.   MRN: [id number]  Chief Complaint  Patient presents with   Follow-up    Pt seen 12/01/22 for Musculoskeletal pain- advised to monitor the area, let us me know if increased pain/swelling occurs or if not improved in 2 weeks. Pt has noticed no improvements.     HPI Patient is in today for a follow up.   Musculoskeletal pain: He complains of bilateral upper rib pain, worse on his left side. He was last seen on 4/10 and was advised to monitor the area for increased pain/swelling. He has noticed no improvement. He has taken Tylenol, which does not seem to relieve his pain.  Past Medical History:  Diagnosis Date   Anemia 11/16/2014   Anxiety    Aortic aneurysm (HCC)    4.1 cm 2019, ascending aorta   Bladder disease    "an unusual bladder disease; don't know what it's called" (06/30/2015)   Bulging lumbar disc    COVID-19 01/09/2021   Depression    GERD (gastroesophageal reflux disease)    History of hiatal hernia    Hyperglycemia 11/16/2014   Hyperlipemia    Hypertension    Joint pain, knee    Migraine    06/30/2015 "maybe 2-3 times/year; not as bad as I used to have them"   OSA (obstructive sleep apnea)     Past Surgical History:  Procedure Laterality Date   ESOPHAGOGASTRODUODENOSCOPY     ESOPHAGOGASTRODUODENOSCOPY (EGD) WITH ESOPHAGEAL DILATION  ~ 2014   JOINT REPLACEMENT     right knee   KNEE ARTHROSCOPY Right 08/23/1993   NASAL SEPTUM SURGERY  ~ 1972   TOTAL KNEE ARTHROPLASTY  10/11/2011   Procedure: TOTAL KNEE ARTHROPLASTY;  Surgeon: Raymon Mutton, MD;  Location: [facility] OR;  Service: Orthopedics;  Laterality: Right;   TRANSFORAMINAL LUMBAR INTERBODY FUSION W/ MIS 1 LEVEL Right 03/31/2021   Procedure: Right Lumbar five Sacral one  Minimally invasive transforaminal lumbar interbody fusion;  Surgeon: Jadene Pierini, MD;  Location: [facility] OR;  Service: Neurosurgery;  Laterality: Right;   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)  ~2014    Family History  Problem Relation Age of Onset   Stroke Mother 31       Died of CVA   Hypertension Mother    Anesthesia problems Neg Hx    Hypotension Neg Hx    Malignant hyperthermia Neg Hx    Pseudochol deficiency Neg Hx     Social History   Socioeconomic History   Marital status: Married    Spouse name: Not on file   Number of children: 3   Years of education: Not on file   Highest education level: GED or equivalent  Occupational History   Not on file  Tobacco Use   Smoking status: Former    Packs/day: 1.50    Years: 6.00    Additional pack years: 0.00    Total pack years: 9.00    Types: Cigarettes   Smokeless tobacco: Never   Tobacco comments:    "stopped smoking in ~ 1990"  Substance and Sexual Activity   Alcohol use: No    Alcohol/week: 0.0 standard drinks of alcohol   Drug use: No   Sexual activity: Yes  Other Topics Concern   Not on file  Social History Narrative   3 children    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   Completed 12th grade   Enjoys softball   Social Determinants of Corporate investment banker Strain: Low Risk  (11/30/2022)   Overall Financial Resource Strain (CARDIA)    Difficulty of Paying Living Expenses: Not hard at all  Food Insecurity: No Food Insecurity (11/30/2022)   Hunger Vital Sign    Worried About Running Out of Food in the Last Year: Never true    Ran Out of Food in the Last Year: Never true  Transportation Needs: No Transportation Needs (11/30/2022)   PRAPARE - Administrator, Civil Service (Medical): No    Lack of Transportation (Non-Medical): No  Physical Activity: Unknown (11/30/2022)   Exercise Vital Sign    Days of Exercise per Week: Patient declined     Minutes of Exercise per Session: 0 min  Stress: No Stress Concern Present (11/30/2022)   [employer] of Occupational Health - Occupational Stress Questionnaire    Feeling of Stress : Not at all  Social Connections: Socially Integrated (11/30/2022)   Social Connection and Isolation Panel [NHANES]    Frequency of Communication with Friends and Family: More than three times a week    Frequency of Social Gatherings with Friends and Family: Twice a week    Attends Religious Services: More than 4 times per year    Active Member of [employer] or Organizations: No    Attends Engineer, structural: More than 4 times per year    Marital Status: Married  Catering manager Violence: Not At Risk (01/07/2022)   Humiliation, Afraid, Rape, and Kick questionnaire    Fear of Current or Ex-Partner: No    Emotionally Abused: No    Physically Abused: No    Sexually Abused: No    Outpatient Medications Prior to Visit  Medication Sig Dispense Refill   amLODipine-benazepril (LOTREL) 10-40 MG capsule Take 1 capsule by mouth daily. 90 capsule 1   ARIPiprazole (ABILIFY) 5 MG tablet Take 1 tablet (5 mg total) by mouth daily. 90 tablet 1   ascorbic acid (VITAMIN C) 500 MG tablet Take by mouth.     b complex vitamins tablet Take 1 tablet by mouth daily.     ferrous sulfate 325 (65 FE) MG tablet Take 1 tablet (325 mg total) by mouth 2 (two) times daily with a meal. 60 tablet 0   fexofenadine (ALLEGRA) 180 MG tablet Take 1 tablet (180 mg total) by mouth daily. 30 tablet 0   furosemide (LASIX) 20 MG tablet Take 1 tablet by mouth once daily 90 tablet 1   hydrochlorothiazide (HYDRODIURIL) 25 MG tablet TAKE 1 TABLET(25 MG) BY MOUTH DAILY 90 tablet 1   metoprolol succinate (TOPROL-XL) 50 MG 24 hr tablet Take one tablet daily (50MG ) with or immediately following a meal. 90 tablet 1   Multiple Vitamin (MULTIVITAMIN WITH MINERALS) TABS tablet Take 1 tablet by mouth daily.     omeprazole (PRILOSEC) 20 MG capsule Take 1  capsule (20 mg total) by mouth daily. 30 capsule 3   potassium chloride (KLOR-CON M) 10 MEQ tablet Take 2 tablets by mouth in the AM and 1 tablet by mouth in the PM 270 tablet 1   sertraline (ZOLOFT) 100 MG tablet Take 1.5 tablets (150 mg total) by mouth daily. 135 tablet 1   COVID-19 mRNA vaccine 2023-2024 (  COMIRNATY) syringe Inject into the muscle. (Patient not taking: Reported on 12/17/2022) 0.3 mL 0   methocarbamol (ROBAXIN) 500 MG tablet Take by mouth. (Patient not taking: Reported on 12/17/2022)     naltrexone (DEPADE) 50 MG tablet Take 0.5 tablets (25 mg total) by mouth 2 (two) times daily. (Patient not taking: Reported on 12/17/2022) 30 tablet 0   No facility-administered medications prior to visit.    No Known Allergies  Review of Systems  Musculoskeletal:  Positive for myalgias (bilateral lower chest pain - worse on left side).       Objective:    Physical Exam Constitutional:      General: He is not in acute distress.    Appearance: He is well-developed.  HENT:     Head: Normocephalic and atraumatic.  Cardiovascular:     Rate and Rhythm: Normal rate and regular rhythm.     Heart sounds: No murmur heard. Pulmonary:     Effort: Pulmonary effort is normal. No respiratory distress.     Breath sounds: Normal breath sounds. No wheezing or rales.  Chest:     Chest wall: No mass.     Comments: Some gynecomastia noted + tenderness to palpation overlying bilateral anterior ribs 9 and 10. No mass or swelling noted Skin:    General: Skin is warm and dry.  Neurological:     Mental Status: He is alert and oriented to person, place, and time.  Psychiatric:        Behavior: Behavior normal.        Thought Content: Thought content normal.     BP 115/63 (BP Location: Left Arm, Patient Position: Sitting, Cuff Size: Large)   Pulse 72   Temp 98 F (36.7 C) (Oral)   Resp 18   Ht 5\' 6"  (1.676 m)   Wt 264 lb (119.7 kg)   SpO2 95%   BMI 42.61 kg/m  Wt Readings from Last 3  Encounters:  12/17/22 264 lb (119.7 kg)  12/01/22 259 lb (117.5 kg)  10/07/22 258 lb 6.4 oz (117.2 kg)       Assessment & Plan:  Rib pain Assessment & Plan: Tenderness to palpation bilaterally, advised pt most likely musculoskeletal pain. Will obtain rib XR to rule out fracture or sclerotic lesions, but expect it will look OK. Advised pt ok to use lidocaine patches and tylenol.   Orders: -     DG Ribs Bilateral; Future     I,Rachel Rivera,acting as a scribe for Lemont Fillers, NP.,have documented all relevant documentation on the behalf of Lemont Fillers, NP,as directed by  Lemont Fillers, NP while in the presence of Lemont Fillers, NP.   I, Lemont Fillers, NP, personally preformed the services described in this documentation.  All medical record entries made by the scribe were at my direction and in my presence.  I have reviewed the chart and discharge instructions (if applicable) and agree that the record reflects my personal performance and is accurate and complete. 12/17/22   Lemont Fillers, NP

## 2022-12-17 ENCOUNTER — Ambulatory Visit (INDEPENDENT_AMBULATORY_CARE_PROVIDER_SITE_OTHER): Payer: Medicare Other | Admitting: Family

## 2022-12-17 ENCOUNTER — Ambulatory Visit (HOSPITAL_BASED_OUTPATIENT_CLINIC_OR_DEPARTMENT_OTHER)
Admission: RE | Admit: 2022-12-17 | Discharge: 2022-12-17 | Disposition: A | Discharge status: Home / Self Care | Payer: Medicare Other | Source: Ambulatory Visit | Attending: Family | Admitting: Family

## 2022-12-17 VITALS — BP 115/63 | HR 72 | Temp 98.0°F | Resp 18 | Ht 66.0 in | Wt 264.0 lb

## 2022-12-17 DIAGNOSIS — R0781 Pleurodynia: Secondary | ICD-10-CM

## 2022-12-17 DIAGNOSIS — R0602 Shortness of breath: Secondary | ICD-10-CM | POA: Diagnosis not present

## 2022-12-17 NOTE — Assessment & Plan Note (Signed)
Tenderness to palpation bilaterally, advised pt most likely musculoskeletal pain. Will obtain rib XR to rule out fracture or sclerotic lesions, but expect it will look OK. Advised pt ok to use lidocaine patches and tylenol.

## 2022-12-24 ENCOUNTER — Telehealth: Payer: Self-pay | Admitting: Family

## 2022-12-24 NOTE — Telephone Encounter (Signed)
Pt called to see if his provider is going to order CT for the bump on his chest. Please call to advise

## 2022-12-24 NOTE — Telephone Encounter (Signed)
Patient notified of provider's comments and advised she can examine this areas of concern again in July on his next appointment

## 2022-12-24 NOTE — Telephone Encounter (Signed)
I did not find anything but normal fatty tissue when I examined him. I do not think a CT is necessary at this time.

## 2022-12-30 ENCOUNTER — Other Ambulatory Visit: Payer: Self-pay | Admitting: Family

## 2023-01-07 ENCOUNTER — Other Ambulatory Visit: Payer: Self-pay

## 2023-01-07 ENCOUNTER — Telehealth: Payer: Self-pay | Admitting: Family

## 2023-01-07 MED ORDER — HYDROCHLOROTHIAZIDE 25 MG PO TABS: ORAL_TABLET | ORAL | 1 refills | Status: DC

## 2023-01-07 MED ORDER — METOPROLOL SUCCINATE ER 50 MG PO TB24: ORAL_TABLET | ORAL | 1 refills | Status: DC

## 2023-01-07 NOTE — Telephone Encounter (Signed)
Prescription Request  01/07/2023  Is this a "Controlled Substance" medicine? No  LOV: 12/17/2022  What is the name of the medication or equipment?  hydrochlorothiazide (HYDRODIURIL) 25 MG tablet   metoprolol succinate (TOPROL-XL) 50 MG 24 hr tablet   Have you contacted your pharmacy to request a refill? NO   Which pharmacy would you like this sent to?  [id number] [place], [place] - [address] [place] [address] Phone: [phone] Fax: [phone]   Patient notified that their request is being sent to the clinical staff for review and that they should receive a response within 2 business days.   Please advise at Mobile [phone] (mobile)

## 2023-01-19 ENCOUNTER — Ambulatory Visit (INDEPENDENT_AMBULATORY_CARE_PROVIDER_SITE_OTHER): Payer: Medicare Other | Admitting: *Deleted

## 2023-01-19 DIAGNOSIS — Z0000 Encounter for general adult medical examination without abnormal findings: Secondary | ICD-10-CM | POA: Diagnosis not present

## 2023-01-19 NOTE — Progress Notes (Signed)
Subjective:  Pt completed ADLs, Fall risk, and SDOH during e-check in on 01/12/23.  Answers verified with pt.    Jeremy [name] is a 67 y.o. male who presents for Medicare Annual/Subsequent preventive examination.  I connected with  Jeremy [name] on 01/19/23 by a audio enabled telemedicine application and verified that I am speaking with the correct person using two identifiers.  Patient Location: Home  Provider Location: Office/Clinic  I discussed the limitations of evaluation and management by telemedicine. The patient expressed understanding and agreed to proceed.   Review of Systems     Cardiac Risk Factors include: advanced age (>22men, >36 women);male gender;dyslipidemia;hypertension     Objective:    There were no vitals filed for this visit. There is no height or weight on file to calculate BMI.     01/19/2023    3:03 PM 01/07/2022    2:46 PM 03/26/2021   11:02 AM 04/12/2018    9:32 AM 11/28/2017    8:59 PM 10/22/2017   12:00 AM 10/21/2017    1:47 PM  Advanced Directives  Does Patient Have a Medical Advance Directive? No No No No No No No  Would patient like information on creating a medical advance directive? No - Patient declined No - Patient declined  No - Patient declined No - Patient declined No - Patient declined No - Patient declined    Current Medications (verified) Outpatient Encounter Medications as of 01/19/2023  Medication Sig   amLODipine-benazepril (LOTREL) 10-40 MG capsule Take 1 capsule by mouth once daily   ARIPiprazole (ABILIFY) 5 MG tablet Take 1 tablet (5 mg total) by mouth daily.   ascorbic acid (VITAMIN C) 500 MG tablet Take by mouth.   b complex vitamins tablet Take 1 tablet by mouth daily.   ferrous sulfate 325 (65 FE) MG tablet Take 1 tablet (325 mg total) by mouth 2 (two) times daily with a meal.   fexofenadine (ALLEGRA) 180 MG tablet Take 1 tablet (180 mg total) by mouth daily.   furosemide (LASIX) 20 MG tablet Take 1 tablet by mouth once  daily   hydrochlorothiazide (HYDRODIURIL) 25 MG tablet TAKE 1 TABLET(25 MG) BY MOUTH DAILY   metoprolol succinate (TOPROL-XL) 50 MG 24 hr tablet Take one tablet daily (50MG ) with or immediately following a meal.   Multiple Vitamin (MULTIVITAMIN WITH MINERALS) TABS tablet Take 1 tablet by mouth daily.   omeprazole (PRILOSEC) 20 MG capsule Take 1 capsule (20 mg total) by mouth daily.   potassium chloride (KLOR-CON M) 10 MEQ tablet Take 2 tablets by mouth in the AM and 1 tablet by mouth in the PM   sertraline (ZOLOFT) 100 MG tablet Take 1.5 tablets (150 mg total) by mouth daily.   No facility-administered encounter medications on file as of 01/19/2023.    Allergies (verified) Patient has no known allergies.   History: Past Medical History:  Diagnosis Date   Anemia 11/16/2014   Anxiety    Aortic aneurysm (HCC)    4.1 cm 2019, ascending aorta   Arthritis    Bladder disease    "an unusual bladder disease; don't know what it's called" (06/30/2015)   Bulging lumbar disc    Cataract    COVID-19 01/09/2021   Depression    GERD (gastroesophageal reflux disease)    History of hiatal hernia    Hyperglycemia 11/16/2014   Hyperlipemia    Hypertension    Joint pain, knee    Migraine    06/30/2015 "maybe 2-3  times/year; not as bad as I used to have them"   OSA (obstructive sleep apnea)    Sleep apnea    Past Surgical History:  Procedure Laterality Date   ESOPHAGOGASTRODUODENOSCOPY     ESOPHAGOGASTRODUODENOSCOPY (EGD) WITH ESOPHAGEAL DILATION  ~ 2014   JOINT REPLACEMENT     right knee   KNEE ARTHROSCOPY Right 08/23/1993   NASAL SEPTUM SURGERY  ~ 1972   SPINE SURGERY     TOTAL KNEE ARTHROPLASTY  10/11/2011   Procedure: TOTAL KNEE ARTHROPLASTY;  Surgeon: Raymon Mutton, MD;  Location: [facility] OR;  Service: Orthopedics;  Laterality: Right;   TRANSFORAMINAL LUMBAR INTERBODY FUSION W/ MIS 1 LEVEL Right 03/31/2021   Procedure: Right Lumbar five Sacral one Minimally invasive transforaminal lumbar  interbody fusion;  Surgeon: Jadene Pierini, MD;  Location: [facility] OR;  Service: Neurosurgery;  Laterality: Right;   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)  ~2014   Family History  Problem Relation Age of Onset   Stroke Mother 65       Died of CVA   Hypertension Mother    Anesthesia problems Neg Hx    Hypotension Neg Hx    Malignant hyperthermia Neg Hx    Pseudochol deficiency Neg Hx    Social History   Socioeconomic History   Marital status: Married    Spouse name: Not on file   Number of children: 3   Years of education: Not on file   Highest education level: GED or equivalent  Occupational History   Not on file  Tobacco Use   Smoking status: Former    Packs/day: 1.50    Years: 6.00    Additional pack years: 0.00    Total pack years: 9.00    Types: Cigarettes    Quit date: 11/30/1976    Years since quitting: 46.1   Smokeless tobacco: Never   Tobacco comments:    "stopped smoking in ~ 1990"  Substance and Sexual Activity   Alcohol use: No    Alcohol/week: 0.0 standard drinks of alcohol   Drug use: No   Sexual activity: Yes  Other Topics Concern   Not on file  Social History Narrative   3 children    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   Completed 12th grade   Enjoys softball   Social Determinants of Health   Financial Resource Strain: Low Risk  (01/12/2023)   Overall Financial Resource Strain (CARDIA)    Difficulty of Paying Living Expenses: Not hard at all  Food Insecurity: No Food Insecurity (01/12/2023)   Hunger Vital Sign    Worried About Running Out of Food in the Last Year: Never true    Ran Out of Food in the Last Year: Never true  Transportation Needs: No Transportation Needs (01/12/2023)   PRAPARE - Administrator, Civil Service (Medical): No    Lack of Transportation (Non-Medical): No  Physical Activity: Inactive (01/12/2023)   Exercise Vital Sign    Days of Exercise per Week: 0 days     Minutes of Exercise per Session: 0 min  Stress: No Stress Concern Present (01/12/2023)   [employer] of Occupational Health - Occupational Stress Questionnaire    Feeling of Stress : Not at all  Social Connections: Socially Integrated (01/12/2023)   Social Connection and Isolation Panel [NHANES]    Frequency of Communication with Friends and Family: More than three times a week    Frequency of  Social Gatherings with Friends and Family: More than three times a week    Attends Religious Services: More than 4 times per year    Active Member of [employer] or Organizations: Yes    Attends Engineer, structural: More than 4 times per year    Marital Status: Married    Tobacco Counseling Counseling given: Not Answered Tobacco comments: "stopped smoking in ~ 1990"   Clinical Intake:  Pre-visit preparation completed: Yes  Pain : No/denies pain  Nutritional Risks: None Diabetes: No  How often do you need to have someone help you when you read instructions, pamphlets, or other written materials from your doctor or pharmacy?: 1 - Never   Activities of Daily Living    01/12/2023    2:10 PM  In your present state of health, do you have any difficulty performing the following activities:  Hearing? 0  Vision? 0  Difficulty concentrating or making decisions? 0  Walking or climbing stairs? 0  Dressing or bathing? 0  Doing errands, shopping? 0  Preparing Food and eating ? N  Using the Toilet? N  In the past six months, have you accidently leaked urine? N  Do you have problems with loss of bowel control? N  Managing your Medications? N  Managing your Finances? N  Housekeeping or managing your Housekeeping? N    Patient Care Team: Sandford Craze, NP as PCP - General (Internal Medicine) Rollene Rotunda, MD as PCP - Cardiology (Cardiology)  Indicate any recent Medical Services you may have received from other than [facility] providers in the past year (date may be  approximate).     Assessment:   This is a routine wellness examination for Jeremy [name].  Hearing/Vision screen No results found.  Dietary issues and exercise activities discussed: Current Exercise Habits: The patient does not participate in regular exercise at present, Exercise limited by: orthopedic condition(s) (back surgery in Feb 2024)   Goals Addressed   None    Depression Screen    01/19/2023    3:05 PM 09/13/2022    9:55 AM 07/26/2022   12:17 PM 03/15/2022   10:09 AM 01/07/2022    2:44 PM 12/29/2020   10:52 AM 12/22/2020    9:48 AM  PHQ 2/9 Scores  PHQ - 2 Score 0 0 0 0 0 2 0  PHQ- 9 Score  0 0 0  2 0    Fall Risk    01/12/2023    2:10 PM 09/13/2022    9:56 AM 01/07/2022    2:46 PM 12/29/2020   10:52 AM 12/22/2020    9:48 AM  Fall Risk   Falls in the past year? 1 0 0 1 0  Number falls in past yr: 1 0 0 0 0  Injury with Fall? 0 0 0 0 0  Risk for fall due to : No Fall Risks No Fall Risks Impaired balance/gait No Fall Risks No Fall Risks  Follow up Falls evaluation completed Falls evaluation completed Falls prevention discussed      FALL RISK PREVENTION PERTAINING TO THE HOME:  Any stairs in or around the home? Yes  If so, are there any without handrails? Yes  Home free of loose throw rugs in walkways, pet beds, electrical cords, etc? Yes  Adequate lighting in your home to reduce risk of falls? Yes   ASSISTIVE DEVICES UTILIZED TO PREVENT FALLS:  Life alert? No  Use of a cane, walker or w/c? No  Grab bars in the bathroom? No  Shower chair or bench in shower? Yes  Elevated toilet seat or a handicapped toilet? No   TIMED UP AND GO:  Was the test performed?  No, audio visit .    Cognitive Function:        01/19/2023    3:10 PM 01/07/2022    2:48 PM  6CIT Screen  What Year? 0 points 0 points  What month? 0 points 0 points  What time? 0 points 0 points  Count back from 20 0 points 0 points  Months in reverse 0 points 0 points  Repeat phrase 0 points 0 points   Total Score 0 points 0 points    Immunizations Immunization History  Administered Date(s) Administered   COVID-19, mRNA, vaccine(Comirnaty)12 years and older 07/26/2022   Fluad Quad(high Dose 65+) 05/22/2021, 04/26/2022   Influenza,inj,Quad PF,6+ Mos 05/03/2016, 05/16/2017, 05/02/2018, 05/24/2019, 06/18/2020   Influenza-Unspecified 05/23/2014, 05/07/2015, 05/19/2015   Moderna Covid-19 Vaccine Bivalent Booster 56yrs & up 05/22/2021   Moderna SARS-COV2 Booster Vaccination 08/08/2020   Moderna Sars-Covid-2 Vaccination 09/24/2019, 10/22/2019   Pneumococcal Polysaccharide-23 03/17/2020   Tdap 11/11/2014   Zoster Recombinat (Shingrix) 07/19/2018, 10/20/2018    TDAP status: Up to date  Flu Vaccine status: Up to date  Pneumococcal vaccine status: Due, Education has been provided regarding the importance of this vaccine. Advised may receive this vaccine at local pharmacy or Health Dept. Aware to provide a copy of the vaccination record if obtained from local pharmacy or Health Dept. Verbalized acceptance and understanding.  Covid-19 vaccine status: Information provided on how to obtain vaccines.   Qualifies for Shingles Vaccine? Yes   Zostavax completed No   Shingrix Completed?: Yes  Screening Tests Health Maintenance  Topic Date Due   Colonoscopy  09/23/2018   Pneumonia Vaccine 19+ Years old (2 of 2 - PCV) 03/17/2021   Medicare Annual Wellness (AWV)  01/08/2023   INFLUENZA VACCINE  03/24/2023   DTaP/Tdap/Td (2 - Td or Tdap) 11/10/2024   COVID-19 Vaccine  Completed   Hepatitis C Screening  Completed   Zoster Vaccines- Shingrix  Completed   HPV VACCINES  Aged Out    Health Maintenance  Health Maintenance Due  Topic Date Due   Colonoscopy  09/23/2018   Pneumonia Vaccine 67+ Years old (2 of 2 - PCV) 03/17/2021   Medicare Annual Wellness (AWV)  01/08/2023    Colorectal cancer screening: Type of screening: Colonoscopy. Completed 02/22/22. Repeat every 10 years  Lung Cancer  Screening: (Low Dose CT Chest recommended if Age 28-80 years, 30 pack-year currently smoking OR have quit w/in 15years.) does not qualify.    Additional Screening:  Hepatitis C Screening: does qualify; Completed 01/12/16  Vision Screening: Recommended annual ophthalmology exams for early detection of glaucoma and other disorders of the eye. Is the patient up to date with their annual eye exam?  Yes  Who is the provider or what is the name of the office in which the patient attends annual eye exams? [facility] If pt is not established with a provider, would they like to be referred to a provider to establish care? No .   Dental Screening: Recommended annual dental exams for proper oral hygiene  Community Resource Referral / Chronic Care Management: CRR required this visit?  No   CCM required this visit?  No      Plan:     I have personally reviewed and noted the following in the patient's chart:   Medical and social history Use of alcohol,  tobacco or illicit drugs  Current medications and supplements including opioid prescriptions. Patient is not currently taking opioid prescriptions. Functional ability and status Nutritional status Physical activity Advanced directives List of other physicians Hospitalizations, surgeries, and ER visits in previous 12 months Vitals Screenings to include cognitive, depression, and falls Referrals and appointments  In addition, I have reviewed and discussed with patient certain preventive protocols, quality metrics, and best practice recommendations. A written personalized care plan for preventive services as well as general preventive health recommendations were provided to patient.   Due to this being a telephonic visit, the after visit summary with patients personalized plan was offered to patient via mail or my-chart. Patient would like to access on my-chart.  Donne Anon, [place]   01/19/2023   Nurse Notes: None

## 2023-01-19 NOTE — Patient Instructions (Signed)
Jeremy [name] , Thank you for taking time to come for your Medicare Wellness Visit. I appreciate your ongoing commitment to your health goals. Please review the following plan we discussed and let me know if I can assist you in the future.     This is a list of the screening recommended for you and due dates:  Health Maintenance  Topic Date Due   Colon Cancer Screening  09/23/2018   Pneumonia Vaccine (2 of 2 - PCV) 03/17/2021   Flu Shot  03/24/2023   Medicare Annual Wellness Visit  01/19/2024   DTaP/Tdap/Td vaccine (2 - Td or Tdap) 11/10/2024   COVID-19 Vaccine  Completed   Hepatitis C Screening  Completed   Zoster (Shingles) Vaccine  Completed   HPV Vaccine  Aged Out    Next appointment: Follow up in one year for your annual wellness visit.   Preventive Care 42 Years and Older, Male Preventive care refers to lifestyle choices and visits with your health care provider that can promote health and wellness. What does preventive care include? A yearly physical exam. This is also called an annual well check. Dental exams once or twice a year. Routine eye exams. Ask your health care provider how often you should have your eyes checked. Personal lifestyle choices, including: Daily care of your teeth and gums. Regular physical activity. Eating a healthy diet. Avoiding tobacco and drug use. Limiting alcohol use. Practicing safe sex. Taking low doses of aspirin every day. Taking vitamin and mineral supplements as recommended by your health care provider. What happens during an annual well check? The services and screenings done by your health care provider during your annual well check will depend on your age, overall health, lifestyle risk factors, and family history of disease. Counseling  Your health care provider may ask you questions about your: Alcohol use. Tobacco use. Drug use. Emotional well-being. Home and relationship well-being. Sexual activity. Eating habits. History  of falls. Memory and ability to understand (cognition). Work and work Astronomer. Screening  You may have the following tests or measurements: Height, weight, and BMI. Blood pressure. Lipid and cholesterol levels. These may be checked every 5 years, or more frequently if you are over 70 years old. Skin check. Lung cancer screening. You may have this screening every year starting at age 81 if you have a 30-pack-year history of smoking and currently smoke or have quit within the past 15 years. Fecal occult blood test (FOBT) of the stool. You may have this test every year starting at age 24. Flexible sigmoidoscopy or colonoscopy. You may have a sigmoidoscopy every 5 years or a colonoscopy every 10 years starting at age 74. Prostate cancer screening. Recommendations will vary depending on your family history and other risks. Hepatitis C blood test. Hepatitis B blood test. Sexually transmitted disease (STD) testing. Diabetes screening. This is done by checking your blood sugar (glucose) after you have not eaten for a while (fasting). You may have this done every 1-3 years. Abdominal aortic aneurysm (AAA) screening. You may need this if you are a current or former smoker. Osteoporosis. You may be screened starting at age 45 if you are at high risk. Talk with your health care provider about your test results, treatment options, and if necessary, the need for more tests. Vaccines  Your health care provider may recommend certain vaccines, such as: Influenza vaccine. This is recommended every year. Tetanus, diphtheria, and acellular pertussis (Tdap, Td) vaccine. You may need a Td booster every 10  years. Zoster vaccine. You may need this after age 85. Pneumococcal 13-valent conjugate (PCV13) vaccine. One dose is recommended after age 50. Pneumococcal polysaccharide (PPSV23) vaccine. One dose is recommended after age 10. Talk to your health care provider about which screenings and vaccines you need  and how often you need them. This information is not intended to replace advice given to you by your health care provider. Make sure you discuss any questions you have with your health care provider. Document Released: 09/05/2015 Document Revised: 04/28/2016 Document Reviewed: 06/10/2015 Elsevier Interactive Patient Education  2017 [employer].  Fall Prevention in the Home Falls can cause injuries. They can happen to people of all ages. There are many things you can do to make your home safe and to help prevent falls. What can I do on the outside of my home? Regularly fix the edges of walkways and driveways and fix any cracks. Remove anything that might make you trip as you walk through a door, such as a raised step or threshold. Trim any bushes or trees on the path to your home. Use bright outdoor lighting. Clear any walking paths of anything that might make someone trip, such as rocks or tools. Regularly check to see if handrails are loose or broken. Make sure that both sides of any steps have handrails. Any raised decks and porches should have guardrails on the edges. Have any leaves, snow, or ice cleared regularly. Use sand or salt on walking paths during winter. Clean up any spills in your garage right away. This includes oil or grease spills. What can I do in the bathroom? Use night lights. Install grab bars by the toilet and in the tub and shower. Do not use towel bars as grab bars. Use non-skid mats or decals in the tub or shower. If you need to sit down in the shower, use a plastic, non-slip stool. Keep the floor dry. Clean up any water that spills on the floor as soon as it happens. Remove soap buildup in the tub or shower regularly. Attach bath mats securely with double-sided non-slip rug tape. Do not have throw rugs and other things on the floor that can make you trip. What can I do in the bedroom? Use night lights. Make sure that you have a light by your bed that is easy  to reach. Do not use any sheets or blankets that are too big for your bed. They should not hang down onto the floor. Have a firm chair that has side arms. You can use this for support while you get dressed. Do not have throw rugs and other things on the floor that can make you trip. What can I do in the kitchen? Clean up any spills right away. Avoid walking on wet floors. Keep items that you use a lot in easy-to-reach places. If you need to reach something above you, use a strong step stool that has a grab bar. Keep electrical cords out of the way. Do not use floor polish or wax that makes floors slippery. If you must use wax, use non-skid floor wax. Do not have throw rugs and other things on the floor that can make you trip. What can I do with my stairs? Do not leave any items on the stairs. Make sure that there are handrails on both sides of the stairs and use them. Fix handrails that are broken or loose. Make sure that handrails are as long as the stairways. Check any carpeting to make sure  that it is firmly attached to the stairs. Fix any carpet that is loose or worn. Avoid having throw rugs at the top or bottom of the stairs. If you do have throw rugs, attach them to the floor with carpet tape. Make sure that you have a light switch at the top of the stairs and the bottom of the stairs. If you do not have them, ask someone to add them for you. What else can I do to help prevent falls? Wear shoes that: Do not have high heels. Have rubber bottoms. Are comfortable and fit you well. Are closed at the toe. Do not wear sandals. If you use a stepladder: Make sure that it is fully opened. Do not climb a closed stepladder. Make sure that both sides of the stepladder are locked into place. Ask someone to hold it for you, if possible. Clearly mark and make sure that you can see: Any grab bars or handrails. First and last steps. Where the edge of each step is. Use tools that help you move  around (mobility aids) if they are needed. These include: Canes. Walkers. Scooters. Crutches. Turn on the lights when you go into a dark area. Replace any light bulbs as soon as they burn out. Set up your furniture so you have a clear path. Avoid moving your furniture around. If any of your floors are uneven, fix them. If there are any pets around you, be aware of where they are. Review your medicines with your doctor. Some medicines can make you feel dizzy. This can increase your chance of falling. Ask your doctor what other things that you can do to help prevent falls. This information is not intended to replace advice given to you by your health care provider. Make sure you discuss any questions you have with your health care provider. Document Released: 06/05/2009 Document Revised: 01/15/2016 Document Reviewed: 09/13/2014 Elsevier Interactive Patient Education  2017 [employer].

## 2023-01-20 DIAGNOSIS — M48062 Spinal stenosis, lumbar region with neurogenic claudication: Secondary | ICD-10-CM | POA: Diagnosis not present

## 2023-01-20 DIAGNOSIS — M4326 Fusion of spine, lumbar region: Secondary | ICD-10-CM | POA: Diagnosis not present

## 2023-01-24 ENCOUNTER — Ambulatory Visit (INDEPENDENT_AMBULATORY_CARE_PROVIDER_SITE_OTHER): Payer: Medicare Other | Admitting: Family

## 2023-01-24 VITALS — BP 135/76 | HR 65 | Temp 98.0°F | Resp 16 | Wt 265.0 lb

## 2023-01-24 DIAGNOSIS — K219 Gastro-esophageal reflux disease without esophagitis: Secondary | ICD-10-CM

## 2023-01-24 DIAGNOSIS — F32A Depression, unspecified: Secondary | ICD-10-CM

## 2023-01-24 DIAGNOSIS — R739 Hyperglycemia, unspecified: Secondary | ICD-10-CM | POA: Diagnosis not present

## 2023-01-24 DIAGNOSIS — J309 Allergic rhinitis, unspecified: Secondary | ICD-10-CM | POA: Diagnosis not present

## 2023-01-24 DIAGNOSIS — I5032 Chronic diastolic (congestive) heart failure: Secondary | ICD-10-CM | POA: Diagnosis not present

## 2023-01-24 DIAGNOSIS — I10 Essential (primary) hypertension: Secondary | ICD-10-CM

## 2023-01-24 DIAGNOSIS — F419 Anxiety disorder, unspecified: Secondary | ICD-10-CM | POA: Diagnosis not present

## 2023-01-24 DIAGNOSIS — I719 Aortic aneurysm of unspecified site, without rupture: Secondary | ICD-10-CM | POA: Diagnosis not present

## 2023-01-24 DIAGNOSIS — E785 Hyperlipidemia, unspecified: Secondary | ICD-10-CM | POA: Diagnosis not present

## 2023-01-24 DIAGNOSIS — D649 Anemia, unspecified: Secondary | ICD-10-CM | POA: Diagnosis not present

## 2023-01-24 DIAGNOSIS — R0609 Other forms of dyspnea: Secondary | ICD-10-CM

## 2023-01-24 LAB — LIPID PANEL
Cholesterol: 195 mg/dL (ref 0–200)
HDL: 41.6 mg/dL (ref >39.00)
NonHDL: 153.84
Total CHOL/HDL Ratio: 5
Triglycerides: 263 mg/dL — ABNORMAL HIGH (ref 0.0–149.0)
VLDL: 52.6 mg/dL — ABNORMAL HIGH (ref 0.0–40.0)

## 2023-01-24 LAB — COMPREHENSIVE METABOLIC PANEL
ALT: 20 U/L (ref 0–53)
AST: 15 U/L (ref 0–37)
Albumin: 4.4 g/dL (ref 3.5–5.2)
Alkaline Phosphatase: 86 U/L (ref 39–117)
BUN: 21 mg/dL (ref 6–23)
CO2: 30 mEq/L (ref 19–32)
Calcium: 9.6 mg/dL (ref 8.4–10.5)
Chloride: 101 mEq/L (ref 96–112)
Creatinine, Ser: 0.94 mg/dL (ref 0.40–1.50)
GFR: 83.5 mL/min (ref >60.00)
Glucose, Bld: 94 mg/dL (ref 70–99)
Potassium: 4 mEq/L (ref 3.5–5.1)
Sodium: 140 mEq/L (ref 135–145)
Total Bilirubin: 0.4 mg/dL (ref 0.2–1.2)
Total Protein: 6.9 g/dL (ref 6.0–8.3)

## 2023-01-24 LAB — CBC WITH DIFFERENTIAL/PLATELET
Basophils Absolute: 0 10*3/uL (ref 0.0–0.1)
Basophils Relative: 0.5 % (ref 0.0–3.0)
Eosinophils Absolute: 0.2 10*3/uL (ref 0.0–0.7)
Eosinophils Relative: 2.4 % (ref 0.0–5.0)
HCT: 42.4 % (ref 39.0–52.0)
Hemoglobin: 14 g/dL (ref 13.0–17.0)
Lymphocytes Relative: 23.3 % (ref 12.0–46.0)
Lymphs Abs: 1.7 10*3/uL (ref 0.7–4.0)
MCHC: 32.9 g/dL (ref 30.0–36.0)
MCV: 90.4 fl (ref 78.0–100.0)
Monocytes Absolute: 0.7 10*3/uL (ref 0.1–1.0)
Monocytes Relative: 9.1 % (ref 3.0–12.0)
Neutro Abs: 4.7 10*3/uL (ref 1.4–7.7)
Neutrophils Relative %: 64.7 % (ref 43.0–77.0)
Platelets: 282 10*3/uL (ref 150.0–400.0)
RBC: 4.7 Mil/uL (ref 4.22–5.81)
RDW: 14 % (ref 11.5–15.5)
WBC: 7.3 10*3/uL (ref 4.0–10.5)

## 2023-01-24 LAB — LDL CHOLESTEROL, DIRECT: Direct LDL: 129 mg/dL

## 2023-01-24 LAB — HEMOGLOBIN A1C: Hgb A1c MFr Bld: 5.2 % (ref 4.6–6.5)

## 2023-01-24 LAB — BRAIN NATRIURETIC PEPTIDE: Pro B Natriuretic peptide (BNP): 31 pg/mL (ref 0.0–100.0)

## 2023-01-24 NOTE — Assessment & Plan Note (Signed)
Stable on omeprazole, continue same. 

## 2023-01-24 NOTE — Progress Notes (Signed)
Subjective:     [name], male    DOB: Nov 10, 1955, 67 y.o.   MRN: [id number]  Chief Complaint  Patient presents with   Hypertension    Here for follow up    Hypertension   Patient is in today for follow up.   Discussed the use of AI scribe software for clinical note transcription with the patient, who gave verbal consent to proceed.  History of Present Illness   The patient, with a history of heart disease, presents with worsening shortness of breath over the past one to two weeks. He reports becoming breathless with minimal exertion, such as shutting the car door or walking up stairs. He also notes a dry cough with occasional phlegm. He has noticed a weight gain of six pounds since April 10th. He denies chest pain or pressure.  The patient also reports a concern about a palpable mass, which he feels has increased in size. He is currently taking Allegra for allergies and iron supplements. He has a history of reflux, which is managed with daily omeprazole. He is also on hydrochlorothiazide, metoprolol, and Lotrel for blood pressure control.  In addition, the patient has a history of back problems and is scheduled for another surgery due to loosening screws from a previous procedure.  He is also under the care of a psychiatrist for mood management.       HTN-  BP Readings from Last 3 Encounters:  01/24/23 135/76  12/17/22 115/63  12/01/22 139/68   Wt Readings from Last 3 Encounters:  01/24/23 265 lb (120.2 kg)  12/17/22 264 lb (119.7 kg)  12/01/22 259 lb (117.5 kg)   Health Maintenance Due  Topic Date Due   Colonoscopy  09/23/2018   Pneumonia Vaccine 16+ Years old (2 of 2 - PCV) 03/17/2021    Past Medical History:  Diagnosis Date   Anemia 11/16/2014   Anxiety    Aortic aneurysm (HCC)    4.1 cm 2019, ascending aorta   Arthritis    Bladder disease    "an unusual bladder disease; don't know what it's called" (06/30/2015)   Bulging lumbar disc     Cataract    COVID-19 01/09/2021   Depression    GERD (gastroesophageal reflux disease)    History of hiatal hernia    Hyperglycemia 11/16/2014   Hyperlipemia    Hypertension    Joint pain, knee    Migraine    06/30/2015 "maybe 2-3 times/year; not as bad as I used to have them"   OSA (obstructive sleep apnea)    Sleep apnea     Past Surgical History:  Procedure Laterality Date   ESOPHAGOGASTRODUODENOSCOPY     ESOPHAGOGASTRODUODENOSCOPY (EGD) WITH ESOPHAGEAL DILATION  ~ 2014   JOINT REPLACEMENT     right knee   KNEE ARTHROSCOPY Right 08/23/1993   NASAL SEPTUM SURGERY  ~ 1972   SPINE SURGERY     TOTAL KNEE ARTHROPLASTY  10/11/2011   Procedure: TOTAL KNEE ARTHROPLASTY;  Surgeon: Raymon Mutton, MD;  Location: [facility] OR;  Service: Orthopedics;  Laterality: Right;   TRANSFORAMINAL LUMBAR INTERBODY FUSION W/ MIS 1 LEVEL Right 03/31/2021   Procedure: Right Lumbar five Sacral one Minimally invasive transforaminal lumbar interbody fusion;  Surgeon: Jadene Pierini, MD;  Location: [facility] OR;  Service: Neurosurgery;  Laterality: Right;   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)  ~2014    Family History  Problem Relation Age of Onset   Stroke Mother 25  Died of CVA   Hypertension Mother    Anesthesia problems Neg Hx    Hypotension Neg Hx    Malignant hyperthermia Neg Hx    Pseudochol deficiency Neg Hx     Social History   Socioeconomic History   Marital status: Married    Spouse name: Not on file   Number of children: 3   Years of education: Not on file   Highest education level: GED or equivalent  Occupational History   Not on file  Tobacco Use   Smoking status: Former    Packs/day: 1.50    Years: 6.00    Additional pack years: 0.00    Total pack years: 9.00    Types: Cigarettes    Quit date: 11/30/1976    Years since quitting: 46.1   Smokeless tobacco: Never   Tobacco comments:    "stopped smoking in ~ 1990"  Substance and Sexual Activity    Alcohol use: No    Alcohol/week: 0.0 standard drinks of alcohol   Drug use: No   Sexual activity: Yes  Other Topics Concern   Not on file  Social History Narrative   3 children    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   Completed 12th grade   Enjoys softball   Social Determinants of Health   Financial Resource Strain: Low Risk  (01/12/2023)   Overall Financial Resource Strain (CARDIA)    Difficulty of Paying Living Expenses: Not hard at all  Food Insecurity: No Food Insecurity (01/12/2023)   Hunger Vital Sign    Worried About Running Out of Food in the Last Year: Never true    Ran Out of Food in the Last Year: Never true  Transportation Needs: No Transportation Needs (01/12/2023)   PRAPARE - Administrator, Civil Service (Medical): No    Lack of Transportation (Non-Medical): No  Physical Activity: Inactive (01/12/2023)   Exercise Vital Sign    Days of Exercise per Week: 0 days    Minutes of Exercise per Session: 0 min  Stress: No Stress Concern Present (01/12/2023)   [employer] of Occupational Health - Occupational Stress Questionnaire    Feeling of Stress : Not at all  Social Connections: Socially Integrated (01/12/2023)   Social Connection and Isolation Panel [NHANES]    Frequency of Communication with Friends and Family: More than three times a week    Frequency of Social Gatherings with Friends and Family: More than three times a week    Attends Religious Services: More than 4 times per year    Active Member of [employer] or Organizations: Yes    Attends Engineer, structural: More than 4 times per year    Marital Status: Married  Catering manager Violence: Not At Risk (01/19/2023)   Humiliation, Afraid, Rape, and Kick questionnaire    Fear of Current or Ex-Partner: No    Emotionally Abused: No    Physically Abused: No    Sexually Abused: No    Outpatient Medications Prior to Visit  Medication Sig Dispense Refill    amLODipine-benazepril (LOTREL) 10-40 MG capsule Take 1 capsule by mouth once daily 90 capsule 0   ARIPiprazole (ABILIFY) 5 MG tablet Take 1 tablet (5 mg total) by mouth daily. 90 tablet 1   ascorbic acid (VITAMIN C) 500 MG tablet Take by mouth.     b complex vitamins tablet Take 1 tablet by mouth daily.     ferrous sulfate 325 (  65 FE) MG tablet Take 1 tablet (325 mg total) by mouth 2 (two) times daily with a meal. 60 tablet 0   fexofenadine (ALLEGRA) 180 MG tablet Take 1 tablet (180 mg total) by mouth daily. 30 tablet 0   furosemide (LASIX) 20 MG tablet Take 1 tablet by mouth once daily 90 tablet 1   hydrochlorothiazide (HYDRODIURIL) 25 MG tablet TAKE 1 TABLET(25 MG) BY MOUTH DAILY 90 tablet 1   metoprolol succinate (TOPROL-XL) 50 MG 24 hr tablet Take one tablet daily (50MG ) with or immediately following a meal. 90 tablet 1   Multiple Vitamin (MULTIVITAMIN WITH MINERALS) TABS tablet Take 1 tablet by mouth daily.     omeprazole (PRILOSEC) 20 MG capsule Take 1 capsule (20 mg total) by mouth daily. 30 capsule 3   potassium chloride (KLOR-CON M) 10 MEQ tablet Take 2 tablets by mouth in the AM and 1 tablet by mouth in the PM 270 tablet 1   sertraline (ZOLOFT) 100 MG tablet Take 1.5 tablets (150 mg total) by mouth daily. 135 tablet 1   No facility-administered medications prior to visit.    No Known Allergies  ROS    See HPI Objective:    Physical Exam Constitutional:      General: He is not in acute distress.    Appearance: He is well-developed.  HENT:     Head: Normocephalic and atraumatic.  Cardiovascular:     Rate and Rhythm: Normal rate and regular rhythm.     Heart sounds: No murmur heard. Pulmonary:     Effort: Pulmonary effort is normal. No respiratory distress.     Breath sounds: Normal breath sounds. No wheezing or rales.  Musculoskeletal:     Right lower leg: 2+ Edema present.     Left lower leg: 2+ Edema present.  Skin:    General: Skin is warm and dry.  Neurological:      Mental Status: He is alert and oriented to person, place, and time.  Psychiatric:        Behavior: Behavior normal.        Thought Content: Thought content normal.     BP 135/76   Pulse 65   Temp 98 F (36.7 C) (Oral)   Resp 16   Wt 265 lb (120.2 kg)   SpO2 95%   BMI 42.77 kg/m  Wt Readings from Last 3 Encounters:  01/24/23 265 lb (120.2 kg)  12/17/22 264 lb (119.7 kg)  12/01/22 259 lb (117.5 kg)       Assessment & Plan:   Problem List Items Addressed This Visit       Unprioritized   Hyperlipidemia    Update lipids.       Relevant Orders   Lipid panel   Hyperglycemia    Lab Results  Component Value Date   HGBA1C 5.7 07/26/2022  Update A1C.       Relevant Orders   Comp Met (CMET)   HgB A1c   HTN (hypertension)    Maintained on hctz, metoprolol and lotrel. BP at goal.       GERD (gastroesophageal reflux disease)    Stable on omeprazole, continue same.       DOE (dyspnea on exertion)    Likely multifactorial- deconditioning, Diastolic CHF,  Pulmonary HTN from OSA, morbid obesity.  Recommended the following:  Shortness of Breath: Worsening over the past 1-2 weeks, associated with activities such as bending over and walking up stairs. Recent weight gain of 6 pounds since April  10th. Dry cough with phlegm. No chest pain. Lungs clear on auscultation. -Increase Furosemide to 40mg  daily for 3 days, then return to 20mg  daily. -Refer to cardiology for further evaluation. -Order repeat heart ultrasound.       Relevant Orders   B Nat Peptide   ECHOCARDIOGRAM COMPLETE   Ambulatory referral to Cardiology   Chronic diastolic heart failure (HCC)    Notes occasional pedal edmea.       Relevant Orders   B Nat Peptide   ECHOCARDIOGRAM COMPLETE   Aortic aneurysm (HCC)    Needs follow up US 10/24.      Anxiety and depression    Stable on current medications. Followed by psychiatry.       Anemia    Lab Results  Component Value Date   WBC 7.6  10/04/2022   HGB 14.4 10/04/2022   HCT 42.1 10/04/2022   MCV 89.3 10/04/2022   PLT 295.0 10/04/2022  Continues iron.        Relevant Orders   CBC w/Diff   Allergic rhinitis - Primary    Reports allergies are stable on allegra. Continue same.        I am having Vegas S. Kunz "Tim" maintain his b complex vitamins, multivitamin with minerals, ferrous sulfate, fexofenadine, omeprazole, ascorbic acid, furosemide, ARIPiprazole, sertraline, potassium chloride, amLODipine-benazepril, hydrochlorothiazide, and metoprolol succinate.  No orders of the defined types were placed in this encounter.

## 2023-01-24 NOTE — Assessment & Plan Note (Addendum)
Reports allergies are stable on allegra. Continue same.

## 2023-01-24 NOTE — Assessment & Plan Note (Signed)
Needs follow up US 10/24.

## 2023-01-24 NOTE — Assessment & Plan Note (Signed)
Lab Results  Component Value Date   HGBA1C 5.7 07/26/2022   Update A1C.

## 2023-01-24 NOTE — Assessment & Plan Note (Signed)
Likely multifactorial- deconditioning, Diastolic CHF,  Pulmonary HTN from OSA, morbid obesity.  Recommended the following:  Shortness of Breath: Worsening over the past 1-2 weeks, associated with activities such as bending over and walking up stairs. Recent weight gain of 6 pounds since April 10th. Dry cough with phlegm. No chest pain. Lungs clear on auscultation. -Increase Furosemide to 40mg  daily for 3 days, then return to 20mg  daily. -Refer to cardiology for further evaluation. -Order repeat heart ultrasound.

## 2023-01-24 NOTE — Assessment & Plan Note (Addendum)
Maintained on hctz, metoprolol and lotrel. BP at goal.

## 2023-01-24 NOTE — Assessment & Plan Note (Signed)
Stable on current medications. Followed by psychiatry. 

## 2023-01-24 NOTE — Patient Instructions (Signed)
-  Repeat ultrasound of aorta in the fall. -Continue daily Allegra for allergies. -Continue working with Dr. Alcide Clever for mental health. -Plan for back surgery with orthopedics due to loosening screws. -increase furosemide to 40mg  once daily for 3 days, then decrease ot 20mg  once daily. Call if DOE worsens or if it does not improve.

## 2023-01-24 NOTE — Assessment & Plan Note (Signed)
Lab Results  Component Value Date   WBC 7.6 10/04/2022   HGB 14.4 10/04/2022   HCT 42.1 10/04/2022   MCV 89.3 10/04/2022   PLT 295.0 10/04/2022   Continues iron.

## 2023-01-24 NOTE — Assessment & Plan Note (Signed)
Notes occasional pedal edmea.

## 2023-01-24 NOTE — Assessment & Plan Note (Signed)
-  Update lipids 

## 2023-01-26 ENCOUNTER — Ambulatory Visit (HOSPITAL_BASED_OUTPATIENT_CLINIC_OR_DEPARTMENT_OTHER)
Admission: RE | Admit: 2023-01-26 | Discharge: 2023-01-26 | Disposition: A | Discharge status: Home / Self Care | Payer: Medicare Other | Source: Ambulatory Visit | Attending: Family | Admitting: Family

## 2023-01-26 DIAGNOSIS — I5032 Chronic diastolic (congestive) heart failure: Secondary | ICD-10-CM | POA: Insufficient documentation

## 2023-01-26 DIAGNOSIS — R0609 Other forms of dyspnea: Secondary | ICD-10-CM | POA: Diagnosis not present

## 2023-01-26 LAB — ECHOCARDIOGRAM COMPLETE
AR max vel: 3.33 cm2
AV Area VTI: 3.43 cm2
AV Area mean vel: 3.3 cm2
AV Mean grad: 7 mmHg
AV Peak grad: 12.4 mmHg
Ao pk vel: 1.76 m/s
Area-P 1/2: 2.97 cm2
P 1/2 time: 777 msec
S' Lateral: 2.9 cm

## 2023-02-07 NOTE — Progress Notes (Signed)
Referring-Melissa Peggyann Juba NP Reason for referral-dyspnea on exertion  HPI: 67 year old male for evaluation of dyspnea on exertion at the request of Sandford Craze, NP.  Nuclear study March 2019 showed ejection fraction 61% and normal perfusion.  CTA October 2023 showed stable dilatation of the thoracic aortic aneurysm measuring 4 cm.  Chest x-ray February 2024 showed no active cardiopulmonary disease.  Echocardiogram June 2024 showed normal LV function, mild left ventricular hypertrophy, dilated ascending aorta at 43 mm and trace aortic insufficiency.  Laboratories June 2024 showed LDL 129, creatinine 0.94, proBNP 31, and hemoglobin 14.  For the past 1 year patient has noticed increased dyspnea with activities.  There is no chest pain.  No orthopnea, PND, fevers, chills.  Occasional minimal pedal edema.  Cardiology now asked to evaluate.  Current Outpatient Medications  Medication Sig Dispense Refill   amLODipine-benazepril (LOTREL) 10-40 MG capsule Take 1 capsule by mouth once daily 90 capsule 0   ARIPiprazole (ABILIFY) 5 MG tablet Take 1 tablet (5 mg total) by mouth daily. 90 tablet 1   ascorbic acid (VITAMIN C) 500 MG tablet Take by mouth.     b complex vitamins tablet Take 1 tablet by mouth daily.     ferrous sulfate 325 (65 FE) MG tablet Take 1 tablet (325 mg total) by mouth 2 (two) times daily with a meal. 60 tablet 0   fexofenadine (ALLEGRA) 180 MG tablet Take 1 tablet (180 mg total) by mouth daily. 30 tablet 0   furosemide (LASIX) 20 MG tablet Take 1 tablet by mouth once daily 90 tablet 1   hydrochlorothiazide (HYDRODIURIL) 25 MG tablet TAKE 1 TABLET(25 MG) BY MOUTH DAILY 90 tablet 1   metoprolol succinate (TOPROL-XL) 50 MG 24 hr tablet Take one tablet daily (50MG ) with or immediately following a meal. 90 tablet 1   Multiple Vitamin (MULTIVITAMIN WITH MINERALS) TABS tablet Take 1 tablet by mouth daily.     omeprazole (PRILOSEC) 20 MG capsule Take 1 capsule (20 mg total) by  mouth daily. 30 capsule 3   potassium chloride (KLOR-CON M) 10 MEQ tablet Take 2 tablets by mouth in the AM and 1 tablet by mouth in the PM 270 tablet 1   sertraline (ZOLOFT) 100 MG tablet Take 1.5 tablets (150 mg total) by mouth daily. 135 tablet 1   No current facility-administered medications for this visit.    No Known Allergies   Past Medical History:  Diagnosis Date   Anemia 11/16/2014   Anxiety    Aortic aneurysm (HCC)    4.1 cm 2019, ascending aorta   Arthritis    Bladder disease    "an unusual bladder disease; don't know what it's called" (06/30/2015)   Bulging lumbar disc    Cataract    COVID-19 01/09/2021   Depression    GERD (gastroesophageal reflux disease)    History of hiatal hernia    Hyperglycemia 11/16/2014   Hyperlipemia    Hypertension    Joint pain, knee    Migraine    06/30/2015 "maybe 2-3 times/year; not as bad as I used to have them"   OSA (obstructive sleep apnea)    Sleep apnea     Past Surgical History:  Procedure Laterality Date   ESOPHAGOGASTRODUODENOSCOPY     ESOPHAGOGASTRODUODENOSCOPY (EGD) WITH ESOPHAGEAL DILATION  ~ 2014   JOINT REPLACEMENT     right knee   KNEE ARTHROSCOPY Right 08/23/1993   NASAL SEPTUM SURGERY  ~ 1972   SPINE SURGERY  TOTAL KNEE ARTHROPLASTY  10/11/2011   Procedure: TOTAL KNEE ARTHROPLASTY;  Surgeon: Raymon Mutton, MD;  Location: [facility] OR;  Service: Orthopedics;  Laterality: Right;   TRANSFORAMINAL LUMBAR INTERBODY FUSION W/ MIS 1 LEVEL Right 03/31/2021   Procedure: Right Lumbar five Sacral one Minimally invasive transforaminal lumbar interbody fusion;  Surgeon: Jadene Pierini, MD;  Location: [facility] OR;  Service: Neurosurgery;  Laterality: Right;   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)  ~2014    Social History   Socioeconomic History   Marital status: Married    Spouse name: Not on file   Number of children: 3   Years of education: Not on file   Highest education level: GED or  equivalent  Occupational History   Not on file  Tobacco Use   Smoking status: Former    Packs/day: 1.50    Years: 6.00    Additional pack years: 0.00    Total pack years: 9.00    Types: Cigarettes    Quit date: 11/30/1976    Years since quitting: 46.2   Smokeless tobacco: Never   Tobacco comments:    "stopped smoking in ~ 1990"  Substance and Sexual Activity   Alcohol use: No    Alcohol/week: 0.0 standard drinks of alcohol   Drug use: No   Sexual activity: Yes  Other Topics Concern   Not on file  Social History Narrative   3 children    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   Completed 12th grade   Enjoys softball   Social Determinants of Health   Financial Resource Strain: Low Risk  (01/12/2023)   Overall Financial Resource Strain (CARDIA)    Difficulty of Paying Living Expenses: Not hard at all  Food Insecurity: No Food Insecurity (01/12/2023)   Hunger Vital Sign    Worried About Running Out of Food in the Last Year: Never true    Ran Out of Food in the Last Year: Never true  Transportation Needs: No Transportation Needs (01/12/2023)   PRAPARE - Administrator, Civil Service (Medical): No    Lack of Transportation (Non-Medical): No  Physical Activity: Inactive (01/12/2023)   Exercise Vital Sign    Days of Exercise per Week: 0 days    Minutes of Exercise per Session: 0 min  Stress: No Stress Concern Present (01/12/2023)   [employer] of Occupational Health - Occupational Stress Questionnaire    Feeling of Stress : Not at all  Social Connections: Socially Integrated (01/12/2023)   Social Connection and Isolation Panel [NHANES]    Frequency of Communication with Friends and Family: More than three times a week    Frequency of Social Gatherings with Friends and Family: More than three times a week    Attends Religious Services: More than 4 times per year    Active Member of [employer] or Organizations: Yes    Attends Museum/gallery exhibitions officer: More than 4 times per year    Marital Status: Married  Catering manager Violence: Not At Risk (01/19/2023)   Humiliation, Afraid, Rape, and Kick questionnaire    Fear of Current or Ex-Partner: No    Emotionally Abused: No    Physically Abused: No    Sexually Abused: No    Family History  Problem Relation Age of Onset   Stroke Mother 57       Died of CVA   Hypertension Mother    Anesthesia problems Neg Hx  Hypotension Neg Hx    Malignant hyperthermia Neg Hx    Pseudochol deficiency Neg Hx     ROS: Back pain but no fevers or chills, productive cough, hemoptysis, dysphasia, odynophagia, melena, hematochezia, dysuria, hematuria, rash, seizure activity, orthopnea, PND, pedal edema, claudication. Remaining systems are negative.  Physical Exam:   Blood pressure 138/78, pulse 73, height 5\' 6"  (1.676 m), weight 268 lb (121.6 kg), SpO2 94 %.  General:  Well developed/obese in NAD Skin warm/dry Patient not depressed No peripheral clubbing Back-normal HEENT-normal/normal eyelids Neck supple/normal carotid upstroke bilaterally; no bruits; no JVD; no thyromegaly chest - CTA/ normal expansion CV - RRR/normal S1 and S2; no murmurs, rubs or gallops;  PMI nondisplaced Abdomen -NT/ND, no HSM, no mass, + bowel sounds, no bruit 2+ femoral pulses, no bruits Ext-no edema, chords, 2+ DP Neuro-grossly nonfocal  ECG -normal sinus rhythm, no significant ST changes.  Personally reviewed  A/P  1 dyspnea on exertion-etiology unclear.  LV function is normal on most recent echocardiogram.  Electrocardiogram is also normal.  I will arrange a cardiac CTA to rule out obstructive coronary disease given multiple risk factors.  Dyspnea could also be due to obesity and deconditioning as he states he has not been active since his back surgery in February.  Note his dyspnea preceded that.  He also has obstructive sleep apnea and will continue on CPAP.  2 hypertension-continue present  blood pressure medications and follow-up.  3 chronic diastolic congestive heart failure-continue diuretic at present dose.  4 morbid obesity-we discussed the importance of weight loss.  5 hyperlipidemia-if cardiac CTA shows coronary disease he will need statin therapy.  6 obstructive sleep apnea-continue CPAP.  7 dilated thoracic aorta-this will be reassessed at time of follow-up cardiac CTA.  Olga Millers, MD

## 2023-02-16 ENCOUNTER — Encounter: Payer: Self-pay | Admitting: Cardiology

## 2023-02-16 ENCOUNTER — Ambulatory Visit: Payer: Medicare Other | Attending: Cardiology | Admitting: Cardiology

## 2023-02-16 VITALS — BP 138/78 | HR 73 | Ht 66.0 in | Wt 268.0 lb

## 2023-02-16 DIAGNOSIS — I5032 Chronic diastolic (congestive) heart failure: Secondary | ICD-10-CM | POA: Diagnosis not present

## 2023-02-16 DIAGNOSIS — I10 Essential (primary) hypertension: Secondary | ICD-10-CM

## 2023-02-16 DIAGNOSIS — R0609 Other forms of dyspnea: Secondary | ICD-10-CM | POA: Diagnosis not present

## 2023-02-16 DIAGNOSIS — E7800 Pure hypercholesterolemia, unspecified: Secondary | ICD-10-CM | POA: Diagnosis not present

## 2023-02-16 MED ORDER — METOPROLOL TARTRATE 100 MG PO TABS: ORAL_TABLET | ORAL | 0 refills | Status: DC

## 2023-02-16 NOTE — Patient Instructions (Signed)
Testing/Procedures:    Your cardiac CT will be scheduled at   [facility] [address] [place], [place] [address] [phone]    If scheduled at [facility], please arrive at the [facility] Entrance (Entrance C2) of [facility] 30 minutes prior to test start time. You can use the FREE valet parking offered at entrance C (encouraged to control the heart rate for the test)  Proceed to the [facility] Radiology Department (first floor) to check-in and test prep.  All radiology patients and guests should use entrance C2 at [facility], accessed from [facility], even though the hospital's physical address listed is [address]     Please follow these instructions carefully (unless otherwise directed):  An IV will be required for this test and Nitroglycerin will be given.  Hold all erectile dysfunction medications at least 3 days (72 hrs) prior to test. (Ie viagra, cialis, sildenafil, tadalafil, etc)   On the Night Before the Test: Be sure to Drink plenty of water. Do not consume any caffeinated/decaffeinated beverages or chocolate 12 hours prior to your test. Do not take any antihistamines 12 hours prior to your test.   On the Day of the Test: Drink plenty of water until 1 hour prior to the test. Do not eat any food 1 hour prior to test. You may take your regular medications prior to the test.  Take metoprolol (Lopressor) 100 mg two hours prior to test. DO NOT TAKE FUROSEMIDE OR HYDROCHLOROTHIAZIDE THE MORNING OF THE SCAN       After the Test: Drink plenty of water. After receiving IV contrast, you may experience a mild flushed feeling. This is normal. On occasion, you may experience a mild rash up to 24 hours after the test. This is not dangerous. If this occurs, you can take Benadryl 25 mg and increase your fluid intake. If you experience trouble breathing, this can be serious. If it is severe  call 911 IMMEDIATELY. If it is mild, please call our office.   We will call to schedule your test 2-4 weeks out understanding that some insurance companies will need an authorization prior to the service being performed.   For more information and frequently asked questions, please visit our website : [email or web address]  For non-scheduling related questions, please contact the cardiac imaging nurse navigator should you have any questions/concerns: Rockwell Alexandria, Cardiac Imaging Nurse Navigator Larey Brick, Cardiac Imaging Nurse Navigator [facility] Direct Office Dial: [phone]   For scheduling needs, including cancellations and rescheduling, please call [place], [phone].   Follow-Up: At [facility], you and your health needs are our priority.  As part of our continuing mission to provide you with exceptional heart care, we have created designated Provider Care Teams.  These Care Teams include your primary Cardiologist (physician) and Advanced Practice Providers (APPs -  Physician Assistants and Nurse Practitioners) who all work together to provide you with the care you need, when you need it.  We recommend signing up for the patient portal called "MyChart".  Sign up information is provided on this After Visit Summary.  MyChart is used to connect with patients for Virtual Visits (Telemedicine).  Patients are able to view lab/test results, encounter notes, upcoming appointments, etc.  Non-urgent messages can be sent to your provider as well.   To learn more about what you can do with MyChart, go to [email or web address].    Your  next appointment:   12 month(s)  Provider:   Olga Millers MD

## 2023-02-18 DIAGNOSIS — M960 Pseudarthrosis after fusion or arthrodesis: Secondary | ICD-10-CM | POA: Diagnosis not present

## 2023-02-18 DIAGNOSIS — M48062 Spinal stenosis, lumbar region with neurogenic claudication: Secondary | ICD-10-CM | POA: Diagnosis not present

## 2023-02-18 DIAGNOSIS — T84216A Breakdown (mechanical) of internal fixation device of vertebrae, initial encounter: Secondary | ICD-10-CM | POA: Diagnosis not present

## 2023-02-18 DIAGNOSIS — Z6841 Body Mass Index (BMI) 40.0 and over, adult: Secondary | ICD-10-CM | POA: Diagnosis not present

## 2023-02-21 ENCOUNTER — Encounter (HOSPITAL_COMMUNITY): Payer: Self-pay

## 2023-02-22 ENCOUNTER — Telehealth (HOSPITAL_COMMUNITY): Payer: Self-pay | Admitting: *Deleted

## 2023-02-22 DIAGNOSIS — Z9889 Other specified postprocedural states: Secondary | ICD-10-CM | POA: Diagnosis not present

## 2023-02-22 DIAGNOSIS — M48062 Spinal stenosis, lumbar region with neurogenic claudication: Secondary | ICD-10-CM | POA: Diagnosis not present

## 2023-02-22 DIAGNOSIS — M545 Low back pain, unspecified: Secondary | ICD-10-CM | POA: Diagnosis not present

## 2023-02-22 DIAGNOSIS — Z79899 Other long term (current) drug therapy: Secondary | ICD-10-CM | POA: Diagnosis not present

## 2023-02-22 DIAGNOSIS — Z87891 Personal history of nicotine dependence: Secondary | ICD-10-CM | POA: Diagnosis not present

## 2023-02-22 NOTE — Telephone Encounter (Signed)
Attempted to call patient regarding upcoming cardiac CT appointment. °Left message on voicemail with name and callback number ° °Keiran Gaffey RN Navigator Cardiac Imaging °[facility] °[phone] Office °[phone] Cell ° °

## 2023-02-23 ENCOUNTER — Ambulatory Visit (HOSPITAL_COMMUNITY)
Admission: RE | Admit: 2023-02-23 | Discharge: 2023-02-23 | Disposition: A | Discharge status: Home / Self Care | Payer: Medicare Other | Source: Ambulatory Visit | Attending: Cardiology | Admitting: Cardiology

## 2023-02-23 DIAGNOSIS — R0609 Other forms of dyspnea: Secondary | ICD-10-CM | POA: Insufficient documentation

## 2023-02-23 DIAGNOSIS — I2510 Atherosclerotic heart disease of native coronary artery without angina pectoris: Secondary | ICD-10-CM | POA: Insufficient documentation

## 2023-02-23 DIAGNOSIS — I700 Atherosclerosis of aorta: Secondary | ICD-10-CM | POA: Diagnosis not present

## 2023-02-23 MED ORDER — IOHEXOL 350 MG/ML SOLN
115.0000 mL | Freq: Once | INTRAVENOUS | Status: AC | PRN
  Administered 2023-02-23: 115 mL via INTRAVENOUS

## 2023-02-23 MED ORDER — NITROGLYCERIN 0.4 MG SL SUBL
0.8000 mg | SUBLINGUAL_TABLET | Freq: Once | SUBLINGUAL | Status: AC
  Administered 2023-02-23: 0.8 mg via SUBLINGUAL

## 2023-02-23 MED ORDER — NITROGLYCERIN 0.4 MG SL SUBL
SUBLINGUAL_TABLET | SUBLINGUAL | Status: AC
  Filled 2023-02-23: qty 2

## 2023-02-25 ENCOUNTER — Telehealth: Payer: Self-pay | Admitting: *Deleted

## 2023-02-25 ENCOUNTER — Telehealth: Payer: Self-pay | Admitting: Psychiatry

## 2023-02-25 DIAGNOSIS — R931 Abnormal findings on diagnostic imaging of heart and coronary circulation: Secondary | ICD-10-CM

## 2023-02-25 MED ORDER — ASPIRIN 81 MG PO TBEC: 81.0000 mg | DELAYED_RELEASE_TABLET | Freq: Every day | ORAL | 3 refills | Status: AC

## 2023-02-25 MED ORDER — ROSUVASTATIN CALCIUM 40 MG PO TABS: 40.0000 mg | ORAL_TABLET | Freq: Every day | ORAL | 3 refills | Status: DC

## 2023-02-25 NOTE — Telephone Encounter (Signed)
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 Olga Millers

## 2023-02-25 NOTE — Telephone Encounter (Signed)
Pt called and said that his abilify needs a PA for his refill

## 2023-02-25 NOTE — Telephone Encounter (Signed)
pt aware of results  New script sent to the pharmacy  Lab orders mailed to the pt  Patient questioned if he was okay for back surgery on Monday. Aware will forward to dr Jens Som to review.

## 2023-02-25 NOTE — Telephone Encounter (Signed)
Will forward this message to Dr Sharolyn Douglas.

## 2023-02-27 ENCOUNTER — Other Ambulatory Visit: Payer: Self-pay

## 2023-02-27 DIAGNOSIS — F333 Major depressive disorder, recurrent, severe with psychotic symptoms: Secondary | ICD-10-CM

## 2023-02-27 MED ORDER — ARIPIPRAZOLE 5 MG PO TABS
5.0000 mg | ORAL_TABLET | Freq: Every day | ORAL | 5 refills | Status: DC
Start: 2023-02-27 — End: 2023-04-11

## 2023-02-27 NOTE — Telephone Encounter (Signed)
Pt was already approved for one year till 2025 unless insurance has changed. Will review

## 2023-02-28 DIAGNOSIS — M4317 Spondylolisthesis, lumbosacral region: Secondary | ICD-10-CM | POA: Diagnosis not present

## 2023-02-28 DIAGNOSIS — M48062 Spinal stenosis, lumbar region with neurogenic claudication: Secondary | ICD-10-CM | POA: Diagnosis not present

## 2023-02-28 DIAGNOSIS — I10 Essential (primary) hypertension: Secondary | ICD-10-CM | POA: Diagnosis not present

## 2023-02-28 DIAGNOSIS — Z981 Arthrodesis status: Secondary | ICD-10-CM | POA: Diagnosis not present

## 2023-02-28 DIAGNOSIS — M4726 Other spondylosis with radiculopathy, lumbar region: Secondary | ICD-10-CM | POA: Diagnosis not present

## 2023-02-28 DIAGNOSIS — T84038A Mechanical loosening of other internal prosthetic joint, initial encounter: Secondary | ICD-10-CM | POA: Diagnosis not present

## 2023-02-28 DIAGNOSIS — M4316 Spondylolisthesis, lumbar region: Secondary | ICD-10-CM | POA: Diagnosis not present

## 2023-02-28 DIAGNOSIS — T84226A Displacement of internal fixation device of vertebrae, initial encounter: Secondary | ICD-10-CM | POA: Diagnosis not present

## 2023-02-28 DIAGNOSIS — Z79899 Other long term (current) drug therapy: Secondary | ICD-10-CM | POA: Diagnosis not present

## 2023-02-28 DIAGNOSIS — E785 Hyperlipidemia, unspecified: Secondary | ICD-10-CM | POA: Diagnosis not present

## 2023-02-28 DIAGNOSIS — M960 Pseudarthrosis after fusion or arthrodesis: Secondary | ICD-10-CM | POA: Diagnosis not present

## 2023-02-28 DIAGNOSIS — Y792 Prosthetic and other implants, materials and accessory orthopedic devices associated with adverse incidents: Secondary | ICD-10-CM | POA: Diagnosis not present

## 2023-02-28 DIAGNOSIS — G4733 Obstructive sleep apnea (adult) (pediatric): Secondary | ICD-10-CM | POA: Diagnosis not present

## 2023-02-28 DIAGNOSIS — Y838 Other surgical procedures as the cause of abnormal reaction of the patient, or of later complication, without mention of misadventure at the time of the procedure: Secondary | ICD-10-CM | POA: Diagnosis not present

## 2023-02-28 DIAGNOSIS — K219 Gastro-esophageal reflux disease without esophagitis: Secondary | ICD-10-CM | POA: Diagnosis not present

## 2023-02-28 DIAGNOSIS — T84216A Breakdown (mechanical) of internal fixation device of vertebrae, initial encounter: Secondary | ICD-10-CM | POA: Diagnosis not present

## 2023-03-01 DIAGNOSIS — T84226A Displacement of internal fixation device of vertebrae, initial encounter: Secondary | ICD-10-CM | POA: Diagnosis not present

## 2023-03-01 DIAGNOSIS — M48062 Spinal stenosis, lumbar region with neurogenic claudication: Secondary | ICD-10-CM | POA: Diagnosis not present

## 2023-03-01 DIAGNOSIS — G4733 Obstructive sleep apnea (adult) (pediatric): Secondary | ICD-10-CM | POA: Diagnosis not present

## 2023-03-01 DIAGNOSIS — M4726 Other spondylosis with radiculopathy, lumbar region: Secondary | ICD-10-CM | POA: Diagnosis not present

## 2023-03-01 DIAGNOSIS — M4317 Spondylolisthesis, lumbosacral region: Secondary | ICD-10-CM | POA: Diagnosis not present

## 2023-03-01 DIAGNOSIS — I10 Essential (primary) hypertension: Secondary | ICD-10-CM | POA: Diagnosis not present

## 2023-03-01 DIAGNOSIS — E785 Hyperlipidemia, unspecified: Secondary | ICD-10-CM | POA: Diagnosis not present

## 2023-03-01 DIAGNOSIS — Z981 Arthrodesis status: Secondary | ICD-10-CM | POA: Diagnosis not present

## 2023-03-01 DIAGNOSIS — Y838 Other surgical procedures as the cause of abnormal reaction of the patient, or of later complication, without mention of misadventure at the time of the procedure: Secondary | ICD-10-CM | POA: Diagnosis not present

## 2023-03-01 DIAGNOSIS — K219 Gastro-esophageal reflux disease without esophagitis: Secondary | ICD-10-CM | POA: Diagnosis not present

## 2023-03-01 DIAGNOSIS — M960 Pseudarthrosis after fusion or arthrodesis: Secondary | ICD-10-CM | POA: Diagnosis not present

## 2023-03-01 DIAGNOSIS — M47816 Spondylosis without myelopathy or radiculopathy, lumbar region: Secondary | ICD-10-CM | POA: Diagnosis not present

## 2023-03-01 DIAGNOSIS — Z9889 Other specified postprocedural states: Secondary | ICD-10-CM | POA: Diagnosis not present

## 2023-03-01 DIAGNOSIS — Z79899 Other long term (current) drug therapy: Secondary | ICD-10-CM | POA: Diagnosis not present

## 2023-03-01 DIAGNOSIS — Y792 Prosthetic and other implants, materials and accessory orthopedic devices associated with adverse incidents: Secondary | ICD-10-CM | POA: Diagnosis not present

## 2023-03-04 ENCOUNTER — Other Ambulatory Visit: Payer: Self-pay | Admitting: Family

## 2023-03-14 ENCOUNTER — Ambulatory Visit: Payer: Medicare Other | Admitting: Family

## 2023-03-18 DIAGNOSIS — M4326 Fusion of spine, lumbar region: Secondary | ICD-10-CM | POA: Diagnosis not present

## 2023-03-22 ENCOUNTER — Other Ambulatory Visit (HOSPITAL_COMMUNITY): Payer: Self-pay

## 2023-03-28 ENCOUNTER — Telehealth: Payer: Self-pay | Admitting: Family

## 2023-03-28 NOTE — Telephone Encounter (Signed)
Medication: pt is not sure if he is supposed to take metoprolol tartrate (LOPRESSOR) 100 MG or metoprolol succinate (TOPROL-XL) 50 MG 24 hr tablet   Also needing amLODipine-benazepril (LOTREL) 10-40 MG capsule  Has the patient contacted their pharmacy? Yes.     Preferred Pharmacy:  [facility] [address] [place], [place] - [address], [place] [address] Phone: [phone]  Fax: [phone]

## 2023-03-29 ENCOUNTER — Other Ambulatory Visit: Payer: Self-pay

## 2023-03-29 NOTE — Telephone Encounter (Signed)
Patient prescribed Toprol 01/02/23 per pcp and Lopressor 02/16/23 by Dr. Jens Som.  Pharmacy asking which one is he supposed to be on.

## 2023-03-30 ENCOUNTER — Other Ambulatory Visit: Payer: Self-pay

## 2023-03-30 MED ORDER — AMLODIPINE BESY-BENAZEPRIL HCL 10-40 MG PO CAPS: 1.0000 | ORAL_CAPSULE | Freq: Every day | ORAL | 0 refills | Status: DC

## 2023-03-30 NOTE — Telephone Encounter (Signed)
Patient provided with this information and he verbalized understanding

## 2023-03-30 NOTE — Telephone Encounter (Signed)
Toprol xl is his daily medication. Dr. Jens Som sent a separate rx for metoprolol 100 that he is to take once prior to cardiac procedure.

## 2023-04-05 DIAGNOSIS — K319 Disease of stomach and duodenum, unspecified: Secondary | ICD-10-CM | POA: Diagnosis not present

## 2023-04-05 DIAGNOSIS — K219 Gastro-esophageal reflux disease without esophagitis: Secondary | ICD-10-CM | POA: Diagnosis not present

## 2023-04-11 ENCOUNTER — Ambulatory Visit (INDEPENDENT_AMBULATORY_CARE_PROVIDER_SITE_OTHER): Payer: Medicare Other | Admitting: Psychiatry

## 2023-04-11 ENCOUNTER — Encounter: Payer: Self-pay | Admitting: Psychiatry

## 2023-04-11 DIAGNOSIS — G4730 Sleep apnea, unspecified: Secondary | ICD-10-CM

## 2023-04-11 DIAGNOSIS — F333 Major depressive disorder, recurrent, severe with psychotic symptoms: Secondary | ICD-10-CM

## 2023-04-11 DIAGNOSIS — R635 Abnormal weight gain: Secondary | ICD-10-CM | POA: Diagnosis not present

## 2023-04-11 DIAGNOSIS — F4001 Agoraphobia with panic disorder: Secondary | ICD-10-CM

## 2023-04-11 DIAGNOSIS — T50905A Adverse effect of unspecified drugs, medicaments and biological substances, initial encounter: Secondary | ICD-10-CM

## 2023-04-11 DIAGNOSIS — G4710 Hypersomnia, unspecified: Secondary | ICD-10-CM

## 2023-04-11 DIAGNOSIS — F411 Generalized anxiety disorder: Secondary | ICD-10-CM | POA: Diagnosis not present

## 2023-04-11 MED ORDER — ARIPIPRAZOLE 5 MG PO TABS
5.0000 mg | ORAL_TABLET | Freq: Every day | ORAL | 1 refills | Status: DC
Start: 2023-04-11 — End: 2023-10-12

## 2023-04-11 MED ORDER — SERTRALINE HCL 100 MG PO TABS: 150.0000 mg | ORAL_TABLET | Freq: Every day | ORAL | 1 refills | Status: DC

## 2023-04-11 NOTE — Progress Notes (Signed)
Jeremy [name] [id number] 1955-11-25 67 y.o.     Subjective:   Patient ID:  Jeremy [name] is a 67 y.o. (DOB 05-12-1956) male.  Chief Complaint:  Chief Complaint  Patient presents with   Follow-up   Depression   Anxiety    Depression        Associated symptoms include no decreased concentration and no suicidal ideas.  Past medical history includes anxiety.   Anxiety Patient reports no chest pain, confusion, decreased concentration, nervous/anxious behavior, palpitations or suicidal ideas.      Jeremy [name] presents  today for follow-up of anxiety and depression.   He had been out on disability due to his back since September at that time.  seen April 2020.  Since retirement he has been under less stress and had asked to reduce and hopefully eliminate some medications.  We reduced Abilify from 7.5 to 5 mg daily.  As of December 04, 2019 the following is noted: Still good. nOTHing worse with dose reduction.   Benefit from meds. Help depression, anxiety and anger.  They are all under control. No mood swings nor prolonged depression.  Anxiety OK.  Wants to try to reduce meds again if possible bc less stress as noted. Plan because of sexual side effects with sertraline he prefers to reduce sertraline to 50 mg daily and continue Abilify 5 mg daily.  02/21/2020 appointment with the following noted: Doing well still after reducing sertraline to 50 mg daily but no improvement in sexual function.  depression and anxiety  Are still under control. No panic.   Plan:  We discussed the options of trying to eliminate gradually the Abilify versus reducing the sertraline which is probably more risky.  However he is having sexual side effects from sertraline and no side effects from Abilify.  He prefers to stop the sertraline and see if he can get by without it. Therefore reduce sertraline to 25 mg daily for 2 weeks then stop it  05/07/20 appt with the following noted: Off sertraline without more  depression or anxiety.  No panic.  No withdrawal effects. Sexual function is not better.  Erection problems. Tried Viagra without help.  Saw urologist and had shots which helped then stopped helping. He wants to try stopping Abilify to see if sexual function will return. Plan: Since retiring his stress level has reduced significantly.  His depression and anxiety are under control.  He wants to try to reduce medications again.   He wants to try DC Abilify to see if sexual function is better despite risk relapse. He's been fine so far off sertraline.    06/02/2020 phone call from patient:Pt called and said that he had a panic attack yesterday. He wants to know if he can go back on his medications MD response: Fairly recently discontinued sertraline 50 mg daily.  That is the best antipanic med.  Have him restart sertraline 25 mg daily for 7 days and then 50 mg daily.  Will take several weeks to help.  Hold off the Abilify bc that was for depression. 06/13/20 TC: Pt LM on VM needing apt today to see CC. Returned call, Pt stated he is still having issues with anxiety and needs to see CC today. His follow up apt is 12/13.   2 panic attacks but close today.  06/16/2020 urgent appointment with the following noted: Seen with wife Panic and wanted to restart meds.  Wife says he's more depressed.  Only sleeps and eats and  is like a walking zombie without meds. Lost father and stepmother and friend.  She thinks he's depressed.  B committed suicide years ago.   Now he thinks I'm going to leave him. Family notices his depression.  Having to help adopted father with cancer. He agrees with wife. Tense and SOB with anxiety. Wife thinks he represses things.  Mo left him in an orphanage and has abandonment issues even with her.  Thinking she will leave. Plan: Relapse worse off meds  Increase sertraline to 100 mg daily.  May need to go higher Increase Abilify to 10 mg daily for severe depression with paranoid  rumination. OK prn Xanax prn panic   08/04/20 appt with following noted:  Seen with wife, Fannie Knee Better with tension.  Feels less down too.   SE increased appetite.  But had recently lost 20 # and lately snacking more. Wife sees dramatic turnaround and he's more talkative and engaged and friends notice too.  It's working. No Xanax used.  Staying awake during day.   Not quite back to normal but really improved per wife.   Plan: If he is back to baseline at follow-up we will consider reducing the Abilify somewhat in order to reduce the weight gain potential if possible.  10/07/2020 appointment with following noted: Still sleeping too much and eating a lot.  Otherwise is OK.  Doesn't feel depressed.  Interest is a little better.  Not anxious.    Not aware of SE otherwise.  Not worrying too much.  Has retired.  Has reduced his stress significantly. Using CPAP. No panic. Plan: Continue sertraline 100 mg daily Reduce Abilify to 5 mg daily for the history of severe paranoid rumination and depression which are improved, but apparently it's sedating him.  12/11/2020 appointment with the following noted: Reduced Abilify to 5 mg daily without much change.   Mood been alright withuot depression.  Anxiety is not a problem. Sleep 9 hours at night and then naps in the morning after breakfast and then in afternoon.  Not much to do.  Not much he wants to do in retirement except golf and physical limitations hurt that. No other hobbies.     Weight stable.  He stopped the Abilify couple of weeks ago bc insurance told him they would not cover it and it would be $800 dollars. Plan:  Continue sertraline 100 mg daily He stopped Abilify 5 mg 2 weeks ago DT cost Disc risk relapse.  Call if relapse bc at risk for a couple of mos.  03/24/2021 appointment with the following noted:  Seen with daughter, Candise Bowens Patient stopped Abilify 2 weeks prior to last appointment due to being told it would be $800.  He wanted to stay  off the medication and was doing okay at the time of appointment. Taking sertraline 150 mg daily. Xanax makes him too sleepy. Alright.  A little bit of anxiety.  Worrying about things with wife's problems with her brother's death.   Candise Bowens notices his anxiety is out of control.  He's constantly hovering her and obsessing she may leave him again as in the past.  Definitely not himself and family notices not interacting with people.  She's noticed changes worsening over the year. B in law died May 19, 2024making things worse. Sleep is interrupted.  He recognizes he's more fearful than normal. Is sad also. Pending back surgery 03/31/2021 Plan: Because he relapsed again off of Abilify we will do the following: Continue sertraline 150 mg daily Restart  Abilify 10 mg daily bc did best on it.  06/08/21 appt noted: Better with Abilify and SE hungry with weight gain of ? Amount. Rubs teeth front together at rest. No depression and anxiety now.  Patient reports stable mood and denies depressed or irritable moods.  Patient denies any recent difficulty with anxiety.  Patient denies difficulty with sleep initiation or maintenance. Denies appetite disturbance.  Patient reports that energy and motivation have been good.  Patient denies any difficulty with concentration.  Patient denies any suicidal ideation. Back pain not gone but better after surgery He remains on  Continue sertraline 150 mg daily and Abilify 10 Plan: Continue sertraline 150 mg daily Continue Abilify but we will reduce to 5 mg bc hunger and rubbing teeth together may be SE More paranoid off Abilify.  09/08/2021 appointment noted: Been doing good since here after reduction to Abilify 5 mg daily.  Thinks Abilify SE hunger and weight gain. Rubbing teeth together stopped with reduction in dose. Relapsed  Continues sertraline 150 mg daily. Plan: Continue sertraline 150 mg daily Continue Abilify 5 mg  bc hunger and rubbing teeth together may be  SE More paranoid off Abilify.  12/08/2021 appointment with the following noted: Back surgery August and it still bothers him to walk.  No exercise.  No weight loss. Good with depression.  F in law died last night with complications of CA and hip fx.  He was good man.   Anxiety also been ok without fear or paranoia. SE occ diarrhea and nothing else. Sleep is good.   Wife Fannie Knee with problems psych dealing with brother's death and F's cancer. Plan: Increase metoformin to 1000 mg BID and if no benefit will stop it.  No benefit at 500 mg BID Continue sertraline 150 mg daily. Continue Abilify 5 mg daily because he was paranoid off of it  03/10/2022 appointment with the following noted: Had too much nausea on the higher dose of metformin and stopped it Wanted to try topiramate to see if it helped with his hunger problems and started 25 mg twice daily to then increase to 50 mg twice daily With topiramate lost a couple of # but seemed it quit working.  No SE with it. Mood been alright I guess.  Not depressed and no panic. No fear.  Sleep is good. Wants to lose mor eweight if possible. Plan: Increase topiramate to 75 mg BID for wt control and if NR after a couple of weeks then can increase to 100 mg BID  06/10/22 appt noted: Not losing wt with topiramate but no scales at home.   No exercise bc of back pain. Some reduction in appetite.   SE mild diarrhea but less than metformin. Current psych meds: topiramate 100 mg BID, sertraline 150 mg,  Abilify 5 mg daily Depression is pretty good.  No panic.  Anxiety level is OK. No other med problems except back and no history kidney stones. Cut out bread and potatoes. Plan: Increase topiramate to 150 mg BID for wt control bc needs to lose wt DT back pain  10/11/22 appt noted: No difference with increase topiramate 150 mg BID,  no benefit with that med.   Current others: sertraliline and Abilify Pretty good mood.  Not depressed.  Anxiety is OK too.  No panic.    SE non unless wt issues. Sleep is good.  Needs another back surgery.  2nd opinion.    Needs 2 surgeries.   Plan: No benefit with topiramate so  wean it over several weeks or slower if anxious.  Reduce topiramate to 2 twice daily for 2 weeks then 1 twice daily for 2 weeks, then 1 daily for 1 week, then stop it.  04/11/23 appt noted: Psych meds Abilify 5 mg daily, sertraline 50 mg daily No problems off the topiramate.   Conssitent with meds.  No SE Not dep and doing well.  No panic or sig anxiety.  No fear.  No crying spells. Another back surgery in July but not better yet.  I'm lazy and need and get out and walk.  Realizes it.  First back surgery did the same thing, "just lazy".   Had 3 back surgeries with first a failure.  Brief anger spell quickly resolved.  Past history of psychiatric disability. Not sure how many episodes of depression he's had in life.  Historically about the same levels of depression and anxiety   Past Psychiatric Medication Trials: Sertraline 100 Abilify 10 helped SE weight gain Xanax 0.5 Metformin N Topiramate for wt NR  Review of Systems:  Review of Systems  Cardiovascular:  Negative for chest pain and palpitations.  Genitourinary:        ED persistent  Musculoskeletal:  Positive for arthralgias and back pain.  Neurological:  Negative for tremors.  Psychiatric/Behavioral:  Negative for agitation, behavioral problems, confusion, decreased concentration, dysphoric mood, hallucinations, self-injury, sleep disturbance and suicidal ideas. The patient is not nervous/anxious and is not hyperactive.     Medications: I have reviewed the patient's current medications.  Current Outpatient Medications  Medication Sig Dispense Refill   amLODipine-benazepril (LOTREL) 10-40 MG capsule Take 1 capsule by mouth daily. 90 capsule 0   ascorbic acid (VITAMIN C) 500 MG tablet Take by mouth.     aspirin EC 81 MG tablet Take 1 tablet (81 mg total) by mouth daily. Swallow  whole. 90 tablet 3   b complex vitamins tablet Take 1 tablet by mouth daily.     ferrous sulfate 325 (65 FE) MG tablet Take 1 tablet (325 mg total) by mouth 2 (two) times daily with a meal. 60 tablet 0   fexofenadine (ALLEGRA) 180 MG tablet Take 1 tablet (180 mg total) by mouth daily. 30 tablet 0   furosemide (LASIX) 20 MG tablet Take 1 tablet by mouth once daily 90 tablet 0   hydrochlorothiazide (HYDRODIURIL) 25 MG tablet TAKE 1 TABLET(25 MG) BY MOUTH DAILY 90 tablet 1   metoprolol succinate (TOPROL-XL) 50 MG 24 hr tablet Take one tablet daily (50MG ) with or immediately following a meal. 90 tablet 1   metoprolol tartrate (LOPRESSOR) 100 MG tablet Take 2 hours prior to CT scan 1 tablet 0   Multiple Vitamin (MULTIVITAMIN WITH MINERALS) TABS tablet Take 1 tablet by mouth daily.     omeprazole (PRILOSEC) 20 MG capsule Take 1 capsule (20 mg total) by mouth daily. 30 capsule 3   potassium chloride (KLOR-CON M) 10 MEQ tablet Take 2 tablets by mouth in the AM and 1 tablet by mouth in the PM 270 tablet 1   rosuvastatin (CRESTOR) 40 MG tablet Take 1 tablet (40 mg total) by mouth daily. 90 tablet 3   ARIPiprazole (ABILIFY) 5 MG tablet Take 1 tablet (5 mg total) by mouth daily. 90 tablet 1   sertraline (ZOLOFT) 100 MG tablet Take 1.5 tablets (150 mg total) by mouth daily. 135 tablet 1   No current facility-administered medications for this visit.    Medication Side Effects: ED with Zoloft.  Viagra failed.  Using shots.  Allergies: No Known Allergies  Past Medical History:  Diagnosis Date   Anemia 11/16/2014   Anxiety    Aortic aneurysm (HCC)    4.1 cm 2019, ascending aorta   Arthritis    Bladder disease    "an unusual bladder disease; don't know what it's called" (06/30/2015)   Bulging lumbar disc    Cataract    COVID-19 01/09/2021   Depression    GERD (gastroesophageal reflux disease)    History of hiatal hernia    Hyperglycemia 11/16/2014   Hyperlipemia    Hypertension    Joint pain,  knee    Migraine    06/30/2015 "maybe 2-3 times/year; not as bad as I used to have them"   OSA (obstructive sleep apnea)    Sleep apnea     Family History  Problem Relation Age of Onset   Stroke Mother 38       Died of CVA   Hypertension Mother    Anesthesia problems Neg Hx    Hypotension Neg Hx    Malignant hyperthermia Neg Hx    Pseudochol deficiency Neg Hx     Social History   Socioeconomic History   Marital status: Married    Spouse name: Not on file   Number of children: 3   Years of education: Not on file   Highest education level: GED or equivalent  Occupational History   Not on file  Tobacco Use   Smoking status: Former    Current packs/day: 0.00    Average packs/day: 1.5 packs/day for 6.0 years (9.0 ttl pk-yrs)    Types: Cigarettes    Start date: 12/01/1970    Quit date: 11/30/1976    Years since quitting: 46.3   Smokeless tobacco: Never   Tobacco comments:    "stopped smoking in ~ 1990"  Substance and Sexual Activity   Alcohol use: No    Alcohol/week: 0.0 standard drinks of alcohol   Drug use: No   Sexual activity: Yes  Other Topics Concern   Not on file  Social History Narrative   3 children    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 : Never true  Transportation Needs: Not on file (04/05/2023)  Physical Activity: Inactive (01/12/2023)   Exercise Vital Sign    Days of Exercise per Week: 0 days     Minutes of Exercise per Session: 0 min  Stress: No Stress Concern Present (01/12/2023)   [employer] of Occupational Health - Occupational Stress Questionnaire    Feeling of Stress : Not at all  Social Connections: Socially Integrated (01/12/2023)   Social Connection and Isolation Panel [NHANES]    Frequency of Communication with Friends and Family: More than three times a week    Frequency of Social Gatherings with Friends and Family: More than three times a  week    Attends Religious Services: More than 4 times per year    Active Member of Clubs or Organizations: Yes    Attends Banker Meetings: More than 4 times per year    Marital Status: Married  Catering manager Violence: Not At Risk (01/19/2023)   Humiliation, Afraid, Rape, and Kick questionnaire    Fear of Current or Ex-Partner: No    Emotionally Abused: No    Physically Abused: No    Sexually Abused: No    Past Medical History, Surgical history, Social history, and Family history were reviewed and updated as appropriate.   Please see review of systems for further details on the patient's review from today.   Objective:   Physical Exam:  There were no vitals taken for this visit.  Physical Exam Constitutional:      General: He is not in acute distress.    Appearance: He is well-developed.  Musculoskeletal:        General: No deformity.  Neurological:     Mental Status: He is alert and oriented to person, place, and time.     Motor: No tremor.     Coordination: Coordination normal.     Gait: Gait normal.  Psychiatric:        Attention and Perception: Attention and perception normal.        Mood and Affect: Mood is not anxious or depressed. Affect is not blunt or flat.        Speech: Speech normal.        Behavior: Behavior is not slowed.        Thought Content: Thought content is not paranoid or delusional. Thought content does not include homicidal or suicidal ideation. Thought content does not  include suicidal plan.        Cognition and Memory: Cognition normal.     Comments: Insight and judgment fair. No auditory or visual hallucinations. No AIM     Lab Review:     Component Value Date/Time   NA 140 01/24/2023 0905   K 4.0 01/24/2023 0905   CL 101 01/24/2023 0905   CO2 30 01/24/2023 0905   GLUCOSE 94 01/24/2023 0905   BUN 21 01/24/2023 0905   CREATININE 0.94 01/24/2023 0905   CREATININE 1.05 01/13/2018 1542   CALCIUM 9.6 01/24/2023 0905   PROT 6.9 01/24/2023 0905   ALBUMIN 4.4 01/24/2023 0905   AST 15 01/24/2023 0905   ALT 20 01/24/2023 0905   ALKPHOS 86 01/24/2023 0905   BILITOT 0.4 01/24/2023 0905   GFRNONAA >60 03/26/2021 1259   GFRAA >60 10/25/2017 0438       Component Value Date/Time   WBC 7.3 01/24/2023 0905   RBC 4.70 01/24/2023 0905   HGB 14.0 01/24/2023 0905   HCT 42.4 01/24/2023 0905   PLT 282.0 01/24/2023 0905   MCV 90.4 01/24/2023 0905   MCH 31.6 03/26/2021 1259   MCHC 32.9 01/24/2023 0905   RDW 14.0 01/24/2023 0905   LYMPHSABS 1.7 01/24/2023 0905   MONOABS 0.7 01/24/2023 0905   EOSABS 0.2 01/24/2023 0905   BASOSABS 0.0 01/24/2023 0905    No results found for: "POCLITH", "LITHIUM"   No results found for: "PHENYTOIN", "PHENOBARB", "VALPROATE", "CBMZ"   .res Assessment: Plan:    Generalized anxiety disorder - Plan: sertraline (ZOLOFT) 100 MG tablet  Severe recurrent major depression w/psychotic features, mood-congruent (HCC) - Plan: ARIPiprazole (ABILIFY) 5 MG tablet, sertraline (ZOLOFT) 100 MG tablet  Panic disorder with agoraphobia -- in remission -  Plan: sertraline (ZOLOFT) 100 MG tablet  Weight gain due to medication  Hypersomnia with sleep apnea Erectile dysfunction due to SSRI  30-minute face to face time with patient  and his daughterwas spent on counseling and coordination of care. We discussed Jorja Loa has a history of severe depression and panic disorder and anxiety that caused him to go out on disability in 2019. He relapsed  after retirement off meds. Relapsed again off Abilify in August 2022 and resolved on 10 mg again .  Ok now on 5 mg daily However he had weight gain .  No EPS now. Relapse worse off meds. He's much too inactive in retirement and disc this again.   Emphasized need to be more active in retirment otherwise health will decline including mental health.  Wife busy in wildlife rehab.   Disc getting b ack into therapy also  Continue sertraline 150 mg daily Continue Abilify 5 mg  bc hunger and rubbing teeth together may be SE More paranoid off Abilify. None current.  Disc risk. Disc Good RX  No problems off topiramate.  Discussed potential metabolic side effects associated with atypical antipsychotics, as well as potential risk for movement side effects. Advised pt to contact office if movement side effects occur.  Disc diet and exercise.      Follow-up 6  mos  Meredith Staggers, MD, DFAPA  Please see After Visit Summary for patient specific instructions.  Future Appointments  Date Time Provider Department Center  10/10/2023  9:30 AM Jetty Duhamel D, MD [facility] None    No orders of the defined types were placed in this encounter.      -------------------------------

## 2023-04-18 ENCOUNTER — Telehealth: Payer: Self-pay | Admitting: Family

## 2023-04-18 DIAGNOSIS — K219 Gastro-esophageal reflux disease without esophagitis: Secondary | ICD-10-CM

## 2023-04-18 NOTE — Telephone Encounter (Signed)
I placed a referral to [facility]. Please advise him that they are booking out 4-5 months though last time I checked.

## 2023-04-18 NOTE — Telephone Encounter (Signed)
Pt wants to know if [facility] has the ability to do an endoscopy ultrasound. He is currently supposed to go to [facility] but said he has a hard time getting to [place]. Please advise pt

## 2023-04-18 NOTE — Addendum Note (Signed)
Addended by: Sandford Craze on: 04/18/2023 04:26 PM   Modules accepted: Orders

## 2023-04-20 DIAGNOSIS — R931 Abnormal findings on diagnostic imaging of heart and coronary circulation: Secondary | ICD-10-CM | POA: Diagnosis not present

## 2023-04-20 NOTE — Telephone Encounter (Signed)
Called a few times but no answer, left detailed message with the referral information

## 2023-04-21 ENCOUNTER — Other Ambulatory Visit: Payer: Self-pay | Admitting: *Deleted

## 2023-04-21 DIAGNOSIS — E785 Hyperlipidemia, unspecified: Secondary | ICD-10-CM

## 2023-04-21 MED ORDER — EZETIMIBE 10 MG PO TABS
10.0000 mg | ORAL_TABLET | Freq: Every day | ORAL | 3 refills | Status: DC
Start: 2023-04-21 — End: 2024-03-23

## 2023-04-27 DIAGNOSIS — M4326 Fusion of spine, lumbar region: Secondary | ICD-10-CM | POA: Diagnosis not present

## 2023-05-02 ENCOUNTER — Other Ambulatory Visit: Payer: Self-pay | Admitting: *Deleted

## 2023-05-02 DIAGNOSIS — E785 Hyperlipidemia, unspecified: Secondary | ICD-10-CM

## 2023-05-09 ENCOUNTER — Other Ambulatory Visit: Payer: Self-pay | Admitting: Family

## 2023-05-13 ENCOUNTER — Telehealth: Payer: Self-pay | Admitting: Internal Medicine

## 2023-05-13 NOTE — Telephone Encounter (Signed)
Good morning,  Supervising Provider: 05/13/23   We received a referral for patient to be evaluated for Gerd symptoms. He does have HI history with [facility] and had an EUS\EGD in 2021. The patient is requesting a transfer due to location. Records were obtained for you to review and advise. Thanks

## 2023-05-16 DIAGNOSIS — H35033 Hypertensive retinopathy, bilateral: Secondary | ICD-10-CM | POA: Diagnosis not present

## 2023-05-16 DIAGNOSIS — H3561 Retinal hemorrhage, right eye: Secondary | ICD-10-CM | POA: Diagnosis not present

## 2023-05-16 DIAGNOSIS — H35022 Exudative retinopathy, left eye: Secondary | ICD-10-CM | POA: Diagnosis not present

## 2023-05-16 NOTE — Telephone Encounter (Signed)
Reviewed records. I would recommend that this patient be scheduled for an OV with Dr. 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 Firm 1 cm nodule at the gastric cardia that was biopsied (favor atypical leiomyoma). Normal duodenum. Pancreas parenchyma appeared heterogeneous without masses, cysts, or changes of chronic pancreatitis.  Path: Benign gastric mucosa and submucosa. No malignancy. Repeat EUS recommended in 2-3 years  Colonoscopy 02/22/22: 5 mm sessile polyp in the sigmoid colon; completely removed en bloc by cold snare and retrieved specimen Medium Internal hemorrhoids were seen on rectal retroflection. Final Pathologic Diagnosis  TISSUE LABELED "SIGMOID POLYP", BIOPSY: HYPERPLASTIC POLYP.

## 2023-05-30 DIAGNOSIS — M4326 Fusion of spine, lumbar region: Secondary | ICD-10-CM | POA: Diagnosis not present

## 2023-05-30 DIAGNOSIS — Z6841 Body Mass Index (BMI) 40.0 and over, adult: Secondary | ICD-10-CM | POA: Diagnosis not present

## 2023-05-30 DIAGNOSIS — M960 Pseudarthrosis after fusion or arthrodesis: Secondary | ICD-10-CM | POA: Diagnosis not present

## 2023-06-01 ENCOUNTER — Other Ambulatory Visit: Payer: Self-pay | Admitting: Family

## 2023-06-15 ENCOUNTER — Ambulatory Visit (INDEPENDENT_AMBULATORY_CARE_PROVIDER_SITE_OTHER): Payer: Medicare Other | Admitting: Family

## 2023-06-15 ENCOUNTER — Ambulatory Visit (HOSPITAL_BASED_OUTPATIENT_CLINIC_OR_DEPARTMENT_OTHER)
Admission: RE | Admit: 2023-06-15 | Discharge: 2023-06-15 | Disposition: A | Discharge status: Home / Self Care | Payer: Medicare Other | Source: Ambulatory Visit | Attending: Family | Admitting: Family

## 2023-06-15 VITALS — BP 127/64 | HR 69 | Temp 97.7°F | Resp 20 | Wt 268.0 lb

## 2023-06-15 DIAGNOSIS — R0989 Other specified symptoms and signs involving the circulatory and respiratory systems: Secondary | ICD-10-CM | POA: Diagnosis not present

## 2023-06-15 DIAGNOSIS — R062 Wheezing: Secondary | ICD-10-CM | POA: Diagnosis not present

## 2023-06-15 DIAGNOSIS — J069 Acute upper respiratory infection, unspecified: Secondary | ICD-10-CM | POA: Insufficient documentation

## 2023-06-15 MED ORDER — ALBUTEROL SULFATE HFA 108 (90 BASE) MCG/ACT IN AERS: 2.0000 | INHALATION_SPRAY | Freq: Four times a day (QID) | RESPIRATORY_TRACT | 0 refills | Status: DC | PRN

## 2023-06-15 MED ORDER — PREDNISONE 10 MG PO TABS: ORAL_TABLET | ORAL | 0 refills | Status: DC

## 2023-06-15 MED ORDER — BENZONATATE 100 MG PO CAPS: 100.0000 mg | ORAL_CAPSULE | Freq: Three times a day (TID) | ORAL | 0 refills | Status: DC | PRN

## 2023-06-15 NOTE — Progress Notes (Signed)
Subjective:     [name], male    DOB: [birth year], 67 y.o.   MRN: [id number]  Chief Complaint  Patient presents with   Cough    Patient complains of cough for 5 days, negative covid test today   Wheezing    Complains of wheezing    Cough Associated symptoms include wheezing. Pertinent negatives include no chest pain, chills, fever, headaches or rash.  Wheezing  Associated symptoms include coughing. Pertinent negatives include no chest pain, chills, fever, headaches, rash or vomiting.    Discussed the use of AI scribe software for clinical note transcription with the patient, who gave verbal consent to proceed.   Patient is a 67 year old male that presents to the clinic today with cough and cold symptoms. Coughing with mucus production. Patient states that the mucus is green. Noted wheezing as well. Patient states that this started Friday. Patient states that nothing makes the coughing worse that he is aware of.   Denies any fever  Patient states that he took covid test but was negative. And denies being around anyone that is sick.   Been taking Mucinex to help with congestion but states it is not helping. No cough syrup but taking cough drops to help.   Paitent states is trying to stay hydrated. No changes in his appitite since being sick.        Health Maintenance Due  Topic Date Due   Colonoscopy  09/23/2018   Pneumonia Vaccine 19+ Years old (2 of 2 - PCV) 03/17/2021   INFLUENZA VACCINE  03/24/2023   COVID-19 Vaccine (6 - 2023-24 season) 04/24/2023    Past Medical History:  Diagnosis Date   Anemia 11/16/2014   Anxiety    Aortic aneurysm (HCC)    4.1 cm 2019, ascending aorta   Arthritis    Bladder disease    "an unusual bladder disease; don't know what it's called" (06/30/2015)   Bulging lumbar disc    Cataract    COVID-19 01/09/2021   Depression    GERD (gastroesophageal reflux disease)    History of hiatal hernia    Hyperglycemia  11/16/2014   Hyperlipemia    Hypertension    Joint pain, knee    Migraine    06/30/2015 "maybe 2-3 times/year; not as bad as I used to have them"   OSA (obstructive sleep apnea)    Sleep apnea     Past Surgical History:  Procedure Laterality Date   ESOPHAGOGASTRODUODENOSCOPY     ESOPHAGOGASTRODUODENOSCOPY (EGD) WITH ESOPHAGEAL DILATION  ~ 2014   JOINT REPLACEMENT     right knee   KNEE ARTHROSCOPY Right 08/23/1993   NASAL SEPTUM SURGERY  ~ 1972   SPINE SURGERY     TOTAL KNEE ARTHROPLASTY  10/11/2011   Procedure: TOTAL KNEE ARTHROPLASTY;  Surgeon: Raymon Mutton, MD;  Location: [facility];  Service: Orthopedics;  Laterality: Right;   TRANSFORAMINAL LUMBAR INTERBODY FUSION W/ MIS 1 LEVEL Right 03/31/2021   Procedure: Right Lumbar five Sacral one Minimally invasive transforaminal lumbar interbody fusion;  Surgeon: Jadene Pierini, MD;  Location: [facility] OR;  Service: Neurosurgery;  Laterality: Right;   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)  ~2014    Family History  Problem Relation Age of Onset   Stroke Mother 74       Died of CVA   Hypertension Mother    Anesthesia problems Neg Hx    Hypotension Neg Hx    Malignant  hyperthermia Neg Hx    Pseudochol deficiency Neg Hx     Social History   Socioeconomic History   Marital status: Married    Spouse name: Not on file   Number of children: 3   Years of education: Not on file   Highest education level: GED or equivalent  Occupational History   Not on file  Tobacco Use   Smoking status: Former    Current packs/day: 0.00    Average packs/day: 1.5 packs/day for 6.0 years (9.0 ttl pk-yrs)    Types: Cigarettes    Start date: 12/01/1970    Quit date: 11/30/1976    Years since quitting: 46.5   Smokeless tobacco: Never   Tobacco comments:    "stopped smoking in ~ 1990"  Substance and Sexual Activity   Alcohol use: No    Alcohol/week: 0.0 standard drinks of alcohol   Drug use: No   Sexual activity: Yes   Other Topics Concern   Not on file  Social History Narrative   3 children    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   Completed 12th grade   Enjoys softball   Social Determinants of Health   Financial Resource Strain: Low Risk  (01/12/2023)   Overall Financial Resource Strain (CARDIA)    Difficulty of Paying Living Expenses: Not hard at all  Food Insecurity: Low Risk  (04/05/2023)   Received from [facility]   Hunger Vital Sign    Worried About Running Out of Food in the Last Year: Never true    Ran Out of Food in the Last Year: Never true  Transportation Needs: Not on file (04/05/2023)  Physical Activity: Inactive (01/12/2023)   Exercise Vital Sign    Days of Exercise per Week: 0 days    Minutes of Exercise per Session: 0 min  Stress: No Stress Concern Present (01/12/2023)   [employer] of Occupational Health - Occupational Stress Questionnaire    Feeling of Stress : Not at all  Social Connections: Socially Integrated (01/12/2023)   Social Connection and Isolation Panel [NHANES]    Frequency of Communication with Friends and Family: More than three times a week    Frequency of Social Gatherings with Friends and Family: More than three times a week    Attends Religious Services: More than 4 times per year    Active Member of [employer] or Organizations: Yes    Attends Engineer, structural: More than 4 times per year    Marital Status: Married  Catering manager Violence: Not At Risk (01/19/2023)   Humiliation, Afraid, Rape, and Kick questionnaire    Fear of Current or Ex-Partner: No    Emotionally Abused: No    Physically Abused: No    Sexually Abused: No    Outpatient Medications Prior to Visit  Medication Sig Dispense Refill   amLODipine-benazepril (LOTREL) 10-40 MG capsule Take 1 capsule by mouth daily. 90 capsule 0   ARIPiprazole (ABILIFY) 5 MG tablet Take 1 tablet (5 mg total) by mouth daily. 90 tablet 1   ascorbic acid (VITAMIN C) 500  MG tablet Take by mouth.     aspirin EC 81 MG tablet Take 1 tablet (81 mg total) by mouth daily. Swallow whole. 90 tablet 3   b complex vitamins tablet Take 1 tablet by mouth daily.     ezetimibe (ZETIA) 10 MG tablet Take 1 tablet (10 mg total) by mouth daily. 90 tablet 3   ferrous sulfate  325 (65 FE) MG tablet Take 1 tablet (325 mg total) by mouth 2 (two) times daily with a meal. 60 tablet 0   fexofenadine (ALLEGRA) 180 MG tablet Take 1 tablet (180 mg total) by mouth daily. 30 tablet 0   furosemide (LASIX) 20 MG tablet Take 1 tablet by mouth once daily 90 tablet 0   hydrochlorothiazide (HYDRODIURIL) 25 MG tablet TAKE 1 TABLET(25 MG) BY MOUTH DAILY 90 tablet 1   metoprolol succinate (TOPROL-XL) 50 MG 24 hr tablet Take one tablet daily (50MG ) with or immediately following a meal. 90 tablet 1   metoprolol tartrate (LOPRESSOR) 100 MG tablet Take 2 hours prior to CT scan 1 tablet 0   Multiple Vitamin (MULTIVITAMIN WITH MINERALS) TABS tablet Take 1 tablet by mouth daily.     omeprazole (PRILOSEC) 20 MG capsule Take 1 capsule (20 mg total) by mouth daily. 30 capsule 3   potassium chloride (KLOR-CON M) 10 MEQ tablet TAKE 2 TABLETS BY MOUTH IN THE MORNING AND 1 TABLET IN THE EVENING 270 tablet 0   sertraline (ZOLOFT) 100 MG tablet Take 1.5 tablets (150 mg total) by mouth daily. 135 tablet 1   rosuvastatin (CRESTOR) 40 MG tablet Take 1 tablet (40 mg total) by mouth daily. 90 tablet 3   No facility-administered medications prior to visit.    No Known Allergies  Review of Systems  Constitutional:  Negative for chills and fever.  HENT:  Positive for congestion.   Eyes:  Negative for pain.  Respiratory:  Positive for cough and wheezing.   Cardiovascular:  Negative for chest pain and palpitations.  Gastrointestinal:  Negative for nausea and vomiting.  Genitourinary:  Negative for dysuria and frequency.  Musculoskeletal:  Negative for back pain.  Skin:  Negative for rash.  Neurological:  Negative for  dizziness and headaches.  Psychiatric/Behavioral:  Negative for depression.        Objective:    Physical Exam Constitutional:      Appearance: Normal appearance.  HENT:     Nose: Congestion present.     Mouth/Throat:     Mouth: Mucous membranes are moist.  Cardiovascular:     Rate and Rhythm: Normal rate and regular rhythm.     Pulses: Normal pulses.     Heart sounds: Normal heart sounds.  Pulmonary:     Effort: Pulmonary effort is normal.     Breath sounds: Wheezing (right lower lobe) present.  Musculoskeletal:        General: Normal range of motion.     Cervical back: Normal range of motion.  Skin:    General: Skin is warm.     Capillary Refill: Capillary refill takes less than 2 seconds.  Neurological:     General: No focal deficit present.     Mental Status: He is alert and oriented to person, place, and time. Mental status is at baseline.  Psychiatric:        Mood and Affect: Mood normal.        Behavior: Behavior normal.        Thought Content: Thought content normal.        Judgment: Judgment normal.      BP 127/64 (BP Location: Right Arm, Patient Position: Sitting, Cuff Size: Large)   Pulse 69   Temp 97.7 F (36.5 C) (Oral)   Resp 20   Wt 268 lb (121.6 kg)   SpO2 98%   BMI 43.26 kg/m  Wt Readings from Last 3 Encounters:  06/15/23 268 lb (  121.6 kg)  02/16/23 268 lb (121.6 kg)  01/24/23 265 lb (120.2 kg)       Assessment & Plan:   Problem List Items Addressed This Visit   None   I am having Decker S. Chay "Tim" maintain his b complex vitamins, multivitamin with minerals, ferrous sulfate, fexofenadine, omeprazole, ascorbic acid, hydrochlorothiazide, metoprolol succinate, metoprolol tartrate, aspirin EC, rosuvastatin, amLODipine-benazepril, ARIPiprazole, sertraline, ezetimibe, potassium chloride, and furosemide.  No orders of the defined types were placed in this encounter.

## 2023-06-15 NOTE — Assessment & Plan Note (Signed)
Obtain x-ray to rule out PNA.  Rx sent for prednisone taper, albuterol MDI and tessalon perles. Pt is advised to call if symptoms worsen or if symptoms are not improved in 3-4 days.

## 2023-06-15 NOTE — Patient Instructions (Signed)
Please start prednisone.  You may use albuterol for wheezing as needed and tessalon as needed for cough.

## 2023-06-15 NOTE — Progress Notes (Signed)
Subjective:     [name], male    DOB: 1955/08/26, 67 y.o.   MRN: [id number]  Chief Complaint  Patient presents with   Cough    Patient complains of cough for 5 days, negative covid test today   Wheezing    Complains of wheezing    Cough Associated symptoms include wheezing.  Wheezing  Associated symptoms include coughing.    Discussed the use of AI scribe software for clinical note transcription with the patient, who gave verbal consent to proceed.  History of Present Illness         Started feeling sick 5 days ago.  Had negative covid test at home this AM.  Denies fever.  +wheezing and + cough.  He has tried mucinex without improvement.  His cough is productive of green sputum.      Health Maintenance Due  Topic Date Due   Colonoscopy  09/23/2018   Pneumonia Vaccine 44+ Years old (2 of 2 - PCV) 03/17/2021    Past Medical History:  Diagnosis Date   Anemia 11/16/2014   Anxiety    Aortic aneurysm (HCC)    4.1 cm 2019, ascending aorta   Arthritis    Bladder disease    "an unusual bladder disease; don't know what it's called" (06/30/2015)   Bulging lumbar disc    Cataract    COVID-19 01/09/2021   Depression    GERD (gastroesophageal reflux disease)    History of hiatal hernia    Hyperglycemia 11/16/2014   Hyperlipemia    Hypertension    Joint pain, knee    Migraine    06/30/2015 "maybe 2-3 times/year; not as bad as I used to have them"   OSA (obstructive sleep apnea)    Sleep apnea     Past Surgical History:  Procedure Laterality Date   ESOPHAGOGASTRODUODENOSCOPY     ESOPHAGOGASTRODUODENOSCOPY (EGD) WITH ESOPHAGEAL DILATION  ~ 2014   JOINT REPLACEMENT     right knee   KNEE ARTHROSCOPY Right 08/23/1993   NASAL SEPTUM SURGERY  ~ 1972   SPINE SURGERY     TOTAL KNEE ARTHROPLASTY  10/11/2011   Procedure: TOTAL KNEE ARTHROPLASTY;  Surgeon: Raymon Mutton, MD;  Location: [facility];  Service: Orthopedics;  Laterality: Right;   TRANSFORAMINAL  LUMBAR INTERBODY FUSION W/ MIS 1 LEVEL Right 03/31/2021   Procedure: Right Lumbar five Sacral one Minimally invasive transforaminal lumbar interbody fusion;  Surgeon: Jadene Pierini, MD;  Location: [facility];  Service: Neurosurgery;  Laterality: Right;   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)  ~2014    Family History  Problem Relation Age of Onset   Stroke Mother 75       Died of CVA   Hypertension Mother    Anesthesia problems Neg Hx    Hypotension Neg Hx    Malignant hyperthermia Neg Hx    Pseudochol deficiency Neg Hx     Social History   Socioeconomic History   Marital status: Married    Spouse name: Not on file   Number of children: 3   Years of education: Not on file   Highest education level: GED or equivalent  Occupational History   Not on file  Tobacco Use   Smoking status: Former    Current packs/day: 0.00    Average packs/day: 1.5 packs/day for 6.0 years (9.0 ttl pk-yrs)    Types: Cigarettes    Start date: 12/01/1970    Quit date: 11/30/1976  Years since quitting: 46.5   Smokeless tobacco: Never   Tobacco comments:    "stopped smoking in ~ 1990"  Substance and Sexual Activity   Alcohol use: No    Alcohol/week: 0.0 standard drinks of alcohol   Drug use: No   Sexual activity: Yes  Other Topics Concern   Not on file  Social History Narrative   3 children    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   Completed 12th grade   Enjoys softball   Social Determinants of Health   Financial Resource Strain: Low Risk  (01/12/2023)   Overall Financial Resource Strain (CARDIA)    Difficulty of Paying Living Expenses: Not hard at all  Food Insecurity: Low Risk  (04/05/2023)   Received from [facility]   Hunger Vital Sign    Worried About Running Out of Food in the Last Year: Never true    Ran Out of Food in the Last Year: Never true  Transportation Needs: Not on file (04/05/2023)  Physical Activity: Inactive  (01/12/2023)   Exercise Vital Sign    Days of Exercise per Week: 0 days    Minutes of Exercise per Session: 0 min  Stress: No Stress Concern Present (01/12/2023)   [employer] of Occupational Health - Occupational Stress Questionnaire    Feeling of Stress : Not at all  Social Connections: Socially Integrated (01/12/2023)   Social Connection and Isolation Panel [NHANES]    Frequency of Communication with Friends and Family: More than three times a week    Frequency of Social Gatherings with Friends and Family: More than three times a week    Attends Religious Services: More than 4 times per year    Active Member of [employer] or Organizations: Yes    Attends Engineer, structural: More than 4 times per year    Marital Status: Married  Catering manager Violence: Not At Risk (01/19/2023)   Humiliation, Afraid, Rape, and Kick questionnaire    Fear of Current or Ex-Partner: No    Emotionally Abused: No    Physically Abused: No    Sexually Abused: No    Outpatient Medications Prior to Visit  Medication Sig Dispense Refill   amLODipine-benazepril (LOTREL) 10-40 MG capsule Take 1 capsule by mouth daily. 90 capsule 0   ARIPiprazole (ABILIFY) 5 MG tablet Take 1 tablet (5 mg total) by mouth daily. 90 tablet 1   ascorbic acid (VITAMIN C) 500 MG tablet Take by mouth.     aspirin EC 81 MG tablet Take 1 tablet (81 mg total) by mouth daily. Swallow whole. 90 tablet 3   b complex vitamins tablet Take 1 tablet by mouth daily.     ezetimibe (ZETIA) 10 MG tablet Take 1 tablet (10 mg total) by mouth daily. 90 tablet 3   ferrous sulfate 325 (65 FE) MG tablet Take 1 tablet (325 mg total) by mouth 2 (two) times daily with a meal. 60 tablet 0   fexofenadine (ALLEGRA) 180 MG tablet Take 1 tablet (180 mg total) by mouth daily. 30 tablet 0   furosemide (LASIX) 20 MG tablet Take 1 tablet by mouth once daily 90 tablet 0   hydrochlorothiazide (HYDRODIURIL) 25 MG tablet TAKE 1 TABLET(25 MG) BY MOUTH DAILY 90  tablet 1   metoprolol succinate (TOPROL-XL) 50 MG 24 hr tablet Take one tablet daily (50MG ) with or immediately following a meal. 90 tablet 1   metoprolol tartrate (LOPRESSOR) 100 MG tablet Take 2  hours prior to CT scan 1 tablet 0   Multiple Vitamin (MULTIVITAMIN WITH MINERALS) TABS tablet Take 1 tablet by mouth daily.     omeprazole (PRILOSEC) 20 MG capsule Take 1 capsule (20 mg total) by mouth daily. 30 capsule 3   potassium chloride (KLOR-CON M) 10 MEQ tablet TAKE 2 TABLETS BY MOUTH IN THE MORNING AND 1 TABLET IN THE EVENING 270 tablet 0   sertraline (ZOLOFT) 100 MG tablet Take 1.5 tablets (150 mg total) by mouth daily. 135 tablet 1   rosuvastatin (CRESTOR) 40 MG tablet Take 1 tablet (40 mg total) by mouth daily. 90 tablet 3   No facility-administered medications prior to visit.    No Known Allergies  Review of Systems  Respiratory:  Positive for cough and wheezing.    See HPI    Objective:    Physical Exam Constitutional:      General: He is not in acute distress.    Appearance: He is well-developed.  HENT:     Head: Normocephalic and atraumatic.     Right Ear: Tympanic membrane and ear canal normal.     Left Ear: Tympanic membrane and ear canal normal.     Mouth/Throat:     Mouth: Mucous membranes are moist.     Pharynx: No pharyngeal swelling, oropharyngeal exudate or posterior oropharyngeal erythema.  Cardiovascular:     Rate and Rhythm: Normal rate and regular rhythm.     Heart sounds: No murmur heard. Pulmonary:     Effort: Pulmonary effort is normal. No respiratory distress.     Breath sounds: Examination of the right-lower field reveals rales. Examination of the left-lower field reveals rales. Wheezing and rales present.  Musculoskeletal:     Cervical back: Neck supple.  Lymphadenopathy:     Cervical: No cervical adenopathy.  Skin:    General: Skin is warm and dry.  Neurological:     Mental Status: He is alert and oriented to person, place, and time.   Psychiatric:        Behavior: Behavior normal.        Thought Content: Thought content normal.      BP 127/64 (BP Location: Right Arm, Patient Position: Sitting, Cuff Size: Large)   Pulse 69   Temp 97.7 F (36.5 C) (Oral)   Resp 20   Wt 268 lb (121.6 kg)   SpO2 98%   BMI 43.26 kg/m  Wt Readings from Last 3 Encounters:  06/15/23 268 lb (121.6 kg)  02/16/23 268 lb (121.6 kg)  01/24/23 265 lb (120.2 kg)       Assessment & Plan:   Problem List Items Addressed This Visit       Unprioritized   Viral URI with cough - Primary    Obtain x-ray to rule out PNA.  Rx sent for prednisone taper, albuterol MDI and tessalon perles. Pt is advised to call if symptoms worsen or if symptoms are not improved in 3-4 days.       Relevant Medications   benzonatate (TESSALON) 100 MG capsule   predniSONE (DELTASONE) 10 MG tablet   Other Relevant Orders   DG Chest 2 View    I am having Khai S. Mickelson "Tim" start on benzonatate, predniSONE, and albuterol. I am also having him maintain his b complex vitamins, multivitamin with minerals, ferrous sulfate, fexofenadine, omeprazole, ascorbic acid, hydrochlorothiazide, metoprolol succinate, metoprolol tartrate, aspirin EC, rosuvastatin, amLODipine-benazepril, ARIPiprazole, sertraline, ezetimibe, potassium chloride, and furosemide.  Meds ordered this encounter  Medications  benzonatate (TESSALON) 100 MG capsule    Sig: Take 1 capsule (100 mg total) by mouth 3 (three) times daily as needed.    Dispense:  20 capsule    Refill:  0    Order Specific Question:   Supervising Provider    Answer:   Danise Edge A [[id number]]   predniSONE (DELTASONE) 10 MG tablet    Sig: 4 tabs by mouth once daily for 2 days, then 3 tabs daily x 2 days, then 2 tabs daily x 2 days, then 1 tab daily x 2 days    Dispense:  20 tablet    Refill:  0    Order Specific Question:   Supervising Provider    Answer:   Danise Edge A [[id number]]   albuterol (VENTOLIN HFA) 108 (90 Base)  MCG/ACT inhaler    Sig: Inhale 2 puffs into the lungs every 6 (six) hours as needed for wheezing or shortness of breath.    Dispense:  8 g    Refill:  0    Order Specific Question:   Supervising Provider    Answer:   Danise Edge A [[id number]]

## 2023-06-16 ENCOUNTER — Telehealth: Payer: Self-pay | Admitting: Family

## 2023-06-16 NOTE — Telephone Encounter (Signed)
Pt called to discuss his x-ray results with nurse. Please call and advise pt.

## 2023-06-17 NOTE — Telephone Encounter (Signed)
MyChart message was sent to patient yesterday by pcp, patient reviewed message.

## 2023-07-02 ENCOUNTER — Other Ambulatory Visit: Payer: Self-pay | Admitting: Family

## 2023-07-04 ENCOUNTER — Other Ambulatory Visit: Payer: Self-pay | Admitting: Family

## 2023-07-05 ENCOUNTER — Telehealth: Payer: Self-pay | Admitting: Family

## 2023-07-05 NOTE — Telephone Encounter (Signed)
Prescription Request  07/05/2023  Is this a "Controlled Substance" medicine? No  LOV: 06/15/2023  What is the name of the medication or equipment? metoprolol tartrate (LOPRESSOR) 100 MG tablet  amLODipine-benazepril (LOTREL) 10-40 MG capsule  Have you contacted your pharmacy to request a refill? No   Which pharmacy would you like this sent to?  [id number] - [place], [place] - [address] [place] [address] Phone: [phone] Fax: [phone]    Patient notified that their request is being sent to the clinical staff for review and that they should receive a response within 2 business days.   Please advise at [facility] [phone]

## 2023-07-06 LAB — HEPATIC FUNCTION PANEL
ALT: 23 [IU]/L (ref 0–44)
AST: 19 [IU]/L (ref 0–40)
Albumin: 4 g/dL (ref 3.9–4.9)
Alkaline Phosphatase: 112 [IU]/L (ref 44–121)
Bilirubin Total: 0.2 mg/dL (ref 0.0–1.2)
Bilirubin, Direct: 0.12 mg/dL (ref 0.00–0.40)
Total Protein: 6.2 g/dL (ref 6.0–8.5)

## 2023-07-06 LAB — LIPID PANEL
Chol/HDL Ratio: 3.3 {ratio} (ref 0.0–5.0)
Cholesterol, Total: 119 mg/dL (ref 100–199)
HDL: 36 mg/dL — ABNORMAL LOW (ref >39)
LDL Chol Calc (NIH): 51 mg/dL (ref 0–99)
Triglycerides: 196 mg/dL — ABNORMAL HIGH (ref 0–149)
VLDL Cholesterol Cal: 32 mg/dL (ref 5–40)

## 2023-07-07 ENCOUNTER — Other Ambulatory Visit: Payer: Self-pay

## 2023-07-07 MED ORDER — METOPROLOL TARTRATE 100 MG PO TABS: ORAL_TABLET | ORAL | 0 refills | Status: DC

## 2023-07-07 MED ORDER — AMLODIPINE BESY-BENAZEPRIL HCL 10-40 MG PO CAPS: 1.0000 | ORAL_CAPSULE | Freq: Every day | ORAL | 0 refills | Status: DC

## 2023-07-07 MED ORDER — METOPROLOL SUCCINATE ER 50 MG PO TB24: ORAL_TABLET | ORAL | 1 refills | Status: DC

## 2023-07-07 NOTE — Telephone Encounter (Signed)
Prescriptions sent

## 2023-07-07 NOTE — Addendum Note (Signed)
Addended by: Sandford Craze on: 07/07/2023 03:22 PM   Modules accepted: Orders

## 2023-07-07 NOTE — Progress Notes (Signed)
Reviewed order.  100mg  metoprolol was incorrect (this was an old rx to be given prior to CT scan x 1).  Pt is actually taking toprol xl 50mg  daily. I confirmed with the patient.  Left voicemail at pharmacy to cancel the 100mg  as I couldn't get anyone to pick up the phone.

## 2023-08-04 ENCOUNTER — Other Ambulatory Visit: Payer: Self-pay | Admitting: Family

## 2023-08-04 ENCOUNTER — Other Ambulatory Visit: Payer: Self-pay

## 2023-08-04 MED ORDER — POTASSIUM CHLORIDE CRYS ER 10 MEQ PO TBCR: EXTENDED_RELEASE_TABLET | ORAL | 0 refills | Status: DC

## 2023-08-30 ENCOUNTER — Other Ambulatory Visit: Payer: Self-pay | Admitting: Family

## 2023-08-31 ENCOUNTER — Encounter: Payer: Self-pay | Admitting: Gastroenterology

## 2023-08-31 ENCOUNTER — Ambulatory Visit: Payer: Medicare Other | Admitting: Gastroenterology

## 2023-08-31 ENCOUNTER — Other Ambulatory Visit (INDEPENDENT_AMBULATORY_CARE_PROVIDER_SITE_OTHER): Payer: Medicare Other

## 2023-08-31 VITALS — BP 114/70 | HR 66 | Ht 66.0 in | Wt 269.1 lb

## 2023-08-31 DIAGNOSIS — K649 Unspecified hemorrhoids: Secondary | ICD-10-CM | POA: Diagnosis not present

## 2023-08-31 DIAGNOSIS — Z860101 Personal history of adenomatous and serrated colon polyps: Secondary | ICD-10-CM

## 2023-08-31 DIAGNOSIS — K3189 Other diseases of stomach and duodenum: Secondary | ICD-10-CM | POA: Diagnosis not present

## 2023-08-31 DIAGNOSIS — Z862 Personal history of diseases of the blood and blood-forming organs and certain disorders involving the immune mechanism: Secondary | ICD-10-CM | POA: Diagnosis not present

## 2023-08-31 DIAGNOSIS — Z860100 Personal history of colon polyps, unspecified: Secondary | ICD-10-CM | POA: Diagnosis not present

## 2023-08-31 DIAGNOSIS — R1310 Dysphagia, unspecified: Secondary | ICD-10-CM

## 2023-08-31 DIAGNOSIS — R1319 Other dysphagia: Secondary | ICD-10-CM | POA: Insufficient documentation

## 2023-08-31 LAB — IBC + FERRITIN
Ferritin: 34.4 ng/mL (ref 22.0–322.0)
Iron: 72 ug/dL (ref 42–165)
Saturation Ratios: 16.8 % — ABNORMAL LOW (ref 20.0–50.0)
TIBC: 428.4 ug/dL (ref 250.0–450.0)
Transferrin: 306 mg/dL (ref 212.0–360.0)

## 2023-08-31 LAB — CBC
HCT: 41.1 % (ref 39.0–52.0)
Hemoglobin: 13.8 g/dL (ref 13.0–17.0)
MCHC: 33.6 g/dL (ref 30.0–36.0)
MCV: 84.2 fL (ref 78.0–100.0)
Platelets: 279 10*3/uL (ref 150.0–400.0)
RBC: 4.88 Mil/uL (ref 4.22–5.81)
RDW: 15.7 % — ABNORMAL HIGH (ref 11.5–15.5)
WBC: 7.9 10*3/uL (ref 4.0–10.5)

## 2023-08-31 LAB — VITAMIN B12: Vitamin B-12: 721 pg/mL (ref 211–911)

## 2023-08-31 LAB — FOLATE: Folate: >25.2 ng/mL (ref >5.9)

## 2023-08-31 NOTE — Patient Instructions (Signed)
 Your provider has requested that you go to the basement level for lab work before leaving today. Press "B" on the elevator. The lab is located at the first door on the left as you exit the elevator.  You have been scheduled for an endoscopy. Please follow written instructions given to you at your visit today.  If you use inhalers (even only as needed), please bring them with you on the day of your procedure.  If you take any of the following medications, they will need to be adjusted prior to your procedure:   DO NOT TAKE 7 DAYS PRIOR TO TEST- Trulicity (dulaglutide) Ozempic, Wegovy (semaglutide) Mounjaro (tirzepatide) Bydureon Bcise (exanatide extended release)  DO NOT TAKE 1 DAY PRIOR TO YOUR TEST Rybelsus (semaglutide) Adlyxin (lixisenatide) Victoza (liraglutide) Byetta (exanatide) ____________________________________________________________  Due to recent changes in healthcare laws, you may see the results of your imaging and laboratory studies on MyChart before your provider has had a chance to review them.  We understand that in some cases there may be results that are confusing or concerning to you. Not all laboratory results come back in the same time frame and the provider may be waiting for multiple results in order to interpret others.  Please give [place] 48 hours in order for your provider to thoroughly review all the results before contacting the office for clarification of your results.   Thank you for choosing me and [facility].  Dr. Meridee Score

## 2023-08-31 NOTE — Progress Notes (Signed)
 GASTROENTEROLOGY OUTPATIENT CLINIC VISIT   Primary Care Provider Daryl Setter, NP [address] [place] [address] [phone]  Referring Provider Daryl Setter, NP [address] [place],  [place] [address] [phone]  Patient Profile: Jeremy [name] is a 68 y.o. male with a pmh significant for Hypertension, hyperlipidemia, migraines, cataracts, MDD, AAA, GERD,  gastric cardia nodule (followed by EUS), hyperplastic colon polyps hemorrhoids.  The patient presents to the [facility] for an evaluation and management of problem(s) noted below:  Problem List 1. Gastric nodule   2. Liquid dysphagia   3. History of anemia   4. Hemorrhoids, unspecified hemorrhoid type    Discussed the use of AI scribe software for clinical note transcription with the patient, who gave verbal consent to proceed.  History of Present Illness This is the patient's first visit to the [facility] GI clinic.  The patient, a retired engineer, water, presents with a history of previous gastric nodule (suspected to be a leiomyoma - by Dr. Missie of [facility] over serial EUS over the last 10-years).  He also reports intermittent difficulty swallowing liquids, occurring a few times a week, but denies any issues with swallowing solid foods regularly.  The patient describes the sensation as a hindrance to swallowing but denies any associated chest pain. The issue resolves without the need for regurgitation of liquids or foodstuffs.  In addition, the patient reports a longstanding issue with intermittent rectal bleeding, occurring approximately every other week, which he attributes to hemorrhoids. The bleeding tends to resolve on its own and does not cause discomfort unless the patient is using the bathroom. The patient denies any rapid, unintentional weight loss and reports regular, formed bowel movements once a day. He is currently attempting to lose weight  through regular exercise.  The patient's last set of laboratories, conducted in April of the previous year, showed a slight anemia. The patient is now overdue for his Upper EUS.   GI Review of Systems Positive as above Negative for abdominal pain, bloating, melena  Review of Systems General: Denies fevers/chills/weight loss unintentionally Cardiovascular: Denies chest pain Pulmonary: Denies shortness of breath Gastroenterological: See HPI Genitourinary: Denies darkened urine Hematological: Denies easy bruising/bleeding Dermatological: Denies jaundice Psychological: Mood is stable   Medications Current Outpatient Medications  Medication Sig Dispense Refill   albuterol  (VENTOLIN  HFA) 108 (90 Base) MCG/ACT inhaler INHALE 2 PUFFS INTO LUNGS EVERY 6 HOURS AS NEEDED FOR WHEEZING OR FOR SHORTNESS OF BREATH. 9 g 0   amLODipine -benazepril  (LOTREL) 10-40 MG capsule Take 1 capsule by mouth daily. 90 capsule 0   ARIPiprazole  (ABILIFY ) 5 MG tablet Take 1 tablet (5 mg total) by mouth daily. 90 tablet 1   ascorbic acid (VITAMIN C) 500 MG tablet Take by mouth.     aspirin  EC 81 MG tablet Take 1 tablet (81 mg total) by mouth daily. Swallow whole. 90 tablet 3   b complex vitamins tablet Take 1 tablet by mouth daily.     benzonatate  (TESSALON ) 100 MG capsule Take 1 capsule (100 mg total) by mouth 3 (three) times daily as needed. 20 capsule 0   ferrous sulfate  325 (65 FE) MG tablet Take 1 tablet (325 mg total) by mouth 2 (two) times daily with a meal. 60 tablet 0   fexofenadine  (ALLEGRA ) 180 MG tablet Take 1 tablet (180 mg total) by mouth daily. 30 tablet 0   furosemide  (LASIX ) 20 MG tablet Take 1 tablet by mouth once daily  90 tablet 0   hydrochlorothiazide  (HYDRODIURIL ) 25 MG tablet Take 1 tablet by mouth once daily 90 tablet 0   metoprolol  succinate (TOPROL -XL) 50 MG 24 hr tablet Take one tablet daily (50MG ) with or immediately following a meal. 90 tablet 1   Multiple Vitamin (MULTIVITAMIN WITH  MINERALS) TABS tablet Take 1 tablet by mouth daily.     omeprazole  (PRILOSEC) 20 MG capsule Take 1 capsule (20 mg total) by mouth daily. 30 capsule 3   potassium chloride  (KLOR-CON  M) 10 MEQ tablet TAKE 2 TABLETS BY MOUTH IN THE MORNING AND 1 TABLET IN THE EVENING 270 tablet 0   sertraline  (ZOLOFT ) 100 MG tablet Take 1.5 tablets (150 mg total) by mouth daily. 135 tablet 1   ezetimibe  (ZETIA ) 10 MG tablet Take 1 tablet (10 mg total) by mouth daily. 90 tablet 3   rosuvastatin  (CRESTOR ) 40 MG tablet Take 1 tablet (40 mg total) by mouth daily. 90 tablet 3   No current facility-administered medications for this visit.    Allergies No Known Allergies  Histories Past Medical History:  Diagnosis Date   Anemia 11/16/2014   Anxiety    Aortic aneurysm (HCC)    4.1 cm 2019, ascending aorta   Arthritis    Bladder disease    an unusual bladder disease; don't know what it's called (06/30/2015)   Bulging lumbar disc    Cataract    COVID-19 01/09/2021   Depression    GERD (gastroesophageal reflux disease)    History of hiatal hernia    Hyperglycemia 11/16/2014   Hyperlipemia    Hypertension    Joint pain, knee    Migraine    06/30/2015 maybe 2-3 times/year; not as bad as I used to have them   OSA (obstructive sleep apnea)    Sleep apnea    Past Surgical History:  Procedure Laterality Date   ESOPHAGOGASTRODUODENOSCOPY     ESOPHAGOGASTRODUODENOSCOPY (EGD) WITH ESOPHAGEAL DILATION  ~ 2014   JOINT REPLACEMENT     right knee   KNEE ARTHROSCOPY Right 08/23/1993   NASAL SEPTUM SURGERY  ~ 1972   SPINE SURGERY     TOTAL KNEE ARTHROPLASTY  10/11/2011   Procedure: TOTAL KNEE ARTHROPLASTY;  Surgeon: Garnette JONETTA Raman, MD;  Location: [facility] OR;  Service: Orthopedics;  Laterality: Right;   TRANSFORAMINAL LUMBAR INTERBODY FUSION W/ MIS 1 LEVEL Right 03/31/2021   Procedure: Right Lumbar five Sacral one Minimally invasive transforaminal lumbar interbody fusion;  Surgeon: Cheryle Debby LABOR, MD;   Location: [facility] OR;  Service: Neurosurgery;  Laterality: Right;   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)  ~2014   Social History   Socioeconomic History   Marital status: Married    Spouse name: Not on file   Number of children: 3   Years of education: Not on file   Highest education level: GED or equivalent  Occupational History   Not on file  Tobacco Use   Smoking status: Former    Current packs/day: 0.00    Average packs/day: 1.5 packs/day for 6.0 years (9.0 ttl pk-yrs)    Types: Cigarettes    Start date: 12/01/1970    Quit date: 11/30/1976    Years since quitting: 46.7   Smokeless tobacco: Never   Tobacco comments:    stopped smoking in ~ 1990  Vaping Use   Vaping status: Never Used  Substance and Sexual Activity   Alcohol use: No    Alcohol/week: 0.0 standard drinks of alcohol   Drug use:  No   Sexual activity: Yes  Other Topics Concern   Not on file  Social History Narrative   3 children    Naval architect   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)   Married   Completed 12th grade   Enjoys softball   Social Drivers of Corporate Investment Banker Strain: Low Risk  (01/12/2023)   Overall Financial Resource Strain (CARDIA)    Difficulty of Paying Living Expenses: Not hard at all  Food Insecurity: Low Risk  (04/05/2023)   Received from [facility]   Hunger Vital Sign    Worried About Running Out of Food in the Last Year: Never true    Ran Out of Food in the Last Year: Never true  Transportation Needs: Not on file (04/05/2023)  Physical Activity: Inactive (01/12/2023)   Exercise Vital Sign    Days of Exercise per Week: 0 days    Minutes of Exercise per Session: 0 min  Stress: No Stress Concern Present (01/12/2023)   [employer] of Occupational Health - Occupational Stress Questionnaire    Feeling of Stress : Not at all  Social Connections: Socially Integrated (01/12/2023)   Social Connection and Isolation Panel [NHANES]    Frequency of  Communication with Friends and Family: More than three times a week    Frequency of Social Gatherings with Friends and Family: More than three times a week    Attends Religious Services: More than 4 times per year    Active Member of [employer] or Organizations: Yes    Attends Engineer, Structural: More than 4 times per year    Marital Status: Married  Catering Manager Violence: Not At Risk (01/19/2023)   Humiliation, Afraid, Rape, and Kick questionnaire    Fear of Current or Ex-Partner: No    Emotionally Abused: No    Physically Abused: No    Sexually Abused: No   Family History  Problem Relation Age of Onset   Stroke Mother 71       Died of CVA   Hypertension Mother    Anesthesia problems Neg Hx    Hypotension Neg Hx    Malignant hyperthermia Neg Hx    Pseudochol deficiency Neg Hx    Esophageal cancer Neg Hx    Colon cancer Neg Hx    Stomach cancer Neg Hx    Inflammatory bowel disease Neg Hx    Liver disease Neg Hx    Pancreatic cancer Neg Hx    Rectal cancer Neg Hx    I have reviewed his medical, social, and family history in detail and updated the electronic medical record as necessary.    PHYSICAL EXAMINATION  BP 114/70 (BP Location: Left Arm, Patient Position: Sitting, Cuff Size: Large)   Pulse 66   Ht 5' 6 (1.676 m)   Wt 269 lb 2 oz (122.1 kg)   SpO2 95%   BMI 43.44 kg/m  Wt Readings from Last 3 Encounters:  08/31/23 269 lb 2 oz (122.1 kg)  06/15/23 268 lb (121.6 kg)  02/16/23 268 lb (121.6 kg)  GEN: NAD, appears stated age, doesn't appear chronically ill PSYCH: Cooperative, without pressured speech EYE: Conjunctivae pink, sclerae anicteric ENT: MMM CV: Nontachycardic RESP: No audible wheezing GI: NABS, soft, obese, NT, without rebound or guarding MSK/EXT: No significant LE edema SKIN: No jaundice NEURO:  Alert & Oriented x 3, no focal deficits   REVIEW OF DATA  I reviewed the following data at the time of this encounter:  GI  Procedures and  Studies  Outside records documented in note (not available for review) Colonoscopy 02/22/22: 5 mm sessile polyp in the sigmoid colon; completely removed en bloc by cold snare and retrieved specimen Medium Internal hemorrhoids were seen on rectal retroflection. Final Pathologic Diagnosis  TISSUE LABELED SIGMOID POLYP, BIOPSY: HYPERPLASTIC POLYP.   EGD/EUS 02/14/20: 1. The mucosa of the esophagus appeared normal 2. Firm nodule, approx 1.0cm in the cardia. Multiple biopsies taken. This is not classic for alipomaor a carcinoid endoscopically  3. The duodenal mucosa showed no abnormalities in the entire duodenum 4. The pancreas parenchyma appeared heterogeneous without masses, cysts or changes of chronic pancreatitis. The pancreatic duct was non-dilated  5. Favor atypical leiomyoma unchanged FINAL PATHOLOGIC DIAGNOSIS MICROSCOPIC EXAMINATION AND DIAGNOSIS STOMACH, GASTRIC NODULE, BIOPSY: Benign gastric mucosa and submucosa. No malignancy identified. COMMENT: Sections of the gastric nodule show benign appearing gastric mucosa without significant abnormality. One fragment samples benign appearing submucosal tissue. No evidence of neoplasm nor malignancy is identified.  **plan per letter from Dr. Missie to repeat EUS in 2-3 years  EUS 03/21/14: IMPRESSIONS: EUS suggests most likely an atypical leiomyoma vs atypical carcinoid   EGD/colonoscopy (01/2014) PHx/dysphagia = irregular Z line (bx neg for BE), 1 cm subepithelial nodule in fundus (bx neg), and large internal hemorrhoids. CABA listed as 6/20, but should be 6/25. Pt denies any known FHx CC/P.  Colonoscopy (2010) = unremarkable Colonoscopy (2007) = unremarkable Colonoscopy (2003) = hyperplastic polyp   Laboratory Studies  Reviewed those in EPIC  Imaging Studies  No relevant studies to review   ASSESSMENT  Mr. Daisey is a 68 y.o. male with a pmh significant for Hypertension, hyperlipidemia, migraines, cataracts, MDD, AAA, GERD,   gastric cardia nodule (followed by EUS), hyperplastic colon polyps hemorrhoids.  The patient is seen today for evaluation and management of:  1. Gastric nodule   2. Liquid dysphagia   3. History of anemia   4. Hemorrhoids, unspecified hemorrhoid type    The patient is hemodynamically he is due for his upper EUS to follow-up the gastric cardia nodule discussed as a potential leiomyoma versus atypical carcinoid.  It has been followed for greater than 10 years.  The patient is also experiencing some liquid dysphagia symptoms, I suspect this is more presbyesophagus dysmotility related but will rule out that there is any stricture and consider dilation and biopsies at the same time as his upper EUS for endoscopy.  He had a history of slight anemia to make sure he has no evidence of any minutes of significant iron deficiency with anemia, will need to consider updating colon video capsule endoscopy.  If patient's upper endoscopy is unremarkable, and he continues to have liquid dysphagia, consider barium swallow +/- esophageal manometry.  The risks and benefits of endoscopic evaluation were discussed with the patient; these include but are not limited to the risk of perforation, infection, bleeding, missed lesions, lack of diagnosis, severe illness requiring hospitalization, as well as anesthesia and sedation related illnesses.  The patient and/or family is agreeable to proceed.  The risks of an EUS including intestinal perforation, bleeding, infection, aspiration, and medication effects were discussed as was the possibility it may not give a definitive diagnosis if a biopsy is performed.  When a biopsy of the pancreas is done as part of the EUS, there is an additional risk of pancreatitis at the rate of about 1-2%.  It was explained that procedure related pancreatitis is typically mild, although it can be severe and even life  threatening, which is why we do not perform random pancreatic biopsies and only biopsy a  lesion/area we feel is concerning enough to warrant the risk. All patient questions were answered to the best of my ability, and the patient agrees to the aforementioned plan of action with follow-up as indicated.   PLAN  Laboratories outlined below -If patient has evidence of iron deficiency anemia, will need to consider updated colonoscopy +/- VCE at some point this year Proceed with scheduling EGD/EUS - Be prepared for biopsies and dilation  Consider barium swallow in future Consider esophageal manometry in future Consider hemorrhoidal treatment in future if hematochezia continues to be an issue   Orders Placed This Encounter  Procedures   Procedural/ Surgical Case Request: UPPER ESOPHAGEAL ENDOSCOPIC ULTRASOUND (EUS)   CBC   IBC + Ferritin   B12   Folate   Ambulatory referral to Gastroenterology    New Prescriptions   No medications on file   Modified Medications   No medications on file    Planned Follow Up No follow-ups on file.   Total Time in Face-to-Face and in Coordination of Care for patient including independent/personal interpretation/review of prior testing, medical history, examination, medication adjustment, communicating results with the patient directly, and documentation within the EHR is 45 minutes.   Aloha Finner, MD [facility] Office # [phone]

## 2023-09-03 DIAGNOSIS — K649 Unspecified hemorrhoids: Secondary | ICD-10-CM | POA: Insufficient documentation

## 2023-09-06 ENCOUNTER — Other Ambulatory Visit: Payer: Self-pay

## 2023-09-06 DIAGNOSIS — Z862 Personal history of diseases of the blood and blood-forming organs and certain disorders involving the immune mechanism: Secondary | ICD-10-CM

## 2023-09-06 MED ORDER — FERROUS SULFATE 325 (65 FE) MG PO TABS: 325.0000 mg | ORAL_TABLET | Freq: Two times a day (BID) | ORAL | 11 refills | Status: DC

## 2023-09-12 DIAGNOSIS — Z6841 Body Mass Index (BMI) 40.0 and over, adult: Secondary | ICD-10-CM | POA: Diagnosis not present

## 2023-09-12 DIAGNOSIS — M4326 Fusion of spine, lumbar region: Secondary | ICD-10-CM | POA: Diagnosis not present

## 2023-09-12 DIAGNOSIS — M960 Pseudarthrosis after fusion or arthrodesis: Secondary | ICD-10-CM | POA: Diagnosis not present

## 2023-09-19 DIAGNOSIS — H5203 Hypermetropia, bilateral: Secondary | ICD-10-CM | POA: Diagnosis not present

## 2023-09-23 ENCOUNTER — Telehealth: Payer: Self-pay

## 2023-09-23 NOTE — Telephone Encounter (Signed)
Patient called and stated that he was returning your call. Patient is requesting a call back. Please advise.

## 2023-09-26 NOTE — Telephone Encounter (Signed)
The pt appt has been moved from 3/4 to 3/26 at 8 am at [facility].

## 2023-09-26 NOTE — Telephone Encounter (Signed)
 Left message on machine to call back

## 2023-09-27 ENCOUNTER — Other Ambulatory Visit: Payer: Self-pay | Admitting: Family

## 2023-09-27 NOTE — Telephone Encounter (Signed)
 Left message on machine to call back

## 2023-09-27 NOTE — Telephone Encounter (Signed)
Noted pt has been advised of the change in appt date and time.

## 2023-09-27 NOTE — Telephone Encounter (Signed)
Patient is retuning your call stated he received the info regarding the appt change. Thank you

## 2023-10-01 ENCOUNTER — Other Ambulatory Visit: Payer: Self-pay | Admitting: Family

## 2023-10-07 NOTE — Progress Notes (Signed)
10/07/22- 12 yoM retired former smoker(9 pkyrs quit 1990) needing surgical clearance. Previously followed by Dr Vassie Loll for OSA Medical problem list includes OSA,  Allergic Rhinitis, Aortic Aneurysm, GERD, HTN, Hyperlipidemia, Lumbar Radiculopathy, Morbid Obesity, Umbilical Hernia, Depression,  NPSG/Split 11/28/17- AHI 44.7/ hr, desaturation to 71%. Titration to BIPAP 23/19 with treatment-emergent centrals., body weight 255 lbs LOV Dr Vassie Loll, 2020- CPAP 12-17/ [facility]     wirh treatment emergent centrals -----Patient is here for surgical clearance. He is having back surgery. Date is unknown Pt is still doing well with cpap machine  Preop clearance for surgical fusion lumbar spine CPAP auto 12/17/ [facility]     ResMed AirSense 10 Download compliance 83%, AHI 4.9/ hr    centrals = obstructive Body weight today 258 lbs Download reviewed. Sometimes takes it off if feels "smothered" with no pattern identified.  Denies heart/ lung disease. Some nonproductive cough. CXR reviewed. ENT surgery for septoplasty. CXR 10/04/22 FINDINGS: The heart size and mediastinal contours are within normal limits. Both lungs are clear. The visualized skeletal structures are unremarkable. IMPRESSION: No active cardiopulmonary disease.   10/10/23- 67 yoM retired former smoker(9 pkyrs quit 1990) followed for OSA Medical problem list includes OSA,  Allergic Rhinitis, Aortic Aneurysm, GERD, HTN, Hyperlipidemia, Lumbar Radiculopathy, Morbid Obesity, Umbilical Hernia, Depression,  NPSG/Split 11/28/17- AHI 44.7/ hr, desaturation to 71%. Titration to BIPAP 23/19 with treatment-emergent centrals., body weight 255 lbs CPAP 12-17/ [facility]     wirh treatment emergent centrals      ResMed AirSense 10             pressure range 12.5-13.7   Significant leak Download compliance 60%, AHI 10.9/hr   many short nights Body weight today  -----Doing well.  Was told he would get a new CPAP at last OV in July 2024.  Never got call to get unit. Wasn't  eligible then. Mask leaks. Download reviewed. Uses every night, but many short nights.  We will replace machine now and refit mask, changing auto range to 12-20 Says Dr Jens Som Cardiology is "working on [employer] Denies cough or wheeze. Agrees to PFT. CXR 06/15/23 IMPRESSION: Low lung volumes without evidence of acute cardiopulmonary disease   ROS-see HPI   + = positive Constitutional:    weight loss, night sweats, fevers, chills, fatigue, lassitude. HEENT:    headaches, difficulty swallowing, tooth/dental problems, sore throat,       sneezing, itching, ear ache, nasal congestion, post nasal drip, snoring CV:    chest pain, orthopnea, PND, +swelling in lower extremities, anasarca,                                   dizziness, palpitations Resp:   +shortness of breath with exertion or at rest.                productive cough,   non-productive cough, coughing up of blood.              change in color of mucus.  wheezing.   Skin:    rash or lesions. GI:  No-   heartburn, indigestion, abdominal pain, nausea, vomiting, diarrhea,                 change in bowel habits, loss of appetite GU: dysuria, change in color of urine, no urgency or frequency.   flank pain. MS:   joint pain, stiffness, decreased range of motion, back pain. Neuro-  nothing unusual Psych:  change in mood or affect.  +depression or +anxiety.   memory loss.  OBJ- Physical Exam General- Alert, Oriented, Affect-appropriate, Distress- none acute, +obese Skin- rash-none, lesions- none, excoriation- none Lymphadenopathy- none Head- atraumatic            Eyes- Gross vision intact, PERRLA, conjunctivae and secretions clear            Ears- Hearing, canals-normal            Nose- Clear, no-Septal dev, mucus, polyps, erosion, perforation             Throat- Mallampati III , mucosa clear , drainage- none, tonsils- atrophic Neck- flexible , trachea midline, no stridor , thyroid nl, carotid no bruit Chest - symmetrical excursion ,  unlabored           Heart/CV- RRR , no murmur , no gallop  , no rub, nl s1 s2                           - JVD- none , edema- none, stasis changes- none, varices- none           Lung- +few crackles L base, wheeze- none, cough- none , dullness-none, rub- none           Chest wall-  Abd-  Br/ Gen/ Rectal- Not done, not indicated Extrem- cyanosis- none, clubbing, none, atrophy- none, strength- nl Neuro- grossly intact to observation

## 2023-10-10 ENCOUNTER — Ambulatory Visit: Payer: Medicare Other | Admitting: Internal Medicine

## 2023-10-10 ENCOUNTER — Encounter: Payer: Self-pay | Admitting: Internal Medicine

## 2023-10-10 VITALS — BP 144/84 | HR 74 | Temp 97.6°F | Ht 66.0 in | Wt 276.2 lb

## 2023-10-10 DIAGNOSIS — G4733 Obstructive sleep apnea (adult) (pediatric): Secondary | ICD-10-CM | POA: Diagnosis not present

## 2023-10-10 DIAGNOSIS — R0609 Other forms of dyspnea: Secondary | ICD-10-CM

## 2023-10-10 NOTE — Patient Instructions (Signed)
Order-DME [facility]- please replace old CPAP machine, change to auto 12-20, please refit mask, humidifier, supplies, and please install AirView/ card  Order- schedule PFT   dx Dyspnea on exertion

## 2023-10-10 NOTE — Assessment & Plan Note (Signed)
Benefits from CPAP and compliant Plan- replace old CPAP, change to auto 12-20, refit mask

## 2023-10-10 NOTE — Assessment & Plan Note (Signed)
Likely cardiac, but will update PFT. Former smoker, was Naval architect.

## 2023-10-11 ENCOUNTER — Ambulatory Visit: Payer: Medicare Other

## 2023-10-11 DIAGNOSIS — R0609 Other forms of dyspnea: Secondary | ICD-10-CM | POA: Diagnosis not present

## 2023-10-11 LAB — PULMONARY FUNCTION TEST
DL/VA % pred: 121 %
DL/VA: 5.05 ml/min/mmHg/L
DLCO cor % pred: 104 %
DLCO cor: 24.28 ml/min/mmHg
DLCO unc % pred: 102 %
DLCO unc: 23.72 ml/min/mmHg
FEF 25-75 Post: 2.17 L/s
FEF 25-75 Pre: 1.34 L/s
FEF2575-%Change-Post: 61 %
FEF2575-%Pred-Post: 98 %
FEF2575-%Pred-Pre: 60 %
FEV1-%Change-Post: 10 %
FEV1-%Pred-Post: 76 %
FEV1-%Pred-Pre: 68 %
FEV1-Post: 2.17 L
FEV1-Pre: 1.95 L
FEV1FVC-%Change-Post: 4 %
FEV1FVC-%Pred-Pre: 99 %
FEV6-%Change-Post: 6 %
FEV6-%Pred-Post: 77 %
FEV6-%Pred-Pre: 73 %
FEV6-Post: 2.81 L
FEV6-Pre: 2.65 L
FEV6FVC-%Pred-Post: 106 %
FEV6FVC-%Pred-Pre: 106 %
FVC-%Change-Post: 5 %
FVC-%Pred-Post: 73 %
FVC-%Pred-Pre: 69 %
FVC-Post: 2.81 L
FVC-Pre: 2.66 L
Post FEV1/FVC ratio: 77 %
Post FEV6/FVC ratio: 100 %
Pre FEV1/FVC ratio: 73 %
Pre FEV6/FVC Ratio: 100 %
RV % pred: 113 %
RV: 2.48 L
TLC % pred: 85 %
TLC: 5.3 L

## 2023-10-11 NOTE — Progress Notes (Signed)
 Full PFT performed today.

## 2023-10-11 NOTE — Patient Instructions (Signed)
 Full PFT performed today.

## 2023-10-12 ENCOUNTER — Ambulatory Visit: Payer: Medicare Other | Admitting: Psychiatry

## 2023-10-12 ENCOUNTER — Encounter: Payer: Self-pay | Admitting: Psychiatry

## 2023-10-12 DIAGNOSIS — F4001 Agoraphobia with panic disorder: Secondary | ICD-10-CM | POA: Diagnosis not present

## 2023-10-12 DIAGNOSIS — F411 Generalized anxiety disorder: Secondary | ICD-10-CM

## 2023-10-12 DIAGNOSIS — F333 Major depressive disorder, recurrent, severe with psychotic symptoms: Secondary | ICD-10-CM | POA: Diagnosis not present

## 2023-10-12 DIAGNOSIS — R635 Abnormal weight gain: Secondary | ICD-10-CM

## 2023-10-12 DIAGNOSIS — G4730 Sleep apnea, unspecified: Secondary | ICD-10-CM

## 2023-10-12 DIAGNOSIS — G4710 Hypersomnia, unspecified: Secondary | ICD-10-CM

## 2023-10-12 MED ORDER — SERTRALINE HCL 100 MG PO TABS: 150.0000 mg | ORAL_TABLET | Freq: Every day | ORAL | 1 refills | Status: DC

## 2023-10-12 MED ORDER — ARIPIPRAZOLE 5 MG PO TABS: 5.0000 mg | ORAL_TABLET | Freq: Every day | ORAL | 1 refills | Status: DC

## 2023-10-12 NOTE — Progress Notes (Signed)
[name] [id number] 1956-01-17 68 y.o.     Subjective:   Patient ID:  Jeremy [name] is a 68 y.o. (DOB 1955/11/16) male.  Chief Complaint:  Chief Complaint  Patient presents with   Follow-up   Depression   Anxiety   [name] presents  today for follow-up of anxiety and depression.   He had been out on disability due to his back since September at that time.  seen April 2020.  Since retirement he has been under less stress and had asked to reduce and hopefully eliminate some medications.  We reduced Abilify from 7.5 to 5 mg daily.  As of December 04, 2019 the following is noted: Still good. nOTHing worse with dose reduction.   Benefit from meds. Help depression, anxiety and anger.  They are all under control. No mood swings nor prolonged depression.  Anxiety OK.  Wants to try to reduce meds again if possible bc less stress as noted. Plan because of sexual side effects with sertraline he prefers to reduce sertraline to 50 mg daily and continue Abilify 5 mg daily.  02/21/2020 appointment with the following noted: Doing well still after reducing sertraline to 50 mg daily but no improvement in sexual function.  depression and anxiety  Are still under control. No panic.   Plan:  We discussed the options of trying to eliminate gradually the Abilify versus reducing the sertraline which is probably more risky.  However he is having sexual side effects from sertraline and no side effects from Abilify.  He prefers to stop the sertraline and see if he can get by without it. Therefore reduce sertraline to 25 mg daily for 2 weeks then stop it  05/07/20 appt with the following noted: Off sertraline without more depression or anxiety.  No panic.  No withdrawal effects. Sexual function is not better.  Erection problems. Tried Viagra without help.  Saw urologist and had shots which helped then stopped helping. He wants to try stopping Abilify to see if sexual function will return. Plan:  Since retiring his stress level has reduced significantly.  His depression and anxiety are under control.  He wants to try to reduce medications again.   He wants to try DC Abilify to see if sexual function is better despite risk relapse. He's been fine so far off sertraline.    06/02/2020 phone call from patient:Pt called and said that he had a panic attack yesterday. He wants to know if he can go back on his medications MD response: Fairly recently discontinued sertraline 50 mg daily.  That is the best antipanic med.  Have him restart sertraline 25 mg daily for 7 days and then 50 mg daily.  Will take several weeks to help.  Hold off the Abilify bc that was for depression. 06/13/20 TC: Pt LM on VM needing apt today to see CC. Returned call, Pt stated he is still having issues with anxiety and needs to see CC today. His follow up apt is 12/13.   2 panic attacks but close today.  06/16/2020 urgent appointment with the following noted: Seen with wife Panic and wanted to restart meds.  Wife says he's more depressed.  Only sleeps and eats and is like a walking zombie without meds. Lost father and stepmother and friend.  She thinks he's depressed.  B committed suicide years ago.   Now he thinks I'm going to leave him. Family notices his depression.  Having to help adopted father with cancer. He  agrees with wife. Tense and SOB with anxiety. Wife thinks he represses things.  Mo left him in an orphanage and has abandonment issues even with her.  Thinking she will leave. Plan: Relapse worse off meds  Increase sertraline to 100 mg daily.  May need to go higher Increase Abilify to 10 mg daily for severe depression with paranoid rumination. OK prn Xanax prn panic   08/04/20 appt with following noted:  Seen with wife, Fannie Knee Better with tension.  Feels less down too.   SE increased appetite.  But had recently lost 20 # and lately snacking more. Wife sees dramatic turnaround and he's more talkative and  engaged and friends notice too.  It's working. No Xanax used.  Staying awake during day.   Not quite back to normal but really improved per wife.   Plan: If he is back to baseline at follow-up we will consider reducing the Abilify somewhat in order to reduce the weight gain potential if possible.  10/07/2020 appointment with following noted: Still sleeping too much and eating a lot.  Otherwise is OK.  Doesn't feel depressed.  Interest is a little better.  Not anxious.    Not aware of SE otherwise.  Not worrying too much.  Has retired.  Has reduced his stress significantly. Using CPAP. No panic. Plan: Continue sertraline 100 mg daily Reduce Abilify to 5 mg daily for the history of severe paranoid rumination and depression which are improved, but apparently it's sedating him.  12/11/2020 appointment with the following noted: Reduced Abilify to 5 mg daily without much change.   Mood been alright withuot depression.  Anxiety is not a problem. Sleep 9 hours at night and then naps in the morning after breakfast and then in afternoon.  Not much to do.  Not much he wants to do in retirement except golf and physical limitations hurt that. No other hobbies.     Weight stable.  He stopped the Abilify couple of weeks ago bc insurance told him they would not cover it and it would be $800 dollars. Plan:  Continue sertraline 100 mg daily He stopped Abilify 5 mg 2 weeks ago DT cost Disc risk relapse.  Call if relapse bc at risk for a couple of mos.  03/24/2021 appointment with the following noted:  Seen with daughter, Candise Bowens Patient stopped Abilify 2 weeks prior to last appointment due to being told it would be $800.  He wanted to stay off the medication and was doing okay at the time of appointment. Taking sertraline 150 mg daily. Xanax makes him too sleepy. Alright.  A little bit of anxiety.  Worrying about things with wife's problems with her brother's death.   Candise Bowens notices his anxiety is out of control.   He's constantly hovering her and obsessing she may leave him again as in the past.  Definitely not himself and family notices not interacting with people.  She's noticed changes worsening over the year. B in law died 06-13-25making things worse. Sleep is interrupted.  He recognizes he's more fearful than normal. Is sad also. Pending back surgery 03/31/2021 Plan: Because he relapsed again off of Abilify we will do the following: Continue sertraline 150 mg daily Restart Abilify 10 mg daily bc did best on it.  06/08/21 appt noted: Better with Abilify and SE hungry with weight gain of ? Amount. Rubs teeth front together at rest. No depression and anxiety now.  Patient reports stable mood and denies depressed or irritable moods.  Patient denies any recent difficulty with anxiety.  Patient denies difficulty with sleep initiation or maintenance. Denies appetite disturbance.  Patient reports that energy and motivation have been good.  Patient denies any difficulty with concentration.  Patient denies any suicidal ideation. Back pain not gone but better after surgery He remains on  Continue sertraline 150 mg daily and Abilify 10 Plan: Continue sertraline 150 mg daily Continue Abilify but we will reduce to 5 mg bc hunger and rubbing teeth together may be SE More paranoid off Abilify.  09/08/2021 appointment noted: Been doing good since here after reduction to Abilify 5 mg daily.  Thinks Abilify SE hunger and weight gain. Rubbing teeth together stopped with reduction in dose. Relapsed  Continues sertraline 150 mg daily. Plan: Continue sertraline 150 mg daily Continue Abilify 5 mg  bc hunger and rubbing teeth together may be SE More paranoid off Abilify.  12/08/2021 appointment with the following noted: Back surgery August and it still bothers him to walk.  No exercise.  No weight loss. Good with depression.  F in law died last night with complications of CA and hip fx.  He was good man.   Anxiety also  been ok without fear or paranoia. SE occ diarrhea and nothing else. Sleep is good.   Wife Fannie Knee with problems psych dealing with brother's death and F's cancer. Plan: Increase metoformin to 1000 mg BID and if no benefit will stop it.  No benefit at 500 mg BID Continue sertraline 150 mg daily. Continue Abilify 5 mg daily because he was paranoid off of it  03/10/2022 appointment with the following noted: Had too much nausea on the higher dose of metformin and stopped it Wanted to try topiramate to see if it helped with his hunger problems and started 25 mg twice daily to then increase to 50 mg twice daily With topiramate lost a couple of # but seemed it quit working.  No SE with it. Mood been alright I guess.  Not depressed and no panic. No fear.  Sleep is good. Wants to lose mor eweight if possible. Plan: Increase topiramate to 75 mg BID for wt control and if NR after a couple of weeks then can increase to 100 mg BID  06/10/22 appt noted: Not losing wt with topiramate but no scales at home.   No exercise bc of back pain. Some reduction in appetite.   SE mild diarrhea but less than metformin. Current psych meds: topiramate 100 mg BID, sertraline 150 mg,  Abilify 5 mg daily Depression is pretty good.  No panic.  Anxiety level is OK. No other med problems except back and no history kidney stones. Cut out bread and potatoes. Plan: Increase topiramate to 150 mg BID for wt control bc needs to lose wt DT back pain  10/11/22 appt noted: No difference with increase topiramate 150 mg BID,  no benefit with that med.   Current others: sertraliline and Abilify Pretty good mood.  Not depressed.  Anxiety is OK too.  No panic.   SE non unless wt issues. Sleep is good.  Needs another back surgery.  2nd opinion.    Needs 2 surgeries.   Plan: No benefit with topiramate so wean it over several weeks or slower if anxious.  Reduce topiramate to 2 twice daily for 2 weeks then 1 twice daily for 2 weeks, then 1  daily for 1 week, then stop it.  04/11/23 appt noted: Psych meds Abilify 5 mg daily, sertraline 50 mg  daily No problems off the topiramate.   Conssitent with meds.  No SE Not dep and doing well.  No panic or sig anxiety.  No fear.  No crying spells. Another back surgery in July but not better yet.  I'm lazy and need and get out and walk.  Realizes it.  First back surgery did the same thing, "just lazy".   Had 3 back surgeries with first a failure.  Brief anger spell quickly resolved. Plan: no changes  10/12/23 appt noted: Med as above Been ok over the last 6 mos. Not dep. No panic.  Anxiety ok.  No fear.   No SE. Only med px is SOB and being evaluated by Dr. Maple Hudson. Back is better but not there yet.  Finally started to do what doc said with walking and that has helped his back.     Past history of psychiatric disability. Not sure how many episodes of depression he's had in life.  Historically about the same levels of depression and anxiety   Past Psychiatric Medication Trials: Sertraline 100 Abilify 10 helped SE weight gain Xanax 0.5 Metformin N Topiramate for wt NR  Review of Systems:  Review of Systems  Respiratory:  Positive for shortness of breath.   Cardiovascular:  Negative for chest pain and palpitations.  Genitourinary:        ED persistent  Musculoskeletal:  Positive for back pain.  Neurological:  Negative for tremors.  Psychiatric/Behavioral:  Positive for depression. Negative for agitation, behavioral problems, confusion, decreased concentration, dysphoric mood, hallucinations, self-injury, sleep disturbance and suicidal ideas. The patient is not nervous/anxious and is not hyperactive.     Medications: I have reviewed the patient's current medications.  Current Outpatient Medications  Medication Sig Dispense Refill   albuterol (VENTOLIN HFA) 108 (90 Base) MCG/ACT inhaler INHALE 2 PUFFS INTO LUNGS EVERY 6 HOURS AS NEEDED FOR WHEEZING OR FOR SHORTNESS OF BREATH. 9 g  0   amLODipine-benazepril (LOTREL) 10-40 MG capsule Take 1 capsule by mouth once daily 90 capsule 0   ascorbic acid (VITAMIN C) 500 MG tablet Take by mouth.     aspirin EC 81 MG tablet Take 1 tablet (81 mg total) by mouth daily. Swallow whole. 90 tablet 3   b complex vitamins tablet Take 1 tablet by mouth daily.     benzonatate (TESSALON) 100 MG capsule Take 1 capsule (100 mg total) by mouth 3 (three) times daily as needed. 20 capsule 0   ferrous sulfate 325 (65 FE) MG tablet Take 1 tablet (325 mg total) by mouth 2 (two) times daily with a meal. 60 tablet 11   fexofenadine (ALLEGRA) 180 MG tablet Take 1 tablet (180 mg total) by mouth daily. 30 tablet 0   furosemide (LASIX) 20 MG tablet Take 1 tablet by mouth once daily 90 tablet 0   hydrochlorothiazide (HYDRODIURIL) 25 MG tablet Take 1 tablet (25 mg total) by mouth daily. 90 tablet 0   metoprolol succinate (TOPROL-XL) 50 MG 24 hr tablet Take one tablet daily (50MG ) with or immediately following a meal. 90 tablet 1   Multiple Vitamin (MULTIVITAMIN WITH MINERALS) TABS tablet Take 1 tablet by mouth daily.     omeprazole (PRILOSEC) 20 MG capsule Take 1 capsule (20 mg total) by mouth daily. 30 capsule 3   potassium chloride (KLOR-CON M) 10 MEQ tablet TAKE 2 TABLETS BY MOUTH IN THE MORNING AND 1 TABLET IN THE EVENING 270 tablet 0   ARIPiprazole (ABILIFY) 5 MG tablet Take 1 tablet (5  mg total) by mouth daily. 90 tablet 1   ezetimibe (ZETIA) 10 MG tablet Take 1 tablet (10 mg total) by mouth daily. 90 tablet 3   rosuvastatin (CRESTOR) 40 MG tablet Take 1 tablet (40 mg total) by mouth daily. 90 tablet 3   sertraline (ZOLOFT) 100 MG tablet Take 1.5 tablets (150 mg total) by mouth daily. 135 tablet 1   No current facility-administered medications for this visit.    Medication Side Effects: ED with Zoloft.  Viagra failed.  Using shots.  Allergies: No Known Allergies  Past Medical History:  Diagnosis Date   Anemia 11/16/2014   Anxiety    Aortic  aneurysm (HCC)    4.1 cm 2019, ascending aorta   Arthritis    Bladder disease    "an unusual bladder disease; don't know what it's called" (06/30/2015)   Bulging lumbar disc    Cataract    COVID-19 01/09/2021   Depression    GERD (gastroesophageal reflux disease)    History of hiatal hernia    Hyperglycemia 11/16/2014   Hyperlipemia    Hypertension    Joint pain, knee    Migraine    06/30/2015 "maybe 2-3 times/year; not as bad as I used to have them"   OSA (obstructive sleep apnea)    Sleep apnea     Family History  Problem Relation Age of Onset   Stroke Mother 26       Died of CVA   Hypertension Mother    Anesthesia problems Neg Hx    Hypotension Neg Hx    Malignant hyperthermia Neg Hx    Pseudochol deficiency Neg Hx    Esophageal cancer Neg Hx    Colon cancer Neg Hx    Stomach cancer Neg Hx    Inflammatory bowel disease Neg Hx    Liver disease Neg Hx    Pancreatic cancer Neg Hx    Rectal cancer Neg Hx     Social History   Socioeconomic History   Marital status: Married    Spouse name: Not on file   Number of children: 3   Years of education: Not on file   Highest education level: GED or equivalent  Occupational History   Not on file  Tobacco Use   Smoking status: Former    Current packs/day: 0.00    Average packs/day: 1.5 packs/day for 6.0 years (9.0 ttl pk-yrs)    Types: Cigarettes    Start date: 12/01/1970    Quit date: 11/30/1976    Years since quitting: 46.8   Smokeless tobacco: Never   Tobacco comments:    "stopped smoking in ~ 1990"  Vaping Use   Vaping status: Never Used  Substance and Sexual Activity   Alcohol use: No    Alcohol/week: 0.0 standard drinks of alcohol   Drug use: No   Sexual activity: Yes  Other Topics Concern   Not on file  Social History Narrative   3 children    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   Completed 12th grade   Enjoys softball   Social Drivers of Corporate investment banker  Strain: Low Risk  (01/12/2023)   Overall Financial Resource Strain (CARDIA)    Difficulty of Paying Living Expenses: Not hard at all  Food Insecurity: Low Risk  (04/05/2023)   Received from [facility]   Hunger Vital Sign    Worried About Running Out of Food in the Last Year: Never true  Ran Out of Food in the Last Year: Never true  Transportation Needs: Not on file (04/05/2023)  Physical Activity: Inactive (01/12/2023)   Exercise Vital Sign    Days of Exercise per Week: 0 days    Minutes of Exercise per Session: 0 min  Stress: No Stress Concern Present (01/12/2023)   [employer] of Occupational Health - Occupational Stress Questionnaire    Feeling of Stress : Not at all  Social Connections: Socially Integrated (01/12/2023)   Social Connection and Isolation Panel [NHANES]    Frequency of Communication with Friends and Family: More than three times a week    Frequency of Social Gatherings with Friends and Family: More than three times a week    Attends Religious Services: More than 4 times per year    Active Member of [employer] or Organizations: Yes    Attends Banker Meetings: More than 4 times per year    Marital Status: Married  Catering manager Violence: Not At Risk (01/19/2023)   Humiliation, Afraid, Rape, and Kick questionnaire    Fear of Current or Ex-Partner: No    Emotionally Abused: No    Physically Abused: No    Sexually Abused: No    Past Medical History, Surgical history, Social history, and Family history were reviewed and updated as appropriate.   Please see review of systems for further details on the patient's review from today.   Objective:   Physical Exam:  There were no vitals taken for this visit.  Physical Exam Constitutional:      General: He is not in acute distress.    Appearance: He is well-developed.  Musculoskeletal:        General: No deformity.  Neurological:     Mental Status: He is alert and oriented to person, place, and  time.     Motor: No tremor.     Coordination: Coordination normal.     Gait: Gait normal.  Psychiatric:        Attention and Perception: Attention and perception normal.        Mood and Affect: Mood is not anxious or depressed. Affect is not flat.        Speech: Speech normal. Speech is not slurred.        Behavior: Behavior is not slowed.        Thought Content: Thought content is not paranoid or delusional. Thought content does not include homicidal or suicidal ideation. Thought content does not include suicidal plan.        Cognition and Memory: Cognition normal.     Comments: Insight and judgment fair. No auditory or visual hallucinations. No AIM     Lab Review:     Component Value Date/Time   NA 140 01/24/2023 0905   K 4.0 01/24/2023 0905   CL 101 01/24/2023 0905   CO2 30 01/24/2023 0905   GLUCOSE 94 01/24/2023 0905   BUN 21 01/24/2023 0905   CREATININE 0.94 01/24/2023 0905   CREATININE 1.05 01/13/2018 1542   CALCIUM 9.6 01/24/2023 0905   PROT 6.2 07/05/2023 1107   ALBUMIN 4.0 07/05/2023 1107   AST 19 07/05/2023 1107   ALT 23 07/05/2023 1107   ALKPHOS 112 07/05/2023 1107   BILITOT 0.2 07/05/2023 1107   GFRNONAA >60 03/26/2021 1259   GFRAA >60 10/25/2017 0438       Component Value Date/Time   WBC 7.9 08/31/2023 0925   RBC 4.88 08/31/2023 0925   HGB 13.8 08/31/2023 0925   HCT  41.1 08/31/2023 0925   PLT 279.0 08/31/2023 0925   MCV 84.2 08/31/2023 0925   MCH 31.6 03/26/2021 1259   MCHC 33.6 08/31/2023 0925   RDW 15.7 (H) 08/31/2023 0925   LYMPHSABS 1.7 01/24/2023 0905   MONOABS 0.7 01/24/2023 0905   EOSABS 0.2 01/24/2023 0905   BASOSABS 0.0 01/24/2023 0905    No results found for: "POCLITH", "LITHIUM"   No results found for: "PHENYTOIN", "PHENOBARB", "VALPROATE", "CBMZ"   .res Assessment: Plan:    Generalized anxiety disorder - Plan: sertraline (ZOLOFT) 100 MG tablet  Severe recurrent major depression w/psychotic features, mood-congruent (HCC) - Plan:  ARIPiprazole (ABILIFY) 5 MG tablet, sertraline (ZOLOFT) 100 MG tablet  Panic disorder with agoraphobia -- in remission - Plan: sertraline (ZOLOFT) 100 MG tablet  Hypersomnia with sleep apnea  Weight gain due to medication Erectile dysfunction due to SSRI  30-minute face to face time with patient  and his daughterwas spent on counseling and coordination of care. We discussed Jorja Loa has a history of severe depression and panic disorder and anxiety that caused him to go out on disability in 2019. He relapsed after retirement off meds. Relapsed again off Abilify in August 2022 and resolved on 10 mg again .  Ok now on 5 mg daily However he had weight gain .  No EPS now. Relapse worse off meds. He's much too inactive in retirement and disc this again.   Emphasized need to be active in retirment otherwise health will decline including mental health.  Wife busy in wildlife rehab.   He's doing better with walking.  Continue sertraline 150 mg daily Continue Abilify 5 mg  bc hunger and rubbing teeth together may be SE More paranoid off Abilify. None current.  Disc risk. Disc Good RX  Discussed potential metabolic side effects associated with atypical antipsychotics, as well as potential risk for movement side effects. Advised pt to contact office if movement side effects occur.  No AIM Disc diet and exercise.      Seeing Dr. Melba Coon re: breathing  Follow-up 6  mos  Meredith Staggers, MD, DFAPA  Please see After Visit Summary for patient specific instructions.  Future Appointments  Date Time Provider Department Center  10/09/2024  9:30 AM Jetty Duhamel D, MD [facility] None    No orders of the defined types were placed in this encounter.      -------------------------------

## 2023-10-27 ENCOUNTER — Other Ambulatory Visit: Payer: Self-pay | Admitting: Family

## 2023-11-09 ENCOUNTER — Encounter (HOSPITAL_COMMUNITY): Payer: Self-pay | Admitting: Gastroenterology

## 2023-11-09 ENCOUNTER — Telehealth: Payer: Self-pay | Admitting: Gastroenterology

## 2023-11-09 NOTE — Progress Notes (Signed)
 Attempted to obtain medical history for pre op call via telephone, unable to reach at this time. HIPAA compliant voicemail message left requesting return call to pre surgical testing department.

## 2023-11-09 NOTE — Telephone Encounter (Addendum)
 Procedure:EUS Procedure date: 11/16/23  Procedure location: [facility] Arrival Time: 6:30 am Spoke with the patient Y/N:   No, I left a detailed message for the patient to return call 11/09/23 @ 2:34 pm No, I left a detailed message for the patient to return call  11/10/23 @ 1:58 pm No, I left a detailed message for the patient to return call 11/11/23 @ 10:58 am Yes 11/11/23 @ 2:51 pm    Any prep concerns? NO  Has the patient obtained the prep from the pharmacy ? No prep needed Do you have a care partner and transportation: Yes Any additional concerns? No

## 2023-11-11 NOTE — Telephone Encounter (Signed)
 Inbound call from patient returning phone call regarding previous notes. Requesting a call back. Please advise, thank you.

## 2023-11-11 NOTE — Telephone Encounter (Signed)
 I left a detailed message for the patient to return call  11/11/23 @ 2:39 pm

## 2023-11-15 DIAGNOSIS — H35033 Hypertensive retinopathy, bilateral: Secondary | ICD-10-CM | POA: Diagnosis not present

## 2023-11-15 DIAGNOSIS — H35022 Exudative retinopathy, left eye: Secondary | ICD-10-CM | POA: Diagnosis not present

## 2023-11-15 DIAGNOSIS — D3132 Benign neoplasm of left choroid: Secondary | ICD-10-CM | POA: Diagnosis not present

## 2023-11-15 DIAGNOSIS — H53022 Refractive amblyopia, left eye: Secondary | ICD-10-CM | POA: Diagnosis not present

## 2023-11-15 NOTE — Anesthesia Preprocedure Evaluation (Signed)
 Anesthesia Evaluation    Airway Mallampati: II  TM Distance: >3 FB Neck ROM: Full    Dental no notable dental hx.    Pulmonary former smoker   Pulmonary exam normal breath sounds clear to auscultation       Cardiovascular hypertension, Normal cardiovascular exam Rhythm:Regular Rate:Normal     Neuro/Psych    GI/Hepatic   Endo/Other    Renal/GU      Musculoskeletal  (+) Arthritis ,    Abdominal   Peds negative pediatric ROS (+)  Hematology   Anesthesia Other Findings   Reproductive/Obstetrics                             Anesthesia Physical Anesthesia Plan  ASA: 3  Anesthesia Plan: MAC   Post-op Pain Management:    Induction: Intravenous  PONV Risk Score and Plan: Propofol infusion and Treatment may vary due to age or medical condition  Airway Management Planned: Natural Airway  Additional Equipment:   Intra-op Plan:   Post-operative Plan:   Informed Consent: I have reviewed the patients History and Physical, chart, labs and discussed the procedure including the risks, benefits and alternatives for the proposed anesthesia with the patient or authorized representative who has indicated his/her understanding and acceptance.     Dental advisory given  Plan Discussed with: CRNA  Anesthesia Plan Comments:        Anesthesia Quick Evaluation

## 2023-11-16 ENCOUNTER — Encounter (HOSPITAL_COMMUNITY)
Admission: RE | Disposition: A | Discharge status: Home / Self Care | Payer: Self-pay | Source: Ambulatory Visit | Attending: Gastroenterology

## 2023-11-16 ENCOUNTER — Ambulatory Visit (HOSPITAL_COMMUNITY)
Admission: RE | Admit: 2023-11-16 | Discharge: 2023-11-16 | Disposition: A | Discharge status: Home / Self Care | Payer: Medicare Other | Source: Ambulatory Visit | Attending: Gastroenterology | Admitting: Gastroenterology

## 2023-11-16 ENCOUNTER — Ambulatory Visit (HOSPITAL_COMMUNITY): Payer: Self-pay | Admitting: Certified Registered Nurse Anesthetist

## 2023-11-16 ENCOUNTER — Encounter (HOSPITAL_COMMUNITY): Payer: Self-pay | Admitting: Gastroenterology

## 2023-11-16 ENCOUNTER — Other Ambulatory Visit: Payer: Self-pay

## 2023-11-16 ENCOUNTER — Ambulatory Visit (HOSPITAL_BASED_OUTPATIENT_CLINIC_OR_DEPARTMENT_OTHER): Payer: Self-pay | Admitting: Certified Registered Nurse Anesthetist

## 2023-11-16 DIAGNOSIS — K2980 Duodenitis without bleeding: Secondary | ICD-10-CM | POA: Insufficient documentation

## 2023-11-16 DIAGNOSIS — K2289 Other specified disease of esophagus: Secondary | ICD-10-CM | POA: Insufficient documentation

## 2023-11-16 DIAGNOSIS — D509 Iron deficiency anemia, unspecified: Secondary | ICD-10-CM | POA: Diagnosis not present

## 2023-11-16 DIAGNOSIS — M1990 Unspecified osteoarthritis, unspecified site: Secondary | ICD-10-CM | POA: Diagnosis not present

## 2023-11-16 DIAGNOSIS — R1310 Dysphagia, unspecified: Secondary | ICD-10-CM | POA: Diagnosis not present

## 2023-11-16 DIAGNOSIS — Z8249 Family history of ischemic heart disease and other diseases of the circulatory system: Secondary | ICD-10-CM | POA: Insufficient documentation

## 2023-11-16 DIAGNOSIS — K222 Esophageal obstruction: Secondary | ICD-10-CM | POA: Insufficient documentation

## 2023-11-16 DIAGNOSIS — I899 Noninfective disorder of lymphatic vessels and lymph nodes, unspecified: Secondary | ICD-10-CM | POA: Diagnosis not present

## 2023-11-16 DIAGNOSIS — R1319 Other dysphagia: Secondary | ICD-10-CM

## 2023-11-16 DIAGNOSIS — Z87891 Personal history of nicotine dependence: Secondary | ICD-10-CM | POA: Diagnosis not present

## 2023-11-16 DIAGNOSIS — I110 Hypertensive heart disease with heart failure: Secondary | ICD-10-CM | POA: Diagnosis not present

## 2023-11-16 DIAGNOSIS — K449 Diaphragmatic hernia without obstruction or gangrene: Secondary | ICD-10-CM

## 2023-11-16 DIAGNOSIS — I10 Essential (primary) hypertension: Secondary | ICD-10-CM | POA: Diagnosis not present

## 2023-11-16 DIAGNOSIS — K3189 Other diseases of stomach and duodenum: Secondary | ICD-10-CM | POA: Insufficient documentation

## 2023-11-16 DIAGNOSIS — Z862 Personal history of diseases of the blood and blood-forming organs and certain disorders involving the immune mechanism: Secondary | ICD-10-CM | POA: Diagnosis not present

## 2023-11-16 DIAGNOSIS — D490 Neoplasm of unspecified behavior of digestive system: Secondary | ICD-10-CM | POA: Insufficient documentation

## 2023-11-16 DIAGNOSIS — R933 Abnormal findings on diagnostic imaging of other parts of digestive tract: Secondary | ICD-10-CM | POA: Diagnosis not present

## 2023-11-16 DIAGNOSIS — I5032 Chronic diastolic (congestive) heart failure: Secondary | ICD-10-CM | POA: Diagnosis not present

## 2023-11-16 HISTORY — PX: SAVORY DILATION: SHX5439

## 2023-11-16 HISTORY — PX: UPPER ESOPHAGEAL ENDOSCOPIC ULTRASOUND (EUS): SHX6562

## 2023-11-16 HISTORY — PX: ESOPHAGOGASTRODUODENOSCOPY: SHX5428

## 2023-11-16 SURGERY — EGD (ESOPHAGOGASTRODUODENOSCOPY)
Anesthesia: Monitor Anesthesia Care

## 2023-11-16 MED ORDER — ACETAMINOPHEN 10 MG/ML IV SOLN: 1000.0000 mg | Freq: Once | INTRAVENOUS | Status: DC | PRN

## 2023-11-16 MED ORDER — OMEPRAZOLE 40 MG PO CPDR
40.0000 mg | DELAYED_RELEASE_CAPSULE | Freq: Every day | ORAL | 12 refills | Status: AC
Start: 2023-11-16 — End: ?

## 2023-11-16 MED ORDER — OXYCODONE HCL 5 MG PO TABS: 5.0000 mg | ORAL_TABLET | Freq: Once | ORAL | Status: DC | PRN

## 2023-11-16 MED ORDER — LIDOCAINE 2% (20 MG/ML) 5 ML SYRINGE
INTRAMUSCULAR | Status: DC | PRN
  Administered 2023-11-16: 100 mg via INTRAVENOUS

## 2023-11-16 MED ORDER — PROPOFOL 500 MG/50ML IV EMUL
INTRAVENOUS | Status: AC
  Filled 2023-11-16: qty 100

## 2023-11-16 MED ORDER — FENTANYL CITRATE (PF) 100 MCG/2ML IJ SOLN: 25.0000 ug | INTRAMUSCULAR | Status: DC | PRN

## 2023-11-16 MED ORDER — PROPOFOL 10 MG/ML IV BOLUS
INTRAVENOUS | Status: AC
Start: 2023-11-16 — End: ?
  Filled 2023-11-16: qty 20

## 2023-11-16 MED ORDER — PROPOFOL 10 MG/ML IV BOLUS
INTRAVENOUS | Status: DC | PRN
  Administered 2023-11-16: 20 mg via INTRAVENOUS

## 2023-11-16 MED ORDER — PROPOFOL 500 MG/50ML IV EMUL
INTRAVENOUS | Status: AC
  Filled 2023-11-16: qty 50

## 2023-11-16 MED ORDER — SODIUM CHLORIDE 0.9 % IV SOLN: INTRAVENOUS | Status: DC

## 2023-11-16 MED ORDER — SODIUM CHLORIDE 0.9 % IV SOLN
INTRAVENOUS | Status: AC | PRN
  Administered 2023-11-16: 500 mL via INTRAMUSCULAR

## 2023-11-16 MED ORDER — DROPERIDOL 2.5 MG/ML IJ SOLN: 0.6250 mg | Freq: Once | INTRAMUSCULAR | Status: DC | PRN

## 2023-11-16 MED ORDER — PROPOFOL 500 MG/50ML IV EMUL
INTRAVENOUS | Status: DC | PRN
  Administered 2023-11-16: 150 ug/kg/min via INTRAVENOUS

## 2023-11-16 MED ORDER — PROPOFOL 1000 MG/100ML IV EMUL
INTRAVENOUS | Status: AC
  Filled 2023-11-16: qty 200

## 2023-11-16 MED ORDER — OXYCODONE HCL 5 MG/5ML PO SOLN: 5.0000 mg | Freq: Once | ORAL | Status: DC | PRN

## 2023-11-16 NOTE — Discharge Instructions (Signed)

## 2023-11-16 NOTE — H&P (Signed)
 GASTROENTEROLOGY PROCEDURE H&P NOTE   Primary Care Physician: Sandford Craze, NP  HPI: Jeremy [name] is a 68 y.o. male who presents for EGD/EUS to followup previous gastric nodule and for dysphagia.  Past Medical History:  Diagnosis Date   Anemia 11/16/2014   Anxiety    Aortic aneurysm (HCC)    4.1 cm 2019, ascending aorta   Arthritis    Bladder disease    "an unusual bladder disease; don't know what it's called" (06/30/2015)   Bulging lumbar disc    Cataract    COVID-19 01/09/2021   Depression    GERD (gastroesophageal reflux disease)    History of hiatal hernia    Hyperglycemia 11/16/2014   Hyperlipemia    Hypertension    Joint pain, knee    Migraine    06/30/2015 "maybe 2-3 times/year; not as bad as I used to have them"   OSA (obstructive sleep apnea)    Sleep apnea    Past Surgical History:  Procedure Laterality Date   ESOPHAGOGASTRODUODENOSCOPY     ESOPHAGOGASTRODUODENOSCOPY (EGD) WITH ESOPHAGEAL DILATION  ~ 2014   JOINT REPLACEMENT     right knee   KNEE ARTHROSCOPY Right 08/23/1993   NASAL SEPTUM SURGERY  ~ 1972   SPINE SURGERY     TOTAL KNEE ARTHROPLASTY  10/11/2011   Procedure: TOTAL KNEE ARTHROPLASTY;  Surgeon: Raymon Mutton, MD;  Location: [facility] OR;  Service: Orthopedics;  Laterality: Right;   TRANSFORAMINAL LUMBAR INTERBODY FUSION W/ MIS 1 LEVEL Right 03/31/2021   Procedure: Right Lumbar five Sacral one Minimally invasive transforaminal lumbar interbody fusion;  Surgeon: Jadene Pierini, MD;  Location: [facility] OR;  Service: Neurosurgery;  Laterality: Right;   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)  ~2014   Current Facility-Administered Medications  Medication Dose Route Frequency Provider Last Rate Last Admin   0.9 %  sodium chloride infusion   Intravenous Continuous Mansouraty, Netty Starring., MD       0.9 %  sodium chloride infusion    Continuous PRN Mansouraty, Netty Starring., MD 10 mL/hr at 11/16/23 0704 500 mL at 11/16/23  0704   acetaminophen (OFIRMEV) IV 1,000 mg  1,000 mg Intravenous Once PRN [place] Nation, MD       droperidol (INAPSINE) 2.5 MG/ML injection 0.625 mg  0.625 mg Intravenous Once PRN [place] Nation, MD       fentaNYL (SUBLIMAZE) injection 25-50 mcg  25-50 mcg Intravenous Q5 min PRN [place] Nation, MD       oxyCODONE (Oxy IR/ROXICODONE) immediate release tablet 5 mg  5 mg Oral Once PRN [place] Nation, MD       Or   oxyCODONE (ROXICODONE) 5 MG/5ML solution 5 mg  5 mg Oral Once PRN [place] Nation, MD        Current Facility-Administered Medications:    0.9 %  sodium chloride infusion, , Intravenous, Continuous, Mansouraty, Netty Starring., MD   0.9 %  sodium chloride infusion, , , Continuous PRN, Mansouraty, Netty Starring., MD, Last Rate: 10 mL/hr at 11/16/23 0704, 500 mL at 11/16/23 0704   acetaminophen (OFIRMEV) IV 1,000 mg, 1,000 mg, Intravenous, Once PRN, [place] Nation, MD   droperidol (INAPSINE) 2.5 MG/ML injection 0.625 mg, 0.625 mg, Intravenous, Once PRN, [place] Nation, MD   fentaNYL (SUBLIMAZE) injection 25-50 mcg, 25-50 mcg, Intravenous, Q5 min PRN,  Nation, MD   oxyCODONE (Oxy IR/ROXICODONE) immediate release tablet 5 mg, 5 mg, Oral, Once PRN **OR** oxyCODONE (ROXICODONE)  5 MG/5ML solution 5 mg, 5 mg, Oral, Once PRN, [place] Nation, MD No Known Allergies Family History  Problem Relation Age of Onset   Stroke Mother 1       Died of CVA   Hypertension Mother    Anesthesia problems Neg Hx    Hypotension Neg Hx    Malignant hyperthermia Neg Hx    Pseudochol deficiency Neg Hx    Esophageal cancer Neg Hx    Colon cancer Neg Hx    Stomach cancer Neg Hx    Inflammatory bowel disease Neg Hx    Liver disease Neg Hx    Pancreatic cancer Neg Hx    Rectal cancer Neg Hx    Social History   Socioeconomic History   Marital status: Married    Spouse name: Not on file   Number of children: 3   Years of education: Not on file   Highest education level: GED or equivalent  Occupational History   Not on  file  Tobacco Use   Smoking status: Former    Current packs/day: 0.00    Average packs/day: 1.5 packs/day for 6.0 years (9.0 ttl pk-yrs)    Types: Cigarettes    Start date: 12/01/1970    Quit date: 11/30/1976    Years since quitting: 46.9   Smokeless tobacco: Never   Tobacco comments:    "stopped smoking in ~ 1990"  Vaping Use   Vaping status: Never Used  Substance and Sexual Activity   Alcohol use: No    Alcohol/week: 0.0 standard drinks of alcohol   Drug use: No   Sexual activity: Yes  Other Topics Concern   Not on file  Social History Narrative   3 children    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   Completed 12th grade   Enjoys softball   Social Drivers of Corporate investment banker Strain: Low Risk  (01/12/2023)   Overall Financial Resource Strain (CARDIA)    Difficulty of Paying Living Expenses: Not hard at all  Food Insecurity: Low Risk  (04/05/2023)   Received from [facility]   Hunger Vital Sign    Worried About Running Out of Food in the Last Year: Never true    Ran Out of Food in the Last Year: Never true  Transportation Needs: Not on file (04/05/2023)  Physical Activity: Inactive (01/12/2023)   Exercise Vital Sign    Days of Exercise per Week: 0 days    Minutes of Exercise per Session: 0 min  Stress: No Stress Concern Present (01/12/2023)   [employer] of Occupational Health - Occupational Stress Questionnaire    Feeling of Stress : Not at all  Social Connections: Socially Integrated (01/12/2023)   Social Connection and Isolation Panel [NHANES]    Frequency of Communication with Friends and Family: More than three times a week    Frequency of Social Gatherings with Friends and Family: More than three times a week    Attends Religious Services: More than 4 times per year    Active Member of [employer] or Organizations: Yes    Attends Banker Meetings: More than 4 times per year    Marital Status: Married  Careers information officer Violence: Not At Risk (01/19/2023)   Humiliation, Afraid, Rape, and Kick questionnaire    Fear of Current or Ex-Partner: No    Emotionally Abused: No    Physically Abused: No    Sexually Abused: No  Physical Exam: Today's Vitals   11/16/23 0659  BP: 134/82  Pulse: 73  Resp: (!) 24  Temp: 97.9 F (36.6 C)  TempSrc: Tympanic  SpO2: 94%  Weight: 125.3 kg  Height: 5\' 6"  (1.676 m)  PainSc: 0-No pain   Body mass index is 44.59 kg/m. GEN: NAD EYE: Sclerae anicteric ENT: MMM CV: Non-tachycardic GI: Soft, NT/ND NEURO:  Alert & Oriented x 3  Lab Results: No results for input(s): "WBC", "HGB", "HCT", "PLT" in the last 72 hours. BMET No results for input(s): "NA", "K", "CL", "CO2", "GLUCOSE", "BUN", "CREATININE", "CALCIUM" in the last 72 hours. LFT No results for input(s): "PROT", "ALBUMIN", "AST", "ALT", "ALKPHOS", "BILITOT", "BILIDIR", "IBILI" in the last 72 hours. PT/INR No results for input(s): "LABPROT", "INR" in the last 72 hours.   Impression / Plan: This is a 68 y.o.male who presents for EGD/EUS to followup previous gastric nodule and for dysphagia.  The risks of an EUS including intestinal perforation, bleeding, infection, aspiration, and medication effects were discussed as was the possibility it may not give a definitive diagnosis if a biopsy is performed.  When a biopsy of the pancreas is done as part of the EUS, there is an additional risk of pancreatitis at the rate of about 1-2%.  It was explained that procedure related pancreatitis is typically mild, although it can be severe and even life threatening, which is why we do not perform random pancreatic biopsies and only biopsy a lesion/area we feel is concerning enough to warrant the risk.  The risks and benefits of endoscopic evaluation/treatment were discussed with the patient and/or family; these include but are not limited to the risk of perforation, infection, bleeding, missed lesions, lack of diagnosis,  severe illness requiring hospitalization, as well as anesthesia and sedation related illnesses.  The patient's history has been reviewed, patient examined, no change in status, and deemed stable for procedure.  The patient and/or family is agreeable to proceed.    Corliss Parish, MD [facility] Office # [phone]

## 2023-11-16 NOTE — Transfer of Care (Signed)
 Immediate Anesthesia Transfer of Care Note  Patient: Jeremy [name]  Procedure(s) Performed: UPPER ESOPHAGEAL ENDOSCOPIC ULTRASOUND (EUS) EGD, WITH DILATION USING SAVARY-GILLIARD DILATOR OVER GUIDEWIRE EGD (ESOPHAGOGASTRODUODENOSCOPY)  Patient Location: Endoscopy Unit  Anesthesia Type:MAC  Level of Consciousness: drowsy and patient cooperative  Airway & Oxygen Therapy: Patient Spontanous Breathing and Patient connected to face mask oxygen  Post-op Assessment: Report given to RN and Post -op Vital signs reviewed and stable  Post vital signs: Reviewed and stable  Last Vitals:  Vitals Value Taken Time  BP 115/62   Temp    Pulse 56   Resp 14   SpO2 99%     Last Pain:  Vitals:   11/16/23 0659  TempSrc: Tympanic  PainSc: 0-No pain         Complications: No notable events documented.

## 2023-11-16 NOTE — Op Note (Signed)
 [facility] Patient Name: Jeremy [name] Procedure Date: 11/16/2023 MRN: [id number] Attending MD: Corliss Parish , MD, [id number] Date of Birth: Jan 31, 1956 CSN: [id number] Age: 68 Admit Type: Outpatient Procedure:                Upper EUS Indications:              Gastric deformity on endoscopy/Subepithelial tumor                            versus extrinsic compression, Iron deficiency                            anemia, Dysphagia Providers:                Corliss Parish, MD, Zoe Lan, RN, Marja Kays, Technician Referring MD:              Medicines:                Monitored Anesthesia Care Complications:            No immediate complications. Estimated Blood Loss:     Estimated blood loss was minimal. Procedure:                Pre-Anesthesia Assessment:                           - Prior to the procedure, a History and Physical                            was performed, and patient medications and                            allergies were reviewed. The patient's tolerance of                            previous anesthesia was also reviewed. The risks                            and benefits of the procedure and the sedation                            options and risks were discussed with the patient.                            All questions were answered, and informed consent                            was obtained. Prior Anticoagulants: The patient has                            taken no anticoagulant or antiplatelet agents                            except for aspirin. ASA Grade Assessment:  III - A                            patient with severe systemic disease. After                            reviewing the risks and benefits, the patient was                            deemed in satisfactory condition to undergo the                            procedure.                           After obtaining informed consent, the endoscope  was                            passed under direct vision. Throughout the                            procedure, the patient's blood pressure, pulse, and                            oxygen saturations were monitored continuously. The                            [id number] ([id number]) Olympus therapeutic endoscope                            was introduced through the mouth, and advanced to                            the second part of duodenum. The [id number]                            ([id number]) Olympus radial ultrasound scope was                            introduced through the mouth, and advanced to the                            stomach for ultrasound examination from the                            esophagus and stomach. The upper EUS was                            accomplished without difficulty. The patient                            tolerated the procedure. Scope In: Scope Out: Findings:      ENDOSCOPIC FINDING: :      No gross lesions were noted in the proximal esophagus and in the mid  esophagus.      A scattered island of salmon-colored mucosa was present at 36 cm. No       other visible abnormalities were present. Biopsies were taken with a       cold forceps for histology to rule in/out Barrett's.      A non-obstructing Schatzki ring was found at the gastroesophageal       junction. Circumferential biopsies were taken with a cold forceps for       disruption purposes.      A 2 cm hiatal hernia was present.      A small, submucosal nodule with no bleeding was found in the cardia.      Striped mildly erythematous mucosa without bleeding was found in the       entire examined stomach. Biopsies were taken with a cold forceps for       histology and Helicobacter pylori testing.      No gross lesions were noted in the duodenal bulb, in the first portion       of the duodenum and in the second portion of the duodenum. Biopsies for       histology were taken with a cold forceps  for evaluation of celiac       disease.      After the rest of the EGD was completed, a guidewire was placed and the       scope was withdrawn. Dilation was performed in the esophagus with a       Savary dilator with no resistance at 18 mm. The dilation site was       examined following endoscope reinsertion and showed no change.      ENDOSONOGRAPHIC FINDING: :      A round intramural (subepithelial) lesion was found in the cardia of the       stomach. It was encountered at 2.5-3 cm distal to the gastroesophageal       junction. The lesion was hypoechoic, calcified and shadowing.       Sonographically, the lesion appeared to originate from the muscularis       propria (Layer 4). The lesion measured 15 mm (in maximum thickness). The       lesion also measured 11 mm in diameter. The outer endosonographic       borders were well defined.      Endosonographic imaging in the rest of the fundus of the stomach and in       the body of the stomach showed no extrinsic compression, intramural       (subepithelial) lesion, mass, varices or wall thickening.      Endosonographic imaging in the gastroesophageal junction showed no       extrinsic compression, intramural (subepithelial) lesion, mass,       stricture, varices or wall thickening.      No malignant-appearing lymph nodes were visualized in the paracardial       region (level 16), left gastric region (level 17), gastrohepatic       ligament (level 18), splenic region (level 19) and celiac region (level       20).      Endosonographic imaging in the visualized portion of the liver showed no       mass.      The celiac region was visualized. Impression:               EGD impression:                           -  No gross lesions in the proximal esophagus and in                            the mid esophagus.                           - Salmon-colored mucosa suspicious for                            short-segment Barrett's esophagus found  distally.                            Biopsied.                           - Non-obstructing Schatzki ring. Disrupted.                           - Dilation performed in the esophagus up to 18 mm                            savory.                           - 2 cm hiatal hernia.                           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                           - Erythematous mucosa in the stomach. Biopsied.                           - No gross lesions in the duodenal bulb, in the                            first portion of the duodenum and in the second                            portion of the duodenum. Biopsied.                           EUS impression:                           - An intramural (subepithelial) lesion was found in                            the cardia of the stomach. The lesion appeared to                            originate from within the muscularis propria (Layer                            4). Tissue has not been obtained. 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 This lesion has been followed with EUS                            at [facility] over 10 years but last                            evaluation was 3 years ago. There is suggestion                            that this may have increased in size slightly from                            last EUS.                           - No malignant-appearing lymph nodes were                            visualized in the paracardial region (level 16),                            left gastric region (level 17), gastrohepatic                            ligament (level 18), splenic region (level 19) and                            celiac region (level 20). Moderate Sedation:      Not Applicable - Patient had care per Anesthesia. Recommendation:            - The patient will be observed post-procedure,                            until all discharge criteria are met.                           - Discharge patient to home.                           - Patient has a contact number available for                            emergencies. The signs and symptoms of potential                            delayed complications were discussed with the  patient. Return to normal activities tomorrow.                            Written discharge instructions were provided to the                            patient.                           - Resume previous diet.                           - Observe patient's clinical course.                           - Await path results.                           - Repeat the upper endoscopic ultrasound in 1 year                            for surveillance, to monitor overall stability of                            size and consider repeat sampling.                           - The findings and recommendations were discussed                            with the patient.                           - The findings and recommendations were discussed                            with the patient's family. Procedure Code(s):        --- Professional ---                           [phone], Esophagogastroduodenoscopy, flexible,                            transoral; with endoscopic ultrasound examination                            limited to the esophagus, stomach or duodenum, and                            adjacent structures                           43248, 51, Esophagogastroduodenoscopy, flexible,                            transoral; with insertion of guide wire followed by  passage of dilator(s) through esophagus over guide                            wire                           43239, 59, Esophagogastroduodenoscopy, flexible,                            transoral; with biopsy,  single or multiple Diagnosis Code(s):        --- Professional ---                           K22.89, Other specified disease of esophagus                           K22.2, Esophageal obstruction                           K44.9, Diaphragmatic hernia without obstruction or                            gangrene                           D49.0, Neoplasm of unspecified behavior of                            digestive system                           K31.89, Other diseases of stomach and duodenum                           I89.9, Noninfective disorder of lymphatic vessels                            and lymph nodes, unspecified                           D50.9, Iron deficiency anemia, unspecified                           R13.10, Dysphagia, unspecified CPT copyright 2022 American Medical Association. All rights reserved. The codes documented in this report are preliminary and upon coder review may  be revised to meet current compliance requirements. Corliss Parish, MD 11/16/2023 9:06:53 AM Number of Addenda: 0

## 2023-11-16 NOTE — Anesthesia Postprocedure Evaluation (Signed)
 Anesthesia Post Note  Patient: Jeremy [name]  Procedure(s) Performed: UPPER ESOPHAGEAL ENDOSCOPIC ULTRASOUND (EUS) EGD, WITH DILATION USING SAVARY-GILLIARD DILATOR OVER GUIDEWIRE EGD (ESOPHAGOGASTRODUODENOSCOPY)     Patient location during evaluation: PACU Anesthesia Type: MAC Level of consciousness: awake and alert Pain management: pain level controlled Vital Signs Assessment: post-procedure vital signs reviewed and stable Respiratory status: spontaneous breathing, nonlabored ventilation, respiratory function stable and patient connected to nasal cannula oxygen Cardiovascular status: stable and blood pressure returned to baseline Postop Assessment: no apparent nausea or vomiting Anesthetic complications: no   No notable events documented.  Last Vitals:  Vitals:   11/16/23 0920 11/16/23 0925  BP: 126/78   Pulse: 60 (!) 59  Resp: 18 19  Temp:    SpO2: 93% 94%    Last Pain:  Vitals:   11/16/23 0920  TempSrc:   PainSc: 0-No pain                 [place] Nation

## 2023-11-17 ENCOUNTER — Encounter (HOSPITAL_COMMUNITY): Payer: Self-pay | Admitting: Gastroenterology

## 2023-11-17 LAB — SURGICAL PATHOLOGY

## 2023-11-18 ENCOUNTER — Encounter: Payer: Self-pay | Admitting: Gastroenterology

## 2023-11-24 ENCOUNTER — Telehealth: Payer: Self-pay | Admitting: Family

## 2023-11-24 NOTE — Telephone Encounter (Signed)
 Copied from CRM [id number]. Topic: Medicare AWV >> Nov 24, 2023  1:23 PM Payton Doughty wrote: Reason for CRM: Called LVM 11/24/2023 to schedule AWV. Please schedule Virtual or Telehealth visits ONLY  Verlee Rossetti; Care Guide Ambulatory Clinical Support [place] [facility] Direct Dial: [phone]

## 2023-11-25 ENCOUNTER — Other Ambulatory Visit: Payer: Self-pay | Admitting: Family

## 2023-11-30 ENCOUNTER — Other Ambulatory Visit (HOSPITAL_BASED_OUTPATIENT_CLINIC_OR_DEPARTMENT_OTHER): Payer: Self-pay

## 2023-11-30 ENCOUNTER — Ambulatory Visit (INDEPENDENT_AMBULATORY_CARE_PROVIDER_SITE_OTHER): Admitting: Family

## 2023-11-30 ENCOUNTER — Encounter: Payer: Self-pay | Admitting: Family

## 2023-11-30 VITALS — BP 120/64 | HR 63 | Temp 97.4°F | Resp 18 | Ht 66.0 in | Wt 273.0 lb

## 2023-11-30 DIAGNOSIS — E785 Hyperlipidemia, unspecified: Secondary | ICD-10-CM

## 2023-11-30 DIAGNOSIS — E66813 Obesity, class 3: Secondary | ICD-10-CM | POA: Diagnosis not present

## 2023-11-30 DIAGNOSIS — I719 Aortic aneurysm of unspecified site, without rupture: Secondary | ICD-10-CM

## 2023-11-30 DIAGNOSIS — Z23 Encounter for immunization: Secondary | ICD-10-CM | POA: Diagnosis not present

## 2023-11-30 DIAGNOSIS — J309 Allergic rhinitis, unspecified: Secondary | ICD-10-CM | POA: Diagnosis not present

## 2023-11-30 DIAGNOSIS — G4733 Obstructive sleep apnea (adult) (pediatric): Secondary | ICD-10-CM

## 2023-11-30 DIAGNOSIS — F419 Anxiety disorder, unspecified: Secondary | ICD-10-CM | POA: Diagnosis not present

## 2023-11-30 DIAGNOSIS — R739 Hyperglycemia, unspecified: Secondary | ICD-10-CM

## 2023-11-30 DIAGNOSIS — Z862 Personal history of diseases of the blood and blood-forming organs and certain disorders involving the immune mechanism: Secondary | ICD-10-CM

## 2023-11-30 DIAGNOSIS — I5032 Chronic diastolic (congestive) heart failure: Secondary | ICD-10-CM

## 2023-11-30 DIAGNOSIS — F32A Depression, unspecified: Secondary | ICD-10-CM

## 2023-11-30 DIAGNOSIS — I10 Essential (primary) hypertension: Secondary | ICD-10-CM | POA: Diagnosis not present

## 2023-11-30 DIAGNOSIS — K219 Gastro-esophageal reflux disease without esophagitis: Secondary | ICD-10-CM | POA: Diagnosis not present

## 2023-11-30 DIAGNOSIS — Z6841 Body Mass Index (BMI) 40.0 and over, adult: Secondary | ICD-10-CM

## 2023-11-30 LAB — COMPREHENSIVE METABOLIC PANEL WITH GFR
ALT: 27 U/L (ref 0–53)
AST: 20 U/L (ref 0–37)
Albumin: 4.4 g/dL (ref 3.5–5.2)
Alkaline Phosphatase: 97 U/L (ref 39–117)
BUN: 16 mg/dL (ref 6–23)
CO2: 32 meq/L (ref 19–32)
Calcium: 9.1 mg/dL (ref 8.4–10.5)
Chloride: 101 meq/L (ref 96–112)
Creatinine, Ser: 0.99 mg/dL (ref 0.40–1.50)
GFR: 78 mL/min (ref >60.00)
Glucose, Bld: 94 mg/dL (ref 70–99)
Potassium: 4 meq/L (ref 3.5–5.1)
Sodium: 142 meq/L (ref 135–145)
Total Bilirubin: 0.5 mg/dL (ref 0.2–1.2)
Total Protein: 6.5 g/dL (ref 6.0–8.3)

## 2023-11-30 LAB — HEMOGLOBIN A1C: Hgb A1c MFr Bld: 6.2 % (ref 4.6–6.5)

## 2023-11-30 MED ORDER — COVID-19 MRNA VAC-TRIS(PFIZER) 30 MCG/0.3ML IM SUSY
0.3000 mL | PREFILLED_SYRINGE | Freq: Once | INTRAMUSCULAR | 0 refills | Status: AC
Start: 2023-11-30 — End: 2023-12-01
  Filled 2023-11-30: qty 0.3, 1d supply, fill #0

## 2023-11-30 NOTE — Addendum Note (Signed)
 Addended by: Wilford Corner on: 11/30/2023 09:08 AM   Modules accepted: Orders

## 2023-11-30 NOTE — Progress Notes (Signed)
 Subjective:     [name], male    DOB: 11/30/1955, 68 y.o.   MRN: [id number]  Chief Complaint  Patient presents with   Hypertension    Here for follow up    Hypertension    Discussed the use of AI scribe software for clinical note transcription with the patient, who gave verbal consent to proceed.  History of Present Illness  Jeremy [name] "Jeremy [name]" is a 68 year old male who presents for a medication follow-up and routine health maintenance.  He continues to take Allegra daily for mild allergic rhinitis, experiencing only a little rhinorrhea and occasional sneezing. His mood, anxiety, and depression are well-managed with sertraline 150 mg daily. His blood pressure is well-controlled with Lotrel, hydrochlorothiazide, and metoprolol.  He mentions gaining weight despite regular exercise, walking on a treadmill three to five days a week, usually covering a mile per session. He acknowledges being a 'snacker' and drinks a Pepsi after walking. His triglycerides were noted to be a little high in November.  His blood count was normal when checked three months ago.  Regarding his gastroesophageal reflux disease, his esophagus was recently checked, and his omeprazole dose was increased to 40 mg. He reports no current problems with acid reflux.  He continues to manage his sleep apnea with Dr. Maple Hudson and is awaiting a new CPAP machine as his current one is 69 years old. He reports a 'little wheeze' today but has not used an inhaler and does not have one at home. Declines rx for albuterol today.      Health Maintenance Due  Topic Date Due   Pneumonia Vaccine 80+ Years old (2 of 2 - PCV) 03/17/2021   COVID-19 Vaccine (6 - Moderna risk 2024-25 season) 10/22/2023   Medicare Annual Wellness (AWV)  01/19/2024    Past Medical History:  Diagnosis Date   Anemia 11/16/2014   Anxiety    Aortic aneurysm (HCC)    4.1 cm 2019, ascending aorta   Arthritis    Bladder disease     "an unusual bladder disease; don't know what it's called" (06/30/2015)   Bulging lumbar disc    Cataract    COVID-19 01/09/2021   Depression    GERD (gastroesophageal reflux disease)    History of hiatal hernia    Hyperglycemia 11/16/2014   Hyperlipemia    Hypertension    Joint pain, knee    Migraine    06/30/2015 "maybe 2-3 times/year; not as bad as I used to have them"   OSA (obstructive sleep apnea)    Sleep apnea     Past Surgical History:  Procedure Laterality Date   ESOPHAGOGASTRODUODENOSCOPY     ESOPHAGOGASTRODUODENOSCOPY N/A 11/16/2023   Procedure: EGD (ESOPHAGOGASTRODUODENOSCOPY);  Surgeon: Lemar Lofty., MD;  Location: [facility];  Service: Gastroenterology;  Laterality: N/A;   ESOPHAGOGASTRODUODENOSCOPY (EGD) WITH ESOPHAGEAL DILATION  ~ 2014   JOINT REPLACEMENT     right knee   KNEE ARTHROSCOPY Right 08/23/1993   NASAL SEPTUM SURGERY  ~ 1972   SAVORY DILATION N/A 11/16/2023   Procedure: EGD, WITH DILATION USING SAVARY-GILLIARD DILATOR OVER GUIDEWIRE;  Surgeon: Mansouraty, Netty Starring., MD;  Location: [facility];  Service: Gastroenterology;  Laterality: N/A;   SPINE SURGERY     TOTAL KNEE ARTHROPLASTY  10/11/2011   Procedure: TOTAL KNEE ARTHROPLASTY;  Surgeon: Raymon Mutton, MD;  Location: [facility];  Service: Orthopedics;  Laterality: Right;   TRANSFORAMINAL LUMBAR INTERBODY FUSION W/ MIS 1 LEVEL Right  03/31/2021   Procedure: Right Lumbar five Sacral one Minimally invasive transforaminal lumbar interbody fusion;  Surgeon: Jadene Pierini, MD;  Location: [facility];  Service: Neurosurgery;  Laterality: Right;   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)  ~2014   UPPER ESOPHAGEAL ENDOSCOPIC ULTRASOUND (EUS) N/A 11/16/2023   Procedure: UPPER ESOPHAGEAL ENDOSCOPIC ULTRASOUND (EUS);  Surgeon: Lemar Lofty., MD;  Location: [facility];  Service: Gastroenterology;  Laterality: N/A;    Family History  Problem Relation Age of Onset    Stroke Mother 94       Died of CVA   Hypertension Mother    Anesthesia problems Neg Hx    Hypotension Neg Hx    Malignant hyperthermia Neg Hx    Pseudochol deficiency Neg Hx    Esophageal cancer Neg Hx    Colon cancer Neg Hx    Stomach cancer Neg Hx    Inflammatory bowel disease Neg Hx    Liver disease Neg Hx    Pancreatic cancer Neg Hx    Rectal cancer Neg Hx     Social History   Socioeconomic History   Marital status: Married    Spouse name: Not on file   Number of children: 3   Years of education: Not on file   Highest education level: GED or equivalent  Occupational History   Not on file  Tobacco Use   Smoking status: Former    Current packs/day: 0.00    Average packs/day: 1.5 packs/day for 6.0 years (9.0 ttl pk-yrs)    Types: Cigarettes    Start date: 12/01/1970    Quit date: 11/30/1976    Years since quitting: 47.0   Smokeless tobacco: Never   Tobacco comments:    "stopped smoking in ~ 1990"  Vaping Use   Vaping status: Never Used  Substance and Sexual Activity   Alcohol use: No    Alcohol/week: 0.0 standard drinks of alcohol   Drug use: No   Sexual activity: Yes  Other Topics Concern   Not on file  Social History Narrative   3 children    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   Completed 12th grade   Enjoys softball   Social Drivers of Corporate investment banker Strain: Low Risk  (01/12/2023)   Overall Financial Resource Strain (CARDIA)    Difficulty of Paying Living Expenses: Not hard at all  Food Insecurity: Low Risk  (04/05/2023)   Received from [facility]   Hunger Vital Sign    Worried About Running Out of Food in the Last Year: Never true    Ran Out of Food in the Last Year: Never true  Transportation Needs: Not on file (04/05/2023)  Physical Activity: Inactive (01/12/2023)   Exercise Vital Sign    Days of Exercise per Week: 0 days    Minutes of Exercise per Session: 0 min  Stress: No Stress Concern Present  (01/12/2023)   [employer] of Occupational Health - Occupational Stress Questionnaire    Feeling of Stress : Not at all  Social Connections: Socially Integrated (01/12/2023)   Social Connection and Isolation Panel [NHANES]    Frequency of Communication with Friends and Family: More than three times a week    Frequency of Social Gatherings with Friends and Family: More than three times a week    Attends Religious Services: More than 4 times per year    Active Member of [employer] or Organizations: Yes  Attends Banker Meetings: More than 4 times per year    Marital Status: Married  Catering manager Violence: Not At Risk (01/19/2023)   Humiliation, Afraid, Rape, and Kick questionnaire    Fear of Current or Ex-Partner: No    Emotionally Abused: No    Physically Abused: No    Sexually Abused: No    Outpatient Medications Prior to Visit  Medication Sig Dispense Refill   amLODipine-benazepril (LOTREL) 10-40 MG capsule Take 1 capsule by mouth once daily 90 capsule 0   ARIPiprazole (ABILIFY) 5 MG tablet Take 1 tablet (5 mg total) by mouth daily. 90 tablet 1   ascorbic acid (VITAMIN C) 500 MG tablet Take by mouth.     aspirin EC 81 MG tablet Take 1 tablet (81 mg total) by mouth daily. Swallow whole. 90 tablet 3   b complex vitamins tablet Take 1 tablet by mouth daily.     ferrous sulfate 325 (65 FE) MG tablet Take 1 tablet (325 mg total) by mouth 2 (two) times daily with a meal. 60 tablet 11   fexofenadine (ALLEGRA) 180 MG tablet Take 1 tablet (180 mg total) by mouth daily. 30 tablet 0   furosemide (LASIX) 20 MG tablet Take 1 tablet by mouth once daily 90 tablet 0   hydrochlorothiazide (HYDRODIURIL) 25 MG tablet Take 1 tablet (25 mg total) by mouth daily. 90 tablet 0   metoprolol succinate (TOPROL-XL) 50 MG 24 hr tablet Take one tablet daily (50MG ) with or immediately following a meal. 90 tablet 1   Multiple Vitamin (MULTIVITAMIN WITH MINERALS) TABS tablet Take 1 tablet by mouth  daily.     omeprazole (PRILOSEC) 40 MG capsule Take 1 capsule (40 mg total) by mouth daily. 30 capsule 12   potassium chloride (KLOR-CON) 10 MEQ tablet TAKE 2 TABLETS BY MOUTH IN THE MORNING AND 1 IN THE EVENING 270 tablet 0   sertraline (ZOLOFT) 100 MG tablet Take 1.5 tablets (150 mg total) by mouth daily. 135 tablet 1   albuterol (VENTOLIN HFA) 108 (90 Base) MCG/ACT inhaler INHALE 2 PUFFS INTO LUNGS EVERY 6 HOURS AS NEEDED FOR WHEEZING OR FOR SHORTNESS OF BREATH. 9 g 0   benzonatate (TESSALON) 100 MG capsule Take 1 capsule (100 mg total) by mouth 3 (three) times daily as needed. 20 capsule 0   ezetimibe (ZETIA) 10 MG tablet Take 1 tablet (10 mg total) by mouth daily. 90 tablet 3   rosuvastatin (CRESTOR) 40 MG tablet Take 1 tablet (40 mg total) by mouth daily. 90 tablet 3   No facility-administered medications prior to visit.    No Known Allergies  ROS See HPI    Objective:    Physical Exam Constitutional:      General: He is not in acute distress.    Appearance: He is well-developed.  HENT:     Head: Normocephalic and atraumatic.  Cardiovascular:     Rate and Rhythm: Normal rate and regular rhythm.     Heart sounds: No murmur heard. Pulmonary:     Effort: Pulmonary effort is normal. No respiratory distress.     Breath sounds: Wheezing (soft bilateral expiratory wheeze) present. No rales.  Skin:    General: Skin is warm and dry.  Neurological:     Mental Status: He is alert and oriented to person, place, and time.  Psychiatric:        Behavior: Behavior normal.        Thought Content: Thought content normal.  BP 120/64 (BP Location: Right Arm, Patient Position: Sitting, Cuff Size: Large)   Pulse 63   Temp (!) 97.4 F (36.3 C) (Oral)   Resp 18   Ht 5\' 6"  (1.676 m)   Wt 273 lb (123.8 kg)   SpO2 96%   BMI 44.06 kg/m  Wt Readings from Last 3 Encounters:  11/30/23 273 lb (123.8 kg)  11/16/23 276 lb 3.8 oz (125.3 kg)  10/10/23 276 lb 3.2 oz (125.3 kg)        Assessment & Plan:   Problem List Items Addressed This Visit       Unprioritized   OSA (obstructive sleep apnea)   Reports good compliance with cpap. It does not appear that his insurance covers zepbound for OSA unfortunately.       Hyperlipidemia   Lab Results  Component Value Date   CHOL 119 07/05/2023   HDL 36 (L) 07/05/2023   LDLCALC 51 07/05/2023   LDLDIRECT 129.0 01/24/2023   TRIG 196 (H) 07/05/2023   CHOLHDL 3.3 07/05/2023  Continue crestor and zetia.  Discussed diet to lower trigs.       Relevant Orders   Comp Met (CMET)   Hyperglycemia   Lab Results  Component Value Date   HGBA1C 5.2 01/24/2023   HGBA1C 5.7 07/26/2022   HGBA1C 5.4 03/15/2022   Lab Results  Component Value Date   LDLCALC 51 07/05/2023   CREATININE 0.94 01/24/2023         HTN (hypertension)   BP Readings from Last 3 Encounters:  11/30/23 120/64  11/16/23 126/78  10/10/23 (!) 144/84   Stable, continue meroprolol, lotrel, hydrochlorothiazide.      History of anemia   Lab Results  Component Value Date   WBC 7.9 08/31/2023   HGB 13.8 08/31/2023   HCT 41.1 08/31/2023   MCV 84.2 08/31/2023   PLT 279.0 08/31/2023   Blood count normal.        GERD (gastroesophageal reflux disease)   Clinically stable on omeprazole. Recently increased by GI from 20mg  to 40mg .       Class 3 severe obesity without serious comorbidity with body mass index (BMI) of 40.0 to 44.9 in adult [facility])   Discussed important nutrition strategies to help with weight loss.       Chronic diastolic heart failure (HCC)   Appears clinically compensated.       Aortic aneurysm (HCC)   Due for annual follow up.       Relevant Orders   CT ANGIO CHEST AORTA W/ & OR WO/CM & GATING (Funk ONLY)   Anxiety and depression   Stable on sertraline 150mg .      Allergic rhinitis - Primary   Stable on allegra, continue same.       Prevnar 20 today. Will get covid booster at pharmacy.  I have discontinued  Hosey Burmester. Friedly "Jeremy [name]"'s benzonatate and albuterol. I am also having him maintain his b complex vitamins, multivitamin with minerals, fexofenadine, ascorbic acid, aspirin EC, rosuvastatin, ezetimibe, metoprolol succinate, ferrous sulfate, hydrochlorothiazide, amLODipine-benazepril, ARIPiprazole, sertraline, potassium chloride, omeprazole, and furosemide.  No orders of the defined types were placed in this encounter.

## 2023-11-30 NOTE — Assessment & Plan Note (Signed)
 Discussed important nutrition strategies to help with weight loss.

## 2023-11-30 NOTE — Assessment & Plan Note (Signed)
 Stable on sertraline 150mg .

## 2023-11-30 NOTE — Assessment & Plan Note (Signed)
 Clinically stable on omeprazole. Recently increased by GI from 20mg  to 40mg .

## 2023-11-30 NOTE — Assessment & Plan Note (Signed)
 Appears clinically compensated.

## 2023-11-30 NOTE — Assessment & Plan Note (Signed)
 Reports good compliance with cpap. It does not appear that his insurance covers zepbound for OSA unfortunately.

## 2023-11-30 NOTE — Assessment & Plan Note (Signed)
Due for annual follow-up

## 2023-11-30 NOTE — Assessment & Plan Note (Signed)
Stable on allegra, continue same.

## 2023-11-30 NOTE — Assessment & Plan Note (Signed)
 BP Readings from Last 3 Encounters:  11/30/23 120/64  11/16/23 126/78  10/10/23 (!) 144/84   Stable, continue meroprolol, lotrel, hydrochlorothiazide.

## 2023-11-30 NOTE — Assessment & Plan Note (Addendum)
 Lab Results  Component Value Date   CHOL 119 07/05/2023   HDL 36 (L) 07/05/2023   LDLCALC 51 07/05/2023   LDLDIRECT 129.0 01/24/2023   TRIG 196 (H) 07/05/2023   CHOLHDL 3.3 07/05/2023  Continue crestor and zetia.  Discussed diet to lower trigs.

## 2023-11-30 NOTE — Assessment & Plan Note (Signed)
 Lab Results  Component Value Date   WBC 7.9 08/31/2023   HGB 13.8 08/31/2023   HCT 41.1 08/31/2023   MCV 84.2 08/31/2023   PLT 279.0 08/31/2023   Blood count normal.

## 2023-11-30 NOTE — Patient Instructions (Signed)
 VISIT SUMMARY:  Today, you came in for a medication follow-up and routine health maintenance. We discussed your current medications, weight management, and general health maintenance. Your blood pressure and mood are well-controlled, and your GERD symptoms are managed with your current medication. We also addressed your sleep apnea and allergies.  YOUR PLAN:  -ABDOMINAL AORTIC ANEURYSM: An abdominal aortic aneurysm is a bulge in the aorta, the main blood vessel that runs from the heart through the chest and abdomen. We need to monitor its size, so a repeat ultrasound has been ordered.  -HYPERTENSION: Hypertension is high blood pressure. Your blood pressure is well-controlled with your current medications, so no changes are needed.  -OBESITY: Obesity means having an excess amount of body fat. You mentioned difficulty losing weight despite regular exercise. We recommend tracking your dietary intake, reducing snacking, and continuing your exercise regimen. We also discussed the potential for future weight loss interventions if your insurance coverage changes.  -GASTROESOPHAGEAL REFLUX DISEASE (GERD): GERD is a condition where stomach acid frequently flows back into the tube connecting your mouth and stomach. Your symptoms are well-controlled with omeprazole, so continue taking 40 mg daily.  -ALLERGIC RHINITIS: Allergic rhinitis is an allergic reaction that causes sneezing, congestion, and a runny nose. Your symptoms are mild and well-controlled with Allegra, so continue taking it daily.  -SLEEP APNEA: Sleep apnea is a sleep disorder where breathing repeatedly stops and starts. You are under the care of Dr. Maple Hudson and are awaiting a new CPAP machine. Continue your follow-up with Dr. Maple Hudson for management.  -GENERAL HEALTH MAINTENANCE: For your general health, you are due for a pneumonia vaccination and a COVID booster. Your triglycerides are slightly elevated, so we encourage dietary modifications to  reduce sweets and carbs.  INSTRUCTIONS:  Please get your kidney function checked in the lab today. Schedule a follow-up appointment in six months. Also, receive your pneumonia vaccination today and get your COVID booster downstairs.

## 2023-11-30 NOTE — Assessment & Plan Note (Signed)
 Lab Results  Component Value Date   HGBA1C 5.2 01/24/2023   HGBA1C 5.7 07/26/2022   HGBA1C 5.4 03/15/2022   Lab Results  Component Value Date   LDLCALC 51 07/05/2023   CREATININE 0.94 01/24/2023

## 2023-12-05 ENCOUNTER — Other Ambulatory Visit (HOSPITAL_BASED_OUTPATIENT_CLINIC_OR_DEPARTMENT_OTHER): Payer: Self-pay

## 2023-12-28 ENCOUNTER — Other Ambulatory Visit: Payer: Self-pay | Admitting: Family

## 2024-01-03 ENCOUNTER — Telehealth: Payer: Self-pay | Admitting: Family

## 2024-01-03 ENCOUNTER — Other Ambulatory Visit: Payer: Self-pay | Admitting: Family

## 2024-01-03 NOTE — Telephone Encounter (Signed)
 Copied from CRM [id number]. Topic: Referral - Status >> Jan 03, 2024 11:22 AM Martin[place] E wrote: Reason for CRM: Patient called in regarding the status of his CT referral. Patient stated he has not heard back about getting this appointment scheduled. Callback number for patient is [phone].

## 2024-01-05 NOTE — Telephone Encounter (Signed)
 Can someone check on status of CT please?

## 2024-01-09 ENCOUNTER — Other Ambulatory Visit: Payer: Self-pay | Admitting: Family

## 2024-01-09 NOTE — Telephone Encounter (Signed)
 Chart shows receipt confirmation for medication on 5/13 at the pharmacy. RN called the pt, pt confirmed pharmacy contact him and stated it is ready.

## 2024-01-09 NOTE — Telephone Encounter (Signed)
 Copied from CRM [id number]. Topic: Clinical - Medication Refill >> Jan 09, 2024  9:09 AM Sanjuana Crutch wrote: Medication: metoprolol  succinate (TOPROL -XL) 50 MG 24 hr tablet  Has the patient contacted their pharmacy? Yes (Agent: If no, request that the patient contact the pharmacy for the refill. If patient does not wish to contact the pharmacy document the reason why and proceed with request.) (Agent: If yes, when and what did the pharmacy advise?)  This is the patient's preferred pharmacy:  [id number] - [place], [place] - [address] [place] [address] Phone: [phone] Fax: [phone]  [id number], [place] - [address] AT [facility] [address] [place] [address] Phone: [phone] Fax: [phone]  Is this the correct pharmacy for this prescription? Yes If no, delete pharmacy and type the correct one.   Has the prescription been filled recently? Yes  Is the patient out of the medication? Yes  Has the patient been seen for an appointment in the last year OR does the patient have an upcoming appointment? Yes  Can we respond through MyChart? Yes  Agent: Please be advised that Rx refills may take up to 3 business days. We ask that you follow-up with your pharmacy.

## 2024-01-17 DIAGNOSIS — G4733 Obstructive sleep apnea (adult) (pediatric): Secondary | ICD-10-CM | POA: Diagnosis not present

## 2024-01-24 ENCOUNTER — Other Ambulatory Visit: Payer: Self-pay | Admitting: Family

## 2024-01-24 ENCOUNTER — Ambulatory Visit: Admitting: *Deleted

## 2024-01-24 VITALS — Ht 66.0 in | Wt 273.0 lb

## 2024-01-24 DIAGNOSIS — Z0000 Encounter for general adult medical examination without abnormal findings: Secondary | ICD-10-CM | POA: Diagnosis not present

## 2024-01-24 MED ORDER — HYDROCHLOROTHIAZIDE 25 MG PO TABS: 25.0000 mg | ORAL_TABLET | Freq: Every day | ORAL | 1 refills | Status: DC

## 2024-01-24 NOTE — Progress Notes (Signed)
 Subjective:   Jeremy [name] is a 68 y.o. who presents for a Medicare Wellness preventive visit.  As a reminder, Annual Wellness Visits don't include a physical exam, and some assessments may be limited, especially if this visit is performed virtually. We may recommend an in-person follow-up visit with your provider if needed.  Visit Complete: Virtual I connected with  Jeremy [name] on 01/24/24 by a audio enabled telemedicine application and verified that I am speaking with the correct person using two identifiers.  Patient Location: Home  Provider Location: Office/Clinic  I discussed the limitations of evaluation and management by telemedicine. The patient expressed understanding and agreed to proceed.  Vital Signs: Because this visit was a virtual/telehealth visit, some criteria may be missing or patient reported. Any vitals not documented were not able to be obtained and vitals that have been documented are patient reported.  VideoDeclined- This patient declined Librarian, academic. Therefore the visit was completed with audio only.  Persons Participating in Visit: Patient.  AWV Questionnaire: No: Patient Medicare AWV questionnaire was not completed prior to this visit.  Cardiac Risk Factors include: advanced age (>104men, >29 women);obesity (BMI >30kg/m2);dyslipidemia;hypertension;male gender     Objective:     Today's Vitals   01/24/24 1427  Weight: 273 lb (123.8 kg)  Height: 5\' 6"  (1.676 m)   Body mass index is 44.06 kg/m.     01/24/2024    2:44 PM 11/16/2023    6:59 AM 01/19/2023    3:03 PM 01/07/2022    2:46 PM 03/26/2021   11:02 AM 04/12/2018    9:32 AM 11/28/2017    8:59 PM  Advanced Directives  Does Patient Have a Medical Advance Directive? No No No No No No No  Would patient like information on creating a medical advance directive? No - Patient declined No - Patient declined No - Patient declined No - Patient declined  No - Patient  declined No - Patient declined    Current Medications (verified) Outpatient Encounter Medications as of 01/24/2024  Medication Sig   amLODipine -benazepril  (LOTREL) 10-40 MG capsule Take 1 capsule by mouth once daily   ARIPiprazole  (ABILIFY ) 5 MG tablet Take 1 tablet (5 mg total) by mouth daily.   ascorbic acid (VITAMIN C) 500 MG tablet Take 500 mg by mouth daily.   aspirin  EC 81 MG tablet Take 1 tablet (81 mg total) by mouth daily. Swallow whole.   b complex vitamins tablet Take 1 tablet by mouth daily.   ferrous sulfate  325 (65 FE) MG tablet Take 1 tablet (325 mg total) by mouth 2 (two) times daily with a meal.   fexofenadine  (ALLEGRA ) 180 MG tablet Take 1 tablet (180 mg total) by mouth daily.   furosemide  (LASIX ) 20 MG tablet Take 1 tablet by mouth once daily   hydrochlorothiazide  (HYDRODIURIL ) 25 MG tablet Take 1 tablet (25 mg total) by mouth daily.   metoprolol  succinate (TOPROL -XL) 50 MG 24 hr tablet Take 1 tablet (50 mg total) by mouth daily. Take with or immediately following a meal   Multiple Vitamin (MULTIVITAMIN WITH MINERALS) TABS tablet Take 1 tablet by mouth daily.   omeprazole  (PRILOSEC) 40 MG capsule Take 1 capsule (40 mg total) by mouth daily.   potassium chloride  (KLOR-CON ) 10 MEQ tablet Take 2 tablets (20 mEq total) by mouth in the morning AND 1 tablet (10 mEq total) every evening.   sertraline  (ZOLOFT ) 100 MG tablet Take 1.5 tablets (150 mg total) by mouth daily.  ezetimibe  (ZETIA ) 10 MG tablet Take 1 tablet (10 mg total) by mouth daily.   rosuvastatin  (CRESTOR ) 40 MG tablet Take 1 tablet (40 mg total) by mouth daily.   No facility-administered encounter medications on file as of 01/24/2024.    Allergies (verified) Patient has no known allergies.   History: Past Medical History:  Diagnosis Date   Anemia 11/16/2014   Anxiety    Aortic aneurysm (HCC)    4.1 cm 2019, ascending aorta   Arthritis    Bladder disease    "an unusual bladder disease; don't know what it's  called" (06/30/2015)   Bulging lumbar disc    Cataract    COVID-19 01/09/2021   Depression    GERD (gastroesophageal reflux disease)    History of hiatal hernia    Hyperglycemia 11/16/2014   Hyperlipemia    Hypertension    Joint pain, knee    Migraine    06/30/2015 "maybe 2-3 times/year; not as bad as I used to have them"   OSA (obstructive sleep apnea)    Sleep apnea    Past Surgical History:  Procedure Laterality Date   ESOPHAGOGASTRODUODENOSCOPY     ESOPHAGOGASTRODUODENOSCOPY N/A 11/16/2023   Procedure: EGD (ESOPHAGOGASTRODUODENOSCOPY);  Surgeon: Normie Becton., MD;  Location: [facility];  Service: Gastroenterology;  Laterality: N/A;   ESOPHAGOGASTRODUODENOSCOPY (EGD) WITH ESOPHAGEAL DILATION  ~ 2014   JOINT REPLACEMENT     right knee   KNEE ARTHROSCOPY Right 08/23/1993   NASAL SEPTUM SURGERY  ~ 1972   SAVORY DILATION N/A 11/16/2023   Procedure: EGD, WITH DILATION USING SAVARY-GILLIARD DILATOR OVER GUIDEWIRE;  Surgeon: Mansouraty, Albino Alu., MD;  Location: [facility];  Service: Gastroenterology;  Laterality: N/A;   SPINE SURGERY     TOTAL KNEE ARTHROPLASTY  10/11/2011   Procedure: TOTAL KNEE ARTHROPLASTY;  Surgeon: Florencia Hunter, MD;  Location: [facility];  Service: Orthopedics;  Laterality: Right;   TRANSFORAMINAL LUMBAR INTERBODY FUSION W/ MIS 1 LEVEL Right 03/31/2021   Procedure: Right Lumbar five Sacral one Minimally invasive transforaminal lumbar interbody fusion;  Surgeon: Cannon Champion, MD;  Location: [facility];  Service: Neurosurgery;  Laterality: Right;   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)  ~2014   UPPER ESOPHAGEAL ENDOSCOPIC ULTRASOUND (EUS) N/A 11/16/2023   Procedure: UPPER ESOPHAGEAL ENDOSCOPIC ULTRASOUND (EUS);  Surgeon: Normie Becton., MD;  Location: [facility];  Service: Gastroenterology;  Laterality: N/A;   Family History  Problem Relation Age of Onset   Stroke Mother 28       Died of CVA   Hypertension  Mother    Anesthesia problems Neg Hx    Hypotension Neg Hx    Malignant hyperthermia Neg Hx    Pseudochol deficiency Neg Hx    Esophageal cancer Neg Hx    Colon cancer Neg Hx    Stomach cancer Neg Hx    Inflammatory bowel disease Neg Hx    Liver disease Neg Hx    Pancreatic cancer Neg Hx    Rectal cancer Neg Hx    Social History   Socioeconomic History   Marital status: Married    Spouse name: Not on file   Number of children: 3   Years of education: Not on file   Highest education level: GED or equivalent  Occupational History   Not on file  Tobacco Use   Smoking status: Former    Current packs/day: 0.00    Average packs/day: 1.5 packs/day for 6.0 years (9.0 ttl pk-yrs)  Types: Cigarettes    Start date: 12/01/1970    Quit date: 11/30/1976    Years since quitting: 47.1   Smokeless tobacco: Never   Tobacco comments:    "stopped smoking in ~ 1990"  Vaping Use   Vaping status: Never Used  Substance and Sexual Activity   Alcohol use: No    Alcohol/week: 0.0 standard drinks of alcohol   Drug use: No   Sexual activity: Yes  Other Topics Concern   Not on file  Social History Narrative   3 children    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   Completed 12th grade   Enjoys softball      01/24/24 Fully retired now   Social Drivers of [employer] Strain: Low Risk  (01/24/2024)   Overall Financial Resource Strain (CARDIA)    Difficulty of Paying Living Expenses: Not very hard  Food Insecurity: No Food Insecurity (01/24/2024)   Hunger Vital Sign    Worried About Running Out of Food in the Last Year: Never true    Ran Out of Food in the Last Year: Never true  Transportation Needs: No Transportation Needs (01/24/2024)   PRAPARE - Administrator, Civil Service (Medical): No    Lack of Transportation (Non-Medical): No  Physical Activity: Insufficiently Active (01/24/2024)   Exercise Vital Sign    Days of Exercise per Week: 3  days    Minutes of Exercise per Session: 30 min  Stress: No Stress Concern Present (01/24/2024)   [employer] of Occupational Health - Occupational Stress Questionnaire    Feeling of Stress : Not at all  Social Connections: Socially Integrated (01/24/2024)   Social Connection and Isolation Panel [NHANES]    Frequency of Communication with Friends and Family: Once a week    Frequency of Social Gatherings with Friends and Family: Twice a week    Attends Religious Services: More than 4 times per year    Active Member of [employer] or Organizations: Yes    Attends Engineer, structural: More than 4 times per year    Marital Status: Married    Tobacco Counseling Counseling given: Not Answered Tobacco comments: "stopped smoking in ~ 1990"    Clinical Intake:  Pre-visit preparation completed: Yes  Pain : No/denies pain     BMI - recorded: 44.06 Nutritional Status: BMI > 30  Obese Diabetes: No  Lab Results  Component Value Date   HGBA1C 6.2 11/30/2023   HGBA1C 5.2 01/24/2023   HGBA1C 5.7 07/26/2022     How often do you need to have someone help you when you read instructions, pamphlets, or other written materials from your doctor or pharmacy?: 1 - Never What is the last grade level you completed in school?: 12, minister classes, fire department classes  Interpreter Needed?: No  Information entered by :: Susa Engman, CMA   Activities of Daily Living     01/24/2024    2:35 PM  In your present state of health, do you have any difficulty performing the following activities:  Hearing? 0  Vision? 0  Comment wears contacts, no difficulty seeing. last eye exam 2025, [place]  Difficulty concentrating or making decisions? 0  Walking or climbing stairs? 0  Comment hx of knee replacement  Dressing or bathing? 0  Doing errands, shopping? 0  Preparing Food and eating ? N  Using the Toilet? N  In the past six months, have you accidently leaked  urine? Y  Comment a couple  times. has appt with Dr Doyne Genin in July  Do you have problems with loss of bowel control? Y  Comment once.  Managing your Medications? N  Managing your Finances? N  Housekeeping or managing your Housekeeping? N    Patient Care Team: Dorrene Gaucher, NP as PCP - General (Internal Medicine) Eilleen Grates, MD as PCP - Cardiology (Cardiology)  I have updated your Care Teams any recent Medical Services you may have received from other providers in the past year.     Assessment:    This is a routine wellness examination for Patricio.  Hearing/Vision screen Hearing Screening - Comments:: Denies difficulty Vision Screening - Comments:: 10/2023   Goals Addressed   None    Depression Screen     01/24/2024    2:40 PM 11/30/2023    8:36 AM 01/24/2023    8:19 AM 01/19/2023    3:05 PM 09/13/2022    9:55 AM 07/26/2022   12:17 PM 03/15/2022   10:09 AM  PHQ 2/9 Scores  PHQ - 2 Score 0 0 0 0 0 0 0  PHQ- 9 Score 2 0 0  0 0 0    Fall Risk     01/24/2024    2:45 PM 11/30/2023    8:36 AM 01/24/2023    8:19 AM 01/12/2023    2:10 PM 09/13/2022    9:56 AM  Fall Risk   Falls in the past year? 0 0 1 1 0  Number falls in past yr: 0 0 1 1 0  Injury with Fall? 0 0 0 0 0  Risk for fall due to : Impaired mobility No Fall Risks No Fall Risks No Fall Risks No Fall Risks  Follow up Education provided Falls evaluation completed Falls evaluation completed Falls evaluation completed Falls evaluation completed    MEDICARE RISK AT HOME:  Medicare Risk at Home Any stairs in or around the home?: No If so, are there any without handrails?: No Home free of loose throw rugs in walkways, pet beds, electrical cords, etc?: Yes Adequate lighting in your home to reduce risk of falls?: Yes Life alert?: No Use of a cane, walker or w/c?: No Grab bars in the bathroom?: No Shower chair or bench in shower?: Yes Elevated toilet seat or a handicapped toilet?: Yes  TIMED UP AND GO:  Was the test performed?  No, audio  visit  Cognitive Function: 6CIT completed        01/19/2023    3:10 PM 01/07/2022    2:48 PM  6CIT Screen  What Year? 0 points 0 points  What month? 0 points 0 points  What time? 0 points 0 points  Count back from 20 0 points 0 points  Months in reverse 0 points 0 points  Repeat phrase 0 points 0 points  Total Score 0 points 0 points    Immunizations Immunization History  Administered Date(s) Administered   Fluad Quad(high Dose 65+) 05/22/2021, 04/26/2022   Influenza,inj,Quad PF,6+ Mos 05/03/2016, 05/16/2017, 05/02/2018, 05/24/2019, 06/18/2020   Influenza-Unspecified 05/07/2015, 05/19/2015, 04/24/2023   Moderna Covid-19 Vaccine Bivalent Booster 38yrs & up 05/22/2021   Moderna SARS-COV2 Booster Vaccination 08/08/2020   Moderna Sars-Covid-2 Vaccination 09/24/2019, 10/22/2019   PNEUMOCOCCAL CONJUGATE-20 11/30/2023   Pfizer(Comirnaty )Fall Seasonal Vaccine 12 years and older 07/26/2022, 04/24/2023, 11/30/2023   Pneumococcal Polysaccharide-23 03/17/2020   RSV,unspecified 04/24/2023   Td 04/24/2023   Tdap 11/11/2014   Zoster Recombinant(Shingrix ) 07/19/2018, 10/20/2018    Screening Tests Health  Maintenance  Topic Date Due   Medicare Annual Wellness (AWV)  01/19/2024   COVID-19 Vaccine (7 - 2024-25 season) 01/25/2024   INFLUENZA VACCINE  03/23/2024   Colonoscopy  02/23/2032   DTaP/Tdap/Td (3 - Td or Tdap) 04/23/2033   Pneumonia Vaccine 21+ Years old  Completed   Hepatitis C Screening  Completed   Zoster Vaccines- Shingrix   Completed   HPV VACCINES  Aged Out   Meningococcal B Vaccine  Aged Out    Health Maintenance  Health Maintenance Due  Topic Date Due   Medicare Annual Wellness (AWV)  01/19/2024   COVID-19 Vaccine (7 - 2024-25 season) 01/25/2024   Health Maintenance Items Addressed: All health maintenance up to date.  Additional Screening:  Vision Screening: Recommended annual ophthalmology exams for early detection of glaucoma and other disorders of the  eye. Would you like a referral to an eye doctor? No    Dental Screening: Recommended annual dental exams for proper oral hygiene  Community Resource Referral / Chronic Care Management: CRR required this visit?  No   CCM required this visit?  No   Plan:    I have personally reviewed and noted the following in the patient's chart:   Medical and social history Use of alcohol, tobacco or illicit drugs  Current medications and supplements including opioid prescriptions. Patient is not currently taking opioid prescriptions. Functional ability and status Nutritional status Physical activity Advanced directives List of other physicians Hospitalizations, surgeries, and ER visits in previous 12 months Vitals Screenings to include cognitive, depression, and falls Referrals and appointments  In addition, I have reviewed and discussed with patient certain preventive protocols, quality metrics, and best practice recommendations. A written personalized care plan for preventive services as well as general preventive health recommendations were provided to patient.   Susa Engman, CMA   01/24/2024   After Visit Summary: (MyChart) Due to this being a telephonic visit, the after visit summary with patients personalized plan was offered to patient via MyChart   Notes: Nothing significant to report at this time.

## 2024-01-24 NOTE — Patient Instructions (Signed)
 Mr. Jeremy [name] , Thank you for taking time out of your busy schedule to complete your Annual Wellness Visit with me. I enjoyed our conversation and look forward to speaking with you again next year. I, as well as your care team,  appreciate your ongoing commitment to your health goals. Please review the following plan we discussed and let me know if I can assist you in the future. Your Game plan/ To Do List    Follow up Visits: Next Medicare AWV with our clinical staff: 01/24/25  2:20   Next Office Visit with your provider: 06/01/24  8:40am  Clinician Recommendations:  Aim for 30 minutes of exercise or brisk walking, 6-8 glasses of water, and 5 servings of fruits and vegetables each day.       This is a list of the screening recommended for you and due dates:  Health Maintenance  Topic Date Due   COVID-19 Vaccine (7 - 2024-25 season) 01/25/2024   Flu Shot  03/23/2024   Medicare Annual Wellness Visit  01/23/2025   Colon Cancer Screening  02/23/2032   DTaP/Tdap/Td vaccine (3 - Td or Tdap) 04/23/2033   Pneumonia Vaccine  Completed   Hepatitis C Screening  Completed   Zoster (Shingles) Vaccine  Completed   HPV Vaccine  Aged Out   Meningitis B Vaccine  Aged Out   You are currently up to date with your health maintenance items. I sent your refill of hydrochlorothiazide  in for you today.  Advanced directives: (Declined) Advance directive discussed with you today. Even though you declined this today, please call our office should you change your mind, and we can give you the proper paperwork for you to fill out. Advance Care Planning is important because it:  [x]  Makes sure you receive the medical care that is consistent with your values, goals, and preferences  [x]  It provides guidance to your family and loved ones and reduces their decisional burden about whether or not they are making the right decisions based on your wishes.  Follow the link provided in your after visit summary or read over  the paperwork we have mailed to you to help you started getting your Advance Directives in place. If you need assistance in completing these, please reach out to us  so that we can help you!  See attachments for Preventive Care and Fall Prevention Tips.

## 2024-01-30 ENCOUNTER — Ambulatory Visit (HOSPITAL_COMMUNITY)
Admission: RE | Admit: 2024-01-30 | Discharge: 2024-01-30 | Disposition: A | Discharge status: Home / Self Care | Source: Ambulatory Visit | Attending: Family | Admitting: Family

## 2024-01-30 DIAGNOSIS — I712 Thoracic aortic aneurysm, without rupture, unspecified: Secondary | ICD-10-CM | POA: Insufficient documentation

## 2024-01-30 DIAGNOSIS — I719 Aortic aneurysm of unspecified site, without rupture: Secondary | ICD-10-CM

## 2024-01-30 MED ORDER — IOHEXOL 350 MG/ML SOLN
75.0000 mL | Freq: Once | INTRAVENOUS | Status: AC | PRN
Start: 2024-01-30 — End: 2024-01-30
  Administered 2024-01-30: 75 mL via INTRAVENOUS

## 2024-01-31 ENCOUNTER — Telehealth: Payer: Self-pay | Admitting: Neurology

## 2024-01-31 DIAGNOSIS — R911 Solitary pulmonary nodule: Secondary | ICD-10-CM | POA: Insufficient documentation

## 2024-01-31 DIAGNOSIS — I7121 Aneurysm of the ascending aorta, without rupture: Secondary | ICD-10-CM

## 2024-01-31 NOTE — Telephone Encounter (Signed)
 There was a nodule seen in his lung which is new since last check. The radiologist recommends that we repeat a CT in 3 months to make sure it does not change in size.  In regards to his aortic aneurysm, it is very slightly larger- 4.3 cm compared to 4.0 cm. I would recommend that he repeat his CT to recheck his aneurysm in 1 year and also meet with a thoracic surgeon for consultation so that they can help us  keep an eye on this.

## 2024-01-31 NOTE — Addendum Note (Signed)
 Addended by: Dorrene Gaucher on: 01/31/2024 01:08 PM   Modules accepted: Orders

## 2024-01-31 NOTE — Telephone Encounter (Signed)
 Copied from CRM [id number]. Topic: Clinical - Lab/Test Results >> Jan 31, 2024  8:33 AM [place] wrote: Reason for CRM: Patient wanting a nurse to contact him to go over the CT scan results that he received yesterday.

## 2024-01-31 NOTE — Telephone Encounter (Signed)
 Patient notified of this information. He verbalized understanding. He received call from thoracic surgery and is scheduled to see them.

## 2024-02-07 ENCOUNTER — Other Ambulatory Visit: Payer: Self-pay | Admitting: Cardiology

## 2024-02-07 DIAGNOSIS — R931 Abnormal findings on diagnostic imaging of heart and coronary circulation: Secondary | ICD-10-CM

## 2024-02-14 ENCOUNTER — Ambulatory Visit: Admitting: Family

## 2024-02-15 DIAGNOSIS — M48062 Spinal stenosis, lumbar region with neurogenic claudication: Secondary | ICD-10-CM | POA: Diagnosis not present

## 2024-02-15 DIAGNOSIS — M4326 Fusion of spine, lumbar region: Secondary | ICD-10-CM | POA: Diagnosis not present

## 2024-02-15 DIAGNOSIS — M5416 Radiculopathy, lumbar region: Secondary | ICD-10-CM | POA: Diagnosis not present

## 2024-02-15 DIAGNOSIS — Z1330 Encounter for screening examination for mental health and behavioral disorders, unspecified: Secondary | ICD-10-CM | POA: Diagnosis not present

## 2024-02-17 DIAGNOSIS — M4326 Fusion of spine, lumbar region: Secondary | ICD-10-CM | POA: Diagnosis not present

## 2024-02-17 DIAGNOSIS — M47816 Spondylosis without myelopathy or radiculopathy, lumbar region: Secondary | ICD-10-CM | POA: Diagnosis not present

## 2024-02-21 ENCOUNTER — Other Ambulatory Visit: Payer: Self-pay | Admitting: Family

## 2024-02-23 DIAGNOSIS — N401 Enlarged prostate with lower urinary tract symptoms: Secondary | ICD-10-CM | POA: Diagnosis not present

## 2024-02-23 DIAGNOSIS — R319 Hematuria, unspecified: Secondary | ICD-10-CM | POA: Diagnosis not present

## 2024-02-23 DIAGNOSIS — N138 Other obstructive and reflux uropathy: Secondary | ICD-10-CM | POA: Diagnosis not present

## 2024-02-27 NOTE — Progress Notes (Unsigned)
 PCP is Daryl Setter, NP Referring Provider is Daryl Setter, NP  Reason for Consult: Evaluation and surveillance of thoracic aortic aneurysm   HPI: Mr. Jeremy [name] is a 68 year old male with past history notable for hypertension, dyslipidemia, hyperglycemia, class III obesity, obstructive sleep apnea, generalized anxiety disorder, and who is a former 9-pack-year tobacco smoker.  He has a known thoracic aortic aneurysm that has been followed by his primary care provider for several years.  On a chest CTA done in 2019 it was measuring 4.1 cm.  He has been followed annually with chest CT angiography by his primary care provider since then.  He was referred to us  for ongoing surveillance. Mr. Seelbach reports having occasional mid sternal chest pain unrelated to activity or exertion. He has no known family history of aneurysmal disease.   Past Medical History:  Diagnosis Date   Anemia 11/16/2014   Anxiety    Aortic aneurysm (HCC)    4.1 cm 2019, ascending aorta   Arthritis    Bladder disease    an unusual bladder disease; don't know what it's called (06/30/2015)   Bulging lumbar disc    Cataract    COVID-19 01/09/2021   Depression    GERD (gastroesophageal reflux disease)    History of hiatal hernia    Hyperglycemia 11/16/2014   Hyperlipemia    Hypertension    Joint pain, knee    Migraine    06/30/2015 maybe 2-3 times/year; not as bad as I used to have them   OSA (obstructive sleep apnea)    Sleep apnea     Past Surgical History:  Procedure Laterality Date   ESOPHAGOGASTRODUODENOSCOPY     ESOPHAGOGASTRODUODENOSCOPY N/A 11/16/2023   Procedure: EGD (ESOPHAGOGASTRODUODENOSCOPY);  Surgeon: Wilhelmenia Aloha Raddle., MD;  Location: [facility];  Service: Gastroenterology;  Laterality: N/A;   ESOPHAGOGASTRODUODENOSCOPY (EGD) WITH ESOPHAGEAL DILATION  ~ 2014   JOINT REPLACEMENT     right knee   KNEE ARTHROSCOPY Right 08/23/1993   NASAL SEPTUM SURGERY  ~ 1972   SAVORY  DILATION N/A 11/16/2023   Procedure: EGD, WITH DILATION USING SAVARY-GILLIARD DILATOR OVER GUIDEWIRE;  Surgeon: Mansouraty, Aloha Raddle., MD;  Location: [facility];  Service: Gastroenterology;  Laterality: N/A;   SPINE SURGERY     TOTAL KNEE ARTHROPLASTY  10/11/2011   Procedure: TOTAL KNEE ARTHROPLASTY;  Surgeon: Garnette JONETTA Raman, MD;  Location: [facility];  Service: Orthopedics;  Laterality: Right;   TRANSFORAMINAL LUMBAR INTERBODY FUSION W/ MIS 1 LEVEL Right 03/31/2021   Procedure: Right Lumbar five Sacral one Minimally invasive transforaminal lumbar interbody fusion;  Surgeon: Cheryle Debby LABOR, MD;  Location: [facility];  Service: Neurosurgery;  Laterality: Right;   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)  ~2014   UPPER ESOPHAGEAL ENDOSCOPIC ULTRASOUND (EUS) N/A 11/16/2023   Procedure: UPPER ESOPHAGEAL ENDOSCOPIC ULTRASOUND (EUS);  Surgeon: Wilhelmenia Aloha Raddle., MD;  Location: [facility];  Service: Gastroenterology;  Laterality: N/A;    Family History  Problem Relation Age of Onset   Stroke Mother 54       Died of CVA   Hypertension Mother    Anesthesia problems Neg Hx    Hypotension Neg Hx    Malignant hyperthermia Neg Hx    Pseudochol deficiency Neg Hx    Esophageal cancer Neg Hx    Colon cancer Neg Hx    Stomach cancer Neg Hx    Inflammatory bowel disease Neg Hx    Liver disease Neg Hx    Pancreatic cancer Neg Hx  Rectal cancer Neg Hx     Social History Social History   Tobacco Use   Smoking status: Former    Current packs/day: 0.00    Average packs/day: 1.5 packs/day for 6.0 years (9.0 ttl pk-yrs)    Types: Cigarettes    Start date: 12/01/1970    Quit date: 11/30/1976    Years since quitting: 47.2   Smokeless tobacco: Never   Tobacco comments:    stopped smoking in ~ 1990  Vaping Use   Vaping status: Never Used  Substance Use Topics   Alcohol use: No    Alcohol/week: 0.0 standard drinks of alcohol   Drug use: No    Current Outpatient  Medications  Medication Sig Dispense Refill   amLODipine -benazepril  (LOTREL) 10-40 MG capsule Take 1 capsule by mouth once daily 90 capsule 0   ARIPiprazole  (ABILIFY ) 5 MG tablet Take 1 tablet (5 mg total) by mouth daily. 90 tablet 1   ascorbic acid (VITAMIN C) 500 MG tablet Take 500 mg by mouth daily.     aspirin  EC 81 MG tablet Take 1 tablet (81 mg total) by mouth daily. Swallow whole. 90 tablet 3   b complex vitamins tablet Take 1 tablet by mouth daily.     ezetimibe  (ZETIA ) 10 MG tablet Take 1 tablet (10 mg total) by mouth daily. 90 tablet 3   ferrous sulfate  325 (65 FE) MG tablet Take 1 tablet (325 mg total) by mouth 2 (two) times daily with a meal. 60 tablet 11   fexofenadine  (ALLEGRA ) 180 MG tablet Take 1 tablet (180 mg total) by mouth daily. 30 tablet 0   furosemide  (LASIX ) 20 MG tablet Take 1 tablet (20 mg total) by mouth daily. 90 tablet 1   hydrochlorothiazide  (HYDRODIURIL ) 25 MG tablet Take 1 tablet (25 mg total) by mouth daily. 90 tablet 1   metoprolol  succinate (TOPROL -XL) 50 MG 24 hr tablet Take 1 tablet (50 mg total) by mouth daily. Take with or immediately following a meal 90 tablet 0   Multiple Vitamin (MULTIVITAMIN WITH MINERALS) TABS tablet Take 1 tablet by mouth daily.     omeprazole  (PRILOSEC) 40 MG capsule Take 1 capsule (40 mg total) by mouth daily. 30 capsule 12   potassium chloride  (KLOR-CON ) 10 MEQ tablet Take 2 tablets (20 mEq total) by mouth in the morning AND 1 tablet (10 mEq total) every evening. 270 tablet 0   rosuvastatin  (CRESTOR ) 40 MG tablet Take 1 tablet by mouth once daily 30 tablet 0   sertraline  (ZOLOFT ) 100 MG tablet Take 1.5 tablets (150 mg total) by mouth daily. 135 tablet 1   No current facility-administered medications for this visit.    No Known Allergies  Review of Systems: Review of Systems  Constitutional:        20 lb Wt gain in past 3 months  HENT:  Positive for hearing loss.   Eyes:        Wears glasses  Respiratory:  Positive for  cough and shortness of breath.        History of sleep apnea  Gastrointestinal:        Reflux  Genitourinary:  Positive for frequency and hematuria.       Currently being evaluated by a urologist  Neurological:  Positive for headaches.  Psychiatric/Behavioral:  Positive for depression.        History of anxiety     There were no vitals taken for this visit. Physical Exam: Vital signs BP 145/82 Heart rate 62  Respirations 20 SpO2 94% on room air  General: Anxious appearing 68 year old male in no distress.  Skin is warm and dry. Neck: No JVD no carotid bruit Heart: Regular rate and rhythm, no murmur. Chest: Breath sounds are full, equal, clear to auscultation. Extremities: All well-perfused, equal pulses in upper extremities. Neuro: Grossly intact.  Diagnostic Tests: EXAMINATION: CT ANGIO CHEST AORTA W/ & OR WO/CM & GATING ([facility] ONLY)   CLINICAL INDICATION: Male, 69 years old. thoracic aortic aneurysm follow up   TECHNIQUE: Axial CTA of the chest with 100 cc Omnipaque  300 intravenous contrast. Multiplanar and 3D reformations provided. Unless otherwise specified, incidental thyroid , adrenal, renal lesions do not require dedicated imaging follow up. Additionally, any mentioned pulmonary nodules do not require dedicated imaging follow-up based on the Fleischner guidelines unless otherwise specified. Coronary calcifications are not identified unless otherwise specified.   COMPARISON: 05/31/2022   FINDINGS:   The ascending thoracic aorta is dilated measuring 4.3 cm, which is unchanged when measured in similar fashion on prior exam.   The aortic arch and descending thoracic aorta are normal in caliber. Mild atherosclerotic changes are present. The origins of the great vessels appear patent.   The visualized thyroid  is normal. The main pulmonary artery is normal in caliber. The heart is normal in size. There is no free fluid or pathologic lymphadenopathy by size criteria. The  visualized upper abdominal structures appear within normal limits. The trachea and mainstem bronchi appear patent. There is been interval development of a new 11 mm left lower lobe pulmonary nodular opacity (series 304 image 265).   There are mild degenerative changes of the spine.   IMPRESSION:   Interval development of an 11 mm left lower lobe pulmonary nodular opacity. Short-term follow-up chest CT in 3 months is recommended based on the Fleischner guidelines.   Similar dilatation of the ascending thoracic aorta measuring 4.3 cm.   DOSE REDUCTION: This exam was performed according to our departmental dose-optimization program which includes automated exposure control, adjustment of the mA and/or kV according to patient size and/or use of iterative reconstruction technique.   Electronically signed by: [place] Engel MD 01/30/2024 06:21 PM EDT RP Workstation: [id number]  Impression / Plan: Relatively stable 4.3 cm thoracic aortic aneurysm in a 68 year old male with past history of dyslipidemia, hypertension, former tobacco user having quit in 1978, and obstructive sleep apnea.  An echocardiogram obtained about 1 year ago shows a normal tricuspid aortic valve with no stenosis or insufficiency.  We discussed recommendations to minimize the risk of further expansion or dissection including careful blood pressure control, avoidance of repetitive strenuous activities, attention to lipid management, and avoidance of quinolone antibiotics.  On the most recent chest CT dated 01/30/2024, he was also noted to have developed an 11 mm left lower lobe pulmonary nodule that is being followed with by his primary care provider. Follow-up in 1 year with CT chest.  Okay to use a noncontrast CT if he has had a recent study for surveillance of the pulmonary nodule.   Emary Zalar G. Peighton Edgin, PA-C [facility] [phone]

## 2024-02-27 NOTE — Patient Instructions (Incomplete)
 Risk Modification in those with ascending thoracic aortic aneurysm:  Continue good control of blood pressure (prefer SBP 130/80 or less)  2. Avoid fluoroquinolone antibiotics (I.e Ciprofloxacin , Avelox, Levofloxacin, Ofloxacin)  3.  Use of statin (to decrease cardiovascular risk)  4.  Exercise and activity limitations is individualized, but in general, contact sports are to be avoided and one should avoid heavy lifting (defined as half of ideal body weight) and exercises involving sustained Valsalva maneuver.  5.  Follow-up in 1 year with CTA chest

## 2024-02-28 ENCOUNTER — Ambulatory Visit: Attending: Surgery | Admitting: Physician Assistant

## 2024-02-28 VITALS — BP 145/82 | HR 62 | Resp 20 | Ht 66.0 in | Wt 270.0 lb

## 2024-02-28 DIAGNOSIS — I7121 Aneurysm of the ascending aorta, without rupture: Secondary | ICD-10-CM

## 2024-03-03 ENCOUNTER — Other Ambulatory Visit: Payer: Self-pay | Admitting: Cardiology

## 2024-03-03 DIAGNOSIS — R931 Abnormal findings on diagnostic imaging of heart and coronary circulation: Secondary | ICD-10-CM

## 2024-03-07 ENCOUNTER — Telehealth: Payer: Self-pay | Admitting: Cardiology

## 2024-03-07 DIAGNOSIS — R931 Abnormal findings on diagnostic imaging of heart and coronary circulation: Secondary | ICD-10-CM

## 2024-03-07 MED ORDER — ROSUVASTATIN CALCIUM 40 MG PO TABS: 40.0000 mg | ORAL_TABLET | Freq: Every day | ORAL | 1 refills | Status: DC

## 2024-03-07 NOTE — Telephone Encounter (Signed)
 RX sent to requested Pharmacy

## 2024-03-07 NOTE — Telephone Encounter (Signed)
*  STAT* If patient is at the pharmacy, call can be transferred to refill team.   1. Which medications need to be refilled? (please list name of each medication and dose if known) rosuvastatin  (CRESTOR ) 40 MG tablet    2. Would you like to learn more about the convenience, safety, & potential cost savings by using the [facility]?     3. Are you open to using the [facility] (Type [facility].  ).   4. Which pharmacy/location (including street and city if local pharmacy) is medication to be sent to? [facility] - [place], [place] - [address]    5. Do they need a 30 day or 90 day supply? 90 day

## 2024-03-09 ENCOUNTER — Other Ambulatory Visit: Payer: Self-pay | Admitting: Family

## 2024-03-09 DIAGNOSIS — R319 Hematuria, unspecified: Secondary | ICD-10-CM | POA: Diagnosis not present

## 2024-03-09 NOTE — Telephone Encounter (Signed)
 Copied from CRM [id number]. Topic: Clinical - Medication Refill >> Mar 09, 2024 10:12 AM Mia F wrote: Medication: furosemide  (LASIX ) 20 MG tablet   Has the patient contacted their pharmacy? No (Agent: If no, request that the patient contact the pharmacy for the refill. If patient does not wish to contact the pharmacy document the reason why and proceed with request.) (Agent: If yes, when and what did the pharmacy advise?)  This is the patient's preferred pharmacy:  [id number] - [place], [place] - [address] [place] [address] Phone: [phone] Fax: [phone]  Is this the correct pharmacy for this prescription? Yes If no, delete pharmacy and type the correct one.   Has the prescription been filled recently? No  Is the patient out of the medication? No  Has the patient been seen for an appointment in the last year OR does the patient have an upcoming appointment? Yes  Can we respond through MyChart? Yes  Agent: Please be advised that Rx refills may take up to 3 business days. We ask that you follow-up with your pharmacy.

## 2024-03-13 DIAGNOSIS — M48062 Spinal stenosis, lumbar region with neurogenic claudication: Secondary | ICD-10-CM | POA: Diagnosis not present

## 2024-03-13 DIAGNOSIS — M47816 Spondylosis without myelopathy or radiculopathy, lumbar region: Secondary | ICD-10-CM | POA: Diagnosis not present

## 2024-03-14 ENCOUNTER — Other Ambulatory Visit: Payer: Self-pay | Admitting: Family

## 2024-03-14 NOTE — Telephone Encounter (Signed)
 Copied from CRM #[id number]. Topic: Clinical - Medication Refill >> Mar 14, 2024  2:03 PM Leah C wrote: Medication: furosemide  (LASIX ) 20 MG tablet  Has the patient contacted their pharmacy? Yes and they did not receive the request.  (Agent: If no, request that the patient contact the pharmacy for the refill. If patient does not wish to contact the pharmacy document the reason why and proceed with request.) (Agent: If yes, when and what did the pharmacy advise?)  This is the patient's preferred pharmacy:  [id number] - [place], [place] - [address] [place] [address] Phone: [phone] Fax: [phone]   Is this the correct pharmacy for this prescription? Yes If no, delete pharmacy and type the correct one.   Has the prescription been filled recently? Yes  Is the patient out of the medication? Patient stated he has seven left.   Has the patient been seen for an appointment in the last year OR does the patient have an upcoming appointment? Yes  Can we respond through MyChart? Yes  Agent: Please be advised that Rx refills may take up to 3 business days. We ask that you follow-up with your pharmacy.

## 2024-03-16 ENCOUNTER — Telehealth: Payer: Self-pay

## 2024-03-16 DIAGNOSIS — R319 Hematuria, unspecified: Secondary | ICD-10-CM | POA: Diagnosis not present

## 2024-03-16 DIAGNOSIS — K649 Unspecified hemorrhoids: Secondary | ICD-10-CM | POA: Diagnosis not present

## 2024-03-16 DIAGNOSIS — R160 Hepatomegaly, not elsewhere classified: Secondary | ICD-10-CM | POA: Diagnosis not present

## 2024-03-16 DIAGNOSIS — K7689 Other specified diseases of liver: Secondary | ICD-10-CM | POA: Diagnosis not present

## 2024-03-16 DIAGNOSIS — R310 Gross hematuria: Secondary | ICD-10-CM | POA: Diagnosis not present

## 2024-03-16 DIAGNOSIS — K5730 Diverticulosis of large intestine without perforation or abscess without bleeding: Secondary | ICD-10-CM | POA: Diagnosis not present

## 2024-03-16 NOTE — Telephone Encounter (Signed)
 Copied from CRM [id number]. Topic: General - Other >> Mar 16, 2024  3:36 PM Thersia BROCKS wrote: Reason for CRM: Patient called in stated he needs to speak with Levorn , would like call back

## 2024-03-19 NOTE — Telephone Encounter (Signed)
 Spoke to patient on Friday, wanted information about radiology findings.

## 2024-03-21 ENCOUNTER — Other Ambulatory Visit: Payer: Self-pay | Admitting: Cardiology

## 2024-03-21 DIAGNOSIS — E785 Hyperlipidemia, unspecified: Secondary | ICD-10-CM

## 2024-03-21 NOTE — Progress Notes (Unsigned)
 HPI M retired former smoker(9 pkyrs quit 1990) followed for OSA Medical problem list includes OSA,  Allergic Rhinitis, Aortic Aneurysm, GERD, HTN, Hyperlipidemia, Lumbar Radiculopathy, Morbid Obesity, Umbilical Hernia, Depression,  NPSG/Split 11/28/17- AHI 44.7/ hr, desaturation to 71%. Titration to BIPAP 23/19 with treatment-emergent centrals., body weight 255 lbs PFT 10/11/23- Mild Obstructive Airways Disease Insignificant response to bronchodilator Airtrapping without overinflation Normal Difusion ====================================================================================================   10/10/23- 67 yoM retired former smoker(9 pkyrs quit 1990) followed for OSA Medical problem list includes OSA,  Allergic Rhinitis, Aortic Aneurysm, GERD, HTN, Hyperlipidemia, Lumbar Radiculopathy, Morbid Obesity, Umbilical Hernia, Depression,  NPSG/Split 11/28/17- AHI 44.7/ hr, desaturation to 71%. Titration to BIPAP 23/19 with treatment-emergent centrals., body weight 255 lbs CPAP 12-17/ [facility]     wirh treatment emergent centrals      ResMed AirSense 10             pressure range 12.5-13.7   Significant leak Download compliance 60%, AHI 10.9/hr   many short nights Body weight today  -----Doing well.  Was told he would get a new CPAP at last OV in July 2024.  Never got call to get unit. Wasn't eligible then. Mask leaks. Download reviewed. Uses every night, but many short nights.  We will replace machine now and refit mask, changing auto range to 12-20 Says Dr [facility] is working on [employer]. Denies cough or wheeze. Agrees to PFT. CXR 06/15/23 IMPRESSION: Low lung volumes without evidence of acute cardiopulmonary disease  03/22/24- 68 yoM retired former smoker(9 pkyrs quit 1990) followed for OSA, Lung nodule, Medical problem list includes OSA,  Allergic Rhinitis, Aortic Aneurysm, GERD, HTN, Hyperlipidemia, Lumbar Radiculopathy, Morbid Obesity, Umbilical Hernia, Depression,  CPAP 12-20/  [facility]     wirh treatment emergent centrals      ResMed AirSense 10               Download compliance -100%, AHI 18.3/hr     Centrals 7.6> obst 2.8    Pressure range 13.7-16.8   leak 15.9 Body weight today -273 lbs PFT 10/11/23- Mild Obstructive Airways Disease Insignificant response to bronchodilator Airtrapping without overinflation Normal Difusion Discussed lung nodule- followed by [facility]. Sleeping well, little caffeine.  CTA Aorta 01/30/24>>? Study ordered by PCP- referred to [facility] to f/u aortic aneurysm and lung nodule. IMPRESSION: Interval development of an 11 mm left lower lobe pulmonary nodular opacity. Short-term follow-up chest CT in 3 months is recommended based on the Fleischner guidelines. Similar dilatation of the ascending thoracic aorta measuring 4.3 cm. Discussed the use of AI scribe software for clinical note transcription with the patient, who gave verbal consent to proceed.  History of Present Illness   The patient presents for follow-up regarding a CT scan of the aorta and lung nodules, and management of sleep apnea. He was referred by Dr. Melissa O'Sullivan for  sleep apnea f/u. SABRA He has seen Thoracic Surgery to assess aortic aneurysm and the lung nodule, and they have ordered a 23 month f/u CT chest.  A CT scan revealed a new left upper lobe 11 mm nodule.. A follow-up scan is scheduled for September to monitor any changes in size.  He manages sleep apnea with a CPAP machine set between twelve to twenty, primarily around fifteen to sixteen. He experiences some breakthrough central apneas and occasional shortness of breath when first using the CPAP, which resolves as he adjusts. He sleeps well, feels rested during the day, and does not use sleep aids. Caffeine intake is minimal, with coffee consumed  only a couple of times a week, and he avoids energy drinks and sodas. No feelings of being smothered at night and no significant breathing issues, except for initial discomfort with  CPAP use.     Assessment and Plan:    Obstructive and central sleep apnea CPAP settings adjusted to 12-20 cm H2O, averaging 15-16 cm H2O. Central apneas present but not significant unless frequency increases. No nocturnal dyspnea, occasional shortness of breath with CPAP use. - Monitor central apneas and CPAP effectiveness. - Schedule follow-up for sleep apnea in one year. - Consider new sleep study if central apneas increase or symptoms worsen.   Lung nodule -followed by Thoracic Surgery     ROS-see HPI   + = positive Constitutional:    weight loss, night sweats, fevers, chills, fatigue, lassitude. HEENT:    headaches, difficulty swallowing, tooth/dental problems, sore throat,       sneezing, itching, ear ache, nasal congestion, post nasal drip, snoring CV:    chest pain, orthopnea, PND, +swelling in lower extremities, anasarca,                                   dizziness, palpitations Resp:   +shortness of breath with exertion or at rest.                productive cough,   non-productive cough, coughing up of blood.              change in color of mucus.  wheezing.   Skin:    rash or lesions. GI:  No-   heartburn, indigestion, abdominal pain, nausea, vomiting, diarrhea,                 change in bowel habits, loss of appetite GU: dysuria, change in color of urine, no urgency or frequency.   flank pain. MS:   joint pain, stiffness, decreased range of motion, back pain. Neuro-     nothing unusual Psych:  change in mood or affect.  +depression or +anxiety.   memory loss.  OBJ- Physical Exam General- Alert, Oriented, Affect-appropriate, Distress- none acute, +obese Skin- rash-none, lesions- none, excoriation- none Lymphadenopathy- none Head- atraumatic            Eyes- Gross vision intact, PERRLA, conjunctivae and secretions clear            Ears- Hearing, canals-normal            Nose- Clear, no-Septal dev, mucus, polyps, erosion, perforation             Throat- Mallampati III  , mucosa clear , drainage- none, tonsils- atrophic Neck- flexible , trachea midline, no stridor , thyroid  nl, carotid no bruit Chest - symmetrical excursion , unlabored           Heart/CV- RRR , no murmur , no gallop  , no rub, nl s1 s2                           - JVD- none , edema- none, stasis changes- none, varices- none           Lung- +few crackles L base, wheeze- none, cough- none , dullness-none, rub- none           Chest wall-  Abd-  Br/ Gen/ Rectal- Not done, not indicated Extrem- cyanosis- none, clubbing, none, atrophy- none, strength- nl Neuro- grossly intact  to observation

## 2024-03-22 ENCOUNTER — Ambulatory Visit: Admitting: Internal Medicine

## 2024-03-22 ENCOUNTER — Encounter: Payer: Self-pay | Admitting: Internal Medicine

## 2024-03-22 VITALS — BP 134/74 | HR 71 | Temp 98.2°F | Ht 66.0 in | Wt 273.6 lb

## 2024-03-22 DIAGNOSIS — G4733 Obstructive sleep apnea (adult) (pediatric): Secondary | ICD-10-CM

## 2024-03-22 DIAGNOSIS — R0609 Other forms of dyspnea: Secondary | ICD-10-CM

## 2024-03-22 NOTE — Patient Instructions (Signed)
 The Thoracic Surgeon has ordered a follow-up CT scan and they will be watching the lung nodule and your aortic aneurysm.  We will bring you back to follow-up on your CPAP in a year, and might consider a BIPAP ST titration then for the central apneas. For now we will keep your CPAP set 12-20.

## 2024-03-23 ENCOUNTER — Encounter: Payer: Self-pay | Admitting: Internal Medicine

## 2024-03-27 ENCOUNTER — Other Ambulatory Visit: Payer: Self-pay | Admitting: Family

## 2024-03-28 DIAGNOSIS — K649 Unspecified hemorrhoids: Secondary | ICD-10-CM | POA: Diagnosis not present

## 2024-04-03 ENCOUNTER — Other Ambulatory Visit: Payer: Self-pay | Admitting: Family

## 2024-04-10 ENCOUNTER — Ambulatory Visit (INDEPENDENT_AMBULATORY_CARE_PROVIDER_SITE_OTHER): Payer: Medicare Other | Admitting: Psychiatry

## 2024-04-10 ENCOUNTER — Encounter: Payer: Self-pay | Admitting: Psychiatry

## 2024-04-10 DIAGNOSIS — F411 Generalized anxiety disorder: Secondary | ICD-10-CM | POA: Diagnosis not present

## 2024-04-10 DIAGNOSIS — G4710 Hypersomnia, unspecified: Secondary | ICD-10-CM | POA: Diagnosis not present

## 2024-04-10 DIAGNOSIS — F4001 Agoraphobia with panic disorder: Secondary | ICD-10-CM | POA: Diagnosis not present

## 2024-04-10 DIAGNOSIS — G4730 Sleep apnea, unspecified: Secondary | ICD-10-CM

## 2024-04-10 DIAGNOSIS — R635 Abnormal weight gain: Secondary | ICD-10-CM

## 2024-04-10 DIAGNOSIS — F333 Major depressive disorder, recurrent, severe with psychotic symptoms: Secondary | ICD-10-CM | POA: Diagnosis not present

## 2024-04-10 DIAGNOSIS — T50905A Adverse effect of unspecified drugs, medicaments and biological substances, initial encounter: Secondary | ICD-10-CM

## 2024-04-10 MED ORDER — SERTRALINE HCL 100 MG PO TABS: 150.0000 mg | ORAL_TABLET | Freq: Every day | ORAL | 2 refills | Status: AC

## 2024-04-10 MED ORDER — ARIPIPRAZOLE 5 MG PO TABS: 5.0000 mg | ORAL_TABLET | Freq: Every day | ORAL | 2 refills | Status: AC

## 2024-04-10 NOTE — Progress Notes (Signed)
 Jeremy Jeremy [name] [id number] 10/25/1955 68 y.o.     Subjective:   Patient ID:  Jeremy Jeremy [name] is a 68 y.o. (DOB [birth year]) male.  Chief Complaint:  Chief Complaint  Patient presents with   Follow-up   Jeremy Jeremy [name] presents  today for follow-up of anxiety and depression.   He had been out on disability due to his back since September at that time.  seen April 2020.  Since retirement he has been under less stress and had asked to reduce and hopefully eliminate some medications.  We reduced Abilify  from 7.5 to 5 mg daily.  As of December 04, 2019 the following is noted: Still good. nOTHing worse with dose reduction.   Benefit from meds. Help depression, anxiety and anger.  They are all under control. No mood swings nor prolonged depression.  Anxiety OK.  Wants to try to reduce meds again if possible bc less stress as noted. Plan because of sexual side effects with sertraline  he prefers to reduce sertraline  to 50 mg daily and continue Abilify  5 mg daily.  02/21/2020 appointment with the following noted: Doing well still after reducing sertraline  to 50 mg daily but no improvement in sexual function.  depression and anxiety  Are still under control. No panic.   Plan:  We discussed the options of trying to eliminate gradually the Abilify  versus reducing the sertraline  which is probably more risky.  However he is having sexual side effects from sertraline  and no side effects from Abilify .  He prefers to stop the sertraline  and see if he can get by without it. Therefore reduce sertraline  to 25 mg daily for 2 weeks then stop it  05/07/20 appt with the following noted: Off sertraline  without more depression or anxiety.  No panic.  No withdrawal effects. Sexual function is not Jeremy [name].  Erection problems. Tried Viagra  without help.  Saw urologist and had shots which helped then stopped helping. He wants to try stopping Abilify  to see if sexual function will return. Plan: Since retiring his stress  level has reduced significantly.  His depression and anxiety are under control.  He wants to try to reduce medications again.   He wants to try DC Abilify  to see if sexual function is Jeremy [name] despite risk relapse. He's been fine so far off sertraline .    06/02/2020 phone call from patient:Pt called and said that he had a panic attack yesterday. He wants to know if he can go back on his medications MD response: Fairly recently discontinued sertraline  50 mg daily.  That is the best antipanic med.  Have him restart sertraline  25 mg daily for 7 days and then 50 mg daily.  Will take several weeks to help.  Hold off the Abilify  bc that was for depression. 06/13/20 TC: Pt LM on VM needing apt today to see CC. Returned call, Pt stated he is still having issues with anxiety and needs to see CC today. His follow up apt is 12/13.   2 panic attacks but close today.  06/16/2020 urgent appointment with the following noted: Seen with wife Panic and wanted to restart meds.  Wife says he's more depressed.  Only sleeps and eats and is like a walking zombie without meds. Lost father and stepmother and friend.  She thinks he's depressed.  B committed suicide years ago.   Now he thinks I'm going to leave him. Family notices his depression.  Having to help adopted father with cancer. He agrees with wife. Tense and SOB  with anxiety. Wife thinks he represses things.  Mo left him in an orphanage and has abandonment issues even with her.  Thinking she will leave. Plan: Relapse worse off meds  Increase sertraline  to 100 mg daily.  May need to go higher Increase Abilify  to 10 mg daily for severe depression with paranoid rumination. OK prn Xanax  prn panic   08/04/20 appt with following noted:  Seen with wife, Jeremy Jeremy [name] with tension.  Feels less down too.   SE increased appetite.  But had recently lost 20 # and lately snacking more. Wife sees dramatic turnaround and he's more talkative and engaged and friends notice  too.  It's working. No Xanax  used.  Staying awake during day.   Not quite back to normal but really improved per wife.   Plan: If he is back to baseline at follow-up we will consider reducing the Abilify  somewhat in order to reduce the weight gain potential if possible.  10/07/2020 appointment with following noted: Still sleeping too much and eating a lot.  Otherwise is OK.  Doesn't feel depressed.  Interest is a little Jeremy [name].  Not anxious.    Not aware of SE otherwise.  Not worrying too much.  Has retired.  Has reduced his stress significantly. Using CPAP. No panic. Plan: Continue sertraline  100 mg daily Reduce Abilify  to 5 mg daily for the history of severe paranoid rumination and depression which are improved, but apparently it's sedating him.  12/11/2020 appointment with the following noted: Reduced Abilify  to 5 mg daily without much change.   Mood been alright withuot depression.  Anxiety is not a problem. Sleep 9 hours at night and then naps in the morning after breakfast and then in afternoon.  Not much to do.  Not much he wants to do in retirement except golf and physical limitations hurt that. No other hobbies.     Weight stable.  He stopped the Abilify  couple of weeks ago bc insurance told him they would not cover it and it would be $800 dollars. Plan:  Continue sertraline  100 mg daily He stopped Abilify  5 mg 2 weeks ago DT cost Disc risk relapse.  Call if relapse bc at risk for a couple of mos.  03/24/2021 appointment with the following noted:  Seen with daughter, Jeremy Jeremy [name] Patient stopped Abilify  2 weeks prior to last appointment due to being told it would be $800.  He wanted to stay off the medication and was doing okay at the time of appointment. Taking sertraline  150 mg daily. Xanax  makes him too sleepy. Alright.  A little bit of anxiety.  Worrying about things with wife's problems with her brother's death.   Jeremy Jeremy [name] notices his anxiety is out of control.  He's constantly hovering  her and obsessing she may leave him again as in the past.  Definitely not himself and family notices not interacting with people.  She's noticed changes worsening over the year. B in law died June 04, 2025making things worse. Sleep is interrupted.  He recognizes he's more fearful than normal. Is sad also. Pending back surgery 03/31/2021 Plan: Because he relapsed again off of Abilify  we will do the following: Continue sertraline  150 mg daily Restart Abilify  10 mg daily bc did best on it.  06/08/21 appt noted: Jeremy [name] with Abilify  and SE hungry with weight gain of ? Amount. Rubs teeth front together at rest. No depression and anxiety now.  Patient reports stable mood and denies depressed or irritable moods.  Patient denies any recent difficulty  with anxiety.  Patient denies difficulty with sleep initiation or maintenance. Denies appetite disturbance.  Patient reports that energy and motivation have been good.  Patient denies any difficulty with concentration.  Patient denies any suicidal ideation. Back pain not gone but Jeremy [name] after surgery He remains on  Continue sertraline  150 mg daily and Abilify  10 Plan: Continue sertraline  150 mg daily Continue Abilify  but we will reduce to 5 mg bc hunger and rubbing teeth together may be SE More paranoid off Abilify .  09/08/2021 appointment noted: Been doing good since here after reduction to Abilify  5 mg daily.  Thinks Abilify  SE hunger and weight gain. Rubbing teeth together stopped with reduction in dose. Relapsed  Continues sertraline  150 mg daily. Plan: Continue sertraline  150 mg daily Continue Abilify  5 mg  bc hunger and rubbing teeth together may be SE More paranoid off Abilify .  12/08/2021 appointment with the following noted: Back surgery August and it still bothers him to walk.  No exercise.  No weight loss. Good with depression.  F in law died last night with complications of CA and hip fx.  He was good man.   Anxiety also been ok without fear or  paranoia. SE occ diarrhea and nothing else. Sleep is good.   Wife Jeremy with problems psych dealing with brother's death and F's cancer. Plan: Increase metoformin to 1000 mg BID and if no benefit will stop it.  No benefit at 500 mg BID Continue sertraline  150 mg daily. Continue Abilify  5 mg daily because he was paranoid off of it  03/10/2022 appointment with the following noted: Had too much nausea on the higher dose of metformin  and stopped it Wanted to try topiramate  to see if it helped with his hunger problems and started 25 mg twice daily to then increase to 50 mg twice daily With topiramate  lost a couple of # but seemed it quit working.  No SE with it. Mood been alright I guess.  Not depressed and no panic. No fear.  Sleep is good. Wants to lose mor eweight if possible. Plan: Increase topiramate  to 75 mg BID for wt control and if NR after a couple of weeks then can increase to 100 mg BID  06/10/22 appt noted: Not losing wt with topiramate  but no scales at home.   No exercise bc of back pain. Some reduction in appetite.   SE mild diarrhea but less than metformin . Current psych meds: topiramate  100 mg BID, sertraline  150 mg,  Abilify  5 mg daily Depression is pretty good.  No panic.  Anxiety level is OK. No other med problems except back and no history kidney stones. Cut out bread and potatoes. Plan: Increase topiramate  to 150 mg BID for wt control bc needs to lose wt DT back pain  10/11/22 appt noted: No difference with increase topiramate  150 mg BID,  no benefit with that med.   Current others: sertraliline and Abilify  Pretty good mood.  Not depressed.  Anxiety is OK too.  No panic.   SE non unless wt issues. Sleep is good.  Needs another back surgery.  2nd opinion.    Needs 2 surgeries.   Plan: No benefit with topiramate  so wean it over several weeks or slower if anxious.  Reduce topiramate  to 2 twice daily for 2 weeks then 1 twice daily for 2 weeks, then 1 daily for 1 week, then  stop it.  04/11/23 appt noted: Psych meds Abilify  5 mg daily, sertraline  50 mg daily No problems off the  topiramate .   Conssitent with meds.  No SE Not dep and doing well.  No panic or sig anxiety.  No fear.  No crying spells. Another back surgery in July but not Jeremy [name] yet.  I'm lazy and need and get out and walk.  Realizes it.  First back surgery did the same thing, just lazy.   Had 3 back surgeries with first a failure.  Brief anger spell quickly resolved. Plan: no changes  10/12/23 appt noted: Med as above Been ok over the last 6 mos. Not dep. No panic.  Anxiety ok.  No fear.   No SE. Only med px is SOB and being evaluated by Dr. Neysa. Back is Jeremy [name] but not there yet.  Finally started to do what doc said with walking and that has helped his back.    04/10/24 appt noted:  Med: Abilify  5 mg daily, sertraline  50 mg daily Chores is about all.  Chaplain with Medical illustrator. Patient reports stable mood and denies depressed or irritable moods.  Patient denies any recent difficulty with anxiety.  Patient denies difficulty with sleep initiation or maintenance. Denies appetite disturbance.  Patient reports that energy and motivation have been good.  Patient denies any difficulty with concentration.  Patient denies any suicidal ideation. Sometimes SOB with exertion.  Uses CPAP and followed by Dr. Neysa Struggling with weight.  Past history of psychiatric disability. Not sure how many episodes of depression he's had in life.  Historically about the same levels of depression and anxiety   Past Psychiatric Medication Trials: Sertraline  100 Abilify  10 helped SE weight gain Xanax  0.5 Metformin  N Topiramate  for wt NR  Review of Systems:  Review of Systems  Respiratory:  Positive for shortness of breath.   Cardiovascular:  Negative for chest pain and palpitations.  Genitourinary:        ED persistent  Musculoskeletal:  Positive for back pain.  Neurological:  Negative for tremors.   Psychiatric/Behavioral:  Negative for agitation, behavioral problems, confusion, decreased concentration, dysphoric mood, hallucinations, self-injury, sleep disturbance and suicidal ideas. The patient is not nervous/anxious and is not hyperactive.     Medications: I have reviewed the patient's current medications.  Current Outpatient Medications  Medication Sig Dispense Refill   amLODipine -benazepril  (LOTREL) 10-40 MG capsule Take 1 capsule by mouth once daily 90 capsule 0   ascorbic acid (VITAMIN C) 500 MG tablet Take 500 mg by mouth daily.     aspirin  EC 81 MG tablet Take 1 tablet (81 mg total) by mouth daily. Swallow whole. 90 tablet 3   b complex vitamins tablet Take 1 tablet by mouth daily.     ezetimibe  (ZETIA ) 10 MG tablet Take 1 tablet by mouth once daily 90 tablet 0   ferrous sulfate  325 (65 FE) MG tablet Take 1 tablet (325 mg total) by mouth 2 (two) times daily with a meal. 60 tablet 11   fexofenadine  (ALLEGRA ) 180 MG tablet Take 1 tablet (180 mg total) by mouth daily. 30 tablet 0   furosemide  (LASIX ) 20 MG tablet Take 1 tablet (20 mg total) by mouth daily. 90 tablet 1   hydrochlorothiazide  (HYDRODIURIL ) 25 MG tablet Take 1 tablet (25 mg total) by mouth daily. 90 tablet 1   metoprolol  succinate (TOPROL -XL) 50 MG 24 hr tablet TAKE 1 TABLET BY MOUTH ONCE DAILY WITH MEALS 90 tablet 0   Multiple Vitamin (MULTIVITAMIN WITH MINERALS) TABS tablet Take 1 tablet by mouth daily.     omeprazole  (PRILOSEC) 40 MG capsule Take  1 capsule (40 mg total) by mouth daily. 30 capsule 12   potassium chloride  (KLOR-CON ) 10 MEQ tablet Take 2 tablets (20 mEq total) by mouth in the morning AND 1 tablet (10 mEq total) every evening. 270 tablet 0   rosuvastatin  (CRESTOR ) 40 MG tablet Take 1 tablet (40 mg total) by mouth daily. 90 tablet 1   ARIPiprazole  (ABILIFY ) 5 MG tablet Take 1 tablet (5 mg total) by mouth daily. 90 tablet 2   sertraline  (ZOLOFT ) 100 MG tablet Take 1.5 tablets (150 mg total) by mouth  daily. 135 tablet 2   No current facility-administered medications for this visit.    Medication Side Effects: ED with Zoloft .  Viagra  failed.  Using shots.  Allergies:  Allergies  Allergen Reactions   Quinolones Other (See Comments)    Contraindicated due to presence of thoracic aortic aneurysm.    Past Medical History:  Diagnosis Date   Anemia 11/16/2014   Anxiety    Aortic aneurysm (HCC)    4.1 cm 2019, ascending aorta   Arthritis    Bladder disease    an unusual bladder disease; don't know what it's called (06/30/2015)   Bulging lumbar disc    Cataract    COVID-19 01/09/2021   Depression    GERD (gastroesophageal reflux disease)    History of hiatal hernia    Hyperglycemia 11/16/2014   Hyperlipemia    Hypertension Can't   Remember   Joint pain, knee    Migraine    06/30/2015 maybe 2-3 times/year; not as bad as I used to have them   OSA (obstructive sleep apnea)    Sleep apnea     Family History  Problem Relation Age of Onset   Stroke Mother 46       Died of CVA   Hypertension Mother    Anesthesia problems Neg Hx    Hypotension Neg Hx    Malignant hyperthermia Neg Hx    Pseudochol deficiency Neg Hx    Esophageal cancer Neg Hx    Colon cancer Neg Hx    Stomach cancer Neg Hx    Inflammatory bowel disease Neg Hx    Liver disease Neg Hx    Pancreatic cancer Neg Hx    Rectal cancer Neg Hx     Social History   Socioeconomic History   Marital status: Married    Spouse name: Not on file   Number of children: 3   Years of education: Not on file   Highest education level: GED or equivalent  Occupational History   Not on file  Tobacco Use   Smoking status: Former    Current packs/day: 0.00    Average packs/day: 1.5 packs/day for 6.0 years (9.0 ttl pk-yrs)    Types: Cigarettes    Start date: 12/01/1970    Quit date: 11/30/1976    Years since quitting: 47.3   Smokeless tobacco: Never   Tobacco comments:    stopped smoking in ~ 1990  Vaping Use    Vaping status: Never Used  Substance and Sexual Activity   Alcohol use: No    Alcohol/week: 0.0 standard drinks of alcohol   Drug use: No   Sexual activity: Yes  Other Topics Concern   Not on file  Social History Narrative   3 children    Naval architect   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)   Married   Completed 12th grade   Enjoys softball      01/24/24 Fully retired now  Social Drivers of Corporate investment banker Strain: Low Risk  (02/15/2024)   Received from [facility]   Overall Financial Resource Strain (CARDIA)    Difficulty of Paying Living Expenses: Not hard at all  Food Insecurity: No Food Insecurity (02/15/2024)   [facility]   Hunger Vital Sign    Within the past 12 months, you worried that your food would run out before you got the money to buy more.: Never true    Within the past 12 months, the food you bought just didn't last and you didn't have money to get more.: Never true  Transportation Needs: No Transportation Needs (02/15/2024)   Received from [facility] - Transportation    Lack of Transportation (Medical): No    Lack of Transportation (Non-Medical): No  Physical Activity: Insufficiently Active (01/24/2024)   Exercise Vital Sign    Days of Exercise per Week: 3 days    Minutes of Exercise per Session: 30 min  Stress: No Stress Concern Present (01/24/2024)   [employer] of Occupational Health - Occupational Stress Questionnaire    Feeling of Stress : Not at all  Social Connections: Socially Integrated (01/24/2024)   Social Connection and Isolation Panel    Frequency of Communication with Friends and Family: Once a week    Frequency of Social Gatherings with Friends and Family: Twice a week    Attends Religious Services: More than 4 times per year    Active Member of [employer] or Organizations: Yes    Attends Banker Meetings: More than 4 times per year    Marital Status: Married  Catering manager Violence: Not At  Risk (01/24/2024)   Humiliation, Afraid, Rape, and Kick questionnaire    Fear of Current or Ex-Partner: No    Emotionally Abused: No    Physically Abused: No    Sexually Abused: No    Past Medical History, Surgical history, Social history, and Family history were reviewed and updated as appropriate.   Please see review of systems for further details on the patient's review from today.   Objective:   Physical Exam:  There were no vitals taken for this visit.  Physical Exam Constitutional:      General: He is not in acute distress.    Appearance: He is well-developed.  Musculoskeletal:        General: No deformity.  Neurological:     Mental Status: He is alert and oriented to person, place, and time.     Motor: No tremor.     Coordination: Coordination normal.     Gait: Gait normal.  Psychiatric:        Attention and Perception: Attention and perception normal. He does not perceive auditory hallucinations.        Mood and Affect: Mood is not anxious or depressed.        Speech: Speech normal. Speech is not slurred.        Behavior: Behavior is not slowed.        Thought Content: Thought content is not paranoid or delusional. Thought content does not include homicidal or suicidal ideation. Thought content does not include suicidal plan.        Cognition and Memory: Cognition normal.     Comments: Insight and judgment fair. No auditory or visual hallucinations. No AIM     Lab Review:     Component Value Date/Time   NA 142 11/30/2023 0905   K 4.0 11/30/2023 0905  CL 101 11/30/2023 0905   CO2 32 11/30/2023 0905   GLUCOSE 94 11/30/2023 0905   BUN 16 11/30/2023 0905   CREATININE 0.99 11/30/2023 0905   CREATININE 1.05 01/13/2018 1542   CALCIUM  9.1 11/30/2023 0905   PROT 6.5 11/30/2023 0905   PROT 6.2 07/05/2023 1107   ALBUMIN  4.4 11/30/2023 0905   ALBUMIN  4.0 07/05/2023 1107   AST 20 11/30/2023 0905   ALT 27 11/30/2023 0905   ALKPHOS 97 11/30/2023 0905   BILITOT 0.5  11/30/2023 0905   BILITOT 0.2 07/05/2023 1107   GFRNONAA >60 03/26/2021 1259   GFRAA >60 10/25/2017 0438       Component Value Date/Time   WBC 7.9 08/31/2023 0925   RBC 4.88 08/31/2023 0925   HGB 13.8 08/31/2023 0925   HCT 41.1 08/31/2023 0925   PLT 279.0 08/31/2023 0925   MCV 84.2 08/31/2023 0925   MCH 31.6 03/26/2021 1259   MCHC 33.6 08/31/2023 0925   RDW 15.7 (H) 08/31/2023 0925   LYMPHSABS 1.7 01/24/2023 0905   MONOABS 0.7 01/24/2023 0905   EOSABS 0.2 01/24/2023 0905   BASOSABS 0.0 01/24/2023 0905    No results found for: POCLITH, LITHIUM   No results found for: PHENYTOIN, PHENOBARB, VALPROATE, CBMZ   .res Assessment: Plan:    Generalized anxiety disorder - Plan: sertraline  (ZOLOFT ) 100 MG tablet  Severe recurrent major depression w/psychotic features, mood-congruent (HCC) - Plan: sertraline  (ZOLOFT ) 100 MG tablet, ARIPiprazole  (ABILIFY ) 5 MG tablet  Panic disorder with agoraphobia -- in remission - Plan: sertraline  (ZOLOFT ) 100 MG tablet  Hypersomnia with sleep apnea  Weight gain due to medication Erectile dysfunction due to SSRI  30-minute face to face time with patient  and his daughterwas spent on counseling and coordination of care. We discussed Velinda has a history of severe depression and panic disorder and anxiety that caused him to go out on disability in 2019. He relapsed after retirement off meds. Relapsed again off Abilify  in August 2022 and resolved on 10 mg again .  Ok now on 5 mg daily However he had weight gain .  No EPS now. Relapse worse off meds. He's much too inactive in retirement and disc this again.   Emphasized need to be active in retirment otherwise health will decline including mental health.  Wife busy in wildlife rehab.   He's doing Jeremy [name] with walking.  Continue sertraline  150 mg daily Continue Abilify  5 mg  bc hunger and rubbing teeth together may be SE More paranoid off Abilify . None current.  Disc risk. Disc Good  RX  Discussed potential metabolic side effects associated with atypical antipsychotics, as well as potential risk for movement side effects. Advised pt to contact office if movement side effects occur.  No AIM Disc diet and exercise.      Seeing Dr. JAYSON Salt re: breathing and OSA  Follow-up 6  mos  Lorene Macintosh, MD, DFAPA  Please see After Visit Summary for patient specific instructions.  Future Appointments  Date Time Provider Department Center  06/01/2024  8:40 AM Daryl Setter, NP [facility]  07/04/2024  9:20 AM Pietro Redell RAMAN, MD [facility] None  01/24/2025  2:20 PM [facility] ANNUAL WELLNESS VISIT 1 [facility]    No orders of the defined types were placed in this encounter.      -------------------------------

## 2024-04-17 DIAGNOSIS — M47816 Spondylosis without myelopathy or radiculopathy, lumbar region: Secondary | ICD-10-CM | POA: Diagnosis not present

## 2024-04-24 ENCOUNTER — Telehealth: Payer: Self-pay | Admitting: Family

## 2024-04-24 ENCOUNTER — Other Ambulatory Visit: Payer: Self-pay | Admitting: Family

## 2024-04-24 DIAGNOSIS — R911 Solitary pulmonary nodule: Secondary | ICD-10-CM

## 2024-04-24 NOTE — Telephone Encounter (Signed)
 Please advise pt that he is due for follow up CT to recheck lung nodule. Order placed.

## 2024-04-24 NOTE — Telephone Encounter (Signed)
 Patient notified of this information.

## 2024-05-04 ENCOUNTER — Ambulatory Visit (HOSPITAL_BASED_OUTPATIENT_CLINIC_OR_DEPARTMENT_OTHER)
Admission: RE | Admit: 2024-05-04 | Discharge: 2024-05-04 | Disposition: A | Discharge status: Home / Self Care | Source: Ambulatory Visit | Attending: Family | Admitting: Family

## 2024-05-04 DIAGNOSIS — R911 Solitary pulmonary nodule: Secondary | ICD-10-CM | POA: Diagnosis not present

## 2024-05-04 DIAGNOSIS — I7121 Aneurysm of the ascending aorta, without rupture: Secondary | ICD-10-CM | POA: Diagnosis not present

## 2024-05-10 ENCOUNTER — Telehealth: Payer: Self-pay

## 2024-05-10 NOTE — Telephone Encounter (Signed)
 Copied from CRM [id number]. Topic: Clinical - Lab/Test Results >> May 10, 2024  9:09 AM Jeremy [name] wrote: Reason for CRM: Patient requests a return call for his CT scan results

## 2024-05-10 NOTE — Telephone Encounter (Signed)
 Patient's wife notified results are not ready yet for this study

## 2024-05-10 NOTE — Telephone Encounter (Signed)
 Waiting for results.

## 2024-05-12 ENCOUNTER — Ambulatory Visit: Payer: Self-pay | Admitting: Family

## 2024-05-15 DIAGNOSIS — H5203 Hypermetropia, bilateral: Secondary | ICD-10-CM | POA: Diagnosis not present

## 2024-05-15 DIAGNOSIS — H52222 Regular astigmatism, left eye: Secondary | ICD-10-CM | POA: Diagnosis not present

## 2024-05-15 DIAGNOSIS — H524 Presbyopia: Secondary | ICD-10-CM | POA: Diagnosis not present

## 2024-05-15 DIAGNOSIS — H25813 Combined forms of age-related cataract, bilateral: Secondary | ICD-10-CM | POA: Diagnosis not present

## 2024-05-15 DIAGNOSIS — H35033 Hypertensive retinopathy, bilateral: Secondary | ICD-10-CM | POA: Diagnosis not present

## 2024-05-15 DIAGNOSIS — H35022 Exudative retinopathy, left eye: Secondary | ICD-10-CM | POA: Diagnosis not present

## 2024-05-16 DIAGNOSIS — M47816 Spondylosis without myelopathy or radiculopathy, lumbar region: Secondary | ICD-10-CM | POA: Diagnosis not present

## 2024-06-01 ENCOUNTER — Ambulatory Visit: Admitting: Family

## 2024-06-01 ENCOUNTER — Other Ambulatory Visit (HOSPITAL_BASED_OUTPATIENT_CLINIC_OR_DEPARTMENT_OTHER): Payer: Self-pay

## 2024-06-01 ENCOUNTER — Ambulatory Visit: Payer: Self-pay | Admitting: Family

## 2024-06-01 VITALS — BP 131/71 | HR 70 | Temp 98.8°F | Resp 16 | Ht 66.0 in | Wt 285.0 lb

## 2024-06-01 DIAGNOSIS — Z23 Encounter for immunization: Secondary | ICD-10-CM | POA: Diagnosis not present

## 2024-06-01 DIAGNOSIS — R739 Hyperglycemia, unspecified: Secondary | ICD-10-CM | POA: Diagnosis not present

## 2024-06-01 DIAGNOSIS — I10 Essential (primary) hypertension: Secondary | ICD-10-CM

## 2024-06-01 DIAGNOSIS — R911 Solitary pulmonary nodule: Secondary | ICD-10-CM

## 2024-06-01 DIAGNOSIS — E785 Hyperlipidemia, unspecified: Secondary | ICD-10-CM

## 2024-06-01 DIAGNOSIS — F32A Depression, unspecified: Secondary | ICD-10-CM

## 2024-06-01 DIAGNOSIS — K219 Gastro-esophageal reflux disease without esophagitis: Secondary | ICD-10-CM

## 2024-06-01 DIAGNOSIS — R1319 Other dysphagia: Secondary | ICD-10-CM

## 2024-06-01 DIAGNOSIS — I719 Aortic aneurysm of unspecified site, without rupture: Secondary | ICD-10-CM

## 2024-06-01 DIAGNOSIS — E876 Hypokalemia: Secondary | ICD-10-CM

## 2024-06-01 DIAGNOSIS — Z862 Personal history of diseases of the blood and blood-forming organs and certain disorders involving the immune mechanism: Secondary | ICD-10-CM

## 2024-06-01 DIAGNOSIS — G4733 Obstructive sleep apnea (adult) (pediatric): Secondary | ICD-10-CM

## 2024-06-01 DIAGNOSIS — I5032 Chronic diastolic (congestive) heart failure: Secondary | ICD-10-CM

## 2024-06-01 DIAGNOSIS — F419 Anxiety disorder, unspecified: Secondary | ICD-10-CM

## 2024-06-01 DIAGNOSIS — J309 Allergic rhinitis, unspecified: Secondary | ICD-10-CM

## 2024-06-01 LAB — LIPID PANEL
Cholesterol: 96 mg/dL (ref 0–200)
HDL: 36.7 mg/dL — ABNORMAL LOW (ref >39.00)
LDL Cholesterol: 34 mg/dL (ref 0–99)
NonHDL: 59.72
Total CHOL/HDL Ratio: 3
Triglycerides: 128 mg/dL (ref 0.0–149.0)
VLDL: 25.6 mg/dL (ref 0.0–40.0)

## 2024-06-01 LAB — BASIC METABOLIC PANEL WITH GFR
BUN: 9 mg/dL (ref 6–23)
CO2: 31 meq/L (ref 19–32)
Calcium: 9 mg/dL (ref 8.4–10.5)
Chloride: 102 meq/L (ref 96–112)
Creatinine, Ser: 0.86 mg/dL (ref 0.40–1.50)
GFR: 88.35 mL/min (ref >60.00)
Glucose, Bld: 99 mg/dL (ref 70–99)
Potassium: 4.4 meq/L (ref 3.5–5.1)
Sodium: 141 meq/L (ref 135–145)

## 2024-06-01 LAB — HEMOGLOBIN A1C: Hgb A1c MFr Bld: 6.3 % (ref 4.6–6.5)

## 2024-06-01 MED ORDER — COMIRNATY 30 MCG/0.3ML IM SUSY
0.3000 mL | PREFILLED_SYRINGE | Freq: Once | INTRAMUSCULAR | 0 refills | Status: AC
  Filled 2024-06-01: qty 0.3, 1d supply, fill #0

## 2024-06-01 MED ORDER — FUROSEMIDE 20 MG PO TABS: 20.0000 mg | ORAL_TABLET | Freq: Every day | ORAL | 1 refills | Status: DC

## 2024-06-01 MED ORDER — AMLODIPINE BESY-BENAZEPRIL HCL 10-40 MG PO CAPS: 1.0000 | ORAL_CAPSULE | Freq: Every day | ORAL | 1 refills | Status: DC

## 2024-06-01 MED ORDER — POTASSIUM CHLORIDE ER 10 MEQ PO TBCR: 20.0000 meq | EXTENDED_RELEASE_TABLET | Freq: Every day | ORAL | 1 refills | Status: DC

## 2024-06-01 MED ORDER — EZETIMIBE 10 MG PO TABS: 10.0000 mg | ORAL_TABLET | Freq: Every day | ORAL | 1 refills | Status: DC

## 2024-06-01 MED ORDER — METOPROLOL SUCCINATE ER 50 MG PO TB24
50.0000 mg | ORAL_TABLET | Freq: Every day | ORAL | 1 refills | Status: DC
Start: 2024-06-01 — End: 2024-07-04

## 2024-06-01 NOTE — Assessment & Plan Note (Addendum)
 He was walking but stopped.  Encouraged him to stop soft drinks and restart walking.

## 2024-06-01 NOTE — Assessment & Plan Note (Signed)
-   Continues CPAP  

## 2024-06-01 NOTE — Patient Instructions (Signed)
 VISIT SUMMARY:  Today, you came in for a follow-up on your medications and to discuss weight management. We talked about your recent weight gain, your current medications, and some new strategies to help you manage your weight and overall health. We also reviewed your conditions, including prediabetes, hypertension, high cholesterol, sleep apnea, allergies, heartburn, and mood. Additionally, we updated your immunizations with a flu shot and a COVID-19 booster.  YOUR PLAN:  -OBESITY: You have gained 12 pounds. We discussed weight loss strategies, including a program with a shot and diet plan at a local clinic. Stopping your daily Pepsi consumption and resuming walking could help you lose weight. Consider a referral to [facility] if you are interested.  -PREDIABETES: Your A1c level is 6.2, which means you are prediabetic. This could progress to diabetes if you gain more weight. We will monitor your A1c levels to keep track of this.  -ESSENTIAL HYPERTENSION: Your blood pressure is well-controlled with your current medications (Lotrel, hydrochlorothiazide , and metoprolol ). If you lose a significant amount of weight, we might be able to reduce your medications.  -HYPERLIPIDEMIA: Your cholesterol levels were slightly elevated a year ago. We will update your cholesterol levels today and continue your current medications (Crestor  and Zetia ).  -OBSTRUCTIVE SLEEP APNEA: You are using a CPAP machine for sleep apnea and are compliant with it. No changes are needed at this time.  -ABDOMINAL AORTIC ANEURYSM, WITHOUT RUPTURE: Your aneurysm has not changed in size. We will monitor it with repeat imaging in one year.  -ALLERGIC RHINITIS: You have a runny nose and phlegm in your throat, likely due to allergies. Continue taking Allegra  and add Flonase  nasal spray for additional relief.  -GASTROESOPHAGEAL REFLUX DISEASE: Your heartburn is well-controlled with omeprazole . Continue taking  this medication.  -MAJOR DEPRESSIVE DISORDER: Your mood and anxiety are well-managed with sertraline . Continue taking this medication.  -IMMUNIZATION: You received a flu shot and a COVID-19 booster today to help protect you from these illnesses.  INSTRUCTIONS:  Please follow up with us  to monitor your A1c levels and cholesterol panel. Consider a referral to the [facility] if you are interested in their weight loss program. We will also need to repeat imaging of your abdominal aortic aneurysm in one year.

## 2024-06-01 NOTE — Progress Notes (Signed)
 Subjective:     [name], male    DOB: [birth year], 68 y.o.   MRN: [id number]  Chief Complaint  Patient presents with   Hypertension    Here for follow up   Asthma    Here for follow up    Hypertension  Asthma His past medical history is significant for asthma.    Discussed the use of AI scribe software for clinical note transcription with the patient, who gave verbal consent to proceed.  History of Present Illness  Jeremy [name] is a 68 year old male who presents for medication follow-up and weight management.  He has gained 12 pounds and is considering weight loss options, including a program with a shot and diet plan and a local clinic. He consumes two bottles of Pepsi daily. Hip pain has led him to stop walking, which previously helped his shortness of breath.  He uses a CPAP machine for sleep apnea and is compliant. His shortness of breath improved with walking but worsened after stopping.  He takes Crestor  and Zetia  for high cholesterol. His last cholesterol check a year ago showed slightly elevated triglycerides, and he is due for an updated test today.  He is on Lotrel, hydrochlorothiazide , and metoprolol  for hypertension.  He is borderline diabetic with a recent A1c of 6.2. Further weight gain could lead to a diabetes diagnosis, potentially allowing for insurance coverage of medications like Ozempic.  He takes omeprazole  for heartburn, which is well-controlled, and sertraline  for mood, which is stable. He also takes Allegra  for allergies, which helps with symptoms, though he occasionally experiences a runny nose. He has phlegm in his throat, attributed to post-nasal drip and allergies.     Health Maintenance Due  Topic Date Due   Influenza Vaccine  03/23/2024   COVID-19 Vaccine (7 - 2025-26 season) 04/23/2024    Past Medical History:  Diagnosis Date   Anemia 11/16/2014   Anxiety    Aortic aneurysm    4.1 cm 2019, ascending aorta    Arthritis    Bladder disease    an unusual bladder disease; don't know what it's called (06/30/2015)   Bulging lumbar disc    Cataract    COVID-19 01/09/2021   Depression    GERD (gastroesophageal reflux disease)    History of hiatal hernia    Hyperglycemia 11/16/2014   Hyperlipemia    Hypertension Can't   Remember   Joint pain, knee    Migraine    06/30/2015 maybe 2-3 times/year; not as bad as I used to have them   OSA (obstructive sleep apnea)    Sleep apnea     Past Surgical History:  Procedure Laterality Date   ESOPHAGOGASTRODUODENOSCOPY     ESOPHAGOGASTRODUODENOSCOPY N/A 11/16/2023   Procedure: EGD (ESOPHAGOGASTRODUODENOSCOPY);  Surgeon: Wilhelmenia Aloha Raddle., MD;  Location: [facility];  Service: Gastroenterology;  Laterality: N/A;   ESOPHAGOGASTRODUODENOSCOPY (EGD) WITH ESOPHAGEAL DILATION  ~ 2014   JOINT REPLACEMENT     right knee   KNEE ARTHROSCOPY Right 08/23/1993   NASAL SEPTUM SURGERY  ~ 1972   SAVORY DILATION N/A 11/16/2023   Procedure: EGD, WITH DILATION USING SAVARY-GILLIARD DILATOR OVER GUIDEWIRE;  Surgeon: Mansouraty, Aloha Raddle., MD;  Location: [facility];  Service: Gastroenterology;  Laterality: N/A;   SPINE SURGERY     TOTAL KNEE ARTHROPLASTY  10/11/2011   Procedure: TOTAL KNEE ARTHROPLASTY;  Surgeon: Garnette JONETTA Raman, MD;  Location: [facility];  Service: Orthopedics;  Laterality: Right;  TRANSFORAMINAL LUMBAR INTERBODY FUSION W/ MIS 1 LEVEL Right 03/31/2021   Procedure: Right Lumbar five Sacral one Minimally invasive transforaminal lumbar interbody fusion;  Surgeon: Cheryle Debby LABOR, MD;  Location: [facility];  Service: Neurosurgery;  Laterality: Right;   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)  ~2014   UPPER ESOPHAGEAL ENDOSCOPIC ULTRASOUND (EUS) N/A 11/16/2023   Procedure: UPPER ESOPHAGEAL ENDOSCOPIC ULTRASOUND (EUS);  Surgeon: Wilhelmenia Aloha Raddle., MD;  Location: [facility];  Service: Gastroenterology;  Laterality: N/A;     Family History  Problem Relation Age of Onset   Stroke Mother 77       Died of CVA   Hypertension Mother    Anesthesia problems Neg Hx    Hypotension Neg Hx    Malignant hyperthermia Neg Hx    Pseudochol deficiency Neg Hx    Esophageal cancer Neg Hx    Colon cancer Neg Hx    Stomach cancer Neg Hx    Inflammatory bowel disease Neg Hx    Liver disease Neg Hx    Pancreatic cancer Neg Hx    Rectal cancer Neg Hx     Social History   Socioeconomic History   Marital status: Married    Spouse name: Not on file   Number of children: 3   Years of education: Not on file   Highest education level: GED or equivalent  Occupational History   Not on file  Tobacco Use   Smoking status: Former    Current packs/day: 0.00    Average packs/day: 1.5 packs/day for 6.0 years (9.0 ttl pk-yrs)    Types: Cigarettes    Start date: 12/01/1970    Quit date: 11/30/1976    Years since quitting: 47.5   Smokeless tobacco: Never   Tobacco comments:    stopped smoking in ~ 1990  Vaping Use   Vaping status: Never Used  Substance and Sexual Activity   Alcohol use: No    Alcohol/week: 0.0 standard drinks of alcohol   Drug use: No   Sexual activity: Yes  Other Topics Concern   Not on file  Social History Narrative   3 children    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   Completed 12th grade   Enjoys softball      01/24/24 Fully retired now   Social Drivers of [employer] Strain: Low Risk  (02/15/2024)   Received from [facility]   Overall Financial Resource Strain (CARDIA)    Difficulty of Paying Living Expenses: Not hard at all  Food Insecurity: No Food Insecurity (02/15/2024)   Received from [facility]   Hunger Vital Sign    Within the past 12 months, you worried that your food would run out before you got the money to buy more.: Never true    Within the past 12 months, the food you bought just didn't last and you didn't have money to get  more.: Never true  Transportation Needs: No Transportation Needs (02/15/2024)   Received from [facility] - Transportation    Lack of Transportation (Medical): No    Lack of Transportation (Non-Medical): No  Physical Activity: Insufficiently Active (01/24/2024)   Exercise Vital Sign    Days of Exercise per Week: 3 days    Minutes of Exercise per Session: 30 min  Stress: No Stress Concern Present (01/24/2024)   [employer] of Occupational Health - Occupational Stress Questionnaire    Feeling of Stress :  Not at all  Social Connections: Socially Integrated (01/24/2024)   Social Connection and Isolation Panel    Frequency of Communication with Friends and Family: Once a week    Frequency of Social Gatherings with Friends and Family: Twice a week    Attends Religious Services: More than 4 times per year    Active Member of [employer] or Organizations: Yes    Attends Engineer, structural: More than 4 times per year    Marital Status: Married  Catering manager Violence: Not At Risk (01/24/2024)   Humiliation, Afraid, Rape, and Kick questionnaire    Fear of Current or Ex-Partner: No    Emotionally Abused: No    Physically Abused: No    Sexually Abused: No    Outpatient Medications Prior to Visit  Medication Sig Dispense Refill   ARIPiprazole  (ABILIFY ) 5 MG tablet Take 1 tablet (5 mg total) by mouth daily. 90 tablet 2   ascorbic acid (VITAMIN C) 500 MG tablet Take 500 mg by mouth daily.     aspirin  EC 81 MG tablet Take 1 tablet (81 mg total) by mouth daily. Swallow whole. 90 tablet 3   b complex vitamins tablet Take 1 tablet by mouth daily.     ferrous sulfate  325 (65 FE) MG tablet Take 1 tablet (325 mg total) by mouth 2 (two) times daily with a meal. 60 tablet 11   fexofenadine  (ALLEGRA ) 180 MG tablet Take 1 tablet (180 mg total) by mouth daily. 30 tablet 0   hydrochlorothiazide  (HYDRODIURIL ) 25 MG tablet Take 1 tablet (25 mg total) by mouth daily. 90 tablet 1   Multiple  Vitamin (MULTIVITAMIN WITH MINERALS) TABS tablet Take 1 tablet by mouth daily.     omeprazole  (PRILOSEC) 40 MG capsule Take 1 capsule (40 mg total) by mouth daily. 30 capsule 12   rosuvastatin  (CRESTOR ) 40 MG tablet Take 1 tablet (40 mg total) by mouth daily. 90 tablet 1   sertraline  (ZOLOFT ) 100 MG tablet Take 1.5 tablets (150 mg total) by mouth daily. 135 tablet 2   amLODipine -benazepril  (LOTREL) 10-40 MG capsule Take 1 capsule by mouth once daily 90 capsule 0   ezetimibe  (ZETIA ) 10 MG tablet Take 1 tablet by mouth once daily 90 tablet 0   furosemide  (LASIX ) 20 MG tablet Take 1 tablet (20 mg total) by mouth daily. 90 tablet 1   metoprolol  succinate (TOPROL -XL) 50 MG 24 hr tablet TAKE 1 TABLET BY MOUTH ONCE DAILY WITH MEALS 90 tablet 0   potassium chloride  (KLOR-CON ) 10 MEQ tablet TAKE 2 TABLETS BY MOUTH IN THE MORNING AND 1 TABLET IN THE EVENING 270 tablet 0   No facility-administered medications prior to visit.    Allergies  Allergen Reactions   Quinolones Other (See Comments)    Contraindicated due to presence of thoracic aortic aneurysm.    ROS See HPI    Objective:    Physical Exam Constitutional:      General: He is not in acute distress.    Appearance: He is well-developed.  HENT:     Head: Normocephalic and atraumatic.  Cardiovascular:     Rate and Rhythm: Normal rate and regular rhythm.     Heart sounds: No murmur heard. Pulmonary:     Effort: Pulmonary effort is normal. No respiratory distress.     Breath sounds: Normal breath sounds. No wheezing or rales.  Skin:    General: Skin is warm and dry.  Neurological:     Mental Status: He is alert  and oriented to person, place, and time.  Psychiatric:        Behavior: Behavior normal.        Thought Content: Thought content normal.      BP 131/71 (BP Location: Right Arm, Patient Position: Sitting, Cuff Size: Normal)   Pulse 70   Temp 98.8 F (37.1 C) (Oral)   Resp 16   Ht 5' 6 (1.676 m)   Wt 285 lb (129.3  kg)   SpO2 96%   BMI 46.00 kg/m  Wt Readings from Last 3 Encounters:  06/01/24 285 lb (129.3 kg)  03/22/24 273 lb 9.6 oz (124.1 kg)  02/28/24 270 lb (122.5 kg)       Assessment & Plan:   Problem List Items Addressed This Visit       Unprioritized   OSA (obstructive sleep apnea)   Continues CPAP.       Morbid obesity (HCC)   He was walking but stopped.  Encouraged him to stop soft drinks and restart walking.       RESOLVED: Lung nodule   Resolved on follow up CT.       Hyperlipidemia   Lab Results  Component Value Date   CHOL 119 07/05/2023   HDL 36 (L) 07/05/2023   LDLCALC 51 07/05/2023   LDLDIRECT 129.0 01/24/2023   TRIG 196 (H) 07/05/2023   CHOLHDL 3.3 07/05/2023   Continue zetia  and crestory. Update lipid panel.       Relevant Medications   ezetimibe  (ZETIA ) 10 MG tablet   metoprolol  succinate (TOPROL -XL) 50 MG 24 hr tablet   furosemide  (LASIX ) 20 MG tablet   amLODipine -benazepril  (LOTREL) 10-40 MG capsule   Other Relevant Orders   Lipid panel   Hyperglycemia   Lab Results  Component Value Date   HGBA1C 6.2 11/30/2023   May be up with weight gain. Update A1C.       Relevant Orders   HgB A1c   HTN (hypertension)   BP Readings from Last 3 Encounters:  06/01/24 131/71  03/22/24 134/74  02/28/24 (!) 145/82   Stable on lotrel, hydrochlorothiazide  and metoprol.       Relevant Medications   ezetimibe  (ZETIA ) 10 MG tablet   metoprolol  succinate (TOPROL -XL) 50 MG 24 hr tablet   furosemide  (LASIX ) 20 MG tablet   amLODipine -benazepril  (LOTREL) 10-40 MG capsule   Other Relevant Orders   Basic Metabolic Panel (BMET)   History of anemia   Lab Results  Component Value Date   WBC 7.9 08/31/2023   HGB 13.8 08/31/2023   HCT 41.1 08/31/2023   MCV 84.2 08/31/2023   PLT 279.0 08/31/2023   Last CBC up to date.       GERD (gastroesophageal reflux disease)   Stable on omeprazole . Continue same.      RESOLVED: Esophageal dysphagia   Resolved.       Chronic diastolic heart failure (HCC)   Euvolemic on lasix .       Relevant Medications   ezetimibe  (ZETIA ) 10 MG tablet   metoprolol  succinate (TOPROL -XL) 50 MG 24 hr tablet   furosemide  (LASIX ) 20 MG tablet   amLODipine -benazepril  (LOTREL) 10-40 MG capsule   Aortic aneurysm   Stable 4.3 cm ascending thoracic aortic aneurysm. Recommend 1 year follow up.       Relevant Medications   ezetimibe  (ZETIA ) 10 MG tablet   metoprolol  succinate (TOPROL -XL) 50 MG 24 hr tablet   furosemide  (LASIX ) 20 MG tablet   amLODipine -benazepril  (LOTREL) 10-40 MG capsule  Anxiety and depression   Well controlled on sertraline .       Allergic rhinitis   Using allegra  daily with good relief.       Other Visit Diagnoses       Needs flu shot    -  Primary   Relevant Orders   Flu vaccine HIGH DOSE PF(Fluzone Trivalent)     Dyslipidemia, goal LDL below 70       Relevant Medications   ezetimibe  (ZETIA ) 10 MG tablet   metoprolol  succinate (TOPROL -XL) 50 MG 24 hr tablet   furosemide  (LASIX ) 20 MG tablet   amLODipine -benazepril  (LOTREL) 10-40 MG capsule     Hypokalemia       Relevant Medications   potassium chloride  (KLOR-CON ) 10 MEQ tablet       I have changed Omir S. Stall Tim's ezetimibe , potassium chloride , metoprolol  succinate, and amLODipine -benazepril . I am also having him maintain his b complex vitamins, multivitamin with minerals, fexofenadine , ascorbic acid, aspirin  EC, ferrous sulfate , omeprazole , hydrochlorothiazide , rosuvastatin , sertraline , ARIPiprazole , and furosemide .  Meds ordered this encounter  Medications   ezetimibe  (ZETIA ) 10 MG tablet    Sig: Take 1 tablet (10 mg total) by mouth daily.    Dispense:  90 tablet    Refill:  1    Supervising Provider:   DOMENICA BLACKBIRD A [[id number]]   potassium chloride  (KLOR-CON ) 10 MEQ tablet    Sig: Take 2 tablets (20 mEq total) by mouth daily. with food    Dispense:  270 tablet    Refill:  1    Supervising Provider:   DOMENICA BLACKBIRD A  [[id number]]   metoprolol  succinate (TOPROL -XL) 50 MG 24 hr tablet    Sig: Take 1 tablet (50 mg total) by mouth daily. Take with or immediately following a meal.    Dispense:  90 tablet    Refill:  1    Supervising Provider:   DOMENICA BLACKBIRD A [[id number]]   furosemide  (LASIX ) 20 MG tablet    Sig: Take 1 tablet (20 mg total) by mouth daily.    Dispense:  90 tablet    Refill:  1    Supervising Provider:   DOMENICA BLACKBIRD A [[id number]]   amLODipine -benazepril  (LOTREL) 10-40 MG capsule    Sig: Take 1 capsule by mouth daily.    Dispense:  90 capsule    Refill:  1    Supervising Provider:   DOMENICA BLACKBIRD A [[id number]]

## 2024-06-01 NOTE — Assessment & Plan Note (Signed)
Resolved on follow-up CT

## 2024-06-01 NOTE — Assessment & Plan Note (Signed)
 Lab Results  Component Value Date   CHOL 119 07/05/2023   HDL 36 (L) 07/05/2023   LDLCALC 51 07/05/2023   LDLDIRECT 129.0 01/24/2023   TRIG 196 (H) 07/05/2023   CHOLHDL 3.3 07/05/2023   Continue zetia  and crestory. Update lipid panel.

## 2024-06-01 NOTE — Assessment & Plan Note (Signed)
 Lab Results  Component Value Date   WBC 7.9 08/31/2023   HGB 13.8 08/31/2023   HCT 41.1 08/31/2023   MCV 84.2 08/31/2023   PLT 279.0 08/31/2023   Last CBC up to date.

## 2024-06-01 NOTE — Assessment & Plan Note (Signed)
Stable on omeprazole. Continue same.  ?

## 2024-06-01 NOTE — Assessment & Plan Note (Signed)
 Well controlled on sertraline.

## 2024-06-01 NOTE — Assessment & Plan Note (Signed)
 Lab Results  Component Value Date   HGBA1C 6.2 11/30/2023   May be up with weight gain. Update A1C.

## 2024-06-01 NOTE — Assessment & Plan Note (Signed)
 Stable 4.3 cm ascending thoracic aortic aneurysm. Recommend 1 year follow up.

## 2024-06-01 NOTE — Assessment & Plan Note (Signed)
 Using allegra  daily with good relief.

## 2024-06-01 NOTE — Assessment & Plan Note (Signed)
 BP Readings from Last 3 Encounters:  06/01/24 131/71  03/22/24 134/74  02/28/24 (!) 145/82   Stable on lotrel, hydrochlorothiazide  and metoprol.

## 2024-06-01 NOTE — Assessment & Plan Note (Signed)
 Resolved

## 2024-06-01 NOTE — Assessment & Plan Note (Signed)
Euvolemic on lasix.

## 2024-06-14 DIAGNOSIS — M48062 Spinal stenosis, lumbar region with neurogenic claudication: Secondary | ICD-10-CM | POA: Diagnosis not present

## 2024-06-27 NOTE — Progress Notes (Signed)
 HPI: FU CAD and TAA. Echocardiogram June 2024 showed normal LV function, mild left ventricular hypertrophy, dilated ascending aorta at 43 mm and trace aortic insufficiency. CTA 7/24 showed Ca score 6 (21 percentile), mild stenosis in the LAD and mildly dilated aortic root (4.3 cm). CTA 6/25 showed mildly dilated thoracic aorta (4.3 cm) and lung nodule. Noncontrast chest CT 9/25 showed 4.3 cm TAA.  Since last seen, patient has dyspnea with more vigorous activities but not routine activities.  No orthopnea, PND, syncope.  Occasional pain in his left chest unchanged and not related to activities.  Short-lived.  Current Outpatient Medications  Medication Sig Dispense Refill   amLODipine -benazepril  (LOTREL) 10-40 MG capsule Take 1 capsule by mouth daily. 90 capsule 1   ARIPiprazole  (ABILIFY ) 5 MG tablet Take 1 tablet (5 mg total) by mouth daily. 90 tablet 2   ascorbic acid (VITAMIN C) 500 MG tablet Take 500 mg by mouth daily.     aspirin  EC 81 MG tablet Take 1 tablet (81 mg total) by mouth daily. Swallow whole. 90 tablet 3   b complex vitamins tablet Take 1 tablet by mouth daily.     ezetimibe  (ZETIA ) 10 MG tablet Take 1 tablet (10 mg total) by mouth daily. 90 tablet 1   ferrous sulfate  325 (65 FE) MG tablet Take 1 tablet (325 mg total) by mouth 2 (two) times daily with a meal. 60 tablet 11   fexofenadine  (ALLEGRA ) 180 MG tablet Take 1 tablet (180 mg total) by mouth daily. 30 tablet 0   furosemide  (LASIX ) 20 MG tablet Take 1 tablet (20 mg total) by mouth daily. 90 tablet 1   hydrochlorothiazide  (HYDRODIURIL ) 25 MG tablet Take 1 tablet (25 mg total) by mouth daily. 90 tablet 1   metoprolol  succinate (TOPROL -XL) 50 MG 24 hr tablet Take 1 tablet (50 mg total) by mouth daily. Take with or immediately following a meal. 90 tablet 1   Multiple Vitamin (MULTIVITAMIN WITH MINERALS) TABS tablet Take 1 tablet by mouth daily.     omeprazole  (PRILOSEC) 40 MG capsule Take 1 capsule (40 mg total) by mouth daily.  30 capsule 12   potassium chloride  (KLOR-CON ) 10 MEQ tablet Take 2 tablets (20 mEq total) by mouth daily. with food 270 tablet 1   rosuvastatin  (CRESTOR ) 40 MG tablet Take 1 tablet (40 mg total) by mouth daily. 90 tablet 1   sertraline  (ZOLOFT ) 100 MG tablet Take 1.5 tablets (150 mg total) by mouth daily. 135 tablet 2   No current facility-administered medications for this visit.    Allergies  Allergen Reactions   Quinolones Other (See Comments)    Contraindicated due to presence of thoracic aortic aneurysm.     Past Medical History:  Diagnosis Date   Anemia 11/16/2014   Anxiety    Aortic aneurysm    4.1 cm 2019, ascending aorta   Arthritis    Bladder disease    an unusual bladder disease; don't know what it's called (06/30/2015)   Bulging lumbar disc    Cataract    COVID-19 01/09/2021   Depression    GERD (gastroesophageal reflux disease)    History of hiatal hernia    Hyperglycemia 11/16/2014   Hyperlipemia    Hypertension Can't   Remember   Joint pain, knee    Migraine    06/30/2015 maybe 2-3 times/year; not as bad as I used to have them   OSA (obstructive sleep apnea)    Sleep apnea  Past Surgical History:  Procedure Laterality Date   ESOPHAGOGASTRODUODENOSCOPY     ESOPHAGOGASTRODUODENOSCOPY N/A 11/16/2023   Procedure: EGD (ESOPHAGOGASTRODUODENOSCOPY);  Surgeon: Wilhelmenia Aloha Raddle., MD;  Location: [facility];  Service: Gastroenterology;  Laterality: N/A;   ESOPHAGOGASTRODUODENOSCOPY (EGD) WITH ESOPHAGEAL DILATION  ~ 2014   JOINT REPLACEMENT     right knee   KNEE ARTHROSCOPY Right 08/23/1993   NASAL SEPTUM SURGERY  ~ 1972   SAVORY DILATION N/A 11/16/2023   Procedure: EGD, WITH DILATION USING SAVARY-GILLIARD DILATOR OVER GUIDEWIRE;  Surgeon: Mansouraty, Aloha Raddle., MD;  Location: [facility];  Service: Gastroenterology;  Laterality: N/A;   SPINE SURGERY     TOTAL KNEE ARTHROPLASTY  10/11/2011   Procedure: TOTAL KNEE ARTHROPLASTY;  Surgeon: Garnette JONETTA Raman, MD;  Location: [facility];  Service: Orthopedics;  Laterality: Right;   TRANSFORAMINAL LUMBAR INTERBODY FUSION W/ MIS 1 LEVEL Right 03/31/2021   Procedure: Right Lumbar five Sacral one Minimally invasive transforaminal lumbar interbody fusion;  Surgeon: Cheryle Debby LABOR, MD;  Location: [facility];  Service: Neurosurgery;  Laterality: Right;   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)  ~2014   UPPER ESOPHAGEAL ENDOSCOPIC ULTRASOUND (EUS) N/A 11/16/2023   Procedure: UPPER ESOPHAGEAL ENDOSCOPIC ULTRASOUND (EUS);  Surgeon: Wilhelmenia Aloha Raddle., MD;  Location: [facility];  Service: Gastroenterology;  Laterality: N/A;    Social History   Socioeconomic History   Marital status: Married    Spouse name: Not on file   Number of children: 3   Years of education: Not on file   Highest education level: GED or equivalent  Occupational History   Not on file  Tobacco Use   Smoking status: Former    Current packs/day: 0.00    Average packs/day: 1.5 packs/day for 6.0 years (9.0 ttl pk-yrs)    Types: Cigarettes    Start date: 12/01/1970    Quit date: 11/30/1976    Years since quitting: 47.6   Smokeless tobacco: Never   Tobacco comments:    stopped smoking in ~ 1990  Vaping Use   Vaping status: Never Used  Substance and Sexual Activity   Alcohol use: No    Alcohol/week: 0.0 standard drinks of alcohol   Drug use: No   Sexual activity: Yes  Other Topics Concern   Not on file  Social History Narrative   3 children    Naval architect   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)   Married   Completed 12th grade   Enjoys softball      01/24/24 Fully retired now   Social Drivers of [employer] Strain: Low Risk  (02/15/2024)   Received from [facility]   Overall Financial Resource Strain (CARDIA)    Difficulty of Paying Living Expenses: Not hard at all  Food Insecurity: No Food Insecurity (02/15/2024)   Received from [facility]   Hunger Vital Sign     Within the past 12 months, you worried that your food would run out before you got the money to buy more.: Never true    Within the past 12 months, the food you bought just didn't last and you didn't have money to get more.: Never true  Transportation Needs: No Transportation Needs (02/15/2024)   Received from [facility] - Transportation    Lack of Transportation (Medical): No    Lack of Transportation (Non-Medical): No  Physical Activity: Insufficiently Active (01/24/2024)   Exercise Vital Sign    Days of Exercise per Week: 3  days    Minutes of Exercise per Session: 30 min  Stress: No Stress Concern Present (01/24/2024)   [employer] of Occupational [employer] - Occupational Stress Questionnaire    Feeling of Stress : Not at all  Social Connections: Socially Integrated (01/24/2024)   Social Connection and Isolation Panel    Frequency of Communication with Friends and Family: Once a week    Frequency of Social Gatherings with Friends and Family: Twice a week    Attends Religious Services: More than 4 times per year    Active Member of [employer] or Organizations: Yes    Attends Engineer, Structural: More than 4 times per year    Marital Status: Married  Catering Manager Violence: Not At Risk (01/24/2024)   Humiliation, Afraid, Rape, and Kick questionnaire    Fear of Current or Ex-Partner: No    Emotionally Abused: No    Physically Abused: No    Sexually Abused: No    Family History  Problem Relation Age of Onset   Stroke Mother 3       Died of CVA   Hypertension Mother    Anesthesia problems Neg Hx    Hypotension Neg Hx    Malignant hyperthermia Neg Hx    Pseudochol deficiency Neg Hx    Esophageal cancer Neg Hx    Colon cancer Neg Hx    Stomach cancer Neg Hx    Inflammatory bowel disease Neg Hx    Liver disease Neg Hx    Pancreatic cancer Neg Hx    Rectal cancer Neg Hx     ROS: Back pain but no fevers or chills, productive cough, hemoptysis, dysphasia,  odynophagia, melena, hematochezia, dysuria, hematuria, rash, seizure activity, orthopnea, PND, pedal edema, claudication. Remaining systems are negative.  Physical Exam:   Blood pressure 133/82, pulse 73, height 5' 6 (1.676 m), weight 277 lb (125.6 kg), SpO2 98%.  General:  Well developed/obese in NAD Skin warm/dry Patient not depressed No peripheral clubbing Back-normal HEENT-normal/normal eyelids Neck supple/normal carotid upstroke bilaterally; no bruits; no JVD; no thyromegaly chest - CTA/ normal expansion CV - RRR/normal S1 and S2; no murmurs, rubs or gallops;  PMI nondisplaced Abdomen -NT/ND, no HSM, no mass, + bowel sounds, no bruit 2+ femoral pulses, no bruits Ext-no edema, chords, 2+ DP Neuro-grossly nonfocal  ECG -normal sinus rhythm, no significant ST changes.  Personally reviewed  A/P  1 dyspnea on exertion-LV function normal and mild CAD noted on CTA; possibly related to OSA, obesity and deconditioning.   2 hypertension-BP controlled; continue present meds and follow.   3 chronic diastolic congestive heart failure-continue diuretic at present dose.  4 morbid obesity-we discussed the importance of weight loss.  5 hyperlipidemia-continue crestor  and zetia .   6 obstructive sleep apnea-continue CPAP.  7 TAA-FU CTA 9/26.  8 CAD-mild on recent CTA; continue ASA and statin.   Jeremy Shallow, MD

## 2024-07-04 ENCOUNTER — Encounter: Payer: Self-pay | Admitting: Cardiology

## 2024-07-04 ENCOUNTER — Other Ambulatory Visit: Payer: Self-pay | Admitting: Family

## 2024-07-04 ENCOUNTER — Ambulatory Visit: Attending: Cardiology | Admitting: Cardiology

## 2024-07-04 VITALS — BP 133/82 | HR 73 | Ht 66.0 in | Wt 277.0 lb

## 2024-07-04 DIAGNOSIS — R931 Abnormal findings on diagnostic imaging of heart and coronary circulation: Secondary | ICD-10-CM

## 2024-07-04 DIAGNOSIS — I10 Essential (primary) hypertension: Secondary | ICD-10-CM | POA: Diagnosis not present

## 2024-07-04 DIAGNOSIS — I5032 Chronic diastolic (congestive) heart failure: Secondary | ICD-10-CM | POA: Diagnosis not present

## 2024-07-04 DIAGNOSIS — E785 Hyperlipidemia, unspecified: Secondary | ICD-10-CM | POA: Diagnosis not present

## 2024-07-04 DIAGNOSIS — I7121 Aneurysm of the ascending aorta, without rupture: Secondary | ICD-10-CM

## 2024-07-04 MED ORDER — EZETIMIBE 10 MG PO TABS: 10.0000 mg | ORAL_TABLET | Freq: Every day | ORAL | 3 refills | Status: AC

## 2024-07-04 NOTE — Patient Instructions (Signed)

## 2024-07-04 NOTE — Telephone Encounter (Signed)
 Copied from CRM [id number]. Topic: Clinical - Medication Refill >> Jul 04, 2024 10:25 AM Pinkey ORN wrote: Medication: amLODipine -benazepril  (LOTREL) 10-40 MG capsule, metoprolol  succinate (TOPROL -XL) 50 MG 24 hr tablet  Has the patient contacted their pharmacy? Yes (Agent: If no, request that the patient contact the pharmacy for the refill. If patient does not wish to contact the pharmacy document the reason why and proceed with request.) (Agent: If yes, when and what did the pharmacy advise?)  This is the patient's preferred pharmacy:  [id number] - [place], [place] - [address] [place] [address] Phone: [phone] Fax: [phone]  Is this the correct pharmacy for this prescription? Yes If no, delete pharmacy and type the correct one.   Has the prescription been filled recently? No  Is the patient out of the medication? No  Has the patient been seen for an appointment in the last year OR does the patient have an upcoming appointment? Yes  Can we respond through MyChart? Yes  Agent: Please be advised that Rx refills may take up to 3 business days. We ask that you follow-up with your pharmacy.

## 2024-07-05 MED ORDER — METOPROLOL SUCCINATE ER 50 MG PO TB24: 50.0000 mg | ORAL_TABLET | Freq: Every day | ORAL | 1 refills | Status: AC

## 2024-07-05 MED ORDER — AMLODIPINE BESY-BENAZEPRIL HCL 10-40 MG PO CAPS: 1.0000 | ORAL_CAPSULE | Freq: Every day | ORAL | 1 refills | Status: AC

## 2024-07-11 DIAGNOSIS — G8929 Other chronic pain: Secondary | ICD-10-CM | POA: Diagnosis not present

## 2024-07-11 DIAGNOSIS — M47816 Spondylosis without myelopathy or radiculopathy, lumbar region: Secondary | ICD-10-CM | POA: Diagnosis not present

## 2024-07-11 DIAGNOSIS — M545 Low back pain, unspecified: Secondary | ICD-10-CM | POA: Diagnosis not present

## 2024-07-13 ENCOUNTER — Other Ambulatory Visit: Payer: Self-pay | Admitting: Family

## 2024-08-15 ENCOUNTER — Other Ambulatory Visit: Payer: Self-pay | Admitting: Family

## 2024-08-15 DIAGNOSIS — E876 Hypokalemia: Secondary | ICD-10-CM

## 2024-08-15 DIAGNOSIS — I5032 Chronic diastolic (congestive) heart failure: Secondary | ICD-10-CM

## 2024-08-15 MED ORDER — POTASSIUM CHLORIDE ER 10 MEQ PO TBCR: 20.0000 meq | EXTENDED_RELEASE_TABLET | Freq: Every day | ORAL | 1 refills | Status: AC

## 2024-08-15 MED ORDER — FUROSEMIDE 20 MG PO TABS: 20.0000 mg | ORAL_TABLET | Freq: Every day | ORAL | 1 refills | Status: DC

## 2024-08-15 NOTE — Telephone Encounter (Signed)
 Copied from CRM #[id number]. Topic: Clinical - Medication Refill >> Aug 15, 2024 11:24 AM Burnard DEL wrote: Medication: potassium chloride  (KLOR-CON ) 10 MEQ tablet  furosemide  (LASIX ) 20 MG tablet    Has the patient contacted their pharmacy? No (Agent: If no, request that the patient contact the pharmacy for the refill. If patient does not wish to contact the pharmacy document the reason why and proceed with request.) (Agent: If yes, when and what did the pharmacy advise?)  This is the patient's preferred pharmacy:  [facility] - [place], [place] - [address] [place] [address] Phone: [phone] Fax: [phone]    Is this the correct pharmacy for this prescription? Yes If no, delete pharmacy and type the correct one.   Has the prescription been filled recently? No  Is the patient out of the medication? No(few days left)  Has the patient been seen for an appointment in the last year OR does the patient have an upcoming appointment? Yes  Can we respond through MyChart? Yes  Agent: Please be advised that Rx refills may take up to 3 business days. We ask that you follow-up with your pharmacy.

## 2024-08-28 ENCOUNTER — Other Ambulatory Visit: Payer: Self-pay | Admitting: Cardiology

## 2024-08-28 DIAGNOSIS — R931 Abnormal findings on diagnostic imaging of heart and coronary circulation: Secondary | ICD-10-CM

## 2024-09-07 ENCOUNTER — Other Ambulatory Visit: Payer: Self-pay | Admitting: Family

## 2024-09-07 DIAGNOSIS — I5032 Chronic diastolic (congestive) heart failure: Secondary | ICD-10-CM

## 2024-09-07 MED ORDER — FUROSEMIDE 20 MG PO TABS: 20.0000 mg | ORAL_TABLET | Freq: Every day | ORAL | 1 refills | Status: AC

## 2024-09-07 NOTE — Telephone Encounter (Signed)
 Copied from CRM [id number]. Topic: Clinical - Medication Refill >> Sep 07, 2024  9:59 AM Montie POUR wrote: Medication:  furosemide  (LASIX ) 20 MG tablet   Has the patient contacted their pharmacy? Yes (Agent: If no, request that the patient contact the pharmacy for the refill. If patient does not wish to contact the pharmacy document the reason why and proceed with request.) (Agent: If yes, when and what did the pharmacy advise?) Pharmacy needs order to refill  This is the patient's preferred pharmacy:  [facility] - [place], [place] - [address] [place] [address] Phone: [phone] Fax: [phone]  Is this the correct pharmacy for this prescription? Yes If no, delete pharmacy and type the correct one.   Has the prescription been filled recently? No  Is the patient out of the medication? No  Has the patient been seen for an appointment in the last year OR does the patient have an upcoming appointment? Yes  Can we respond through MyChart? No  Agent: Please be advised that Rx refills may take up to 3 business days. We ask that you follow-up with your pharmacy.

## 2024-09-20 ENCOUNTER — Other Ambulatory Visit: Payer: Self-pay | Admitting: Gastroenterology

## 2024-10-09 ENCOUNTER — Ambulatory Visit: Payer: Medicare Other | Admitting: Internal Medicine

## 2025-01-08 ENCOUNTER — Ambulatory Visit: Admitting: Psychiatry

## 2025-01-24 ENCOUNTER — Ambulatory Visit
